# Patient Record
Sex: Male | Born: 1945 | Race: White | Hispanic: No | Marital: Married | Smoking: Current every day smoker
Health system: Southern US, Community
[De-identification: ages and names within clinical notes are randomized; demographics above are authoritative.]

## PROBLEM LIST (undated history)

## (undated) DIAGNOSIS — Z79891 Long term (current) use of opiate analgesic: Secondary | ICD-10-CM

## (undated) DIAGNOSIS — I209 Angina pectoris, unspecified: Secondary | ICD-10-CM

## (undated) DIAGNOSIS — I509 Heart failure, unspecified: Secondary | ICD-10-CM

## (undated) DIAGNOSIS — R0600 Dyspnea, unspecified: Secondary | ICD-10-CM

## (undated) DIAGNOSIS — D126 Benign neoplasm of colon, unspecified: Secondary | ICD-10-CM

## (undated) DIAGNOSIS — K648 Other hemorrhoids: Secondary | ICD-10-CM

## (undated) DIAGNOSIS — I700 Atherosclerosis of aorta: Secondary | ICD-10-CM

## (undated) DIAGNOSIS — J986 Disorders of diaphragm: Secondary | ICD-10-CM

## (undated) DIAGNOSIS — E039 Hypothyroidism, unspecified: Secondary | ICD-10-CM

## (undated) DIAGNOSIS — F329 Major depressive disorder, single episode, unspecified: Secondary | ICD-10-CM

## (undated) DIAGNOSIS — J45909 Unspecified asthma, uncomplicated: Secondary | ICD-10-CM

## (undated) DIAGNOSIS — K219 Gastro-esophageal reflux disease without esophagitis: Secondary | ICD-10-CM

## (undated) DIAGNOSIS — M064 Inflammatory polyarthropathy: Secondary | ICD-10-CM

## (undated) DIAGNOSIS — T402X5A Adverse effect of other opioids, initial encounter: Secondary | ICD-10-CM

## (undated) DIAGNOSIS — E785 Hyperlipidemia, unspecified: Secondary | ICD-10-CM

## (undated) DIAGNOSIS — G4730 Sleep apnea, unspecified: Secondary | ICD-10-CM

## (undated) DIAGNOSIS — E119 Type 2 diabetes mellitus without complications: Secondary | ICD-10-CM

## (undated) DIAGNOSIS — K5903 Drug induced constipation: Secondary | ICD-10-CM

## (undated) DIAGNOSIS — F419 Anxiety disorder, unspecified: Secondary | ICD-10-CM

## (undated) DIAGNOSIS — R52 Pain, unspecified: Secondary | ICD-10-CM

## (undated) DIAGNOSIS — N210 Calculus in bladder: Secondary | ICD-10-CM

## (undated) DIAGNOSIS — M810 Age-related osteoporosis without current pathological fracture: Secondary | ICD-10-CM

## (undated) DIAGNOSIS — M1990 Unspecified osteoarthritis, unspecified site: Secondary | ICD-10-CM

## (undated) DIAGNOSIS — J449 Chronic obstructive pulmonary disease, unspecified: Secondary | ICD-10-CM

## (undated) DIAGNOSIS — G479 Sleep disorder, unspecified: Secondary | ICD-10-CM

## (undated) DIAGNOSIS — I2510 Atherosclerotic heart disease of native coronary artery without angina pectoris: Secondary | ICD-10-CM

## (undated) DIAGNOSIS — I480 Paroxysmal atrial fibrillation: Secondary | ICD-10-CM

## (undated) DIAGNOSIS — I82403 Acute embolism and thrombosis of unspecified deep veins of lower extremity, bilateral: Secondary | ICD-10-CM

## (undated) DIAGNOSIS — C801 Malignant (primary) neoplasm, unspecified: Secondary | ICD-10-CM

## (undated) DIAGNOSIS — G629 Polyneuropathy, unspecified: Secondary | ICD-10-CM

## (undated) DIAGNOSIS — Z95828 Presence of other vascular implants and grafts: Secondary | ICD-10-CM

## (undated) DIAGNOSIS — M545 Low back pain, unspecified: Secondary | ICD-10-CM

## (undated) DIAGNOSIS — I499 Cardiac arrhythmia, unspecified: Secondary | ICD-10-CM

## (undated) DIAGNOSIS — Z8719 Personal history of other diseases of the digestive system: Secondary | ICD-10-CM

## (undated) DIAGNOSIS — G894 Chronic pain syndrome: Secondary | ICD-10-CM

## (undated) DIAGNOSIS — I739 Peripheral vascular disease, unspecified: Secondary | ICD-10-CM

## (undated) DIAGNOSIS — F32A Depression, unspecified: Secondary | ICD-10-CM

## (undated) DIAGNOSIS — G8929 Other chronic pain: Secondary | ICD-10-CM

## (undated) DIAGNOSIS — D692 Other nonthrombocytopenic purpura: Secondary | ICD-10-CM

## (undated) DIAGNOSIS — G4733 Obstructive sleep apnea (adult) (pediatric): Secondary | ICD-10-CM

## (undated) DIAGNOSIS — R001 Bradycardia, unspecified: Secondary | ICD-10-CM

## (undated) DIAGNOSIS — I10 Essential (primary) hypertension: Secondary | ICD-10-CM

## (undated) DIAGNOSIS — Z77098 Contact with and (suspected) exposure to other hazardous, chiefly nonmedicinal, chemicals: Secondary | ICD-10-CM

## (undated) DIAGNOSIS — I872 Venous insufficiency (chronic) (peripheral): Secondary | ICD-10-CM

## (undated) HISTORY — PX: INFUSION PUMP REPLACEMENT: SHX1825

## (undated) HISTORY — PX: CORONARY ANGIOPLASTY: SHX604

## (undated) HISTORY — PX: BLADDER TUMOR EXCISION: SHX238

## (undated) HISTORY — PX: COLON SURGERY: SHX602

## (undated) HISTORY — PX: BACK SURGERY: SHX140

## (undated) HISTORY — PX: MASTOIDECTOMY: SUR855

## (undated) HISTORY — PX: EXTERNAL EAR SURGERY: SHX627

## (undated) HISTORY — PX: PARTIAL COLECTOMY: SHX5273

## (undated) HISTORY — PX: INTRATHECAL PUMP IMPLANTATION: SHX1844

## (undated) HISTORY — PX: SKIN CANCER EXCISION: SHX779

## (undated) HISTORY — PX: CERVICAL FUSION: SHX112

## (undated) HISTORY — PX: NISSEN FUNDOPLICATION: SHX2091

## (undated) SURGERY — Surgical Case
Anesthesia: *Unknown

---
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
## 1982-07-22 HISTORY — PX: POSTERIOR LAMINECTOMY / DECOMPRESSION CERVICAL SPINE: SUR739

## 1983-07-23 HISTORY — PX: POSTERIOR LAMINECTOMY / DECOMPRESSION CERVICAL SPINE: SUR739

## 1987-04-18 HISTORY — PX: LEFT HEART CATH AND CORONARY ANGIOGRAPHY: CATH118249

## 1994-10-15 HISTORY — PX: COLONOSCOPY: SHX174

## 1997-07-22 HISTORY — PX: POSTERIOR LUMBAR FUSION: SHX6036

## 1998-07-22 DIAGNOSIS — I2109 ST elevation (STEMI) myocardial infarction involving other coronary artery of anterior wall: Secondary | ICD-10-CM

## 1998-07-22 DIAGNOSIS — I219 Acute myocardial infarction, unspecified: Secondary | ICD-10-CM

## 1998-07-22 HISTORY — PX: NISSEN FUNDOPLICATION: SHX2091

## 1998-07-22 HISTORY — DX: ST elevation (STEMI) myocardial infarction involving other coronary artery of anterior wall: I21.09

## 1998-07-22 HISTORY — DX: Acute myocardial infarction, unspecified: I21.9

## 1999-07-23 DIAGNOSIS — E119 Type 2 diabetes mellitus without complications: Secondary | ICD-10-CM | POA: Insufficient documentation

## 1999-07-23 DIAGNOSIS — E039 Hypothyroidism, unspecified: Secondary | ICD-10-CM | POA: Insufficient documentation

## 2002-02-03 HISTORY — PX: LEFT HEART CATH AND CORONARY ANGIOGRAPHY: CATH118249

## 2003-08-12 ENCOUNTER — Other Ambulatory Visit: Payer: Self-pay

## 2003-08-25 HISTORY — PX: COLONOSCOPY: SHX174

## 2004-05-01 ENCOUNTER — Ambulatory Visit: Payer: Self-pay | Admitting: Pain Medicine

## 2004-07-03 ENCOUNTER — Ambulatory Visit: Payer: Self-pay | Admitting: Pain Medicine

## 2004-07-19 ENCOUNTER — Ambulatory Visit: Payer: Self-pay | Admitting: Physician Assistant

## 2004-07-22 DIAGNOSIS — M6282 Rhabdomyolysis: Secondary | ICD-10-CM

## 2004-07-22 HISTORY — DX: Rhabdomyolysis: M62.82

## 2004-08-06 ENCOUNTER — Ambulatory Visit: Payer: Self-pay | Admitting: Physician Assistant

## 2004-08-29 ENCOUNTER — Ambulatory Visit: Payer: Self-pay | Admitting: Physician Assistant

## 2004-09-20 ENCOUNTER — Ambulatory Visit: Payer: Self-pay | Admitting: Pain Medicine

## 2004-10-08 ENCOUNTER — Ambulatory Visit: Payer: Self-pay | Admitting: Physician Assistant

## 2004-11-22 ENCOUNTER — Ambulatory Visit: Payer: Self-pay | Admitting: Pain Medicine

## 2004-12-03 ENCOUNTER — Other Ambulatory Visit: Payer: Self-pay

## 2004-12-03 ENCOUNTER — Inpatient Hospital Stay: Payer: Self-pay | Admitting: Internal Medicine

## 2004-12-03 DIAGNOSIS — I214 Non-ST elevation (NSTEMI) myocardial infarction: Secondary | ICD-10-CM

## 2004-12-03 HISTORY — DX: Non-ST elevation (NSTEMI) myocardial infarction: I21.4

## 2004-12-04 ENCOUNTER — Other Ambulatory Visit: Payer: Self-pay

## 2004-12-04 DIAGNOSIS — I4729 Other ventricular tachycardia: Secondary | ICD-10-CM

## 2004-12-04 HISTORY — PX: CORONARY ANGIOPLASTY WITH STENT PLACEMENT: SHX49

## 2004-12-04 HISTORY — DX: Other ventricular tachycardia: I47.29

## 2005-01-11 ENCOUNTER — Ambulatory Visit: Payer: Self-pay | Admitting: Gastroenterology

## 2005-02-05 ENCOUNTER — Ambulatory Visit: Payer: Self-pay | Admitting: Pain Medicine

## 2005-02-08 ENCOUNTER — Ambulatory Visit: Payer: Self-pay | Admitting: Physician Assistant

## 2005-02-28 ENCOUNTER — Ambulatory Visit: Payer: Self-pay | Admitting: Physician Assistant

## 2005-06-05 ENCOUNTER — Ambulatory Visit: Payer: Self-pay | Admitting: Physician Assistant

## 2005-06-06 ENCOUNTER — Ambulatory Visit: Payer: Self-pay | Admitting: Pain Medicine

## 2005-07-03 ENCOUNTER — Ambulatory Visit: Payer: Self-pay | Admitting: Physician Assistant

## 2005-08-01 ENCOUNTER — Ambulatory Visit: Payer: Self-pay | Admitting: Physician Assistant

## 2005-08-13 ENCOUNTER — Ambulatory Visit: Payer: Self-pay | Admitting: Pain Medicine

## 2005-10-10 ENCOUNTER — Ambulatory Visit: Payer: Self-pay | Admitting: Pain Medicine

## 2005-10-15 ENCOUNTER — Ambulatory Visit: Payer: Self-pay | Admitting: Physician Assistant

## 2005-10-21 ENCOUNTER — Ambulatory Visit: Payer: Self-pay | Admitting: Physician Assistant

## 2005-10-24 ENCOUNTER — Ambulatory Visit: Payer: Self-pay | Admitting: Physician Assistant

## 2005-10-29 ENCOUNTER — Ambulatory Visit: Payer: Self-pay | Admitting: Pain Medicine

## 2005-11-11 ENCOUNTER — Ambulatory Visit: Payer: Self-pay | Admitting: Pain Medicine

## 2005-11-12 ENCOUNTER — Ambulatory Visit: Payer: Self-pay | Admitting: Pain Medicine

## 2005-11-14 ENCOUNTER — Ambulatory Visit: Payer: Self-pay | Admitting: Pain Medicine

## 2005-11-28 ENCOUNTER — Ambulatory Visit: Payer: Self-pay | Admitting: Pain Medicine

## 2005-12-12 ENCOUNTER — Ambulatory Visit: Payer: Self-pay | Admitting: Pain Medicine

## 2005-12-23 ENCOUNTER — Ambulatory Visit: Payer: Self-pay | Admitting: Pain Medicine

## 2006-01-06 ENCOUNTER — Ambulatory Visit: Payer: Self-pay | Admitting: Pain Medicine

## 2006-01-14 ENCOUNTER — Ambulatory Visit: Payer: Self-pay | Admitting: Pain Medicine

## 2006-01-31 ENCOUNTER — Ambulatory Visit: Payer: Self-pay | Admitting: Pain Medicine

## 2006-03-03 ENCOUNTER — Ambulatory Visit: Payer: Self-pay | Admitting: Pain Medicine

## 2006-03-06 ENCOUNTER — Ambulatory Visit: Payer: Self-pay | Admitting: Pain Medicine

## 2006-04-17 ENCOUNTER — Other Ambulatory Visit: Payer: Self-pay

## 2006-04-17 ENCOUNTER — Inpatient Hospital Stay: Payer: Self-pay | Admitting: Internal Medicine

## 2006-04-30 ENCOUNTER — Ambulatory Visit: Payer: Self-pay | Admitting: Pain Medicine

## 2006-05-20 ENCOUNTER — Ambulatory Visit: Payer: Self-pay | Admitting: Pain Medicine

## 2006-07-02 ENCOUNTER — Ambulatory Visit: Payer: Self-pay | Admitting: Pain Medicine

## 2006-07-04 ENCOUNTER — Other Ambulatory Visit: Payer: Self-pay

## 2006-07-04 ENCOUNTER — Inpatient Hospital Stay: Payer: Self-pay | Admitting: Internal Medicine

## 2006-07-31 ENCOUNTER — Ambulatory Visit: Payer: Self-pay | Admitting: Pain Medicine

## 2006-09-15 ENCOUNTER — Ambulatory Visit: Payer: Self-pay | Admitting: Pain Medicine

## 2006-09-16 ENCOUNTER — Ambulatory Visit: Payer: Self-pay | Admitting: Pain Medicine

## 2006-09-18 ENCOUNTER — Ambulatory Visit: Payer: Self-pay | Admitting: Pain Medicine

## 2006-10-06 ENCOUNTER — Ambulatory Visit: Payer: Self-pay | Admitting: Pain Medicine

## 2006-10-20 ENCOUNTER — Emergency Department: Payer: Self-pay | Admitting: Emergency Medicine

## 2006-10-20 ENCOUNTER — Other Ambulatory Visit: Payer: Self-pay

## 2006-11-11 ENCOUNTER — Ambulatory Visit: Payer: Self-pay | Admitting: Pain Medicine

## 2006-12-08 ENCOUNTER — Ambulatory Visit: Payer: Self-pay | Admitting: Pain Medicine

## 2007-01-10 ENCOUNTER — Inpatient Hospital Stay: Payer: Self-pay | Admitting: Internal Medicine

## 2007-01-10 ENCOUNTER — Other Ambulatory Visit: Payer: Self-pay

## 2007-01-15 ENCOUNTER — Ambulatory Visit: Payer: Self-pay | Admitting: Pain Medicine

## 2007-02-09 ENCOUNTER — Ambulatory Visit: Payer: Self-pay | Admitting: Pain Medicine

## 2007-04-07 ENCOUNTER — Ambulatory Visit: Payer: Self-pay | Admitting: Pain Medicine

## 2007-06-08 ENCOUNTER — Inpatient Hospital Stay: Payer: Self-pay | Admitting: Pain Medicine

## 2007-07-07 ENCOUNTER — Ambulatory Visit: Payer: Self-pay | Admitting: Pain Medicine

## 2007-08-13 ENCOUNTER — Emergency Department: Payer: Self-pay | Admitting: Emergency Medicine

## 2007-08-19 ENCOUNTER — Ambulatory Visit: Payer: Self-pay | Admitting: Pain Medicine

## 2007-09-01 ENCOUNTER — Ambulatory Visit: Payer: Self-pay | Admitting: Pain Medicine

## 2007-09-14 ENCOUNTER — Ambulatory Visit: Payer: Self-pay | Admitting: Pain Medicine

## 2007-09-14 ENCOUNTER — Inpatient Hospital Stay: Payer: Self-pay | Admitting: Pain Medicine

## 2007-09-20 HISTORY — PX: IVC FILTER PLACEMENT (ARMC HX): HXRAD1551

## 2007-09-24 ENCOUNTER — Ambulatory Visit: Payer: Self-pay | Admitting: Pain Medicine

## 2007-09-30 ENCOUNTER — Ambulatory Visit: Payer: Self-pay | Admitting: Pain Medicine

## 2007-10-02 ENCOUNTER — Inpatient Hospital Stay: Payer: Self-pay | Admitting: Internal Medicine

## 2007-10-14 ENCOUNTER — Ambulatory Visit: Payer: Self-pay | Admitting: Pain Medicine

## 2007-11-04 ENCOUNTER — Ambulatory Visit: Payer: Self-pay | Admitting: Internal Medicine

## 2007-11-23 ENCOUNTER — Ambulatory Visit: Payer: Self-pay | Admitting: Pain Medicine

## 2007-11-23 ENCOUNTER — Emergency Department: Payer: Self-pay | Admitting: Emergency Medicine

## 2007-11-23 ENCOUNTER — Other Ambulatory Visit: Payer: Self-pay

## 2007-12-21 ENCOUNTER — Ambulatory Visit: Payer: Self-pay | Admitting: Pain Medicine

## 2008-01-13 ENCOUNTER — Ambulatory Visit: Payer: Self-pay | Admitting: Pain Medicine

## 2008-01-14 ENCOUNTER — Ambulatory Visit: Payer: Self-pay | Admitting: Pain Medicine

## 2008-02-17 ENCOUNTER — Ambulatory Visit: Payer: Self-pay | Admitting: Pain Medicine

## 2008-02-19 ENCOUNTER — Ambulatory Visit: Payer: Self-pay | Admitting: Internal Medicine

## 2008-02-19 HISTORY — PX: LEFT HEART CATH AND CORONARY ANGIOGRAPHY: CATH118249

## 2008-03-14 ENCOUNTER — Ambulatory Visit: Payer: Self-pay | Admitting: Pain Medicine

## 2008-03-22 HISTORY — PX: INTRATHECAL PUMP IMPLANTATION: SHX1844

## 2008-03-25 ENCOUNTER — Ambulatory Visit: Payer: Self-pay | Admitting: Pain Medicine

## 2008-03-29 ENCOUNTER — Ambulatory Visit: Payer: Self-pay | Admitting: Pain Medicine

## 2008-04-07 ENCOUNTER — Ambulatory Visit: Payer: Self-pay | Admitting: Pain Medicine

## 2008-04-13 ENCOUNTER — Ambulatory Visit: Payer: Self-pay | Admitting: Physician Assistant

## 2008-04-13 ENCOUNTER — Ambulatory Visit: Payer: Self-pay | Admitting: Pain Medicine

## 2008-04-18 ENCOUNTER — Ambulatory Visit: Payer: Self-pay | Admitting: Pain Medicine

## 2008-04-19 ENCOUNTER — Ambulatory Visit: Payer: Self-pay | Admitting: Physician Assistant

## 2008-04-21 ENCOUNTER — Ambulatory Visit: Payer: Self-pay | Admitting: Pain Medicine

## 2008-04-21 ENCOUNTER — Other Ambulatory Visit: Payer: Self-pay

## 2008-04-22 ENCOUNTER — Other Ambulatory Visit: Payer: Self-pay

## 2008-04-22 ENCOUNTER — Inpatient Hospital Stay: Payer: Self-pay | Admitting: Internal Medicine

## 2008-04-25 ENCOUNTER — Ambulatory Visit: Payer: Self-pay | Admitting: Pain Medicine

## 2008-04-26 ENCOUNTER — Ambulatory Visit: Payer: Self-pay | Admitting: Pain Medicine

## 2008-04-29 ENCOUNTER — Emergency Department: Payer: Self-pay | Admitting: Emergency Medicine

## 2008-04-29 ENCOUNTER — Other Ambulatory Visit: Payer: Self-pay

## 2008-05-02 ENCOUNTER — Ambulatory Visit: Payer: Self-pay | Admitting: Pain Medicine

## 2008-05-05 ENCOUNTER — Ambulatory Visit: Payer: Self-pay | Admitting: Pain Medicine

## 2008-05-12 ENCOUNTER — Ambulatory Visit: Payer: Self-pay | Admitting: Pain Medicine

## 2008-05-18 ENCOUNTER — Ambulatory Visit: Payer: Self-pay | Admitting: Pain Medicine

## 2008-05-19 ENCOUNTER — Ambulatory Visit: Payer: Self-pay | Admitting: Pain Medicine

## 2008-05-23 ENCOUNTER — Ambulatory Visit: Payer: Self-pay | Admitting: Pain Medicine

## 2008-05-25 ENCOUNTER — Ambulatory Visit: Payer: Self-pay | Admitting: Pain Medicine

## 2008-05-30 ENCOUNTER — Ambulatory Visit: Payer: Self-pay | Admitting: Pain Medicine

## 2008-06-01 ENCOUNTER — Ambulatory Visit: Payer: Self-pay | Admitting: Pain Medicine

## 2008-06-02 ENCOUNTER — Emergency Department: Payer: Self-pay

## 2008-06-06 ENCOUNTER — Ambulatory Visit: Payer: Self-pay | Admitting: Pain Medicine

## 2008-06-08 ENCOUNTER — Ambulatory Visit: Payer: Self-pay | Admitting: Pain Medicine

## 2008-06-20 ENCOUNTER — Ambulatory Visit: Payer: Self-pay | Admitting: Pain Medicine

## 2008-06-23 ENCOUNTER — Ambulatory Visit: Payer: Self-pay | Admitting: Pain Medicine

## 2008-06-29 ENCOUNTER — Ambulatory Visit: Payer: Self-pay | Admitting: Pain Medicine

## 2008-07-27 ENCOUNTER — Ambulatory Visit: Payer: Self-pay | Admitting: Pain Medicine

## 2008-08-03 ENCOUNTER — Ambulatory Visit: Payer: Self-pay | Admitting: Pain Medicine

## 2008-08-06 ENCOUNTER — Emergency Department: Payer: Self-pay | Admitting: Emergency Medicine

## 2008-09-06 ENCOUNTER — Ambulatory Visit: Payer: Self-pay | Admitting: Unknown Physician Specialty

## 2008-09-07 ENCOUNTER — Ambulatory Visit: Payer: Self-pay | Admitting: Pain Medicine

## 2008-09-19 ENCOUNTER — Ambulatory Visit: Payer: Self-pay | Admitting: Unknown Physician Specialty

## 2008-09-19 HISTORY — PX: COLONOSCOPY: SHX174

## 2008-11-28 ENCOUNTER — Ambulatory Visit: Payer: Self-pay | Admitting: Pain Medicine

## 2009-01-09 ENCOUNTER — Ambulatory Visit: Payer: Self-pay | Admitting: Pain Medicine

## 2009-01-13 ENCOUNTER — Ambulatory Visit: Payer: Self-pay | Admitting: Pain Medicine

## 2009-01-13 ENCOUNTER — Other Ambulatory Visit: Payer: Self-pay | Admitting: Pain Medicine

## 2009-01-30 ENCOUNTER — Ambulatory Visit: Payer: Self-pay | Admitting: Physician Assistant

## 2009-02-01 ENCOUNTER — Inpatient Hospital Stay: Payer: Self-pay | Admitting: Internal Medicine

## 2009-03-06 ENCOUNTER — Ambulatory Visit: Payer: Self-pay | Admitting: Pain Medicine

## 2009-04-13 ENCOUNTER — Ambulatory Visit: Payer: Self-pay | Admitting: Physician Assistant

## 2009-04-18 ENCOUNTER — Ambulatory Visit: Payer: Self-pay | Admitting: Pain Medicine

## 2009-04-19 ENCOUNTER — Ambulatory Visit: Payer: Self-pay | Admitting: Pain Medicine

## 2009-05-02 ENCOUNTER — Ambulatory Visit: Payer: Self-pay | Admitting: Pain Medicine

## 2009-05-24 ENCOUNTER — Ambulatory Visit: Payer: Self-pay | Admitting: Pain Medicine

## 2009-05-31 ENCOUNTER — Ambulatory Visit: Payer: Self-pay | Admitting: Internal Medicine

## 2009-06-26 ENCOUNTER — Ambulatory Visit: Payer: Self-pay | Admitting: Physician Assistant

## 2009-07-04 ENCOUNTER — Ambulatory Visit: Payer: Self-pay | Admitting: Gastroenterology

## 2009-07-22 HISTORY — PX: IVC FILTER REMOVAL: CATH118246

## 2009-08-02 ENCOUNTER — Ambulatory Visit: Payer: Self-pay | Admitting: Gastroenterology

## 2009-08-15 ENCOUNTER — Ambulatory Visit: Payer: Self-pay | Admitting: Pain Medicine

## 2009-08-28 ENCOUNTER — Ambulatory Visit: Payer: Self-pay | Admitting: Pain Medicine

## 2009-10-18 ENCOUNTER — Ambulatory Visit: Payer: Self-pay | Admitting: Pain Medicine

## 2009-11-07 ENCOUNTER — Ambulatory Visit: Payer: Self-pay | Admitting: Internal Medicine

## 2009-12-14 ENCOUNTER — Ambulatory Visit: Payer: Self-pay | Admitting: Pain Medicine

## 2009-12-19 ENCOUNTER — Ambulatory Visit: Payer: Self-pay | Admitting: Pain Medicine

## 2009-12-26 ENCOUNTER — Ambulatory Visit: Payer: Self-pay | Admitting: Pain Medicine

## 2010-01-15 ENCOUNTER — Ambulatory Visit: Payer: Self-pay | Admitting: Vascular Surgery

## 2010-02-08 ENCOUNTER — Ambulatory Visit: Payer: Self-pay | Admitting: Pain Medicine

## 2010-03-29 ENCOUNTER — Emergency Department: Payer: Self-pay | Admitting: Emergency Medicine

## 2011-03-30 ENCOUNTER — Emergency Department: Payer: Self-pay | Admitting: Emergency Medicine

## 2011-10-30 ENCOUNTER — Ambulatory Visit: Payer: Self-pay | Admitting: Pain Medicine

## 2011-11-07 ENCOUNTER — Ambulatory Visit: Payer: Self-pay | Admitting: Pain Medicine

## 2012-01-30 ENCOUNTER — Ambulatory Visit: Payer: Self-pay | Admitting: Pain Medicine

## 2012-02-18 DIAGNOSIS — F419 Anxiety disorder, unspecified: Secondary | ICD-10-CM | POA: Insufficient documentation

## 2012-02-18 DIAGNOSIS — G4750 Parasomnia, unspecified: Secondary | ICD-10-CM | POA: Insufficient documentation

## 2012-02-18 DIAGNOSIS — G894 Chronic pain syndrome: Secondary | ICD-10-CM | POA: Insufficient documentation

## 2012-02-18 DIAGNOSIS — M961 Postlaminectomy syndrome, not elsewhere classified: Secondary | ICD-10-CM | POA: Insufficient documentation

## 2012-03-30 DIAGNOSIS — F17200 Nicotine dependence, unspecified, uncomplicated: Secondary | ICD-10-CM | POA: Insufficient documentation

## 2012-04-21 DIAGNOSIS — F1190 Opioid use, unspecified, uncomplicated: Secondary | ICD-10-CM | POA: Insufficient documentation

## 2012-08-03 DIAGNOSIS — I10 Essential (primary) hypertension: Secondary | ICD-10-CM | POA: Insufficient documentation

## 2012-08-10 ENCOUNTER — Emergency Department: Payer: Self-pay | Admitting: Unknown Physician Specialty

## 2012-08-10 LAB — PROTIME-INR: INR: 2.5

## 2013-11-18 ENCOUNTER — Ambulatory Visit: Payer: Self-pay | Admitting: Podiatry

## 2013-11-22 ENCOUNTER — Ambulatory Visit: Payer: Self-pay | Admitting: Unknown Physician Specialty

## 2013-11-22 HISTORY — PX: COLONOSCOPY: SHX174

## 2013-11-24 LAB — PATHOLOGY REPORT

## 2013-12-10 ENCOUNTER — Ambulatory Visit: Payer: Self-pay | Admitting: Podiatry

## 2014-04-15 DIAGNOSIS — Z978 Presence of other specified devices: Secondary | ICD-10-CM | POA: Insufficient documentation

## 2014-09-28 HISTORY — PX: INTRATHECAL PUMP REVISION: SHX6810

## 2014-11-25 ENCOUNTER — Other Ambulatory Visit: Payer: Self-pay | Admitting: Internal Medicine

## 2014-11-25 DIAGNOSIS — R140 Abdominal distension (gaseous): Secondary | ICD-10-CM

## 2014-11-25 DIAGNOSIS — R1013 Epigastric pain: Secondary | ICD-10-CM

## 2015-01-04 ENCOUNTER — Other Ambulatory Visit: Payer: Self-pay | Admitting: Urology

## 2015-01-04 DIAGNOSIS — R310 Gross hematuria: Secondary | ICD-10-CM

## 2015-01-10 ENCOUNTER — Ambulatory Visit
Admission: RE | Admit: 2015-01-10 | Discharge: 2015-01-10 | Disposition: A | Discharge status: Home / Self Care | Payer: Medicare Other | Source: Ambulatory Visit | Attending: Urology | Admitting: Urology

## 2015-01-10 DIAGNOSIS — R310 Gross hematuria: Secondary | ICD-10-CM | POA: Insufficient documentation

## 2015-01-10 DIAGNOSIS — R300 Dysuria: Secondary | ICD-10-CM | POA: Diagnosis not present

## 2015-01-10 HISTORY — DX: Essential (primary) hypertension: I10

## 2015-01-10 HISTORY — DX: Type 2 diabetes mellitus without complications: E11.9

## 2015-01-10 HISTORY — DX: Unspecified asthma, uncomplicated: J45.909

## 2015-01-10 MED ORDER — IOHEXOL 350 MG/ML SOLN
125.0000 mL | Freq: Once | INTRAVENOUS | Status: AC | PRN
  Administered 2015-01-10: 125 mL via INTRAVENOUS

## 2015-03-01 ENCOUNTER — Inpatient Hospital Stay: Admission: RE | Admit: 2015-03-01 | Payer: Medicare Other | Source: Ambulatory Visit

## 2015-03-02 ENCOUNTER — Encounter
Admission: RE | Admit: 2015-03-02 | Discharge: 2015-03-02 | Disposition: A | Discharge status: Home / Self Care | Payer: Medicare Other | Source: Ambulatory Visit | Attending: Urology | Admitting: Urology

## 2015-03-02 DIAGNOSIS — Z01812 Encounter for preprocedural laboratory examination: Secondary | ICD-10-CM | POA: Insufficient documentation

## 2015-03-02 DIAGNOSIS — Z01810 Encounter for preprocedural cardiovascular examination: Secondary | ICD-10-CM | POA: Insufficient documentation

## 2015-03-02 HISTORY — DX: Major depressive disorder, single episode, unspecified: F32.9

## 2015-03-02 HISTORY — DX: Heart failure, unspecified: I50.9

## 2015-03-02 HISTORY — DX: Depression, unspecified: F32.A

## 2015-03-02 HISTORY — DX: Presence of other vascular implants and grafts: Z95.828

## 2015-03-02 HISTORY — DX: Anxiety disorder, unspecified: F41.9

## 2015-03-02 HISTORY — DX: Acute embolism and thrombosis of unspecified deep veins of lower extremity, bilateral: I82.403

## 2015-03-02 HISTORY — DX: Peripheral vascular disease, unspecified: I73.9

## 2015-03-02 HISTORY — DX: Venous insufficiency (chronic) (peripheral): I87.2

## 2015-03-02 HISTORY — DX: Atherosclerotic heart disease of native coronary artery without angina pectoris: I25.10

## 2015-03-02 HISTORY — DX: Sleep disorder, unspecified: G47.9

## 2015-03-02 HISTORY — DX: Sleep apnea, unspecified: G47.30

## 2015-03-02 HISTORY — DX: Pain, unspecified: R52

## 2015-03-02 HISTORY — DX: Unspecified osteoarthritis, unspecified site: M19.90

## 2015-03-02 HISTORY — DX: Chronic obstructive pulmonary disease, unspecified: J44.9

## 2015-03-02 HISTORY — DX: Cardiac arrhythmia, unspecified: I49.9

## 2015-03-02 HISTORY — DX: Polyneuropathy, unspecified: G62.9

## 2015-03-02 LAB — BASIC METABOLIC PANEL
Anion gap: 9 (ref 5–15)
BUN: 15 mg/dL (ref 6–20)
CHLORIDE: 103 mmol/L (ref 101–111)
CO2: 28 mmol/L (ref 22–32)
Calcium: 9 mg/dL (ref 8.9–10.3)
Creatinine, Ser: 0.85 mg/dL (ref 0.61–1.24)
GFR calc Af Amer: >60 mL/min (ref >60)
GFR calc non Af Amer: >60 mL/min (ref >60)
GLUCOSE: 100 mg/dL — AB (ref 65–99)
POTASSIUM: 4.3 mmol/L (ref 3.5–5.1)
Sodium: 140 mmol/L (ref 135–145)

## 2015-03-02 LAB — CBC
HCT: 43.8 % (ref 40.0–52.0)
HEMOGLOBIN: 14.2 g/dL (ref 13.0–18.0)
MCH: 28.6 pg (ref 26.0–34.0)
MCHC: 32.5 g/dL (ref 32.0–36.0)
MCV: 88 fL (ref 80.0–100.0)
Platelets: 186 10*3/uL (ref 150–440)
RBC: 4.97 MIL/uL (ref 4.40–5.90)
RDW: 14.9 % — AB (ref 11.5–14.5)
WBC: 11.4 10*3/uL — ABNORMAL HIGH (ref 3.8–10.6)

## 2015-03-02 NOTE — OR Nursing (Signed)
CLEARED BY DR Clayborn Bigness

## 2015-03-02 NOTE — Patient Instructions (Addendum)
  Your procedure is scheduled on: 03/07/15 Report to Day Surgery. [facility] SECOND FLOOR To find out your arrival time please call [phone] between 1PM - 3PM on 03/06/15.  Remember: Instructions that are not followed completely may result in serious medical risk, up to and including death, or upon the discretion of your surgeon and anesthesiologist your surgery may need to be rescheduled.    _X___ 1. Do not eat food or drink liquids after midnight. No gum chewing or hard candies.     __X__ 2. No Alcohol for 24 hours before or after surgery.   ____ 3. Bring all medications with you on the day of surgery if instructed.    __X__ 4. Notify your doctor if there is any change in your medical condition     (cold, fever, infections).     Do not wear jewelry, make-up, hairpins, clips or nail polish.  Do not wear lotions, powders, or perfumes. You may wear deodorant.  Do not shave 48 hours prior to surgery. Men may shave face and neck.  Do not bring valuables to the hospital.    [facility] is not responsible for any belongings or valuables.               Contacts, dentures or bridgework may not be worn into surgery.  Leave your suitcase in the car. After surgery it may be brought to your room.  For patients admitted to the hospital, discharge time is determined by your                treatment team.   Patients discharged the day of surgery will not be allowed to drive home.   Please read over the following fact sheets that you were given:   Surgical Site Infection Prevention   ____ Take these medicines the morning of surgery with A SIP OF WATER:    1. SIMVASTATIN  2. ATENOLOL  3. GABAPENTIN  4.LEVOTHYROXINE  5.VALIUM              6. MAGNESIUM   6.  ____ Fleet Enema (as directed)   ____ Use CHG Soap as directed  ____ Use inhalers on the day of surgery  ____ Stop metformin 2 days prior to surgery    ____ Take 1/2 of usual insulin dose the night before surgery and none on  the morning of surgery.   _X___ Stop Coumadin/Plavix/aspirin on   COUMADIN ALREADY STOPPED  ____ Stop Anti-inflammatories on    _X___ Stop supplements until after surgery.  STOP FISH OIL UNTIL AFTER SURGERY   ____ Bring C-Pap to the hospital.

## 2015-03-03 NOTE — H&P (Deleted)
NAMEDAIVEON, MARKMAN                 ACCOUNT NO.:  [id number]  MEDICAL RECORD NO.:  [id number]  LOCATION:  PAT                          FACILITY:  [facility]  PHYSICIAN:  Maryan Puls          DATE OF BIRTH:  01/18/1946  DATE OF ADMISSION:  03/02/2015 DATE OF DISCHARGE:  03/02/2015                            HISTORY AND PHYSICAL   Same Day Surgery, March 07, 2015.  CHIEF COMPLAINT:  Bladder cancer.  HISTORY OF PRESENT ILLNESS:  Mr. Skelton is a 69 year old Caucasian male, who presented with gross hematuria and was evaluated with CT scan revealing a 5-mm left renal calculus.  He also had cystoscopy on January 16, 2015, which revealed a 10 x 10-mm papillary bladder tumor just lateral to the right ureteral orifice.  The patient comes in now for transurethral resection of bladder tumor.  The patient was referred to Dr. Lujean Amel for cardiac clearance and Dr. Clayborn Bigness has cleared him for this procedure.  PAST MEDICAL HISTORY:  The patient was allergic to penicillin, codeine and opiates.  CURRENT MEDICATIONS:  Included Cymbalta, Coumadin, gabapentin, atenolol, simvastatin and baclofen.  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  SOCIAL HISTORY:  The patient smokes a pack a day and has a 50-pack-year history.  He denied alcohol use.  FAMILY HISTORY:  Father died at age 87 of heart disease and emphysema. Mother died at age 1 of stomach cancer.  Brother died at age 65 of heart disease.  He has another brother at age 33, who has heart disease, but is still alive.  He has a sister, who is alive at age 15 with heart disease.  PAST AND CURRENT MEDICAL CONDITIONS: 1. Coronary artery disease, status post cardiac stent placement in     1990s and ejection fraction of 40%. 2. Hypertension. 3. Depression. 4. Chronic pain syndrome with  chronic back pain and neck pain. 5. Anxiety. 6. Hyperlipidemia. 7. Atrial fibrillation. 8. Hypothyroidism. 9. COPD. 10.Sleep apnea. 11.Osteoarthritis. 12.History of DVT, status post IVC filter placement in 2009 and     removal of IVC filter in 2011. 13.GERD.  REVIEW OF SYSTEMS:  The patient has decreased visual and auditory acuity.  He has history of allergic rhinitis.  He has chronic constipation and nausea.  He has heat and cold intolerance.  He has numbness and tingling of his lower extremities.  He has history of headaches.  PHYSICAL EXAMINATION:  GENERAL:  Chronically ill-appearing white male, in no acute distress. HEENT:  Sclerae were clear. NECK:  Supple.  No palpable cervical adenopathy. LUNGS:  Clear to auscultation. CARDIOVASCULAR:  Regular rhythm and rate without audible murmurs. ABDOMEN:  Soft and nontender abdomen. GU:  The patient was uncircumcised.  Testes, smooth and nontender, 18 mL in size each. RECTAL:  A 25 g prostate with TURP defect. NEUROMUSCULAR:  Alert and oriented x3.  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  PLAN:  Transurethral resection of bladder tumor.          ______________________________  Maryan Puls     MW/MEDQ  D:  03/02/2015  T:  03/03/2015  Job:  [id number]

## 2015-03-03 NOTE — H&P (Signed)
[name], [name]                 ACCOUNT NO.:  [id number]  MEDICAL RECORD NO.:  [id number]  LOCATION:  [facility]                         FACILITY:  [facility]  PHYSICIAN:  Maryan Puls          DATE OF BIRTH:  [birth year]  DATE OF ADMISSION:  03/07/2015 DATE OF DISCHARGE:  03/07/2015                            HISTORY AND PHYSICAL   Same Day Surgery, March 07, 2015.  CHIEF COMPLAINT:  Bladder cancer.  HISTORY OF PRESENT ILLNESS:  [name] is a 69 year old Caucasian male, who presented with gross hematuria and was evaluated with CT scan revealing a 5-mm left renal calculus.  He also had cystoscopy on January 16, 2015, which revealed a 10 x 10-mm papillary bladder tumor just lateral to the right ureteral orifice.  The patient comes in now for transurethral resection of bladder tumor.  The patient was referred to Dr. Lujean Amel for cardiac clearance and Dr. Clayborn Bigness has cleared him for this procedure.  PAST MEDICAL HISTORY:  The patient was allergic to penicillin, codeine and opiates.  CURRENT MEDICATIONS:  Included Cymbalta, Coumadin, gabapentin, atenolol, simvastatin and baclofen.  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  SOCIAL HISTORY:  The patient smokes a pack a day and has a 50-pack-year history.  He denied alcohol use.  FAMILY HISTORY:  Father died at age 68 of heart disease and emphysema. Mother died at age 55 of stomach cancer.  Brother died at age 19 of heart disease.  He has another brother at age 16, who has heart disease, but is still alive.  He has a sister, who is alive at age 49 with heart disease.  PAST AND CURRENT MEDICAL CONDITIONS: 1. Coronary artery disease, status post cardiac stent placement in     1990s and ejection fraction of 40%. 2. Hypertension. 3. Depression. 4. Chronic pain syndrome with  chronic back pain and neck pain. 5. Anxiety. 6. Hyperlipidemia. 7. Atrial fibrillation. 8. Hypothyroidism. 9. COPD. 10.Sleep apnea. 11.Osteoarthritis. 12.History of DVT, status post IVC filter placement in 2009 and     removal of IVC filter in 2011. 13.GERD.  REVIEW OF SYSTEMS:  The patient has decreased visual and auditory acuity.  He has history of allergic rhinitis.  He has chronic constipation and nausea.  He has heat and cold intolerance.  He has numbness and tingling of his lower extremities.  He has history of headaches.  PHYSICAL EXAMINATION:  GENERAL:  Chronically ill-appearing white male, in no acute distress. HEENT:  Sclerae were clear. NECK:  Supple.  No palpable cervical adenopathy. LUNGS:  Clear to auscultation. CARDIOVASCULAR:  Regular rhythm and rate without audible murmurs. ABDOMEN:  Soft and nontender abdomen. GU:  The patient was uncircumcised.  Testes, smooth and nontender, 18 mL in size each. RECTAL:  A 25 g prostate with TURP defect. NEUROMUSCULAR:  Alert and oriented x3.  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  PLAN:  Transurethral resection of bladder tumor.          ______________________________ Legrand Como  Yves Dill     MW/MEDQ  D:  03/02/2015  T:  03/03/2015  Job:  [id number]

## 2015-03-07 ENCOUNTER — Ambulatory Visit
Admission: RE | Admit: 2015-03-07 | Discharge: 2015-03-07 | Disposition: A | Discharge status: Home / Self Care | Payer: Medicare Other | Source: Ambulatory Visit | Attending: Urology | Admitting: Urology

## 2015-03-07 ENCOUNTER — Encounter
Admission: RE | Disposition: A | Discharge status: Home / Self Care | Payer: Self-pay | Source: Ambulatory Visit | Attending: Urology

## 2015-03-07 ENCOUNTER — Ambulatory Visit: Payer: Medicare Other | Admitting: Anesthesiology

## 2015-03-07 ENCOUNTER — Encounter: Payer: Self-pay | Admitting: *Deleted

## 2015-03-07 DIAGNOSIS — M1990 Unspecified osteoarthritis, unspecified site: Secondary | ICD-10-CM | POA: Diagnosis not present

## 2015-03-07 DIAGNOSIS — N289 Disorder of kidney and ureter, unspecified: Secondary | ICD-10-CM | POA: Diagnosis not present

## 2015-03-07 DIAGNOSIS — Z79899 Other long term (current) drug therapy: Secondary | ICD-10-CM | POA: Diagnosis not present

## 2015-03-07 DIAGNOSIS — E119 Type 2 diabetes mellitus without complications: Secondary | ICD-10-CM | POA: Insufficient documentation

## 2015-03-07 DIAGNOSIS — I10 Essential (primary) hypertension: Secondary | ICD-10-CM | POA: Diagnosis not present

## 2015-03-07 DIAGNOSIS — F418 Other specified anxiety disorders: Secondary | ICD-10-CM | POA: Insufficient documentation

## 2015-03-07 DIAGNOSIS — Z9981 Dependence on supplemental oxygen: Secondary | ICD-10-CM | POA: Diagnosis not present

## 2015-03-07 DIAGNOSIS — Z951 Presence of aortocoronary bypass graft: Secondary | ICD-10-CM | POA: Insufficient documentation

## 2015-03-07 DIAGNOSIS — I252 Old myocardial infarction: Secondary | ICD-10-CM | POA: Insufficient documentation

## 2015-03-07 DIAGNOSIS — I2510 Atherosclerotic heart disease of native coronary artery without angina pectoris: Secondary | ICD-10-CM | POA: Insufficient documentation

## 2015-03-07 DIAGNOSIS — G4730 Sleep apnea, unspecified: Secondary | ICD-10-CM | POA: Diagnosis not present

## 2015-03-07 DIAGNOSIS — J449 Chronic obstructive pulmonary disease, unspecified: Secondary | ICD-10-CM | POA: Insufficient documentation

## 2015-03-07 DIAGNOSIS — C674 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: Secondary | ICD-10-CM

## 2015-03-07 DIAGNOSIS — C679 Malignant neoplasm of bladder, unspecified: Secondary | ICD-10-CM | POA: Diagnosis not present

## 2015-03-07 HISTORY — PX: TRANSURETHRAL RESECTION OF BLADDER TUMOR: SHX2575

## 2015-03-07 LAB — GLUCOSE, CAPILLARY
GLUCOSE-CAPILLARY: 110 mg/dL — AB (ref 65–99)
Glucose-Capillary: 96 mg/dL (ref 65–99)

## 2015-03-07 LAB — PROTIME-INR
INR: 0.96
PROTHROMBIN TIME: 13 s (ref 11.4–15.0)

## 2015-03-07 SURGERY — TURBT (TRANSURETHRAL RESECTION OF BLADDER TUMOR)
Anesthesia: General | Wound class: Clean Contaminated

## 2015-03-07 MED ORDER — FAMOTIDINE 20 MG PO TABS
ORAL_TABLET | ORAL | Status: AC
  Filled 2015-03-07: qty 1

## 2015-03-07 MED ORDER — FENTANYL CITRATE (PF) 100 MCG/2ML IJ SOLN: 25.0000 ug | INTRAMUSCULAR | Status: DC | PRN

## 2015-03-07 MED ORDER — MIDAZOLAM HCL 5 MG/5ML IJ SOLN
INTRAMUSCULAR | Status: DC | PRN
  Administered 2015-03-07: 1 mg via INTRAVENOUS

## 2015-03-07 MED ORDER — DIPHENHYDRAMINE HCL 50 MG/ML IJ SOLN
INTRAMUSCULAR | Status: DC | PRN
  Administered 2015-03-07: 25 mg via INTRAVENOUS

## 2015-03-07 MED ORDER — ONDANSETRON HCL 4 MG/2ML IJ SOLN
INTRAMUSCULAR | Status: DC | PRN
  Administered 2015-03-07: 4 mg via INTRAVENOUS

## 2015-03-07 MED ORDER — SUCCINYLCHOLINE CHLORIDE 20 MG/ML IJ SOLN
INTRAMUSCULAR | Status: DC | PRN
  Administered 2015-03-07: 120 mg via INTRAVENOUS

## 2015-03-07 MED ORDER — PROPOFOL 10 MG/ML IV BOLUS
INTRAVENOUS | Status: DC | PRN
  Administered 2015-03-07: 170 mg via INTRAVENOUS

## 2015-03-07 MED ORDER — LIDOCAINE HCL 2 % EX GEL
CUTANEOUS | Status: AC
  Filled 2015-03-07: qty 10

## 2015-03-07 MED ORDER — GENTAMICIN SULFATE 40 MG/ML IJ SOLN
INTRAMUSCULAR | Status: AC
  Filled 2015-03-07: qty 2

## 2015-03-07 MED ORDER — EPHEDRINE SULFATE 50 MG/ML IJ SOLN
INTRAMUSCULAR | Status: DC | PRN
  Administered 2015-03-07: 5 mg via INTRAVENOUS
  Administered 2015-03-07: 10 mg via INTRAVENOUS

## 2015-03-07 MED ORDER — LEVOFLOXACIN IN D5W 500 MG/100ML IV SOLN
INTRAVENOUS | Status: AC
  Administered 2015-03-07: 500 mg via INTRAVENOUS
  Filled 2015-03-07: qty 100

## 2015-03-07 MED ORDER — ROCURONIUM BROMIDE 100 MG/10ML IV SOLN
INTRAVENOUS | Status: DC | PRN
  Administered 2015-03-07: 5 mg via INTRAVENOUS

## 2015-03-07 MED ORDER — FENTANYL CITRATE (PF) 100 MCG/2ML IJ SOLN
INTRAMUSCULAR | Status: DC | PRN
  Administered 2015-03-07: 100 ug via INTRAVENOUS

## 2015-03-07 MED ORDER — FAMOTIDINE 20 MG PO TABS
20.0000 mg | ORAL_TABLET | Freq: Once | ORAL | Status: AC
  Administered 2015-03-07: 20 mg via ORAL

## 2015-03-07 MED ORDER — MITOMYCIN CHEMO FOR BLADDER INSTILLATION 40 MG
INTRAVENOUS | Status: DC | PRN
  Administered 2015-03-07: 40 mg via INTRAVESICAL

## 2015-03-07 MED ORDER — BELLADONNA ALKALOIDS-OPIUM 16.2-60 MG RE SUPP
RECTAL | Status: DC | PRN
  Administered 2015-03-07: 1 via RECTAL

## 2015-03-07 MED ORDER — LEVOFLOXACIN IN D5W 500 MG/100ML IV SOLN
500.0000 mg | INTRAVENOUS | Status: AC
  Administered 2015-03-07: 500 mg via INTRAVENOUS

## 2015-03-07 MED ORDER — LIDOCAINE HCL 2 % EX GEL
CUTANEOUS | Status: DC | PRN
  Administered 2015-03-07: 1

## 2015-03-07 MED ORDER — ONDANSETRON HCL 4 MG/2ML IJ SOLN
4.0000 mg | Freq: Once | INTRAMUSCULAR | Status: DC | PRN
Start: 2015-03-07 — End: 2015-03-07

## 2015-03-07 MED ORDER — DOCUSATE SODIUM 100 MG PO CAPS: 200.0000 mg | ORAL_CAPSULE | Freq: Two times a day (BID) | ORAL | Status: DC

## 2015-03-07 MED ORDER — GENTAMICIN SULFATE 40 MG/ML IJ SOLN
162.0000 mg | INTRAVENOUS | Status: DC | PRN
  Administered 2015-03-07: 80 mg via INTRAVENOUS

## 2015-03-07 MED ORDER — LIDOCAINE HCL (CARDIAC) 20 MG/ML IV SOLN
INTRAVENOUS | Status: DC | PRN
  Administered 2015-03-07: 60 mg via INTRAVENOUS

## 2015-03-07 MED ORDER — BELLADONNA ALKALOIDS-OPIUM 16.2-60 MG RE SUPP
RECTAL | Status: AC
  Filled 2015-03-07: qty 1

## 2015-03-07 MED ORDER — UROGESIC-BLUE 81.6 MG PO TABS: 1.0000 | ORAL_TABLET | Freq: Four times a day (QID) | ORAL | Status: DC | PRN

## 2015-03-07 MED ORDER — SODIUM CHLORIDE 0.9 % IV SOLN
INTRAVENOUS | Status: DC
  Administered 2015-03-07: 12:00:00 via INTRAVENOUS

## 2015-03-07 SURGICAL SUPPLY — 20 items
BAG DRAIN CYSTO-URO LG1000N (MISCELLANEOUS) ×3 IMPLANT
BAG URO DRAIN 2000ML W/SPOUT (MISCELLANEOUS) ×3 IMPLANT
CATH FOLEY 2WAY  5CC 20FR SIL (CATHETERS) ×2
CATH FOLEY 2WAY 5CC 20FR SIL (CATHETERS) ×1 IMPLANT
ELECT RESECT LOOP CUT 26F (MISCELLANEOUS) ×3 IMPLANT
ELECT RESECT POWERBALL 24F (MISCELLANEOUS) ×3 IMPLANT
GLOVE BIO SURGEON STRL SZ7 (GLOVE) ×6 IMPLANT
GLOVE BIO SURGEON STRL SZ7.5 (GLOVE) ×3 IMPLANT
GOWN STRL REUS W/ TWL LRG LVL3 (GOWN DISPOSABLE) ×1 IMPLANT
GOWN STRL REUS W/ TWL XL LVL3 (GOWN DISPOSABLE) ×1 IMPLANT
GOWN STRL REUS W/TWL LRG LVL3 (GOWN DISPOSABLE) ×2
GOWN STRL REUS W/TWL XL LVL3 (GOWN DISPOSABLE) ×2
KIT RM TURNOVER CYSTO AR (KITS) ×3 IMPLANT
PACK CYSTO AR (MISCELLANEOUS) ×3 IMPLANT
PAD GROUND ADULT SPLIT (MISCELLANEOUS) ×3 IMPLANT
PREP PVP WINGED SPONGE (MISCELLANEOUS) ×3 IMPLANT
SET IRRIG Y TYPE TUR BLADDER L (SET/KITS/TRAYS/PACK) ×3 IMPLANT
SYRINGE IRR TOOMEY STRL 70CC (SYRINGE) ×3 IMPLANT
WATER STERILE IRR 1000ML POUR (IV SOLUTION) ×3 IMPLANT
WATER STERILE IRR 3000ML UROMA (IV SOLUTION) ×6 IMPLANT

## 2015-03-07 NOTE — OR Nursing (Signed)
150 cc mitomycin drained from foley at 1440

## 2015-03-07 NOTE — Anesthesia Procedure Notes (Signed)
Procedure Name: Intubation Date/Time: 03/07/2015 1:04 PM Performed by: Dionne Bucy Pre-anesthesia Checklist: Patient identified, Patient being monitored, Timeout performed, Emergency Drugs available and Suction available Patient Re-evaluated:Patient Re-evaluated prior to inductionOxygen Delivery Method: Circle system utilized Preoxygenation: Pre-oxygenation with 100% oxygen Intubation Type: IV induction Ventilation: Two handed mask ventilation required Laryngoscope Size: Mac and 4 Grade View: Grade III Tube type: Oral Tube size: 7.5 mm Number of attempts: 1 Airway Equipment and Method: Stylet Placement Confirmation: ETT inserted through vocal cords under direct vision,  positive ETCO2 and breath sounds checked- equal and bilateral Secured at: 23 cm Tube secured with: Tape Dental Injury: Teeth and Oropharynx as per pre-operative assessment

## 2015-03-07 NOTE — H&P (Signed)
Date of Initial H&P: 03/02/15  History reviewed, patient examined, no change in status, stable for surgery.

## 2015-03-07 NOTE — Op Note (Signed)
Preoperative diagnosis: Bladder cancer Postoperative diagnosis: Same  Procedure: 1. Transurethral resection of bladder tumor                      2. Instillation of mitomycin-C into the bladder                      3. Foley catheter placement   Surgeon: Otelia Limes. Yves Dill MD, FACS Anesthesia: Gen.  Indications:See the history and physical. After informed consent the above procedure(s) were requested     Technique and findings: After adequate general anesthesia had been obtained the patient was placed into dorsal lithotomy position and the perineum was prepped and draped in the usual fashion. The meatus was dilated up to 97 [place] with the male dilators. The 25 French continuous flow resectoscope sheath was advanced into the bladder with the obturator in place. The resectoscope was coupled to the camera and then placed into the sheath. Bladder was thoroughly inspected. Both ureteral orifices were identified and had clear reflux. Bladder was moderately trabeculated. Prostatic fossa was consistent with prior TURP. The 10 x 10 mm papillary tumor was then identified just lateral to the right orifice. Using the loop electrode the tumor was resected and fragments submitted to pathology. The base the tumor was cauterized. The resectoscope was then removed and 10 cc of viscous Xylocaine was instilled within the urethra and the bladder. A 20 French silicone catheter was placed and irrigated until clear. 40 mg of mitomycin-C was instilled within the bladder and the catheter was plugged. A B&O suppository was placed. The procedure was then terminated and the patient was transferred to the recovery room in stable condition.

## 2015-03-07 NOTE — Progress Notes (Signed)
pts O2 SAT TAKEN BY EAR 93 PERCENT ON DISCHARGE

## 2015-03-07 NOTE — Anesthesia Preprocedure Evaluation (Addendum)
Anesthesia Evaluation  Patient identified by MRN, date of birth, ID band Patient awake    Reviewed: Allergy & Precautions, NPO status , Patient's Chart, lab work & pertinent test results  Airway Mallampati: III  TM Distance: >3 FB Neck ROM: Full    Dental  (+) Teeth Intact, Implants   Pulmonary sleep apnea and Oxygen sleep apnea , COPD (on 2.5 L at night) oxygen dependent, Current Smoker (1 ppd),          Cardiovascular hypertension, Pt. on medications and Pt. on home beta blockers + CAD, + Past MI (2000 s/p stent), + Cardiac Stents and + Peripheral Vascular Disease + dysrhythmias (? hx)     Neuro/Psych Anxiety Depression    GI/Hepatic GERD- (non after stomach wrap)  ,  Endo/Other  diabetes (diet controlled), Type 2Hypothyroidism   Renal/GU Renal disease (stones)     Musculoskeletal  (+) Arthritis -,   Abdominal   Peds  Hematology   Anesthesia Other Findings   Reproductive/Obstetrics                            Anesthesia Physical Anesthesia Plan  ASA: III  Anesthesia Plan: General   Post-op Pain Management:    Induction: Intravenous  Airway Management Planned: Oral ETT  Additional Equipment:   Intra-op Plan:   Post-operative Plan:   Informed Consent: I have reviewed the patients History and Physical, chart, labs and discussed the procedure including the risks, benefits and alternatives for the proposed anesthesia with the patient or authorized representative who has indicated his/her understanding and acceptance.     Plan Discussed with:   Anesthesia Plan Comments:         Anesthesia Quick Evaluation

## 2015-03-07 NOTE — Progress Notes (Signed)
CATH EMPTIED OF 50CC CLEAR URINE UPON DISCHARGE

## 2015-03-07 NOTE — Discharge Instructions (Addendum)
Bladder Cancer Bladder cancer is an abnormal growth of tissue in your bladder. Your bladder is the balloon-like sac in your pelvis. It collects and stores urine that comes from the kidneys through the ureters. The bladder wall is made of layers. If cancer spreads into these layers and through the wall of the bladder, it becomes more difficult to treat.  There are four stages of bladder cancer:  Stage I. Cancer at this stage occurs in the bladder's inner lining but has not invaded the muscular bladder wall.  Stage II. At this stage, cancer has invaded the bladder wall but is still confined to the bladder.  Stage III. By this stage, the cancer cells have spread through the bladder wall to surrounding tissue. They may also have spread to the prostate in men or the uterus or vagina in women.  Stage IV. By this stage, cancer cells may have spread to the lymph nodes and other organs, such as your lungs, bones, or liver. RISK FACTORS Although the cause of bladder cancer is not known, the following risk factors can increase your chances of getting bladder cancer:   Smoking.   Occupational exposures, such as rubber, leather, textile, dyes, chemicals, and paint.  Being white.  Age.   Being male.   Having chronic bladder inflammation.   Having a bladder cancer history.   Having a family history of bladder cancer (heredity).   Having had chemotherapy or radiation therapy to the pelvis.   Being exposed to arsenic.  SYMPTOMS   Blood in the urine.   Pain with urination.   Frequent bladder or urine infections.  Increase in urgency and frequency of urination. DIAGNOSIS  Your health care provider may suspect bladder cancer based on your description of urinary symptoms or based on the finding of blood or infection in the urine (especially if this has recurred several times). Other tests or procedures that may be performed include:   A narrow tube being inserted into your bladder  through your urethra (cystoscopy) in order to view the lining of your bladder for tumors.   A biopsy to sample the tumor to see if cancer is present.  If cancer is present, it will then be staged to determine its severity and extent. It is important to know how deeply into the bladder wall the cancer has grown and whether the cancer has spread to any other parts of your body. Staging may require blood tests or special scans such as a CT scan, MRI, bone scan, or chest X-ray.  TREATMENT  Once your cancer has been diagnosed and staged, you should discuss a treatment plan with your health care provider. Based on the stage of the cancer, one treatment or a combination of treatments may be recommended. The most common forms of treatment are:   Surgery. Procedures that may be done include transurethral resection and cystectomy.  Radiation therapy. This is infrequently used to treat bladder cancer.   Chemotherapy. During this treatment, drugs are used to kill cancer cells.  Immunotherapy. This is usually administered directly into the bladder. HOME CARE INSTRUCTIONS  Take medicines only as directed by your health care provider.   Maintain a healthy diet.   Consider joining a support group. This may help you learn to cope with the stress of having bladder cancer.   Seek advice to help you manage treatment side effects.   Keep all follow-up visits as directed by your health care provider.   Inform your cancer specialist if you are  admitted to the hospital.   °SEEK MEDICAL CARE IF: °· There is blood in your urine. °· You have symptoms of a urinary tract infection. These include: °¨ Tiredness. °¨ Shakiness. °¨ Weakness. °¨ Muscle aches. °¨ Abdominal pain. °¨ Frequent and intense urge to urinate (in young women). °¨ Burning feeling in the bladder or urethra during urination (in young women). °SEEK IMMEDIATE MEDICAL CARE IF:  °You are unable to urinate. °Document Released: 07/11/2003 Document  Revised: 11/22/2013 Document Reviewed: 12/29/2012 °[employer]® Patient Information ©2015 [employer]. This information is not intended to replace advice given to you by your health care provider. Make sure you discuss any questions you have with your health care provider. ° °Transurethral Resection, Bladder Tumor °A cancerous growth (tumor) can develop on the inside wall of the bladder. The bladder is the organ that holds urine. One way to remove the tumor is a procedure called a transurethral resection. The tumor is removed (resected) through the tube that carries urine from the bladder out of the body (urethra). No cuts (incisions) are made in the skin. Instead, the procedure is done through a thin telescope, called a resectoscope. Attached to it is a light and usually a tiny camera. The resectoscope is put into the urethra. In men, the urethra opens at the end of the penis. In women, it opens just above the vagina.  °A transurethral resection is usually used to remove tumors that have not gotten too big or too deep. These are called Stage 0, Stage 1 or Stage 2 bladder cancers. °LET YOUR CAREGIVER KNOW ABOUT:  °On the day of the procedure, your caregivers will need to know the last time you had anything to eat or drink. This includes water, gum, and candy. In advance, make sure they know about:  °· Any allergies. °· All medications you are taking, including: °¨ Herbs, eyedrops, over-the-counter medications and creams. °¨ Blood thinners (anticoagulants), aspirin or other drugs that could affect blood clotting. °· Use of steroids (by mouth or as creams). °· Previous problems with anesthetics, including local anesthetics. °· Possibility of pregnancy, if this applies. °· Any history of blood clots. °· Any history of bleeding or other blood problems. °· Previous surgery. °· Smoking history. °· Any recent symptoms of colds or infections. °· Other health problems. °RISKS AND COMPLICATIONS °This is usually a safe  procedure. Every procedure has risks, though. For a transurethral resection, they include: °· Infection. Antibiotic medication would need to be taken. °· Bleeding. °¨ Light bleeding may last for several days after the procedure. °¨ If bleeding continues or is heavy, the bladder may need rinsing. Or, a new catheter might be put in for awhile. °¨ Sometimes bed rest is needed. °· Urination problems. °¨ Pain and burning can occur when urinating. This usually goes away in a few days. °¨ Scarring from the procedure can block the flow of urine. °· Bladder damage. °¨ It can be punctured or torn during removal of the tumor. If this happens, a catheter might be needed for longer. Antibiotics would be taken while the bladder heals. °¨ Urine can leak through the hole or tear into the abdomen. If this happens, surgery may be needed to repair the bladder. °BEFORE THE PROCEDURE  °· A medical evaluation will be done. This may include: °¨ A physical examination. °¨ Urine test. This is to make sure you do not have a urinary tract infection. °¨ Blood tests. °¨ A test that checks the heart's rhythm (electrocardiogram). °¨ Talking with an   anesthesiologist. This is the person who will be in charge of the medication (anesthesia) to keep you from feeling pain during the transurethral resection. You might be asleep during the procedure (general anesthesia) or numb from the waist down, but awake during the procedure (spinal anesthesia). Ask your surgeon what to expect. °· The person who is having a transurethral resection needs to give what is called informed consent. This requires signing a legal paper that gives permission for the procedure. To give informed consent: °¨ You must understand how the procedure is done and why. °¨ You must be told all the risks and benefits of the procedure. °¨ You must sign the consent. Sometimes a legal guardian can do this. °¨ Signing should be witnessed by a healthcare professional. °· The day before the  surgery, eat only a light dinner. Then, do not eat or drink anything for at least 8 hours before the surgery. Ask your caregiver if it is OK to take any needed medicines with a sip of water. °· Arrive at least an hour before the surgery or whenever your surgeon recommends. This will give you time to check in and fill out any needed paperwork. °PROCEDURE °· The preparation: °¨ You will change into a hospital gown. °¨ A needle will be inserted in your arm. This is an intravenous access tube (IV). Medication will be able to flow directly into your body through this needle. °¨ Small monitors will be put on your body. They are used to check your heart, blood pressure, and oxygen level. °¨ You might be given medication that will help you relax (sedative). °¨ You will be given a general anesthetic or spinal anesthesia. °· The procedure: °¨ Once you are asleep or numb from the waist down, your legs will be placed in stirrups. °¨ The resectoscope will be passed through the urethra into the bladder. °¨ Fluid will be passed through the resectoscope. This will fill the bladder with water. °¨ The surgeon will examine the bladder through the scope. If the scope has a camera, it can take pictures from inside the bladder. They can be projected onto a TV screen. °¨ The surgeon will use various tools to remove the tumor in small pieces. Sometimes a laser (a beam of light energy) is used. Other tools may use electric current. °¨ A tube (catheter) will often be placed so that urine can drain into a bag outside the body. This process helps stop bleeding. This tube keeps blood clots from blocking the urethra. °¨ The procedure usually takes 30 to 45 minutes. °AFTER THE PROCEDURE  °· You will stay in a recovery area until the anesthesia has worn off. Your blood pressure and pulse will be checked every so often. Then you will be taken to a hospital room. °· You may continue to get fluids through the IV for awhile. °· Some pain is normal.  The catheter might be uncomfortable. Pain is usually not severe. If it is, ask for pain medicine. °· Your urine may look bloody after a transurethral resection. This is normal. °¨ If bleeding is heavy, a hospital caregiver may rinse out the bladder (irrigation) through the catheter. °¨ Once the urine is clear, the catheter will be taken out. °¨ You will need to stay in the hospital until you can urinate on your own. °· Most people stay in the hospital for up to 4 days. °PROGNOSIS  °· Transurethral resection is considered the best way to treat bladder tumors that are not   too far along. For most people, the treatment is successful. Sometimes, though, more treatment is needed.  Bladder cancers can come back even after a successful procedure. Because of this, be sure to have a checkup with your caregiver every 3 to 6 months. If everything is OK for 3 years, you can reduce the checkups to once a year. Document Released: 05/04/2009 Document Revised: 09/30/2011 Document Reviewed: 08/17/2013 [facility] Patient Information 2015 [place], [place]. This information is not intended to replace advice given to you by your health care provider. Make sure you discuss any questions you have with your health care provider. AMBULATORY SURGERY  DISCHARGE INSTRUCTIONS   1) The drugs that you were given will stay in your system until tomorrow so for the next 24 hours you should not:  A) Drive an automobile B) Make any legal decisions C) Drink any alcoholic beverage   2) You may resume regular meals tomorrow.  Today it is better to start with liquids and gradually work up to solid foods.  You may eat anything you prefer, but it is better to start with liquids, then soup and crackers, and gradually work up to solid foods.   3) Please notify your doctor immediately if you have any unusual bleeding, trouble breathing, redness and pain at the surgery site, drainage, fever, or pain not relieved by  medication.    4) Additional Instructions:        Please contact your physician with any problems or Same Day Surgery at [phone], Monday through Friday 6 am to 4 pm, or [place] at [facility] number at [phone].AMBULATORY SURGERY  DISCHARGE INSTRUCTIONS   5) The drugs that you were given will stay in your system until tomorrow so for the next 24 hours you should not:  D) Drive an automobile E) Make any legal decisions F) Drink any alcoholic beverage   6) You may resume regular meals tomorrow.  Today it is better to start with liquids and gradually work up to solid foods.  You may eat anything you prefer, but it is better to start with liquids, then soup and crackers, and gradually work up to solid foods.   7) Please notify your doctor immediately if you have any unusual bleeding, trouble breathing, redness and pain at the surgery site, drainage, fever, or pain not relieved by medication.    8) Additional Instructions:        Please contact your physician with any problems or Same Day Surgery at [phone], Monday through Friday 6 am to 4 pm, or [place] at [facility] number at [phone].

## 2015-03-07 NOTE — Transfer of Care (Signed)
Immediate Anesthesia Transfer of Care Note  Patient: [name] Eye  Procedure(s) Performed: Procedure(s): TRANSURETHRAL RESECTION OF BLADDER TUMOR (TURBT) (N/A)  Patient Location: PACU  Anesthesia Type:General  Level of Consciousness: sedated  Airway & Oxygen Therapy: Patient Spontanous Breathing and Patient connected to face mask oxygen  Post-op Assessment: Report given to RN  Post vital signs: Reviewed and stable  Last Vitals:  Filed Vitals:   03/07/15 1400  BP: 128/69  Pulse: 65  Temp: 36.9 C  Resp: 9    Complications: No apparent anesthesia complications

## 2015-03-08 ENCOUNTER — Encounter: Payer: Self-pay | Admitting: Urology

## 2015-03-08 LAB — SURGICAL PATHOLOGY

## 2015-03-08 NOTE — Anesthesia Postprocedure Evaluation (Signed)
  Anesthesia Post-op Note  Patient: [name]  Procedure(s) Performed: Procedure(s): TRANSURETHRAL RESECTION OF BLADDER TUMOR (TURBT) (N/A)  Anesthesia type:General  Patient location: PACU  Post pain: Pain level controlled  Post assessment: Post-op Vital signs reviewed, Patient's Cardiovascular Status Stable, Respiratory Function Stable, Patent Airway and No signs of Nausea or vomiting  Post vital signs: Reviewed and stable  Last Vitals:  Filed Vitals:   03/07/15 1614  BP: 103/52  Pulse: 51  Temp:   Resp:     Level of consciousness: awake, alert  and patient cooperative  Complications: No apparent anesthesia complications

## 2015-04-25 ENCOUNTER — Encounter: Payer: Self-pay | Admitting: *Deleted

## 2015-04-25 ENCOUNTER — Emergency Department
Admission: EM | Admit: 2015-04-25 | Discharge: 2015-04-25 | Disposition: A | Discharge status: Home / Self Care | Payer: Medicare Other | Attending: Student | Admitting: Student

## 2015-04-25 DIAGNOSIS — Y998 Other external cause status: Secondary | ICD-10-CM | POA: Diagnosis not present

## 2015-04-25 DIAGNOSIS — E119 Type 2 diabetes mellitus without complications: Secondary | ICD-10-CM | POA: Diagnosis not present

## 2015-04-25 DIAGNOSIS — Y9389 Activity, other specified: Secondary | ICD-10-CM | POA: Insufficient documentation

## 2015-04-25 DIAGNOSIS — Z79899 Other long term (current) drug therapy: Secondary | ICD-10-CM | POA: Diagnosis not present

## 2015-04-25 DIAGNOSIS — Z720 Tobacco use: Secondary | ICD-10-CM | POA: Diagnosis not present

## 2015-04-25 DIAGNOSIS — Z880 Allergy status to penicillin: Secondary | ICD-10-CM | POA: Insufficient documentation

## 2015-04-25 DIAGNOSIS — Y288XXA Contact with other sharp object, undetermined intent, initial encounter: Secondary | ICD-10-CM | POA: Diagnosis not present

## 2015-04-25 DIAGNOSIS — I10 Essential (primary) hypertension: Secondary | ICD-10-CM | POA: Diagnosis not present

## 2015-04-25 DIAGNOSIS — Y9289 Other specified places as the place of occurrence of the external cause: Secondary | ICD-10-CM | POA: Insufficient documentation

## 2015-04-25 DIAGNOSIS — Z7901 Long term (current) use of anticoagulants: Secondary | ICD-10-CM | POA: Insufficient documentation

## 2015-04-25 DIAGNOSIS — S81812A Laceration without foreign body, left lower leg, initial encounter: Secondary | ICD-10-CM | POA: Insufficient documentation

## 2015-04-25 MED ORDER — LIDOCAINE-EPINEPHRINE (PF) 1 %-1:200000 IJ SOLN
INTRAMUSCULAR | Status: AC
  Filled 2015-04-25: qty 30

## 2015-04-25 NOTE — ED Notes (Signed)
Pt reports laceration to left shin a couple hours ago from a pressure washer. Pt is on coumadin. EMS initially came out, but did not want to come in. Started to bleed again. Bleeding controlled at this time, pressure dressing in place.

## 2015-04-25 NOTE — Discharge Instructions (Signed)

## 2015-04-25 NOTE — ED Provider Notes (Signed)
[facility] Emergency Department Provider Note  ____________________________________________  Time seen: Approximately 9:33 PM  I have reviewed the triage vital signs and the nursing notes.   HISTORY  Chief Complaint Extremity Laceration    HPI [name] is a 69 y.o. male who presents for evaluation of laceration to his left lower leg. Patient states that he was trying to start his pressure walker when he fell over and cut his leg. Past medical history significant for Coumadin use   Past Medical History  Diagnosis Date  . Asthma   . Hypertension   . Diabetes mellitus without complication ([place])   . Dysrhythmia     afib  . Coronary artery disease     2000  . Peripheral vascular disease ([place])   . Sleep apnea     o2  2.5 l hs  . Neuropathy ([place])   . DVT of lower extremity, bilateral ([place])   . S/P IVC filter     removed  . Depression   . Pain     chronic syndrome  . Anxiety   . Sleep disorder   . CHF (congestive heart failure) ([place])   . COPD (chronic obstructive pulmonary disease) ([place])   . Arthritis     INFLAMMATORY POLYARTHROPATHY  . Stasis dermatitis of both legs     There are no active problems to display for this patient.   Past Surgical History  Procedure Laterality Date  . Coronary angioplasty      2000  . Back surgery      lumbar fusion  . Nissen fundoplication    . Bladder tumor excision    . Mastoidectomy      x 3  . Colon surgery      bowel blockage  . Cervical fusion    . Intrathecal pump implantation    . Infusion pump replacement    . Transurethral resection of bladder tumor N/A 03/07/2015    Procedure: TRANSURETHRAL RESECTION OF BLADDER TUMOR (TURBT);  Surgeon: Royston Cowper, MD;  Location: [facility] ORS;  Service: Urology;  Laterality: N/A;    Current Outpatient Rx  Name  Route  Sig  Dispense  Refill  . amitriptyline (ELAVIL) 100 MG tablet   Oral   Take 100 mg by mouth at bedtime.         Marland Kitchen atenolol  (TENORMIN) 25 MG tablet   Oral   Take by mouth daily.         . bisacodyl (DULCOLAX) 5 MG EC tablet   Oral   Take 5 mg by mouth daily as needed for moderate constipation.         . Bupivacaine HCl (BUPIVACAINE ON-Q PAIN PUMP, FOR ORDER SET NO CHG,)   Other   by Other route once. Intrathecal pain pump         . diazepam (VALIUM) 5 MG tablet   Oral   Take 5 mg by mouth every 8 (eight) hours as needed for anxiety.         . docusate sodium (COLACE) 100 MG capsule   Oral   Take 2 capsules (200 mg total) by mouth 2 (two) times daily.   120 capsule   3   . fexofenadine (ALLEGRA) 180 MG tablet   Oral   Take 180 mg by mouth daily.         Marland Kitchen gabapentin (NEURONTIN) 600 MG tablet   Oral   Take 600 mg by mouth 4 (four) times daily.         Marland Kitchen  hydrOXYzine (ATARAX/VISTARIL) 10 MG tablet   Oral   Take 10 mg by mouth 3 (three) times daily as needed.         Marland Kitchen levothyroxine (SYNTHROID, LEVOTHROID) 75 MCG tablet   Oral   Take 75 mcg by mouth daily before breakfast.         . magnesium oxide (MAG-OX) 400 MG tablet   Oral   Take 400 mg by mouth daily.         . Methen-Hyosc-Meth Blue-Na Phos (UROGESIC-BLUE) 81.6 MG TABS   Oral   Take 1 tablet (81.6 mg total) by mouth every 6 (six) hours as needed (dysuria).   40 tablet   3     Dispense as written.   . Multiple Vitamin (MULTIVITAMIN) capsule   Oral   Take 1 capsule by mouth daily.         . Omega-3 Fatty Acids (FISH OIL) 1200 MG CAPS   Oral   Take 1 capsule by mouth daily.         . promethazine (PHENERGAN) 12.5 MG tablet   Oral   Take 12.5 mg by mouth every 6 (six) hours as needed for nausea or vomiting.         . simvastatin (ZOCOR) 40 MG tablet   Oral   Take 40 mg by mouth daily.         Marland Kitchen tiZANidine (ZANAFLEX) 4 MG capsule   Oral   Take 4 mg by mouth at bedtime as needed for muscle spasms. 1 to 2 tab         . warfarin (COUMADIN) 6 MG tablet   Oral   Take 6.5 mg by mouth daily.            Allergies Codeine; Morphine and related; Oxycontin; and Penicillins  No family history on file.  Social History Social History  Substance Use Topics  . Smoking status: Current Every Day Smoker  . Smokeless tobacco: Never Used  . Alcohol Use: No    Review of Systems Constitutional: No fever/chills Eyes: No visual changes. ENT: No sore throat. Cardiovascular: Denies chest pain. Respiratory: Denies shortness of breath. Gastrointestinal: No abdominal pain.  No nausea, no vomiting.  No diarrhea.  No constipation. Genitourinary: Negative for dysuria. Musculoskeletal: Positive for left leg laceration Skin: 3.5 cm laceration to the left leg. Neurological: Negative for headaches, focal weakness or numbness.  10-point ROS otherwise negative.  ____________________________________________   PHYSICAL EXAM:  VITAL SIGNS: ED Triage Vitals  Enc Vitals Group     BP 04/25/15 1931 118/73 mmHg     Pulse Rate 04/25/15 1931 76     Resp 04/25/15 1931 16     Temp 04/25/15 1931 98.1 F (36.7 C)     Temp Source 04/25/15 1931 Oral     SpO2 04/25/15 1931 93 %     Weight 04/25/15 1931 230 lb (104.327 kg)     Height 04/25/15 1931 6\' 2"  (1.88 m)     Head Cir --      Peak Flow --      Pain Score 04/25/15 1929 6     Pain Loc --      Pain Edu? --      Excl. in [place]? --     Constitutional: Alert and oriented. Well appearing and in no acute distress. Cardiovascular: Normal rate, regular rhythm. Grossly normal heart sounds.  Good peripheral circulation. Respiratory: Normal respiratory effort.  No retractions. Lungs CTAB. Gastrointestinal: Soft and nontender. No distention.  No abdominal bruits. No CVA tenderness. Musculoskeletal: No lower extremity tenderness nor edema.  No joint effusions. Neurologic:  Normal speech and language. No gross focal neurologic deficits are appreciated. No gait instability. Skin:  3.5 cm laceration to the left lower anterior leg. No evidence of tendon or muscle  involvement. Distally neurovascularly intact. Psychiatric: Mood and affect are normal. Speech and behavior are normal.  ____________________________________________   LABS (all labs ordered are listed, but only abnormal results are displayed)  Labs Reviewed - No data to display ____________________________________________  PROCEDURES  Procedure(s) performed: YES LACERATION REPAIR Performed by: Arlyss Repress Authorized by: Arlyss Repress Consent: Verbal consent obtained. Risks and benefits: risks, benefits and alternatives were discussed Consent given by: patient Patient identity confirmed: provided demographic data Prepped and Draped in normal sterile fashion Wound explored  Laceration Location: Left anterior leg  Laceration Length: 3.5 cm  No Foreign Bodies seen or palpated  Anesthesia: local infiltration  Local anesthetic: lidocaine 1 % with epinephrine  Anesthetic total: 3 ml  Irrigation method: syringe Amount of cleaning: standard  Skin closure: 4-0 Vicryl   Number of sutures: 6   Technique: Simple interrupted   Patient tolerance: Patient tolerated the procedure well with no immediate complications.  Critical Care performed: No  ____________________________________________   INITIAL IMPRESSION / ASSESSMENT AND PLAN / ED COURSE  Pertinent labs & imaging results that were available during my care of the patient were reviewed by me and considered in my medical decision making (see chart for details).  Laceration left anterior leg. Wound closed as noted above. Patient return to clinic in one week for suture removal. Patient voices understanding instructed to keep the pressure rose on his leg into the bleeding is completely controlled. At the time of discharge there was no active bleeding noted from the wound. ____________________________________________   FINAL CLINICAL IMPRESSION(S) / ED DIAGNOSES  Final diagnoses:  Leg laceration, left, initial  encounter      Arlyss Repress, PA-C 04/25/15 2153  Joanne Gavel, MD 04/25/15 [phone]

## 2015-05-10 ENCOUNTER — Other Ambulatory Visit: Payer: Self-pay | Admitting: Family Medicine

## 2015-05-10 ENCOUNTER — Ambulatory Visit
Admission: RE | Admit: 2015-05-10 | Discharge: 2015-05-10 | Disposition: A | Discharge status: Home / Self Care | Payer: Medicare Other | Source: Ambulatory Visit | Attending: Family Medicine | Admitting: Family Medicine

## 2015-05-10 DIAGNOSIS — M79605 Pain in left leg: Secondary | ICD-10-CM | POA: Insufficient documentation

## 2015-05-10 DIAGNOSIS — M7989 Other specified soft tissue disorders: Secondary | ICD-10-CM | POA: Diagnosis present

## 2015-05-10 DIAGNOSIS — M79662 Pain in left lower leg: Secondary | ICD-10-CM

## 2015-05-18 ENCOUNTER — Ambulatory Visit: Payer: Medicare Other | Admitting: Surgery

## 2015-05-22 ENCOUNTER — Encounter: Payer: Medicare Other | Attending: Surgery | Admitting: Surgery

## 2015-05-22 DIAGNOSIS — G629 Polyneuropathy, unspecified: Secondary | ICD-10-CM | POA: Insufficient documentation

## 2015-05-22 DIAGNOSIS — I252 Old myocardial infarction: Secondary | ICD-10-CM | POA: Diagnosis not present

## 2015-05-22 DIAGNOSIS — F17218 Nicotine dependence, cigarettes, with other nicotine-induced disorders: Secondary | ICD-10-CM | POA: Insufficient documentation

## 2015-05-22 DIAGNOSIS — J45909 Unspecified asthma, uncomplicated: Secondary | ICD-10-CM | POA: Diagnosis not present

## 2015-05-22 DIAGNOSIS — I2510 Atherosclerotic heart disease of native coronary artery without angina pectoris: Secondary | ICD-10-CM | POA: Diagnosis not present

## 2015-05-22 DIAGNOSIS — J449 Chronic obstructive pulmonary disease, unspecified: Secondary | ICD-10-CM | POA: Diagnosis not present

## 2015-05-22 DIAGNOSIS — M1990 Unspecified osteoarthritis, unspecified site: Secondary | ICD-10-CM | POA: Diagnosis not present

## 2015-05-22 DIAGNOSIS — I82592 Chronic embolism and thrombosis of other specified deep vein of left lower extremity: Secondary | ICD-10-CM | POA: Diagnosis not present

## 2015-05-22 DIAGNOSIS — X58XXXA Exposure to other specified factors, initial encounter: Secondary | ICD-10-CM | POA: Diagnosis not present

## 2015-05-22 DIAGNOSIS — L97222 Non-pressure chronic ulcer of left calf with fat layer exposed: Secondary | ICD-10-CM | POA: Insufficient documentation

## 2015-05-22 DIAGNOSIS — S81812A Laceration without foreign body, left lower leg, initial encounter: Secondary | ICD-10-CM | POA: Insufficient documentation

## 2015-05-22 DIAGNOSIS — I739 Peripheral vascular disease, unspecified: Secondary | ICD-10-CM | POA: Diagnosis not present

## 2015-05-22 DIAGNOSIS — Z86718 Personal history of other venous thrombosis and embolism: Secondary | ICD-10-CM | POA: Insufficient documentation

## 2015-05-22 DIAGNOSIS — I10 Essential (primary) hypertension: Secondary | ICD-10-CM | POA: Insufficient documentation

## 2015-05-23 NOTE — Progress Notes (Signed)
BAIN, WHICHARD ([id number]) Visit Report for 05/22/2015 Abuse/Suicide Risk Screen Details Patient Name: [name], Gabriel A. Date of Service: 05/22/2015 10:15 AM Medical Record Number: [id number] Patient Account Number: [id number] Date of Birth/Sex: May 31, 1946 (69 y.o. Male) Treating RN: Montey Hora Primary Care Physician: Ramonita Lab Other Clinician: Referring Physician: Briscoe Deutscher Treating Physician/Extender: Frann Rider in Treatment: 0 Abuse/Suicide Risk Screen Items Answer ABUSE/SUICIDE RISK SCREEN: Has anyone close to you tried to hurt or harm you recentlyo No Do you feel uncomfortable with anyone in your familyo No Has anyone forced you do things that you didnot want to doo No Do you have any thoughts of harming yourselfo No Patient displays signs or symptoms of abuse and/or neglect. No Electronic Signature(s) Signed: 05/22/2015 5:18:22 PM By: Montey Hora Entered By: Montey Hora on 05/22/2015 10:35:53 [name], [name] Kitchen ([id number]) -------------------------------------------------------------------------------- Activities of Daily Living Details Patient Name: [name], Gabriel A. Date of Service: 05/22/2015 10:15 AM Medical Record Number: [id number] Patient Account Number: [id number] Date of Birth/Sex: [birth year] (69 y.o. Male) Treating RN: Montey Hora Primary Care Physician: Ramonita Lab Other Clinician: Referring Physician: Briscoe Deutscher Treating Physician/Extender: Frann Rider in Treatment: 0 Activities of Daily Living Items Answer Activities of Daily Living (Please select one for each item) Drive Automobile Not Able Take Medications Completely Able Use Telephone Completely Able Care for Appearance Completely Able Use Toilet Completely Able Bath / Shower Need Assistance Dress Self Completely Able Feed Self Completely Able Walk Need Assistance Get In / Out Bed Completely Able Housework Need Assistance Prepare Meals Completely [place] for Self Need Assistance Electronic Signature(s) Signed: 05/22/2015 5:18:22 PM By: Montey Hora Entered By: Montey Hora on 05/22/2015 10:36:25 [name], [name] ([id number]) -------------------------------------------------------------------------------- Education Assessment Details Patient Name: [name], Gabriel A. Date of Service: 05/22/2015 10:15 AM Medical Record Number: [id number] Patient Account Number: [id number] Date of Birth/Sex: July 06, 1946 (69 y.o. Male) Treating RN: Montey Hora Primary Care Physician: Ramonita Lab Other Clinician: Referring Physician: Briscoe Deutscher Treating Physician/Extender: Frann Rider in Treatment: 0 Primary Learner Assessed: Patient Learning Preferences/Education Level/Primary Language Learning Preference: Explanation, Demonstration Highest Education Level: College or Above Preferred Language: English Cognitive Barrier Assessment/Beliefs Language Barrier: No Translator Needed: No Memory Deficit: No Emotional Barrier: No Cultural/Religious Beliefs Affecting Medical No Care: Physical Barrier Assessment Impaired Vision: No Impaired Hearing: No Decreased Hand dexterity: No Knowledge/Comprehension Assessment Knowledge Level: Medium Comprehension Level: Medium Ability to understand written Medium instructions: Ability to understand verbal Medium instructions: Motivation Assessment Anxiety Level: Calm Cooperation: Cooperative Education Importance: Acknowledges Need Interest in Health Problems: Asks Questions Perception: Coherent Willingness to Engage in Self- Medium Management Activities: Readiness to Engage in Self- Medium Management Activities: Electronic Signature(s) [name], [name] ([id number]) Signed: 05/22/2015 5:18:22 PM By: Montey Hora Entered By: Montey Hora on 05/22/2015 10:36:48 [name], [name]  ([id number]) -------------------------------------------------------------------------------- Fall Risk Assessment Details Patient Name: [name], Gabriel A. Date of Service: 05/22/2015 10:15 AM Medical Record Number: [id number] Patient Account Number: [id number] Date of Birth/Sex: 1945-08-07 (69 y.o. Male) Treating RN: Montey Hora Primary Care Physician: Ramonita Lab Other Clinician: Referring Physician: Briscoe Deutscher Treating Physician/Extender: Frann Rider in Treatment: 0 Fall Risk Assessment Items FALL RISK ASSESSMENT: History of falling - immediate or within 3 months 25 Yes Secondary diagnosis 0 No Ambulatory aid None/bed rest/wheelchair/nurse 0 No Crutches/cane/walker 15 Yes Furniture 0 No IV Access/Saline Lock 0 No Gait/Training Normal/bed rest/immobile 0 No Weak 10 Yes Impaired 0 No Mental Status Oriented to own ability 0 Yes Electronic Signature(s) Signed: 05/22/2015  5:18:22 PM By: Montey Hora Entered By: Montey Hora on 05/22/2015 10:37:00 [name], [name] Kitchen ([id number]) -------------------------------------------------------------------------------- Foot Assessment Details Patient Name: [name], Gabriel A. Date of Service: 05/22/2015 10:15 AM Medical Record Number: [id number] Patient Account Number: [id number] Date of Birth/Sex: 09/09/1945 (69 y.o. Male) Treating RN: Montey Hora Primary Care Physician: Ramonita Lab Other Clinician: Referring Physician: Briscoe Deutscher Treating Physician/Extender: Frann Rider in Treatment: 0 Foot Assessment Items Site Locations + = Sensation present, - = Sensation absent, C = Callus, U = Ulcer R = Redness, W = Warmth, M = Maceration, PU = Pre-ulcerative lesion F = Fissure, S = Swelling, D = Dryness Assessment Right: Left: Other Deformity: No No Prior Foot Ulcer: No No Prior Amputation: No No Charcot Joint: No No Ambulatory Status: Ambulatory With Help Assistance Device: Walker Gait: Steady Electronic  Signature(s) Signed: 05/22/2015 5:18:22 PM By: Montey Hora Entered By: Montey Hora on 05/22/2015 10:43:37 [name], Justine [name] Kitchen ([id number]) -------------------------------------------------------------------------------- Nutrition Risk Assessment Details Patient Name: [name], Gabriel A. Date of Service: 05/22/2015 10:15 AM Medical Record Number: [id number] Patient Account Number: [id number] Date of Birth/Sex: 12-21-1945 (69 y.o. Male) Treating RN: Montey Hora Primary Care Physician: Ramonita Lab Other Clinician: Referring Physician: Briscoe Deutscher Treating Physician/Extender: Frann Rider in Treatment: 0 Height (in): 74 Weight (lbs): 230 Body Mass Index (BMI): 29.5 Nutrition Risk Assessment Items NUTRITION RISK SCREEN: I have an illness or condition that made me change the kind and/or 0 No amount of food I eat I eat fewer than two meals per day 0 No I eat few fruits and vegetables, or milk products 0 No I have three or more drinks of beer, liquor or wine almost every day 0 No I have tooth or mouth problems that make it hard for me to eat 0 No I don't always have enough money to buy the food I need 0 No I eat alone most of the time 0 No I take three or more different prescribed or over-the-counter drugs a 1 Yes day Without wanting to, I have lost or gained 10 pounds in the last six 0 No months I am not always physically able to shop, cook and/or feed myself 0 No Nutrition Protocols Good Risk Protocol 0 No interventions needed Moderate Risk Protocol Electronic Signature(s) Signed: 05/22/2015 5:18:22 PM By: Montey Hora Entered By: Montey Hora on 05/22/2015 10:37:07

## 2015-05-23 NOTE — Progress Notes (Signed)
BRAE, GARTMAN ([id number]) Visit Report for 05/22/2015 Chief Complaint Document Details Patient Name: [name], Gabriel A. Date of Service: 05/22/2015 10:15 AM Medical Record Number: [id number] Patient Account Number: [id number] Date of Birth/Sex: 31-May-1946 (69 y.o. Male) Treating RN: Montey Hora Primary Care Physician: Ramonita Lab Other Clinician: Referring Physician: Briscoe Deutscher Treating Physician/Extender: Frann Rider in Treatment: 0 Information Obtained from: Patient Chief Complaint Patient presents to the wound care center for a consult due non healing wound. He had a lacerated wound to the left lower extremity on 04/25/2015 and this was sutured in the ER. He was placed on doxycycline orally and then when his sutures were removed the wound opened out for the last 10 days or so. Electronic Signature(s) Signed: 05/22/2015 11:28:08 AM By: Christin Fudge MD, FACS Entered By: Christin Fudge on 05/22/2015 11:28:08 Majchrzak, [name] ([id number]) -------------------------------------------------------------------------------- Debridement Details Patient Name: [name], Gabriel A. Date of Service: 05/22/2015 10:15 AM Medical Record Number: [id number] Patient Account Number: [id number] Date of Birth/Sex: 09-29-1945 (69 y.o. Male) Treating RN: Montey Hora Primary Care Physician: Ramonita Lab Other Clinician: Referring Physician: Briscoe Deutscher Treating Physician/Extender: Frann Rider in Treatment: 0 Debridement Performed for Wound #1 Left,Anterior Lower Leg Assessment: Performed By: Physician Christin Fudge, MD Debridement: Debridement Pre-procedure Yes Verification/Time Out Taken: Start Time: 11:05 Pain Control: Lidocaine 4% Topical Solution Level: Skin/Subcutaneous Tissue Total Area Debrided (L x 2.7 (cm) x 1.3 (cm) = 3.51 (cm) W): Tissue and other Viable, Non-Viable, Fibrin/Slough, Subcutaneous material debrided: Instrument: Curette Bleeding: Minimum Hemostasis  Achieved: Pressure End Time: 11:07 Procedural Pain: 0 Post Procedural Pain: 0 Response to Treatment: Procedure was tolerated well Post Debridement Measurements of Total Wound Length: (cm) 2.7 Width: (cm) 1.3 Depth: (cm) 0.2 Volume: (cm) 0.551 Post Procedure Diagnosis Same as Pre-procedure Electronic Signature(s) Signed: 05/22/2015 11:27:18 AM By: Christin Fudge MD, FACS Signed: 05/22/2015 5:18:22 PM By: Montey Hora Entered By: Christin Fudge on 05/22/2015 11:27:18 Orgeron, [name] ([id number]) -------------------------------------------------------------------------------- HPI Details Patient Name: [name], Gabriel A. Date of Service: 05/22/2015 10:15 AM Medical Record Number: [id number] Patient Account Number: [id number] Date of Birth/Sex: Dec 01, 1945 (69 y.o. Male) Treating RN: Montey Hora Primary Care Physician: Ramonita Lab Other Clinician: Referring Physician: Briscoe Deutscher Treating Physician/Extender: Frann Rider in Treatment: 0 History of Present Illness Location: injury to the left lower extremity with wound open Quality: Patient reports experiencing a dull pain to affected area(s). Severity: Patient states wound are getting worse. Duration: Patient has had the wound for < 2 weeks prior to presenting for treatment Timing: Pain in wound is Intermittent (comes and goes Context: The wound occurred when the patient injured himself while working in the yard and then the sutured wound reopened once the sutures were removed. Modifying Factors: Consults to this date include:was on doxycycline which is now over. Associated Signs and Symptoms: Patient reports having difficulty standing for long periods. HPI Description: He had a lacerated wound to the left lower extremity on 04/25/2015 and this was sutured in the ER. He was placed on doxycycline orally and then when his sutures were removed the wound opened out for the last 10 days or so. 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 recently he has had his anticoagulation stopped because of bleeding from the bladder tumor and has been  put on thigh high compression stockings of the 20-30 mm mercury variety. He smokes over a pack of cigarettes a day. Electronic Signature(s) Signed: 05/22/2015 11:31:03 AM By: Christin Fudge MD, FACS Entered By: Christin Fudge on 05/22/2015 11:31:03 [name], [name] ([id number]) -------------------------------------------------------------------------------- Physical Exam Details Patient Name: [name], Gabriel A. Date of Service: 05/22/2015 10:15 AM Medical Record Number: [id number] Patient Account Number: [id number] Date of Birth/Sex: 12/30/1945 (69 y.o. Male) Treating RN: Montey Hora Primary Care Physician: Ramonita Lab Other Clinician: Referring Physician: Briscoe Deutscher Treating Physician/Extender: Frann Rider in Treatment: 0 Constitutional . Pulse regular. Respirations normal and unlabored. Afebrile. . Eyes Nonicteric. Reactive to light. Ears, Nose, Mouth, and Throat Lips, teeth, and gums WNL.Marland [name] Moist mucosa without lesions . Neck supple and nontender. No palpable supraclavicular or cervical adenopathy. Normal sized without goiter. Respiratory WNL. No retractions.. Cardiovascular Pedal Pulses WNL. ABI on the left was 1.34 in the right was 1.26. has bilateral edema of both lower extremities +1 pitting. Gastrointestinal (GI) Abdomen without masses or tenderness.. No liver or spleen enlargement or tenderness.. Lymphatic No adneopathy. No adenopathy. No adenopathy. Musculoskeletal Adexa without tenderness or  enlargement.. Digits and nails w/o clubbing, cyanosis, infection, petechiae, ischemia, or inflammatory conditions.. Integumentary (Hair, Skin) No suspicious lesions. No crepitus or fluctuance. No peri-wound warmth or erythema. No masses.Marland [name] Psychiatric Judgement and insight Intact.. No evidence of depression, anxiety, or agitation.. Notes he has a wound covered with slough on the shin of the left mid anterior calf region and this need sharp debridement with a curette. Electronic Signature(s) Signed: 05/22/2015 11:32:06 AM By: Christin Fudge MD, FACS Entered By: Christin Fudge on 05/22/2015 11:32:05 [name], [name] ([id number]) -------------------------------------------------------------------------------- Physician Orders Details Patient Name: [name], Gabriel A. Date of Service: 05/22/2015 10:15 AM Medical Record Number: [id number] Patient Account Number: [id number] Date of Birth/Sex: 1945-08-07 (69 y.o. Male) Treating RN: Montey Hora Primary Care Physician: Ramonita Lab Other Clinician: Referring Physician: Briscoe Deutscher Treating Physician/Extender: Frann Rider in Treatment: 0 Verbal / Phone Orders: Yes Clinician: Montey Hora Read Back and Verified: Yes Diagnosis Coding Wound Cleansing Wound #1 Left,Anterior Lower Leg o Clean wound with Normal Saline. o May Shower, gently pat wound dry prior to applying new dressing. Anesthetic Wound #1 Left,Anterior Lower Leg o Topical Lidocaine 4% cream applied to wound bed prior to debridement Skin Barriers/Peri-Wound Care Wound #1 Left,Anterior Lower Leg o Skin Prep Primary Wound Dressing Wound #1 Left,Anterior Lower Leg o Other: - siltec sorbact Dressing Change Frequency Wound #1 Left,Anterior Lower Leg o Other: - change when drainage reaches 1cm from edges of dressing Edema Control Wound #1 Left,Anterior Lower Leg o Patient to wear own compression stockings o Elevate legs to the level of the heart and pump  ankles as often as possible Additional Orders / Instructions Wound #1 Left,Anterior Lower Leg o Stop Smoking Electronic Signature(s) Signed: 05/22/2015 4:25:19 PM By: Christin Fudge MD, FACS Signed: 05/22/2015 5:18:22 PM By: Jackquline Bosch, [name] ([id number]) Entered By: Montey Hora on 05/22/2015 11:10:36 Vitanza, Travonta A. ([id number]) -------------------------------------------------------------------------------- Problem List Details Patient Name: [name], Gabriel A. Date of Service: 05/22/2015 10:15 AM Medical Record Number: [id number] Patient Account Number: [id number] Date of Birth/Sex: [birth year] (69 y.o. 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 Date of Service: 05/22/2015 10:15 AM Medical Record Number: [id number] Patient Account Number: [id number] Date of Birth/Sex: 1946-03-30 (69 y.o. Male) Treating RN: Montey Hora Primary Care Physician: Ramonita Lab Other Clinician: Referring Physician: Briscoe Deutscher Treating Physician/Extender: Frann Rider in Treatment: 0 Subjective Chief Complaint Information obtained from Patient Patient presents to the wound care center for a consult due non healing wound. He had a lacerated wound to the left lower extremity on 04/25/2015 and this was sutured in the ER. He was placed on doxycycline orally and then when his sutures were removed the wound opened out for the last 10 days or so. History of Present Illness (HPI) The following HPI elements were documented for the patient's wound: Location: injury to the left lower extremity with wound open Quality: Patient reports experiencing a dull pain to affected area(s). Severity: Patient states wound are getting worse. Duration: Patient has had the wound for < 2 weeks prior to presenting for treatment Timing: Pain in wound is Intermittent (comes and goes Context: The wound occurred when the patient injured himself while working in the yard and then the sutured wound reopened once the sutures were removed. Modifying Factors: Consults to this date include:was on doxycycline which is now over. Associated Signs and Symptoms: Patient reports having difficulty standing for long periods. He had a lacerated wound to the left lower extremity on 04/25/2015 and this was sutured in the ER. He was placed on doxycycline orally and then when his sutures were removed the wound opened out for the last 10 days or so. 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 recently he has had his  anticoagulation stopped because of bleeding from the bladder tumor and has been put on thigh high compression stockings of the 20-30 mm mercury variety. He smokes over a pack of cigarettes a day. Wound History Patient presents with 1 open wound that has been present for approximately 1 month. Patient has been treating wound in the following manner: bandaid, stitches. Laboratory tests have been performed in the last month. Patient reportedly has not tested positive for an antibiotic resistant organism. Patient reportedly has not tested positive for osteomyelitis. Patient reportedly has had testing performed to evaluate circulation in the legs. Patient experiences the following problems associated with their wounds: swelling. Patient History Milos, Quashaun A. ([id number]) Information obtained from Patient. Allergies Levaquin, morphine, OxyContin, penicillin Social History Current every day smoker, Marital Status - Married, Alcohol Use - Never, Drug Use - No History, Caffeine Use - Never. Medical History Respiratory Patient has history of Asthma, Chronic Obstructive Pulmonary Disease (COPD) Cardiovascular Patient has history of Coronary Artery Disease, Deep Vein Thrombosis, Hypertension, Myocardial Infarction - 2000 Musculoskeletal Patient has history of Osteoarthritis Neurologic Patient has history of Neuropathy Oncologic Patient has history of Received Chemotherapy -  bladder Medical And Surgical History Notes Respiratory spot on my lung Endocrine pre diabetic - diet controlled - does not check CBGs Genitourinary kidney stone left kidney Musculoskeletal back injury with nerve damage Review of Systems (ROS) Constitutional Symptoms (General Health) The patient has no complaints or symptoms. Eyes The patient has no complaints or symptoms. Ear/Nose/Mouth/Throat The patient has no complaints or symptoms. Hematologic/Lymphatic The patient has no complaints or  symptoms. Cardiovascular Complains or has symptoms of LE edema. Gastrointestinal The patient has no complaints or symptoms. Genitourinary The patient has no complaints or symptoms. Immunological The patient has no complaints or symptoms. Integumentary (Skin) [name], Gabriel A. ([id number]) The patient has no complaints or symptoms. Musculoskeletal The patient has no complaints or symptoms. Neurologic The patient has no complaints or symptoms. Oncologic The patient has no complaints or symptoms. Psychiatric The patient has no complaints or symptoms. Medications bupivacaine injectine solution injectine diazepam 5 mg tablet oral 1 1 tablet oral warfarin 1 mg tablet oral tablet oral gabapentin 600 mg tablet oral 1 1 tablet oral hydroxyzine HCl 10 mg tablet oral 1 1 tablet oral promethazine 12.5 mg tablet oral 1 1 tablet oral fexofenadine 180 mg tablet oral 1 1 tablet oral simvastatin 40 mg tablet oral 1 1 tablet oral atenolol 25 mg tablet oral 1 1 tablet oral bisacodyl 5 mg tablet oral 1 1 tablet oral docusate sodium 100 mg capsule oral 1 1 capsule oral omega3-dha-epa-other omega3s-fish oil 360 mg-1,200 mg capsule oral 1 1 capsule oral magnesium oxide 400 mg tablet oral 1 1 tablet oral multivitamin capsule oral 1 1 capsule oral tizanidine 4 mg tablet oral tablet oral Vibramycin 100 mg capsule oral 1 1 capsule oral levothyroxine 75 mcg tablet oral 1 1 tablet oral amitriptyline 100 mg tablet oral 1 1 tablet oral Objective Constitutional Pulse regular. Respirations normal and unlabored. Afebrile. Vitals Time Taken: 10:27 AM, Height: 74 in, Source: Stated, Weight: 230 lbs, Source: Stated, BMI: 29.5, Temperature: 98.0 F, Pulse: 81 bpm, Respiratory Rate: 20 breaths/min, Blood Pressure: 120/79 mmHg. Eyes Nonicteric. Reactive to light. [name], Gabriel A. ([id number]) Ears, Nose, Mouth, and Throat Lips, teeth, and gums WNL.Marland [name] Moist mucosa without lesions . Neck supple and nontender. No  palpable supraclavicular or cervical adenopathy. Normal sized without goiter. Respiratory WNL. No retractions.. Cardiovascular Pedal Pulses WNL. ABI on the left was 1.34 in the right was 1.26. has bilateral edema of both lower extremities +1 pitting. Gastrointestinal (GI) Abdomen without masses or tenderness.. No liver or spleen enlargement or tenderness.. Lymphatic No adneopathy. No adenopathy. No adenopathy. Musculoskeletal Adexa without tenderness or enlargement.. Digits and nails w/o clubbing, cyanosis, infection, petechiae, ischemia, or inflammatory conditions.Marland [name] Psychiatric Judgement and insight Intact.. No evidence of depression, anxiety, or agitation.. General Notes: he has a wound covered with slough on the shin of the left mid anterior calf region and this need sharp debridement with a curette. Integumentary (Hair, Skin) No suspicious lesions. No crepitus or fluctuance. No peri-wound warmth or erythema. No masses.. Wound #1 status is Open. Original cause of wound was Trauma. The wound is located on the Left,Anterior Lower Leg. The wound measures 2.7cm length x 1.3cm width x 0.2cm depth; 2.757cm^2 area and 0.551cm^3 volume. The wound is limited to skin breakdown. There is no tunneling or undermining noted. There is a [name] amount of serous drainage noted. The wound margin is flat and intact. There is no granulation within the wound bed. There is a [name] (67-100%) amount of necrotic tissue within the wound bed including Eschar and  Adherent Slough. The periwound skin appearance exhibited: Localized Edema, Moist. The periwound skin appearance did not exhibit: Callus, Crepitus, Excoriation, Fluctuance, Friable, Induration, Rash, Scarring, Dry/Scaly, Maceration, Atrophie Blanche, Cyanosis, Ecchymosis, Hemosiderin Staining, Mottled, Pallor, Rubor, Erythema. Periwound temperature was noted as No Abnormality. Assessment Active Problems Baucum, Geronimo A. 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 He is already on 20-30 mm compression stockings of the thigh-high variety and we will encourage him to wear this. His wound will be addressed with Siltec Sorbact to be changed twice a week. I will also recommended elevation of the limb as often as possible and to use a couple of pillows at night to help with elevation. They've spent some time discussing cessation of smoking and I discussed the various methods and the patient is agreeable to work on this as he already has all the supplies he needs. All questions answered and he will come back and see as an regular basis. Procedures Wound #1 Wound #1 is a Trauma, Other located on the Left,Anterior Lower Leg . There was a Skin/Subcutaneous Tissue Debridement ([id number]) debridement with total area of 3.51 sq cm performed by Christin Fudge, MD. with the following instrument(s): Curette to remove Viable and Non-Viable tissue/material including Fibrin/Slough and Subcutaneous after achieving pain control using Lidocaine 4% Topical Solution. A time out was conducted prior to the start of the procedure. A Minimum amount of bleeding was controlled with Pressure. The procedure was tolerated well with a pain level of 0 throughout and a pain level of 0 following the procedure. Post Debridement Measurements: 2.7cm length x 1.3cm width x 0.2cm depth; 0.551cm^3 volume. Post procedure Diagnosis Wound #1: Same as Pre-Procedure Plan Wound Cleansing: Wound #1 Left,Anterior Lower  Leg: Clean wound with Normal Saline. ELRIDGE, STEMM ([id number]) May Shower, gently pat wound dry prior to applying new dressing. Anesthetic: Wound #1 Left,Anterior Lower Leg: Topical Lidocaine 4% cream applied to wound bed prior to debridement Skin Barriers/Peri-Wound Care: Wound #1 Left,Anterior Lower Leg: Skin Prep Primary Wound Dressing: Wound #1 Left,Anterior Lower Leg: Other: - siltec sorbact Dressing Change Frequency: Wound #1 Left,Anterior Lower Leg: Other: - change when drainage reaches 1cm from edges of dressing Edema Control: Wound #1 Left,Anterior Lower Leg: Patient to wear own compression stockings Elevate legs to the level of the heart and pump ankles as often as possible Additional Orders / Instructions: Wound #1 Left,Anterior Lower Leg: Stop Smoking This gentleman has seen the vascular surgeon in the past and we will try and obtain his vascular workup reports. He is already on 20-30 mm compression stockings of the thigh-high variety and we will encourage him to wear this. His wound will be addressed with Siltec Sorbact to be changed twice a week. I will also recommended elevation of the limb as often as possible and to use a couple of pillows at night to help with elevation. They've spent some time discussing cessation of smoking and I discussed the various methods and the patient is agreeable to work on this as he already has all the supplies he needs. All questions answered and he will come back and  see as an regular basis. Electronic Signature(s) Signed: 05/22/2015 11:36:04 AM By: Christin Fudge MD, FACS Previous Signature: 05/22/2015 11:34:06 AM Version By: Christin Fudge MD, FACS Entered By: Christin Fudge on 05/22/2015 11:36:03 [name], [name] ([id number]) -------------------------------------------------------------------------------- ROS/PFSH Details Patient Name: Dreisbach, Terrion A. Date of Service: 05/22/2015 10:15 AM Medical Record Number:  [id number] Patient Account Number: [id number] Date of Birth/Sex: 05-11-1946 (69 y.o. Male) Treating RN: Montey Hora Primary Care Physician: Ramonita Lab Other Clinician: Referring Physician: Briscoe Deutscher Treating Physician/Extender: Frann Rider in Treatment: 0 Information Obtained From Patient Wound History Do you currently have one or more open woundso Yes How many open wounds do you currently haveo 1 Approximately how long have you had your woundso 1 month How have you been treating your wound(s) until nowo bandaid, stitches Has your wound(s) ever healed and then re-openedo No Have you had any lab work done in the past montho Yes Who ordered the lab work [facility] Have you tested positive for an antibiotic resistant organism (MRSA, VRE)o No Have you tested positive for osteomyelitis (bone infection)o No Have you had any tests for circulation on your legso Yes Who ordered the testo PCP Where was the test doneo [facility] Have you had other problems associated with your woundso Swelling Cardiovascular Complaints and Symptoms: Positive for: LE edema Medical History: Positive for: Coronary Artery Disease; Deep Vein Thrombosis; Hypertension; Myocardial Infarction - 2000 Constitutional Symptoms (General Health) Complaints and Symptoms: No Complaints or Symptoms Eyes Complaints and Symptoms: No Complaints or Symptoms Ear/Nose/Mouth/Throat Complaints and Symptoms: No Complaints or Symptoms Hematologic/Lymphatic Cumba, Detrich A. ([id number]) Complaints and Symptoms: No Complaints or Symptoms Respiratory Medical History: Positive for: Asthma; Chronic Obstructive Pulmonary Disease (COPD) Past Medical History Notes: spot on my lung Gastrointestinal Complaints and Symptoms: No Complaints or Symptoms Endocrine Medical History: Past Medical History Notes: pre diabetic - diet controlled - does not check CBGs Genitourinary Complaints and Symptoms: No Complaints or  Symptoms Medical History: Past Medical History Notes: kidney stone left kidney Immunological Complaints and Symptoms: No Complaints or Symptoms Integumentary (Skin) Complaints and Symptoms: No Complaints or Symptoms Musculoskeletal Complaints and Symptoms: No Complaints or Symptoms Medical History: Positive for: Osteoarthritis Past Medical History Notes: back injury with nerve damage Neurologic Friday, Rino A. ([id number]) Complaints and Symptoms: No Complaints or Symptoms Medical History: Positive for: Neuropathy Oncologic Complaints and Symptoms: No Complaints or Symptoms Medical History: Positive for: Received Chemotherapy - bladder Psychiatric Complaints and Symptoms: No Complaints or Symptoms Family and Social History Current every day smoker; Marital Status - Married; Alcohol Use: Never; Drug Use: No History; Caffeine Use: Never; Financial Concerns: No; Food, Clothing or Shelter Needs: No; Support System Lacking: No; Transportation Concerns: No; Advanced Directives: No; Patient does not want information on Advanced Directives Physician Affirmation I have reviewed and agree with the above information. Electronic Signature(s) Signed: 05/22/2015 10:37:02 AM By: Christin Fudge MD, FACS Signed: 05/22/2015 5:18:22 PM By: Montey Hora Entered By: Christin Fudge on 05/22/2015 10:37:02 Langdon, Terik [name] [name] ([id number]) -------------------------------------------------------------------------------- SuperBill Details Patient Name: Deshotels, Broadus A. Date of Service: 05/22/2015 Medical Record Number: [id number] Patient Account Number: [id number] Date of Birth/Sex: Dec 14, 1945 (69 y.o. 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 I73.9 Peripheral vascular disease, unspecified Facility Procedures CPT4: Description Modifier Quantity Code [id number] 99213 - WOUND CARE VISIT-LEV 3 EST PT 1 CPT4: 19509326 11042 - DEB SUBQ TISSUE 20 SQ CM/< 1 ICD-10 Description Diagnosis S81.812A Laceration without foreign body, left lower leg, initial encounter F17.218 Nicotine dependence, cigarettes, with other nicotine-induced disorders 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.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 ([id number]) Electronic Signature(s) Signed: 05/22/2015 11:35:06 AM By: Christin Fudge MD, FACS Entered By: Christin Fudge on 05/22/2015 11:35:06

## 2015-05-23 NOTE — Progress Notes (Signed)
[name] ([id number]) Visit Report for 05/22/2015 Allergy List Details Patient Name: Gabriel, Gabriel A. Date of Service: 05/22/2015 10:15 AM Medical Record Number: [id number] Patient Account Number: [id number] Date of Birth/Sex: 03/20/1946 (69 y.o. Male) Treating RN: Montey Hora Primary Care Physician: Ramonita Lab Other Clinician: Referring Physician: Briscoe Deutscher Treating Physician/Extender: Frann Rider in Treatment: 0 Allergies Active Allergies Levaquin morphine OxyContin penicillin Allergy Notes Electronic Signature(s) Signed: 05/22/2015 5:18:22 PM By: Montey Hora Entered By: Montey Hora on 05/22/2015 10:30:07 [name], [name] ([id number]) -------------------------------------------------------------------------------- Arrival Information Details Patient Name: [name], [name] Date of Service: 05/22/2015 10:15 AM Medical Record Number: [id number] Patient Account Number: [id number] Date of Birth/Sex: 1946-05-30 (69 y.o. Male) Treating RN: Montey Hora Primary Care Physician: Ramonita Lab Other Clinician: Referring Physician: Briscoe Deutscher Treating Physician/Extender: Frann Rider in Treatment: 0 Visit Information Patient Arrived: Charlyn Minerva Time: 10:25 Accompanied By: wife Transfer Assistance: None Patient Identification Verified: Yes Secondary Verification Process Completed: Yes Electronic Signature(s) Signed: 05/22/2015 5:18:22 PM By: Montey Hora Entered By: Montey Hora on 05/22/2015 10:27:13 [name], [name] ([id number]) -------------------------------------------------------------------------------- Clinic Level of Care Assessment Details Patient Name: [name], [name] Date of Service: 05/22/2015 10:15 AM Medical Record Number: [id number] Patient Account Number: [id number] Date of Birth/Sex: 12-18-1945 (69 y.o. Male) Treating RN: Montey Hora Primary Care Physician: Ramonita Lab Other Clinician: Referring Physician: Briscoe Deutscher Treating Physician/Extender: Frann Rider in Treatment: 0 Clinic Level of Care Assessment Items TOOL 1 Quantity Score []  - Use when EandM and Procedure is performed on INITIAL visit 0 ASSESSMENTS - Nursing Assessment / Reassessment X - General Physical Exam (combine w/ comprehensive assessment (listed just 1 20 below) when performed on new pt. evals) X - Comprehensive Assessment (HX, ROS, Risk Assessments, Wounds Hx, etc.) 1 25 ASSESSMENTS - Wound and Skin Assessment / Reassessment []  - Dermatologic / Skin Assessment (not related to wound area) 0 ASSESSMENTS - Ostomy and/or Continence Assessment and Care []  - Incontinence Assessment and Management 0 []  - Ostomy Care Assessment and Management (repouching, etc.) 0 PROCESS - Coordination of Care X - Simple Patient / Family Education for ongoing care 1 15 []  - Complex (extensive) Patient / Family Education for ongoing care 0 X - Staff obtains Consents, Records, Test Results / Process Orders 1 10 []  - Staff telephones HHA, Nursing Homes / Clarify orders / etc 0 []  - Routine Transfer to another Facility (non-emergent condition) 0 []  - Routine Hospital Admission (non-emergent condition) 0 X - New Admissions / Biomedical engineer / Ordering NPWT, Apligraf, etc. 1 15 []  - Emergency Hospital Admission (emergent condition) 0 PROCESS - Special Needs []  - Pediatric / Minor Patient Management 0 []  - Isolation Patient Management 0 [name], [name] ([id number]) []  - Hearing / Language / Visual special needs 0 []  - Assessment of Community assistance (transportation, D/C planning, etc.) 0 []  - Additional assistance / Altered mentation 0 []  - Support Surface(s) Assessment (bed, cushion, seat, etc.) 0 INTERVENTIONS - Miscellaneous []  - External ear exam 0 []  - Patient Transfer (multiple staff / Civil Service fast streamer / Similar devices) 0 []  - Simple Staple / Suture removal (25 or less) 0 []  - Complex Staple / Suture removal (26 or more) 0 []  -  Hypo/Hyperglycemic Management (do not check if billed separately) 0 X - Ankle / Brachial Index (ABI) - do not check if billed separately 1 15 Has the patient been seen at the hospital within the last three years: Yes Total Score: 100 Level Of Care: New/Established - Level  3 Electronic Signature(s) Signed: 05/22/2015 5:18:22 PM By: Montey Hora Entered By: Montey Hora on 05/22/2015 11:06:17 [name], [name] ([id number]) -------------------------------------------------------------------------------- Encounter Discharge Information Details Patient Name: [name], [name] Date of Service: 05/22/2015 10:15 AM Medical Record Number: [id number] Patient Account Number: [id number] Date of Birth/Sex: October 16, 1945 (69 y.o. Male) Treating RN: Montey Hora Primary Care Physician: Ramonita Lab Other Clinician: Referring Physician: Briscoe Deutscher Treating Physician/Extender: Frann Rider in Treatment: 0 Encounter Discharge Information Items Discharge Pain Level: 0 Discharge Condition: Stable Ambulatory Status: Walker Discharge Destination: Home Transportation: Private Auto Accompanied By: spouse Schedule Follow-up Appointment: Yes Medication Reconciliation completed and provided to Patient/Care No [name]: Provided on Clinical Summary of Care: 05/22/2015 Form Type Recipient Paper Patient DM Electronic Signature(s) Signed: 05/22/2015 11:25:42 AM By: Ruthine Dose Entered By: Ruthine Dose on 05/22/2015 11:25:42 [name], [name] ([id number]) -------------------------------------------------------------------------------- Lower Extremity Assessment Details Patient Name: [name], [name] Date of Service: 05/22/2015 10:15 AM Medical Record Number: [id number] Patient Account Number: [id number] Date of Birth/Sex: 16-Jun-1946 (69 y.o. Male) Treating RN: Montey Hora Primary Care Physician: Ramonita Lab Other Clinician: Referring Physician: Briscoe Deutscher Treating Physician/Extender:  Frann Rider in Treatment: 0 Edema Assessment Assessed: [Left: No] [Right: No] Edema: [Left: Yes] [Right: Yes] Calf Left: Right: Point of Measurement: 37 cm From Medial Instep 39.6 cm 38 cm Ankle Left: Right: Point of Measurement: 12 cm From Medial Instep 24.8 cm 25 cm Vascular Assessment Claudication: Claudication Assessment [Left:Intermittent] [Right:Intermittent] Pulses: Posterior Tibial Palpable: [Left:No] [Right:No] Dorsalis Pedis Palpable: [Left:No] [Right:No] Extremity colors, hair growth, and conditions: Extremity Color: [Left:Hyperpigmented] [Right:Hyperpigmented] Hair Growth on Extremity: [Left:No] [Right:No] Temperature of Extremity: [Left:Warm] [Right:Warm] Capillary Refill: [Left:< 3 seconds] [Right:< 3 seconds] Blood Pressure: Brachial: [Left:114] [Right:116] Dorsalis Pedis: 156 [Left:Dorsalis Pedis: 122] Ankle: Posterior Tibial: 150 [Left:Posterior Tibial: 158 1.34] [Right:1.36] Toe Nail Assessment Left: Right: Thick: Yes Yes Discolored: No No Deformed: No No Improper Length and Hygiene: No No Kelner, Jefferson A. ([id number]) Electronic Signature(s) Signed: 05/22/2015 5:18:22 PM By: Montey Hora Entered By: Montey Hora on 05/22/2015 10:55:16 [name], [name] ([id number]) -------------------------------------------------------------------------------- Multi Wound Chart Details Patient Name: Kwong, Mitch A. Date of Service: 05/22/2015 10:15 AM Medical Record Number: [id number] Patient Account Number: [id number] Date of Birth/Sex: Dec 24, 1945 (69 y.o. Male) Treating RN: Montey Hora Primary Care Physician: Ramonita Lab Other Clinician: Referring Physician: Briscoe Deutscher Treating Physician/Extender: Frann Rider in Treatment: 0 Vital Signs Height(in): 74 Pulse(bpm): 81 Weight(lbs): 230 Blood Pressure 120/79 (mmHg): Body Mass Index(BMI): 30 Temperature(F): 98.0 Respiratory Rate 20 (breaths/min): Photos: [1:No Photos]  [N/A:N/A] Wound Location: [1:Left Lower Leg - Anterior] [N/A:N/A] Wounding Event: [1:Trauma] [N/A:N/A] Primary Etiology: [1:Trauma, Other] [N/A:N/A] Comorbid History: [1:Asthma, Chronic Obstructive Pulmonary Disease (COPD), Coronary Artery Disease, Deep Vein Thrombosis, Hypertension, Myocardial Infarction, Osteoarthritis, Neuropathy, Received Chemotherapy] [N/A:N/A] Date Acquired: [1:04/21/2015] [N/A:N/A] Weeks of Treatment: [1:0] [N/A:N/A] Wound Status: [1:Open] [N/A:N/A] Measurements L x W x D 2.7x1.3x0.2 [N/A:N/A] (cm) Area (cm) : [1:2.757] [N/A:N/A] Volume (cm) : [1:0.551] [N/A:N/A] Classification: [1:Full Thickness Without Exposed Support Structures] [N/A:N/A] Exudate Amount: [1:Large] [N/A:N/A] Exudate Type: [1:Serous] [N/A:N/A] Exudate Color: [1:amber] [N/A:N/A] Wound Margin: [1:Flat and Intact] [N/A:N/A] Granulation Amount: [1:None Present (0%)] [N/A:N/A] Necrotic Amount: [1:Large (67-100%)] [N/A:N/A] Necrotic Tissue: [1:Eschar, Adherent Slough] [N/A:N/A] Exposed Structures: Fascia: No N/A N/A Fat: No Tendon: No Muscle: No Joint: No Bone: No Limited to Skin Breakdown Epithelialization: None N/A N/A Periwound Skin Texture: Edema: Yes N/A N/A Excoriation: No Induration: No Callus: No Crepitus: No Fluctuance: No Friable: No Rash: No Scarring: No Periwound Skin Moist: Yes N/A N/A  Moisture: Maceration: No Dry/Scaly: No Periwound Skin Color: Atrophie Blanche: No N/A N/A Cyanosis: No Ecchymosis: No Erythema: No Hemosiderin Staining: No Mottled: No Pallor: No Rubor: No Temperature: No Abnormality N/A N/A Tenderness on No N/A N/A Palpation: Wound Preparation: Ulcer Cleansing: N/A N/A Rinsed/Irrigated with Saline Topical Anesthetic Applied: Other: lidocaine 4% Treatment Notes Electronic Signature(s) Signed: 05/22/2015 5:18:22 PM By: Montey Hora Entered By: Montey Hora on 05/22/2015 11:03:49 [name], [name]  ([id number]) -------------------------------------------------------------------------------- [place] Details Patient Name: [name], Gabriel A. Date of Service: 05/22/2015 10:15 AM Medical Record Number: [id number] Patient Account Number: [id number] Date of Birth/Sex: December 29, 1945 (69 y.o. Male) Treating RN: Montey Hora Primary Care Physician: Ramonita Lab Other Clinician: Referring Physician: Briscoe Deutscher Treating Physician/Extender: Frann Rider in Treatment: 0 Active Inactive Abuse / Safety / Falls / Self Care Management Nursing Diagnoses: Impaired physical mobility Potential for falls Goals: Patient will remain injury free Date Initiated: 05/22/2015 Goal Status: Active Interventions: Assess fall risk on admission and as needed Notes: Orientation to the Wound Care Program Nursing Diagnoses: Knowledge deficit related to the wound healing center program Goals: Patient/caregiver will verbalize understanding of the [facility] Date Initiated: 05/22/2015 Goal Status: Active Interventions: Provide education on orientation to the wound center Notes: Venous Leg Ulcer Nursing Diagnoses: Potential for venous Insuffiency (use before diagnosis confirmed) Goals: Patient will maintain optimal edema control EMMAUS, BRANDI A. ([id number]) Date Initiated: 05/22/2015 Goal Status: Active Interventions: Assess peripheral edema status every visit. Notes: Wound/Skin Impairment Nursing Diagnoses: Impaired tissue integrity Goals: Ulcer/skin breakdown will have a volume reduction of 30% by week 4 Date Initiated: 05/22/2015 Goal Status: Active Ulcer/skin breakdown will have a volume reduction of 50% by week 8 Date Initiated: 05/22/2015 Goal Status: Active Ulcer/skin breakdown will have a volume reduction of 80% by week 12 Date Initiated: 05/22/2015 Goal Status: Active Ulcer/skin breakdown will heal within 14 weeks Date Initiated:  05/22/2015 Goal Status: Active Interventions: Assess ulceration(s) every visit Notes: Electronic Signature(s) Signed: 05/22/2015 5:18:22 PM By: Montey Hora Entered By: Montey Hora on 05/22/2015 11:03:13 Bieda, Ardian Loni [name] ([id number]) -------------------------------------------------------------------------------- Patient/Caregiver Education Details Patient Name: Gabriel Axe A. Date of Service: 05/22/2015 10:15 AM Medical Record Number: [id number] Patient Account Number: [id number] Date of Birth/Gender: May 06, 1946 (69 y.o. Male) Treating RN: Montey Hora Primary Care Physician: Ramonita Lab Other Clinician: Referring Physician: Briscoe Deutscher Treating Physician/Extender: Frann Rider in Treatment: 0 Education Assessment Education Provided To: Patient and Caregiver Education Topics Provided Smoking and Wound Healing: Handouts: Smoking and Wound Healing Methods: Explain/Verbal Responses: State content correctly Venous: Handouts: Controlling Swelling with Compression Stockings Methods: Explain/Verbal Responses: State content correctly Wound/Skin Impairment: Handouts: Other: wound care as ordered Methods: Demonstration, Explain/Verbal Responses: State content correctly Electronic Signature(s) Signed: 05/22/2015 5:18:22 PM By: Montey Hora Entered By: Montey Hora on 05/22/2015 11:11:43 [name], Gabriel A. ([id number]) -------------------------------------------------------------------------------- Wound Assessment Details Patient Name: [name], Gabriel A. Date of Service: 05/22/2015 10:15 AM Medical Record Number: [id number] Patient Account Number: [id number] Date of Birth/Sex: May 15, 1946 (69 y.o. Male) Treating RN: Montey Hora Primary Care Physician: Ramonita Lab Other Clinician: Referring Physician: Briscoe Deutscher Treating Physician/Extender: Frann Rider in Treatment: 0 Wound Status Wound Number: 1 Primary Trauma, Other Etiology: Wound Location: Left  Lower Leg - Anterior Wound Open Wounding Event: Trauma Status: Date Acquired: 04/21/2015 Comorbid Asthma, Chronic Obstructive Pulmonary Weeks Of Treatment: 0 History: Disease (COPD), Coronary Artery Clustered Wound: No Disease, Deep Vein Thrombosis, Hypertension, Myocardial Infarction, Osteoarthritis, Neuropathy, Received Chemotherapy Photos Photo Uploaded By: Montey Hora on 05/22/2015 11:58:24 Wound  Measurements Length: (cm) 2.7 Width: (cm) 1.3 Depth: (cm) 0.2 Area: (cm) 2.757 Volume: (cm) 0.551 % Reduction in Area: % Reduction in Volume: Epithelialization: None Tunneling: No Undermining: No Wound Description Full Thickness Without Exposed Classification: Support Structures Wound Margin: Flat and Intact Exudate Large Amount: Exudate Type: Serous Exudate Color: amber [name], Gabriel A. ([id number]) Foul Odor After Cleansing: No Wound Bed Granulation Amount: None Present (0%) Exposed Structure Necrotic Amount: Large (67-100%) Fascia Exposed: No Necrotic Quality: Eschar, Adherent Slough Fat Layer Exposed: No Tendon Exposed: No Muscle Exposed: No Joint Exposed: No Bone Exposed: No Limited to Skin Breakdown Periwound Skin Texture Texture Color No Abnormalities Noted: No No Abnormalities Noted: No Callus: No Atrophie Blanche: No Crepitus: No Cyanosis: No Excoriation: No Ecchymosis: No Fluctuance: No Erythema: No Friable: No Hemosiderin Staining: No Induration: No Mottled: No Localized Edema: Yes Pallor: No Rash: No Rubor: No Scarring: No Temperature / Pain Moisture Temperature: No Abnormality No Abnormalities Noted: No Dry / Scaly: No Maceration: No Moist: Yes Wound Preparation Ulcer Cleansing: Rinsed/Irrigated with Saline Topical Anesthetic Applied: Other: lidocaine 4%, Treatment Notes Wound #1 (Left, Anterior Lower Leg) 1. Cleansed with: Clean wound with Normal Saline 2. Anesthetic Topical Lidocaine 4% cream to wound bed prior to  debridement 3. Peri-wound Care: Skin Prep 4. Dressing Applied: Other dressing (specify in notes) Notes siltec sorbact Electronic Signature(s) JUANJOSE, MOJICA ([id number]) Signed: 05/22/2015 5:18:22 PM By: Montey Hora Entered By: Montey Hora on 05/22/2015 10:44:53 Pelphrey, Dmani [name] [name] ([id number]) -------------------------------------------------------------------------------- Vitals Details Patient Name: Nicosia, Alice A. Date of Service: 05/22/2015 10:15 AM Medical Record Number: [id number] Patient Account Number: [id number] Date of Birth/Sex: July 05, 1946 (69 y.o. Male) Treating RN: Montey Hora Primary Care Physician: Ramonita Lab Other Clinician: Referring Physician: Briscoe Deutscher Treating Physician/Extender: Frann Rider in Treatment: 0 Vital Signs Time Taken: 10:27 Temperature (F): 98.0 Height (in): 74 Pulse (bpm): 81 Source: Stated Respiratory Rate (breaths/min): 20 Weight (lbs): 230 Blood Pressure (mmHg): 120/79 Source: Stated Reference Range: 80 - 120 mg / dl Body Mass Index (BMI): 29.5 Electronic Signature(s) Signed: 05/22/2015 5:18:22 PM By: Montey Hora Entered By: Montey Hora on 05/22/2015 10:29:16

## 2015-05-29 ENCOUNTER — Encounter: Payer: Medicare Other | Attending: Surgery | Admitting: Surgery

## 2015-05-29 DIAGNOSIS — J449 Chronic obstructive pulmonary disease, unspecified: Secondary | ICD-10-CM | POA: Diagnosis not present

## 2015-05-29 DIAGNOSIS — X58XXXA Exposure to other specified factors, initial encounter: Secondary | ICD-10-CM | POA: Insufficient documentation

## 2015-05-29 DIAGNOSIS — F17218 Nicotine dependence, cigarettes, with other nicotine-induced disorders: Secondary | ICD-10-CM | POA: Diagnosis not present

## 2015-05-29 DIAGNOSIS — L97222 Non-pressure chronic ulcer of left calf with fat layer exposed: Secondary | ICD-10-CM | POA: Diagnosis not present

## 2015-05-29 DIAGNOSIS — I82592 Chronic embolism and thrombosis of other specified deep vein of left lower extremity: Secondary | ICD-10-CM | POA: Diagnosis not present

## 2015-05-29 DIAGNOSIS — G629 Polyneuropathy, unspecified: Secondary | ICD-10-CM | POA: Diagnosis not present

## 2015-05-29 DIAGNOSIS — I509 Heart failure, unspecified: Secondary | ICD-10-CM | POA: Insufficient documentation

## 2015-05-29 DIAGNOSIS — Z7901 Long term (current) use of anticoagulants: Secondary | ICD-10-CM | POA: Diagnosis not present

## 2015-05-29 DIAGNOSIS — I10 Essential (primary) hypertension: Secondary | ICD-10-CM | POA: Insufficient documentation

## 2015-05-29 DIAGNOSIS — I739 Peripheral vascular disease, unspecified: Secondary | ICD-10-CM | POA: Diagnosis not present

## 2015-05-29 DIAGNOSIS — S81812A Laceration without foreign body, left lower leg, initial encounter: Secondary | ICD-10-CM | POA: Insufficient documentation

## 2015-05-29 DIAGNOSIS — J45909 Unspecified asthma, uncomplicated: Secondary | ICD-10-CM | POA: Diagnosis not present

## 2015-05-29 NOTE — Progress Notes (Addendum)
[name] ([id number]) Visit Report for 05/29/2015 Chief Complaint Document Details Patient Name: [name], Gabriel A. Date of Service: 05/29/2015 2:00 PM Medical Record Number: [id number] Patient Account Number: [id number] Date of Birth/Sex: 06-22-1946 (69 y.o. Male) Treating RN: [name] Primary Care Physician: [name] Other Clinician: Referring Physician: Ramonita [name] Treating Physician/Extender: [name] in Treatment: 1 Information Obtained from: Patient Chief Complaint Patient presents to the wound care center for a consult due non healing wound. He had a lacerated wound to the left lower extremity on 04/25/2015 and this was sutured in the ER. He was placed on doxycycline orally and then when his sutures were removed the wound opened out for the last 10 days or so. Electronic Signature(s) Signed: 05/29/2015 2:11:07 PM By: Gabriel Fudge MD, FACS Entered By: [name] on 05/29/2015 14:11:07 [name] [name] Kitchen ([id number]) -------------------------------------------------------------------------------- HPI Details Patient Name: [name], Gabriel A. Date of Service: 05/29/2015 2:00 PM Medical Record Number: [id number] Patient Account Number: [id number] Date of Birth/Sex: [birth year] (69 y.o. Male) Treating RN: [name] Primary Care Physician: [name] Other Clinician: Referring Physician: Ramonita [name] Treating Physician/Extender: [name] in Treatment: 1 History of Present Illness Location: injury to the left lower extremity with wound open Quality: Patient reports experiencing a dull pain to affected area(s). Severity: Patient states wound are getting worse. Duration: Patient has had the wound for < 2 weeks prior to presenting for treatment Timing: Pain in wound is Intermittent (comes and goes Context: The wound occurred when the patient injured himself while working in the yard and then the sutured wound reopened once the sutures were removed. Modifying  Factors: Consults to this date include:was on doxycycline which is now over. Associated Signs and Symptoms: Patient reports having difficulty standing for long periods. HPI Description: He had a lacerated wound to the left lower extremity on 04/25/2015 and this was sutured in the ER. He was placed on doxycycline orally and then when his sutures were removed the wound opened out for the last 10 days or so. 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 recently he has had his anticoagulation stopped because of bleeding from the bladder tumor and has been put on thigh high compression stockings of the 20-30 mm mercury variety. He smokes over a pack of cigarettes a day. 05/29/2015 -- He has not yet given up smoking and continues to try to work with this. He had a question whether he can use his lymphedema pumps. Electronic Signature(s) Signed: 05/29/2015 2:24:57 PM By: Gabriel Fudge MD, FACS Previous Signature: 05/29/2015 2:11:25 PM Version By: Gabriel Fudge MD, FACS Entered By: [name] on 05/29/2015 14:24:56 [name], [name] ([id number]) -------------------------------------------------------------------------------- Physical Exam Details Patient Name: [name], Gabriel A. Date of Service: 05/29/2015 2:00 PM Medical Record Number: [id number] Patient Account Number: [id number] Date of Birth/Sex: 1946-04-19 (69 y.o. Male) Treating RN: [name] Primary Care Physician: [name] Other Clinician: Referring Physician: Ramonita [name] Treating Physician/Extender: [name] in Treatment: 1 Constitutional . Pulse regular. Respirations normal and  unlabored. Afebrile. . Eyes Nonicteric. Reactive to light. Ears, Nose, Mouth, and Throat Lips, teeth, and gums WNL.Marland Kitchen Moist mucosa without lesions . Neck supple and nontender. No palpable supraclavicular or cervical adenopathy. Normal sized without goiter. Respiratory WNL. No retractions.. Cardiovascular Pedal Pulses  WNL. No clubbing, cyanosis or edema. Chest Breasts symmetical and no nipple discharge.. Breast tissue WNL, no masses, lumps, or tenderness.. Lymphatic No adneopathy. No adenopathy. No adenopathy. Musculoskeletal Adexa without tenderness or enlargement.. Digits and nails w/o clubbing, cyanosis, infection, petechiae, ischemia, or inflammatory conditions.. Integumentary (Hair, Skin) No suspicious lesions. No crepitus or fluctuance. No peri-wound warmth or erythema. No masses.Marland Kitchen Psychiatric Judgement and insight Intact.. No evidence of depression, anxiety, or agitation.. Notes wound looks very clean and there is minimal slough and the surrounding skin is looking good. No curettage was required today. Electronic Signature(s) Signed: 05/29/2015 2:25:51 PM By: Gabriel Fudge MD, FACS Entered By: [name] on 05/29/2015 14:25:51 [name], [name] ([id number]) -------------------------------------------------------------------------------- Physician Orders Details Patient Name: Gabriel Axe A. Date of Service: 05/29/2015 2:00 PM Medical Record Number: [id number] Patient Account Number: [id number] Date of Birth/Sex: 1945-11-16 (69 y.o. 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 o May Shower, gently pat wound dry prior to applying new dressing. Anesthetic Wound #1 Left,Anterior Lower Leg o Topical Lidocaine 4% cream applied to wound bed prior to debridement Skin Barriers/Peri-Wound Care Wound #1 Left,Anterior Lower Leg o Skin Prep Primary Wound Dressing Wound #1 Left,Anterior Lower Leg o Other: - siltec sorbact Dressing Change Frequency Wound #1 Left,Anterior Lower Leg o Other: - change when drainage reaches 1cm from edges of dressing Edema Control Wound #1 Left,Anterior Lower Leg o Patient to wear own compression stockings o Elevate legs to the level of the heart and pump ankles as often as possible Vu, Ashtyn A. ([id number]) Additional Orders / Instructions Wound #1 Left,Anterior Lower Leg o Stop Smoking Electronic Signature(s) Signed: 05/29/2015 4:02:36 PM By: Gabriel Fudge MD, FACS Signed: 05/29/2015 4:42:36 PM By: Gretta Cool RN, BSN, Kim RN, BSN Entered By: Gretta Cool, RN, BSN, Kim on 05/29/2015 14:22:56 [place], [name] ([id number]) -------------------------------------------------------------------------------- Problem List Details Patient Name: [name], Gabriel A. Date of Service: 05/29/2015 2:00 PM Medical Record Number: [id number] Patient Account Number: [id number] Date of Birth/Sex: 1946-01-21 (69 y.o. Male) Treating RN: [name] Primary Care Physician: [name] Other Clinician: Referring Physician: Ramonita [name] Treating Physician/Extender: [name] in Treatment: 1 Active Problems ICD-10 Encounter Code Description Active Date Diagnosis S81.812A Laceration without foreign body, left lower leg, initial 05/22/2015  Yes encounter L97.222 Non-pressure chronic ulcer of left calf with fat layer 05/22/2015 Yes exposed F17.218 Nicotine dependence, cigarettes, with other nicotine- 05/22/2015 Yes induced disorders I82.592 Chronic embolism and thrombosis of other specified deep 05/22/2015 Yes vein of left lower extremity I73.9 Peripheral vascular disease, unspecified 05/22/2015 Yes Inactive Problems Resolved Problems Electronic Signature(s) Signed: 05/29/2015 2:10:54 PM By: Gabriel Fudge MD, FACS Entered By: [name] on 05/29/2015 14:10:53 Link, [place] [name] Kitchen ([id number]) -------------------------------------------------------------------------------- Progress Note Details Patient Name: [name], Gabriel A. Date of Service: 05/29/2015 2:00 PM Medical Record Number: [id number] Patient Account Number: [id number] Date of Birth/Sex: October 01, 1945 (69 y.o. Male) Treating RN: [name] Primary Care Physician: [name] Other Clinician: Referring Physician: Caryl Comes  BERT Treating Physician/Extender: [name] in Treatment: 1 Subjective Chief Complaint Information obtained from Patient Patient presents to the wound care center for a consult due non healing wound. He had a lacerated wound to the left lower extremity on 04/25/2015 and this was sutured in the ER. He was placed on doxycycline orally and then when his sutures were removed the wound opened out for the last 10 days or so. History of Present Illness (HPI) The following HPI elements were documented for the patient's wound: Location: injury to the left lower extremity with wound open Quality: Patient reports experiencing a dull pain to affected area(s). Severity: Patient states wound are getting worse. Duration: Patient has had the wound for < 2 weeks prior to presenting for treatment Timing: Pain in wound is Intermittent (comes and goes Context: The wound occurred when the patient injured himself while working in the yard and then the sutured  wound reopened once the sutures were removed. Modifying Factors: Consults to this date include:was on doxycycline which is now over. Associated Signs and Symptoms: Patient reports having difficulty standing for long periods. He had a lacerated wound to the left lower extremity on 04/25/2015 and this was sutured in the ER. He was placed on doxycycline orally and then when his sutures were removed the wound opened out for the last 10 days or so. 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 recently he has had his anticoagulation stopped because of bleeding from the bladder tumor and has been put on thigh high compression stockings of the 20-30 mm mercury variety. He smokes over a pack of cigarettes a day. 05/29/2015 -- He has not yet given up smoking and continues to try to work with this. He had a question whether he can use his lymphedema pumps. Objective [name], Gabriel A. ([id number]) Constitutional Pulse regular. Respirations normal and unlabored. Afebrile. Vitals Time Taken: 2:02 PM, Height: 74 in, Weight: 230 lbs, BMI: 29.5, Temperature: 98.0 F, Pulse: 64 bpm, Respiratory Rate: 18 breaths/min, Blood Pressure: 121/62 mmHg. Eyes Nonicteric. Reactive to light. Ears, Nose, Mouth, and Throat Lips, teeth, and gums WNL.Marland Kitchen Moist mucosa without lesions . Neck supple and nontender. No palpable supraclavicular or cervical adenopathy. Normal sized without goiter. Respiratory WNL. No retractions.. Cardiovascular Pedal Pulses WNL. No clubbing, cyanosis or edema. Chest Breasts symmetical and no nipple discharge.. Breast tissue WNL, no masses, lumps, or  tenderness.. Lymphatic No adneopathy. No adenopathy. No adenopathy. Musculoskeletal Adexa without tenderness or enlargement.. Digits and nails w/o clubbing, cyanosis, infection, petechiae, ischemia, or inflammatory conditions.Marland Kitchen Psychiatric Judgement and insight Intact.. No evidence of depression, anxiety, or agitation.. General Notes: wound looks very clean and there is minimal slough and the surrounding skin is looking good. No curettage was required today. Integumentary (Hair, Skin) No suspicious lesions. No crepitus or fluctuance. No peri-wound warmth or erythema. No masses.. Wound #1 status is Open. Original cause of wound was Trauma. The wound is located on the Left,Anterior Lower Leg. The wound measures 2cm length x 0.5cm width x 0.1cm depth; 0.785cm^2 area and 0.079cm^3 volume. The wound is limited to skin breakdown. There is a large amount of serous drainage noted. The wound margin is flat and intact. There is no granulation within the wound bed. There is a large (67-100%) amount of  necrotic tissue within the wound bed including Adherent Slough. The periwound skin appearance exhibited: Localized Edema, Moist, Hemosiderin Staining. The periwound skin appearance did not exhibit: Callus, Crepitus, Excoriation, Fluctuance, Friable, Induration, Rash, Scarring, Dry/Scaly, Maceration, Atrophie Blanche, Cyanosis, Ecchymosis, Mottled, Pallor, Rubor, Erythema. Periwound [name], Gabriel A. ([id number]) temperature was noted as No Abnormality. 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 He is already on 20-30 mm compression stockings of the thigh-high variety and we will encourage him to wear this. His wound will be addressed with Siltec Sorbact to be changed twice a week. I will also recommended elevation of the limb as often as possible and to use a couple of pillows at night to help with elevation. They've spent some time discussing cessation of smoking and I discussed the various methods and the patient is agreeable to work on this as he already has all the supplies he needs. All questions answered and he will come back and see as an regular basis. Plan Wound Cleansing: Wound #1 Left,Anterior Lower Leg: Clean wound with Normal Saline. May Shower, gently pat wound dry prior to applying new dressing. Anesthetic: Wound #1 Left,Anterior Lower Leg: Topical Lidocaine 4% cream applied to wound bed prior to debridement Skin Barriers/Peri-Wound Care: Wound #1 Left,Anterior Lower Leg: Skin Prep Primary Wound Dressing: Wound #1 Left,Anterior Lower Leg: [name], Gabriel A. ([id number]) Other: - siltec sorbact Dressing Change Frequency: Wound #1 Left,Anterior Lower Leg: Other: - change when drainage reaches 1cm from edges of dressing Edema Control: Wound #1 Left,Anterior Lower Leg: Patient to wear own compression stockings Elevate legs to the level of the heart and pump ankles as often as possible Additional Orders / Instructions: Wound #1 Left,Anterior Lower Leg: Stop Smoking This gentleman has seen the vascular surgeon in the past and we will try and obtain his vascular workup reports.we have not yet received his vascular reports. He can continue to wear his lymphedema pumps and use them twice a day as prescribed by his vascular surgeon. He is already on 20-30 mm compression stockings of the thigh-high variety and we will encourage him to wear this. His wound  will be addressed with Siltec Sorbact to be changed twice a week. I will also recommended elevation of the limb as often as possible and to use a couple of pillows at night to help with elevation. They've spent some time discussing cessation of smoking and I discussed the various methods and the patient is agreeable to work on this as he already has all the supplies he needs. All questions answered and he will come back and see as an regular basis. Electronic Signature(s) Signed: 05/29/2015 2:27:49 PM By: Gabriel Fudge MD, FACS Previous Signature: 05/29/2015 2:26:58 PM Version By: Gabriel Fudge MD, FACS Entered By: [name] on 05/29/2015 14:27:48 [name], [name] ([id number]) -------------------------------------------------------------------------------- SuperBill Details Patient Name: [name], Gabriel A. Date of Service: 05/29/2015 Medical Record Number: [id number] Patient Account Number: [id number] Date of Birth/Sex: [birth year] (69 y.o. 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 I73.9 Peripheral vascular disease, unspecified Facility Procedures CPT4 Code: [id number] Description: [phone] - WOUND CARE VISIT-LEV 2 EST PT Modifier: Quantity: 1 Physician Procedures CPT4: Description Modifier Quantity Code [id number] - WC PHYS LEVEL 3 - EST PT 1 ICD-10 Description Diagnosis S81.812A Laceration without foreign body, left lower leg, initial encounter L97.222 Non-pressure chronic ulcer of left calf  with fat layer  exposed F17.218 Nicotine dependence, cigarettes, with other nicotine-induced disorders 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

## 2015-05-30 NOTE — Progress Notes (Signed)
Gabriel, [name] ([id number]) Visit Report for 05/29/2015 Arrival Information Details Patient Name: [name], Gabriel A. Date of Service: 05/29/2015 2:00 PM Medical Record Number: [id number] Patient Account Number: [id number] Date of Birth/Sex: 1946-07-17 (69 y.o. Male) Treating RN: Cornell Barman Primary Care Physician: Ramonita Lab Other Clinician: Referring Physician: Ramonita Lab Treating Physician/Extender: Frann Rider in Treatment: 1 Visit Information History Since Last Visit Added or deleted any medications: No Patient Arrived: [name] Any new allergies or adverse reactions: No Arrival Time: 13:58 Had a fall or experienced change in No Accompanied By: daughter, activities of daily living that may affect [place] risk of falls: Transfer Assistance: None Signs or symptoms of abuse/neglect since last No Patient Identification Verified: Yes visito Secondary Verification Process Yes Hospitalized since last visit: No Completed: Has Dressing in Place as Prescribed: Yes Has Compression in Place as Prescribed: Yes Pain Present Now: No Electronic Signature(s) Signed: 05/29/2015 4:42:36 PM By: Gretta Cool, RN, BSN, Kim RN, BSN Entered By: Gretta Cool, RN, BSN, Kim on 05/29/2015 14:04:36 [place], Rodena Piety ([id number]) -------------------------------------------------------------------------------- Clinic Level of Care Assessment Details Patient Name: [name], [name] Date of Service: 05/29/2015 2:00 PM Medical Record Number: [id number] Patient Account Number: [id number] Date of Birth/Sex: 1946/07/12 (69 y.o. Male) Treating RN: Cornell Barman Primary Care Physician: Ramonita Lab Other Clinician: Referring Physician: Ramonita Lab Treating Physician/Extender: Frann Rider in Treatment: 1 Clinic Level of Care Assessment Items TOOL 4 Quantity Score []  - Use when only an EandM is performed on FOLLOW-UP visit 0 ASSESSMENTS - Nursing Assessment / Reassessment []  - Reassessment of Co-morbidities (includes  updates in patient status) 0 X - Reassessment of Adherence to Treatment Plan 1 5 ASSESSMENTS - Wound and Skin Assessment / Reassessment X - Simple Wound Assessment / Reassessment - one wound 1 5 []  - Complex Wound Assessment / Reassessment - multiple wounds 0 []  - Dermatologic / Skin Assessment (not related to wound area) 0 ASSESSMENTS - Focused Assessment []  - Circumferential Edema Measurements - multi extremities 0 []  - Nutritional Assessment / Counseling / Intervention 0 []  - Lower Extremity Assessment (monofilament, tuning fork, pulses) 0 []  - Peripheral Arterial Disease Assessment (using hand held doppler) 0 ASSESSMENTS - Ostomy and/or Continence Assessment and Care []  - Incontinence Assessment and Management 0 []  - Ostomy Care Assessment and Management (repouching, etc.) 0 PROCESS - Coordination of Care X - Simple Patient / Family Education for ongoing care 1 15 []  - Complex (extensive) Patient / Family Education for ongoing care 0 []  - Staff obtains Programmer, systems, Records, Test Results / Process Orders 0 []  - Staff telephones HHA, Nursing Homes / Clarify orders / etc 0 []  - Routine Transfer to another Facility (non-emergent condition) 0 Poteat, Krishang A. ([id number]) []  - Routine Hospital Admission (non-emergent condition) 0 []  - New Admissions / Biomedical engineer / Ordering NPWT, Apligraf, etc. 0 []  - Emergency Hospital Admission (emergent condition) 0 X - Simple Discharge Coordination 1 10 []  - Complex (extensive) Discharge Coordination 0 PROCESS - Special Needs []  - Pediatric / Minor Patient Management 0 []  - Isolation Patient Management 0 []  - Hearing / Language / Visual special needs 0 []  - Assessment of Community assistance (transportation, D/C planning, etc.) 0 []  - Additional assistance / Altered mentation 0 []  - Support Surface(s) Assessment (bed, cushion, seat, etc.) 0 INTERVENTIONS - Wound Cleansing / Measurement X - Simple Wound Cleansing - one wound 1 5 []  -  Complex Wound Cleansing - multiple wounds 0 X - Wound Imaging (photographs - any number of wounds) 1  5 []  - Wound Tracing (instead of photographs) 0 X - Simple Wound Measurement - one wound 1 5 []  - Complex Wound Measurement - multiple wounds 0 INTERVENTIONS - Wound Dressings X - Small Wound Dressing one or multiple wounds 1 10 []  - Medium Wound Dressing one or multiple wounds 0 []  - Large Wound Dressing one or multiple wounds 0 []  - Application of Medications - topical 0 []  - Application of Medications - injection 0 INTERVENTIONS - Miscellaneous []  - External ear exam 0 Keplinger, Salah A. ([id number]) []  - Specimen Collection (cultures, biopsies, blood, body fluids, etc.) 0 []  - Specimen(s) / Culture(s) sent or taken to Lab for analysis 0 []  - Patient Transfer (multiple staff / Harrel Lemon Lift / Similar devices) 0 []  - Simple Staple / Suture removal (25 or less) 0 []  - Complex Staple / Suture removal (26 or more) 0 []  - Hypo / Hyperglycemic Management (close monitor of Blood Glucose) 0 []  - Ankle / Brachial Index (ABI) - do not check if billed separately 0 X - Vital Signs 1 5 Has the patient been seen at the hospital within the last three years: Yes Total Score: 65 Level Of Care: New/Established - Level 2 Electronic Signature(s) Signed: 05/29/2015 4:42:36 PM By: Gretta Cool, RN, BSN, Kim RN, BSN Entered By: Gretta Cool, RN, BSN, Kim on 05/29/2015 14:23:20 [place], Rodena Piety ([id number]) -------------------------------------------------------------------------------- Encounter Discharge Information Details Patient Name: [name], [name] Date of Service: 05/29/2015 2:00 PM Medical Record Number: [id number] Patient Account Number: [id number] Date of Birth/Sex: 08/18/1945 (69 y.o. Male) Treating RN: Cornell Barman Primary Care Physician: Ramonita Lab Other Clinician: Referring Physician: Ramonita Lab Treating Physician/Extender: Frann Rider in Treatment: 1 Encounter Discharge Information Items Discharge  Pain Level: 0 Discharge Condition: Stable Ambulatory Status: Ambulatory Discharge Destination: Home Transportation: Private Auto Accompanied By: daughter Schedule Follow-up Appointment: Yes Medication Reconciliation completed and provided to Patient/Care Yes Aramis Zobel: Provided on Clinical Summary of Care: 05/29/2015 Form Type Recipient Paper Patient DM Electronic Signature(s) Signed: 05/29/2015 4:42:36 PM By: Gretta Cool RN, BSN, Kim RN, BSN Previous Signature: 05/29/2015 2:22:57 PM Version By: Ruthine Dose Entered By: Gretta Cool RN, BSN, Kim on 05/29/2015 14:24:02 [place], Rodena Piety ([id number]) -------------------------------------------------------------------------------- Lower Extremity Assessment Details Patient Name: Upperman, Ramiro A. Date of Service: 05/29/2015 2:00 PM Medical Record Number: [id number] Patient Account Number: [id number] Date of Birth/Sex: 02-29-1946 (69 y.o. Male) Treating RN: Cornell Barman Primary Care Physician: Ramonita Lab Other Clinician: Referring Physician: Ramonita Lab Treating Physician/Extender: Frann Rider in Treatment: 1 Edema Assessment Assessed: [Left: No] [Right: No] E[Left: dema] [Right: :] Calf Left: Right: Point of Measurement: 37 cm From Medial Instep 39.4 cm cm Ankle Left: Right: Point of Measurement: 12 cm From Medial Instep 24.9 cm cm Vascular Assessment Pulses: Posterior Tibial Palpable: [Left:Yes] Dorsalis Pedis Palpable: [Left:Yes] Extremity colors, hair growth, and conditions: Extremity Color: [Left:Hyperpigmented] Hair Growth on Extremity: [Left:No] Temperature of Extremity: [Left:Warm] Capillary Refill: [Left:< 3 seconds] Toe Nail Assessment Left: Right: Thick: No Discolored: No Deformed: No Improper Length and Hygiene: No Electronic Signature(s) Signed: 05/29/2015 4:42:36 PM By: Gretta Cool, RN, BSN, Kim RN, BSN Entered By: Gretta Cool, RN, BSN, Kim on 05/29/2015 14:09:09 [place], Rodena Piety ([id number]) [place], Paoli AMarland Kitchen  ([id number]) -------------------------------------------------------------------------------- Multi Wound Chart Details Patient Name: Lokey, Wilford A. Date of Service: 05/29/2015 2:00 PM Medical Record Number: [id number] Patient Account Number: [id number] Date of Birth/Sex: 1946/07/13 (69 y.o. Male) Treating RN: Cornell Barman Primary Care Physician: Ramonita Lab Other Clinician: Referring Physician: Ramonita Lab Treating Physician/Extender: Frann Rider in Treatment:  1 Vital Signs Height(in): 74 Pulse(bpm): 64 Weight(lbs): 230 Blood Pressure 121/62 (mmHg): Body Mass Index(BMI): 30 Temperature(F): 98.0 Respiratory Rate 18 (breaths/min): Photos: [1:No Photos] [N/A:N/A] Wound Location: [1:Left Lower Leg - Anterior] [N/A:N/A] Wounding Event: [1:Trauma] [N/A:N/A] Primary Etiology: [1:Trauma, Other] [N/A:N/A] Comorbid History: [1:Asthma, Chronic Obstructive Pulmonary Disease (COPD), Coronary Artery Disease, Deep Vein Thrombosis, Hypertension, Myocardial Infarction, Osteoarthritis, Neuropathy, Received Chemotherapy] [N/A:N/A] Date Acquired: [1:04/21/2015] [N/A:N/A] Weeks of Treatment: [1:1] [N/A:N/A] Wound Status: [1:Open] [N/A:N/A] Measurements L x W x D 2x0.5x0.1 [N/A:N/A] (cm) Area (cm) : [1:0.785] [N/A:N/A] Volume (cm) : [1:0.079] [N/A:N/A] % Reduction in Area: [1:71.50%] [N/A:N/A] % Reduction in Volume: 85.70% [N/A:N/A] Classification: [1:Full Thickness Without Exposed Support Structures] [N/A:N/A] Exudate Amount: [1:Large] [N/A:N/A] Exudate Type: [1:Serous] [N/A:N/A] Exudate Color: [1:amber] [N/A:N/A] Wound Margin: [1:Flat and Intact] [N/A:N/A] Granulation Amount: [1:None Present (0%)] [N/A:N/A] Necrotic Amount: Large (67-100%) N/A N/A Exposed Structures: Fascia: No N/A N/A Fat: No Tendon: No Muscle: No Joint: No Bone: No Limited to Skin Breakdown Epithelialization: None N/A N/A Periwound Skin Texture: Edema: Yes N/A N/A Excoriation: No Induration:  No Callus: No Crepitus: No Fluctuance: No Friable: No Rash: No Scarring: No Periwound Skin Moist: Yes N/A N/A Moisture: Maceration: No Dry/Scaly: No Periwound Skin Color: Hemosiderin Staining: Yes N/A N/A Atrophie Blanche: No Cyanosis: No Ecchymosis: No Erythema: No Mottled: No Pallor: No Rubor: No Temperature: No Abnormality N/A N/A Tenderness on No N/A N/A Palpation: Wound Preparation: Ulcer Cleansing: N/A N/A Rinsed/Irrigated with Saline Topical Anesthetic Applied: Other: lidocaine 4% Treatment Notes Electronic Signature(s) Signed: 05/29/2015 4:42:36 PM By: Gretta Cool, RN, BSN, Kim RN, BSN Entered By: Gretta Cool, RN, BSN, Kim on 05/29/2015 14:13:24 [place], Rodena Piety ([id number]) -------------------------------------------------------------------------------- Multi-Disciplinary Care Plan Details Patient Name: [facility], Field A. Date of Service: 05/29/2015 2:00 PM Medical Record Number: [id number] Patient Account Number: [id number] Date of Birth/Sex: August 26, 1945 (69 y.o. Male) Treating RN: Cornell Barman Primary Care Physician: Ramonita Lab Other Clinician: Referring Physician: Ramonita Lab Treating Physician/Extender: Frann Rider in Treatment: 1 Active Inactive Abuse / Safety / Falls / Self Care Management Nursing Diagnoses: Impaired physical mobility Potential for falls Goals: Patient will remain injury free Date Initiated: 05/22/2015 Goal Status: Active Interventions: Assess fall risk on admission and as needed Notes: Orientation to the Wound Care Program Nursing Diagnoses: Knowledge deficit related to the wound healing center program Goals: Patient/caregiver will verbalize understanding of the [facility] Date Initiated: 05/22/2015 Goal Status: Active Interventions: Provide education on orientation to the wound center Notes: Venous Leg Ulcer Nursing Diagnoses: Potential for venous Insuffiency (use before diagnosis confirmed) Goals: Patient  will maintain optimal edema control DENNEY, SHEIN A. ([id number]) Date Initiated: 05/22/2015 Goal Status: Active Interventions: Assess peripheral edema status every visit. Notes: Wound/Skin Impairment Nursing Diagnoses: Impaired tissue integrity Goals: Ulcer/skin breakdown will have a volume reduction of 30% by week 4 Date Initiated: 05/22/2015 Goal Status: Active Ulcer/skin breakdown will have a volume reduction of 50% by week 8 Date Initiated: 05/22/2015 Goal Status: Active Ulcer/skin breakdown will have a volume reduction of 80% by week 12 Date Initiated: 05/22/2015 Goal Status: Active Ulcer/skin breakdown will heal within 14 weeks Date Initiated: 05/22/2015 Goal Status: Active Interventions: Assess ulceration(s) every visit Notes: Electronic Signature(s) Signed: 05/29/2015 4:42:36 PM By: Gretta Cool, RN, BSN, Kim RN, BSN Entered By: Gretta Cool, RN, BSN, Kim on 05/29/2015 14:13:17 [place], Rodena Piety ([id number]) -------------------------------------------------------------------------------- Pain Assessment Details Patient Name: Destin, Ramondo A. Date of Service: 05/29/2015 2:00 PM Medical Record Number: [id number] Patient Account Number: [id number] Date of Birth/Sex: [birth year] (68 y.o. Male) Treating  RN: Cornell Barman Primary Care Physician: Ramonita Lab Other Clinician: Referring Physician: Ramonita Lab Treating Physician/Extender: Frann Rider in Treatment: 1 Active Problems Location of Pain Severity and Description of Pain Patient Has Paino No Site Locations Pain Management and Medication Current Pain Management: Electronic Signature(s) Signed: 05/29/2015 4:42:36 PM By: Gretta Cool, RN, BSN, Kim RN, BSN Entered By: Gretta Cool, RN, BSN, Kim on 05/29/2015 14:04:43 [place], Rodena Piety ([id number]) -------------------------------------------------------------------------------- Patient/Caregiver Education Details Patient Name: Glendon Axe A. Date of Service: 05/29/2015 2:00 PM Medical Record  Number: [id number] Patient Account Number: [id number] Date of Birth/Gender: August 02, 1945 (69 y.o. Male) Treating RN: Cornell Barman Primary Care Physician: Ramonita Lab Other Clinician: Referring Physician: Ramonita Lab Treating Physician/Extender: Frann Rider in Treatment: 1 Education Assessment Education Provided To: Patient Education Topics Provided Wound/Skin Impairment: Handouts: Caring for Your Ulcer, Other: wound care as presceribed Methods: Demonstration, Explain/Verbal Responses: State content correctly Electronic Signature(s) Signed: 05/29/2015 4:42:36 PM By: Gretta Cool, RN, BSN, Kim RN, BSN Entered By: Gretta Cool, RN, BSN, Kim on 05/29/2015 14:24:18 [place], Rodena Piety ([id number]) -------------------------------------------------------------------------------- Wound Assessment Details Patient Name: Alfonzo, Shakir A. Date of Service: 05/29/2015 2:00 PM Medical Record Number: [id number] Patient Account Number: [id number] Date of Birth/Sex: [birth year] (69 y.o. Male) Treating RN: Cornell Barman Primary Care Physician: Ramonita Lab Other Clinician: Referring Physician: Ramonita Lab Treating Physician/Extender: Frann Rider in Treatment: 1 Wound Status Wound Number: 1 Primary Trauma, Other Etiology: Wound Location: Left Lower Leg - Anterior Wound Open Wounding Event: Trauma Status: Date Acquired: 04/21/2015 Comorbid Asthma, Chronic Obstructive Pulmonary Weeks Of Treatment: 1 History: Disease (COPD), Coronary Artery Clustered Wound: No Disease, Deep Vein Thrombosis, Hypertension, Myocardial Infarction, Osteoarthritis, Neuropathy, Received Chemotherapy Photos Photo Uploaded By: Gretta Cool, RN, BSN, Kim on 05/29/2015 14:37:14 Wound Measurements Length: (cm) 2 Width: (cm) 0.5 Depth: (cm) 0.1 Area: (cm) 0.785 Volume: (cm) 0.079 % Reduction in Area: 71.5% % Reduction in Volume: 85.7% Epithelialization: None Wound Description Full Thickness Without  Exposed Classification: Support Structures Wound Margin: Flat and Intact Exudate Large Amount: Exudate Type: Serous Exudate Color: amber [place], Ronelle A. ([id number]) Foul Odor After Cleansing: No Wound Bed Granulation Amount: None Present (0%) Exposed Structure Necrotic Amount: Large (67-100%) Fascia Exposed: No Necrotic Quality: Adherent Slough Fat Layer Exposed: No Tendon Exposed: No Muscle Exposed: No Joint Exposed: No Bone Exposed: No Limited to Skin Breakdown Periwound Skin Texture Texture Color No Abnormalities Noted: No No Abnormalities Noted: No Callus: No Atrophie Blanche: No Crepitus: No Cyanosis: No Excoriation: No Ecchymosis: No Fluctuance: No Erythema: No Friable: No Hemosiderin Staining: Yes Induration: No Mottled: No Localized Edema: Yes Pallor: No Rash: No Rubor: No Scarring: No Temperature / Pain Moisture Temperature: No Abnormality No Abnormalities Noted: No Dry / Scaly: No Maceration: No Moist: Yes Wound Preparation Ulcer Cleansing: Rinsed/Irrigated with Saline Topical Anesthetic Applied: Other: lidocaine 4%, Treatment Notes Wound #1 (Left, Anterior Lower Leg) 1. Cleansed with: Clean wound with Normal Saline 2. Anesthetic Topical Lidocaine 4% cream to wound bed prior to debridement 4. Dressing Applied: Other dressing (specify in notes) Notes siltec sorbact Electronic Signature(s) Signed: 05/29/2015 4:42:36 PM By: Gretta Cool, RN, BSN, Kim RN, BSN Entered By: Gretta Cool, RN, BSN, Kim on 05/29/2015 14:11:03 [place], Rodena Piety ([id number]) [place], MendesMarland Kitchen ([id number]) -------------------------------------------------------------------------------- Vitals Details Patient Name: Vereen, Nicodemus A. Date of Service: 05/29/2015 2:00 PM Medical Record Number: [id number] Patient Account Number: [id number] Date of Birth/Sex: 12-08-1945 (69 y.o. Male) Treating RN: Cornell Barman Primary Care Physician: Ramonita Lab Other Clinician: Referring Physician: Ramonita Lab Treating Physician/Extender: Frann Rider in  Treatment: 1 Vital Signs Time Taken: 14:02 Temperature (F): 98.0 Height (in): 74 Pulse (bpm): 64 Weight (lbs): 230 Respiratory Rate (breaths/min): 18 Body Mass Index (BMI): 29.5 Blood Pressure (mmHg): 121/62 Reference Range: 80 - 120 mg / dl Electronic Signature(s) Signed: 05/29/2015 4:42:36 PM By: Gretta Cool, RN, BSN, Kim RN, BSN Entered By: Gretta Cool, RN, BSN, Kim on 05/29/2015 14:07:10

## 2015-06-05 ENCOUNTER — Encounter: Payer: Medicare Other | Admitting: Surgery

## 2015-06-05 DIAGNOSIS — S81812A Laceration without foreign body, left lower leg, initial encounter: Secondary | ICD-10-CM | POA: Diagnosis not present

## 2015-06-06 NOTE — Progress Notes (Signed)
JJESUS, HOGGLE ([id number]) Visit Report for 06/05/2015 Chief Complaint Document Details Patient Name: [name], Gabriel A. Date of Service: 06/05/2015 2:45 PM Medical Record Number: [id number] Patient Account Number: [id number] Date of Birth/Sex: 1945/10/14 (69 y.o. Male) Treating RN: Cornell Barman Primary Care Physician: Ramonita Lab Other Clinician: Referring Physician: Ramonita Lab Treating Physician/Extender: Frann Rider in Treatment: 2 Information Obtained from: Patient Chief Complaint Patient presents to the wound care center for a consult due non healing wound. He had a lacerated wound to the left lower extremity on 04/25/2015 and this was sutured in the ER. He was placed on doxycycline orally and then when his sutures were removed the wound opened out for the last 10 days or so. Electronic Signature(s) Signed: 06/05/2015 3:28:04 PM By: Christin Fudge MD, FACS Entered By: Christin Fudge on 06/05/2015 15:28:04 Wilsey, [name] ([id number]) -------------------------------------------------------------------------------- HPI Details Patient Name: [name], Gabriel A. Date of Service: 06/05/2015 2:45 PM Medical Record Number: [id number] Patient Account Number: [id number] Date of Birth/Sex: [birth year] (69 y.o. Male) Treating RN: Cornell Barman Primary Care Physician: Ramonita Lab Other Clinician: Referring Physician: Ramonita Lab Treating Physician/Extender: Frann Rider in Treatment: 2 History of Present Illness Location: injury to the left lower extremity with wound open Quality: Patient reports experiencing a dull pain to affected area(s). Severity: Patient states wound are getting worse. Duration: Patient has had the wound for < 2 weeks prior to presenting for treatment Timing: Pain in wound is Intermittent (comes and goes Context: The wound occurred when the patient injured himself while working in the yard and then the sutured wound reopened once the sutures were  removed. Modifying Factors: Consults to this date include:was on doxycycline which is now over. Associated Signs and Symptoms: Patient reports having difficulty standing for long periods. HPI Description: He had a lacerated wound to the left lower extremity on 04/25/2015 and this was sutured in the ER. He was placed on doxycycline orally and then when his sutures were removed the wound opened out for the last 10 days or so. 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 recently he has had his anticoagulation stopped because of bleeding from the bladder tumor and has been put on thigh high compression stockings of the 20-30 mm mercury variety. He smokes over a pack of cigarettes a day. 05/29/2015 -- He has not yet given up smoking and continues to try to work with this. He had a question whether he can use his lymphedema pumps. 06/05/2015 -- we have not yet received his vascular reports but we will again try and get these from Dr. Bunnie Domino office. Addendum -- the patient was last seen by Dr. dew a year ago on 06/14/2014. His noninvasive studies had showed scattered atherosclerotic plaque without any hemodynamic significant stenosis in either lower extremity. His ABIs were noncompressible but his digital pressures and waveforms were good. 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 In view of the fact that he did not have significant arterial insufficiency he was told to wear compression stockings and elevate his leg as much as possible. Electronic  Signature(s) Signed: 06/05/2015 4:26:46 PM By: Christin Fudge MD, FACS Previous Signature: 06/05/2015 3:28:45 PM Version By: Christin Fudge MD, FACS Entered By: Con Memos  Torris House on 06/05/2015 16:26:46 [place], Jiovanny AMarland Kitchen ([id number]) -------------------------------------------------------------------------------- Physical Exam Details Patient Name: [name], Gabriel A. Date of Service: 06/05/2015 2:45 PM Medical Record Number: [id number] Patient Account Number: [id number] Date of Birth/Sex: 07/22/1946 (69 y.o. Male) Treating RN: Cornell Barman Primary Care Physician: Ramonita Lab Other Clinician: Referring Physician: Ramonita Lab Treating Physician/Extender: Frann Rider in Treatment: 2 Constitutional . Pulse regular. Respirations normal and unlabored. Afebrile. . Eyes Nonicteric. Reactive to light. Ears, Nose, Mouth, and Throat Lips, teeth, and gums WNL.Marland Kitchen Moist mucosa without lesions . Neck supple and nontender. No palpable supraclavicular or cervical adenopathy. Normal sized without goiter. Respiratory WNL. No retractions.. Cardiovascular Pedal Pulses WNL. No clubbing, cyanosis or edema. Chest Breasts symmetical and no nipple discharge.. Breast tissue WNL, no masses, lumps, or tenderness.. Lymphatic No adneopathy. No adenopathy. No adenopathy. Musculoskeletal Adexa without tenderness or enlargement.. Digits and nails w/o clubbing, cyanosis, infection, petechiae, ischemia, or inflammatory conditions.. Integumentary (Hair, Skin) No suspicious lesions. No crepitus or fluctuance. No peri-wound warmth or erythema. No masses.Marland Kitchen Psychiatric Judgement and insight Intact.. No evidence of depression, anxiety, or agitation.. Notes the wound continues to improve and there is mild amount of slough and exudate which is scant out with moist saline gauze. Electronic Signature(s) Signed: 06/05/2015 3:29:20 PM By: Christin Fudge MD, FACS Entered By: Christin Fudge on 06/05/2015 15:29:19 [place], [name]  ([id number]) -------------------------------------------------------------------------------- Physician Orders Details Patient Name: [name], Gabriel A. Date of Service: 06/05/2015 2:45 PM Medical Record Number: [id number] Patient Account Number: [id number] Date of Birth/Sex: 07-11-1946 (69 y.o. Male) Treating RN: Cornell Barman Primary Care Physician: Ramonita Lab Other Clinician: Referring Physician: Ramonita Lab Treating Physician/Extender: Frann Rider in Treatment: 2 Verbal / Phone Orders: Yes Clinician: Cornell Barman Read Back and Verified: Yes Diagnosis Coding Wound Cleansing Wound #1 Left,Anterior Lower Leg o Clean wound with Normal Saline. o May Shower, gently pat wound dry prior to applying new dressing. Anesthetic Wound #1 Left,Anterior Lower Leg o Topical Lidocaine 4% cream applied to wound bed prior to debridement Skin Barriers/Peri-Wound Care Wound #1 Left,Anterior Lower Leg o Skin Prep Primary Wound Dressing Wound #1 Left,Anterior Lower Leg o Other: - siltec sorbact Dressing Change Frequency Wound #1 Left,Anterior Lower Leg o Other: - change when drainage reaches 1cm from edges of dressing Edema Control Wound #1 Left,Anterior Lower Leg o Patient to wear own compression stockings o Elevate legs to the level of the heart and pump ankles as often as possible Additional Orders / Instructions Wound #1 Left,Anterior Lower Leg o Stop Smoking Electronic Signature(s) Signed: 06/05/2015 4:17:28 PM By: Christin Fudge MD, FACS Signed: 06/05/2015 4:50:02 PM By: Gretta Cool RN, BSN, Kim RN, BSN [place], [place] AMarland Kitchen ([id number]) Entered By: Gretta Cool, RN, BSN, Kim on 06/05/2015 15:21:41 Kulm, [name] ([id number]) -------------------------------------------------------------------------------- Problem List Details Patient Name: [name], Gabriel A. Date of Service: 06/05/2015 2:45 PM Medical Record Number: [id number] Patient Account Number: [id number] Date of Birth/Sex:  Apr 05, 1946 (69 y.o. 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 Date of Service: 06/05/2015 2:45 PM Medical Record Number: [id number] Patient Account Number: [id number] Date of Birth/Sex: August 19, 1945 (69 y.o. Male) Treating RN: Cornell Barman Primary Care Physician: Ramonita Lab Other Clinician: Referring Physician: Ramonita Lab Treating Physician/Extender: Frann Rider in Treatment: 2 Subjective Chief Complaint Information obtained from Patient Patient presents to the wound care center for a consult due non healing wound. He had a lacerated wound to the left lower extremity on 04/25/2015 and this was sutured in the ER. He was placed on doxycycline orally and then when his sutures were removed the wound opened out for the last 10 days or so. History of Present Illness (HPI) The following HPI elements were documented for the patient's wound: Location: injury to the left lower extremity with wound open Quality:  Patient reports experiencing a dull pain to affected area(s). Severity: Patient states wound are getting worse. Duration: Patient has had the wound for < 2 weeks prior to presenting for treatment Timing: Pain in wound is Intermittent (comes and goes Context: The wound occurred when the patient injured himself while working in the yard and then the sutured wound reopened once the sutures were removed. Modifying Factors: Consults to this date include:was on doxycycline which is now over. Associated Signs and Symptoms: Patient reports having difficulty standing for long periods. He had a lacerated wound to the left lower extremity on 04/25/2015 and this was sutured in the ER. He was placed on doxycycline orally and then when his sutures were removed the wound opened out for the last 10 days or so. 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 recently he has had his anticoagulation stopped because of bleeding from the bladder tumor and has been put on thigh high compression stockings of the 20-30 mm mercury variety. He smokes over a pack of cigarettes a day. 05/29/2015 -- He has not yet given up smoking and continues to try to work with this. He had a question whether he can use his lymphedema pumps. 06/05/2015 -- we have not yet received his vascular reports but we will again try and get these from Dr. Bunnie Domino office. Addendum -- the patient was last seen by Dr. dew a year ago on 06/14/2014. His noninvasive studies had showed scattered atherosclerotic plaque without any hemodynamic significant stenosis in either lower extremity. His ABIs were  noncompressible but his digital pressures and waveforms were good. 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 In view of the fact that he did not have significant arterial insufficiency he was [name], Gabriel A. ([id number]) told to wear compression stockings and elevate his leg as much as possible. Objective Constitutional Pulse regular. Respirations normal and unlabored. Afebrile. Vitals Time Taken: 2:55 PM, Height: 74 in, Weight: 230 lbs, BMI: 29.5, Temperature: 97.6 F, Pulse: 51 bpm, Respiratory Rate: 18 breaths/min, Blood Pressure: 107/61 mmHg. Eyes Nonicteric. Reactive to light. Ears, Nose, Mouth, and Throat Lips, teeth, and gums WNL.Marland Kitchen Moist mucosa without lesions . Neck supple and nontender. No palpable supraclavicular or cervical adenopathy. Normal sized without goiter. Respiratory WNL. No retractions.. Cardiovascular Pedal  Pulses WNL. No clubbing, cyanosis or edema. Chest Breasts symmetical and no nipple discharge.. Breast tissue WNL, no masses, lumps, or tenderness.. Lymphatic No adneopathy. No adenopathy. No adenopathy. Musculoskeletal Adexa without tenderness or enlargement.. Digits and nails w/o clubbing, cyanosis, infection, petechiae, ischemia, or inflammatory conditions.Marland Kitchen Psychiatric Judgement and insight Intact.. No evidence of depression, anxiety, or agitation.. General Notes: the wound continues to improve and there is mild amount of slough and exudate which is scant out with moist saline gauze. Integumentary (Hair, Skin) No suspicious lesions. No crepitus or fluctuance. No peri-wound warmth or erythema. No masses.Marland Kitchen [name], Gabriel A. ([id number]) Wound #1 status is Open. Original cause of wound was Trauma. The wound is located on the Left,Anterior Lower Leg. The wound measures 1.5cm length x 0.4cm width x 0.1cm depth; 0.471cm^2 area and 0.047cm^3 volume. The wound is  limited to skin breakdown. There is a large amount of serous drainage noted. The wound margin is flat and intact. There is large (67-100%) granulation within the wound bed. There is no necrotic tissue within the wound bed. The periwound skin appearance exhibited: Localized Edema, Hemosiderin Staining. The periwound skin appearance did not exhibit: Callus, Crepitus, Excoriation, Fluctuance, Friable, Induration, Rash, Scarring, Dry/Scaly, Maceration, Moist, Atrophie Blanche, Cyanosis, Ecchymosis, Mottled, Pallor, Rubor, Erythema. Periwound temperature was noted as No Abnormality. 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 May Shower, gently pat wound dry prior to applying new dressing. Anesthetic: Wound #1 Left,Anterior Lower Leg: Topical Lidocaine 4% cream applied to wound bed prior to debridement Skin Barriers/Peri-Wound Care: Wound #1 Left,Anterior Lower Leg: Skin Prep Primary Wound Dressing: Wound #1 Left,Anterior Lower Leg: Other: - siltec sorbact Dressing Change Frequency: Wound #1 Left,Anterior Lower Leg: Other: - change when drainage reaches 1cm from edges of dressing Edema Control: Wound #1 Left,Anterior Lower Leg: [name], Gabriel A. ([id number]) Patient to wear own compression stockings Elevate legs to the level of the heart and pump ankles as often as possible Additional Orders / Instructions: Wound #1 Left,Anterior Lower Leg: Stop Smoking We have not yet received his vascular reports. He is already on 20-30 mm compression  stockings of the thigh-high variety and we will encourage him to wear this. His wound will be addressed with Siltec Sorbact to be changed twice a week. Elevation and smoking cessation has already been discussed and we have reiterated this today. He will come back and see as next week. Electronic Signature(s) Signed: 06/05/2015 4:27:04 PM By: Christin Fudge MD, FACS Previous Signature: 06/05/2015 3:30:32 PM Version By: Christin Fudge MD, FACS Entered By: Christin Fudge on 06/05/2015 16:27:03 [name], [name] ([id number]) -------------------------------------------------------------------------------- SuperBill Details Patient Name: [name], Gabriel A. Date of Service: 06/05/2015 Medical Record Number: [id number] Patient Account Number: [id number] Date of Birth/Sex: 12/06/1945 (69 y.o. 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  of left lower extremity I73.9 Peripheral vascular disease, unspecified Facility Procedures CPT4 Code: [id number] Description: [id number] - WOUND CARE VISIT-LEV 2 EST PT Modifier: Quantity: 1 Physician Procedures CPT4: Description Modifier Quantity Code [id number] - WC PHYS LEVEL 3 - EST PT 1 ICD-10 Description Diagnosis S81.812A Laceration without foreign body, left lower leg, initial encounter L97.222 Non-pressure chronic ulcer of left calf with fat layer  exposed F17.218 Nicotine dependence, cigarettes, with other nicotine-induced disorders I82.592 Chronic embolism and thrombosis of other specified  deep vein of left lower extremity Electronic Signature(s) Signed: 06/05/2015 3:30:48 PM By: Christin Fudge MD, FACS Entered By: Christin Fudge on 06/05/2015 15:30:48

## 2015-06-06 NOTE — Progress Notes (Addendum)
BRIANE, ROBBERSON ([id number]) Visit Report for 06/05/2015 Arrival Information Details Patient Name: [name], Gabriel A. Date of Service: 06/05/2015 2:45 PM Medical Record Number: [id number] Patient Account Number: [id number] Date of Birth/Sex: [birth year] (69 y.o. Male) Treating RN: Cornell Barman Primary Care Physician: Ramonita Lab Other Clinician: Referring Physician: Ramonita Lab Treating Physician/Extender: Frann Rider in Treatment: 2 Visit Information History Since Last Visit Added or deleted any medications: No Patient Arrived: [name] Any new allergies or adverse reactions: No Arrival Time: 14:54 Had a fall or experienced change in No Accompanied By: wife activities of daily living that may affect Transfer Assistance: None risk of falls: Patient Identification Verified: Yes Hospitalized since last visit: No Secondary Verification Process Completed: Yes Has Dressing in Place as Prescribed: Yes Has Compression in Place as Prescribed: Yes Pain Present Now: No Electronic Signature(s) Signed: 06/05/2015 4:50:02 PM By: Gretta Cool, RN, BSN, Kim RN, BSN Entered By: Gretta Cool, RN, BSN, Kim on 06/05/2015 14:54:50 [name], [name] ([id number]) -------------------------------------------------------------------------------- Clinic Level of Care Assessment Details Patient Name: [name], Gabriel A. Date of Service: 06/05/2015 2:45 PM Medical Record Number: [id number] Patient Account Number: [id number] Date of Birth/Sex: [birth year] (69 y.o. Male) Treating RN: Cornell Barman Primary Care Physician: Ramonita Lab Other Clinician: Referring Physician: Ramonita Lab Treating Physician/Extender: Frann Rider in Treatment: 2 Clinic Level of Care Assessment Items TOOL 4 Quantity Score []  - Use when only an EandM is performed on FOLLOW-UP visit 0 ASSESSMENTS - Nursing Assessment / Reassessment []  - Reassessment of Co-morbidities (includes updates in patient status) 0 X - Reassessment of Adherence to  Treatment Plan 1 5 ASSESSMENTS - Wound and Skin Assessment / Reassessment X - Simple Wound Assessment / Reassessment - one wound 1 5 []  - Complex Wound Assessment / Reassessment - multiple wounds 0 []  - Dermatologic / Skin Assessment (not related to wound area) 0 ASSESSMENTS - Focused Assessment []  - Circumferential Edema Measurements - multi extremities 0 []  - Nutritional Assessment / Counseling / Intervention 0 []  - Lower Extremity Assessment (monofilament, tuning fork, pulses) 0 []  - Peripheral Arterial Disease Assessment (using hand held doppler) 0 ASSESSMENTS - Ostomy and/or Continence Assessment and Care []  - Incontinence Assessment and Management 0 []  - Ostomy Care Assessment and Management (repouching, etc.) 0 PROCESS - Coordination of Care X - Simple Patient / Family Education for ongoing care 1 15 []  - Complex (extensive) Patient / Family Education for ongoing care 0 X - Staff obtains Programmer, systems, Records, Test Results / Process Orders 1 10 []  - Staff telephones HHA, Nursing Homes / Clarify orders / etc 0 []  - Routine Transfer to another Facility (non-emergent condition) 0 [name], [name] ([id number]) []  - Routine Hospital Admission (non-emergent condition) 0 []  - New Admissions / Biomedical engineer / Ordering NPWT, Apligraf, etc. 0 []  - Emergency Hospital Admission (emergent condition) 0 X - Simple Discharge Coordination 1 10 []  - Complex (extensive) Discharge Coordination 0 PROCESS - Special Needs []  - Pediatric / Minor Patient Management 0 []  - Isolation Patient Management 0 []  - Hearing / Language / Visual special needs 0 []  - Assessment of Community assistance (transportation, D/C planning, etc.) 0 []  - Additional assistance / Altered mentation 0 []  - Support Surface(s) Assessment (bed, cushion, seat, etc.) 0 INTERVENTIONS - Wound Cleansing / Measurement X - Simple Wound Cleansing - one wound 1 5 []  - Complex Wound Cleansing - multiple wounds 0 X - Wound Imaging  (photographs - any number of wounds) 1 5 []  - Wound Tracing (instead of photographs) 0  X - Simple Wound Measurement - one wound 1 5 []  - Complex Wound Measurement - multiple wounds 0 INTERVENTIONS - Wound Dressings X - Small Wound Dressing one or multiple wounds 1 10 []  - Medium Wound Dressing one or multiple wounds 0 []  - Large Wound Dressing one or multiple wounds 0 []  - Application of Medications - topical 0 []  - Application of Medications - injection 0 INTERVENTIONS - Miscellaneous []  - External ear exam 0 [name], Gabriel A. ([id number]) []  - Specimen Collection (cultures, biopsies, blood, body fluids, etc.) 0 []  - Specimen(s) / Culture(s) sent or taken to Lab for analysis 0 []  - Patient Transfer (multiple staff / Civil Service fast streamer / Similar devices) 0 []  - Simple Staple / Suture removal (25 or less) 0 []  - Complex Staple / Suture removal (26 or more) 0 []  - Hypo / Hyperglycemic Management (close monitor of Blood Glucose) 0 []  - Ankle / Brachial Index (ABI) - do not check if billed separately 0 X - Vital Signs 1 5 Has the patient been seen at the hospital within the last three years: Yes Total Score: 75 Level Of Care: New/Established - Level 2 Electronic Signature(s) Signed: 06/05/2015 4:50:02 PM By: Gretta Cool, RN, BSN, Kim RN, BSN Entered By: Gretta Cool, RN, BSN, Kim on 06/05/2015 15:22:06 [name], [name] ([id number]) -------------------------------------------------------------------------------- Encounter Discharge Information Details Patient Name: [name], Gabriel A. Date of Service: 06/05/2015 2:45 PM Medical Record Number: [id number] Patient Account Number: [id number] Date of Birth/Sex: 12/10/1945 (69 y.o. Male) Treating RN: Cornell Barman Primary Care Physician: Ramonita Lab Other Clinician: Referring Physician: Ramonita Lab Treating Physician/Extender: Frann Rider in Treatment: 2 Encounter Discharge Information Items Discharge Pain Level: 0 Discharge Condition: Stable Ambulatory Status:  Walker Discharge Destination: Home Transportation: Private Auto Accompanied By: wife Schedule Follow-up Appointment: Yes Medication Reconciliation completed and provided to Patient/Care Yes Andrina Locken: Provided on Clinical Summary of Care: 06/05/2015 Form Type Recipient Paper Patient DM Electronic Signature(s) Signed: 06/05/2015 3:29:48 PM By: Ruthine Dose Entered By: Ruthine Dose on 06/05/2015 15:29:48 [name], [name] Kitchen ([id number]) -------------------------------------------------------------------------------- Lower Extremity Assessment Details Patient Name: [name], Gabriel A. Date of Service: 06/05/2015 2:45 PM Medical Record Number: [id number] Patient Account Number: [id number] Date of Birth/Sex: 08/23/1945 (69 y.o. Male) Treating RN: Cornell Barman Primary Care Physician: Ramonita Lab Other Clinician: Referring Physician: Ramonita Lab Treating Physician/Extender: Frann Rider in Treatment: 2 Edema Assessment Assessed: [Left: No] [Right: No] E[Left: dema] [Right: :] Calf Left: Right: Point of Measurement: 37 cm From Medial Instep 39 cm cm Ankle Left: Right: Point of Measurement: 12 cm From Medial Instep 25.4 cm cm Vascular Assessment Pulses: Posterior Tibial Dorsalis Pedis Palpable: [Left:Yes] Extremity colors, hair growth, and conditions: Extremity Color: [Left:Hyperpigmented] Hair Growth on Extremity: [Left:No] Temperature of Extremity: [Left:Warm] Capillary Refill: [Left:< 3 seconds] Toe Nail Assessment Left: Right: Thick: No Discolored: No Deformed: No Improper Length and Hygiene: No Electronic Signature(s) Signed: 06/05/2015 4:50:02 PM By: Gretta Cool, RN, BSN, Kim RN, BSN Entered By: Gretta Cool, RN, BSN, Kim on 06/05/2015 15:00:15 [place], [name] ([id number]) -------------------------------------------------------------------------------- Multi Wound Chart Details Patient Name: [name], Gabriel A. Date of Service: 06/05/2015 2:45 PM Medical Record Number:  [id number] Patient Account Number: [id number] Date of Birth/Sex: [birth year] (69 y.o. Male) Treating RN: Cornell Barman Primary Care Physician: Ramonita Lab Other Clinician: Referring Physician: Ramonita Lab Treating Physician/Extender: Frann Rider in Treatment: 2 Vital Signs Height(in): 74 Pulse(bpm): 51 Weight(lbs): 230 Blood Pressure 107/61 (mmHg): Body Mass Index(BMI): 30 Temperature(F): 97.6 Respiratory Rate 18 (breaths/min): Photos: [1:No Photos] [N/A:N/A] Wound Location: [1:Left  Lower Leg - Anterior] [N/A:N/A] Wounding Event: [1:Trauma] [N/A:N/A] Primary Etiology: [1:Trauma, Other] [N/A:N/A] Comorbid History: [1:Asthma, Chronic Obstructive Pulmonary Disease (COPD), Coronary Artery Disease, Deep Vein Thrombosis, Hypertension, Myocardial Infarction, Osteoarthritis, Neuropathy, Received Chemotherapy] [N/A:N/A] Date Acquired: [1:04/21/2015] [N/A:N/A] Weeks of Treatment: [1:2] [N/A:N/A] Wound Status: [1:Open] [N/A:N/A] Measurements L x W x D 1.5x0.4x0.1 [N/A:N/A] (cm) Area (cm) : [1:0.471] [N/A:N/A] Volume (cm) : [1:0.047] [N/A:N/A] % Reduction in Area: [1:82.90%] [N/A:N/A] % Reduction in Volume: 91.50% [N/A:N/A] Classification: [1:Full Thickness Without Exposed Support Structures] [N/A:N/A] Exudate Amount: [1:Large] [N/A:N/A] Exudate Type: [1:Serous] [N/A:N/A] Exudate Color: [1:amber] [N/A:N/A] Wound Margin: [1:Flat and Intact] [N/A:N/A] Granulation Amount: [1:Large (67-100%)] [N/A:N/A] Necrotic Amount: None Present (0%) N/A N/A Exposed Structures: Fascia: No N/A N/A Fat: No Tendon: No Muscle: No Joint: No Bone: No Limited to Skin Breakdown Epithelialization: None N/A N/A Periwound Skin Texture: Edema: Yes N/A N/A Excoriation: No Induration: No Callus: No Crepitus: No Fluctuance: No Friable: No Rash: No Scarring: No Periwound Skin Maceration: No N/A N/A Moisture: Moist: No Dry/Scaly: No Periwound Skin Color: Hemosiderin Staining: Yes N/A  N/A Atrophie Blanche: No Cyanosis: No Ecchymosis: No Erythema: No Mottled: No Pallor: No Rubor: No Temperature: No Abnormality N/A N/A Tenderness on No N/A N/A Palpation: Wound Preparation: Ulcer Cleansing: N/A N/A Rinsed/Irrigated with Saline Topical Anesthetic Applied: None, Other: lidocaine 4% Treatment Notes Electronic Signature(s) Signed: 06/05/2015 4:50:02 PM By: Gretta Cool, RN, BSN, Kim RN, BSN Entered By: Gretta Cool, RN, BSN, Kim on 06/05/2015 15:19:41 [place], [name] ([id number]) -------------------------------------------------------------------------------- Multi-Disciplinary Care Plan Details Patient Name: Gabriel Axe A. Date of Service: 06/05/2015 2:45 PM Medical Record Number: [id number] Patient Account Number: [id number] Date of Birth/Sex: [birth year] (69 y.o. Male) Treating RN: Cornell Barman Primary Care Physician: Ramonita Lab Other Clinician: Referring Physician: Ramonita Lab Treating Physician/Extender: Frann Rider in Treatment: 2 Active Inactive Electronic Signature(s) Signed: 06/30/2015 5:05:36 PM By: Gretta Cool, RN, BSN, Kim RN, BSN Previous Signature: 06/05/2015 4:50:02 PM Version By: Gretta Cool RN, BSN, Kim RN, BSN Entered By: Gretta Cool, RN, BSN, Kim on 06/29/2015 08:57:46 [name], [name] ([id number]) -------------------------------------------------------------------------------- Pain Assessment Details Patient Name: [name], Gabriel A. Date of Service: 06/05/2015 2:45 PM Medical Record Number: [id number] Patient Account Number: [id number] Date of Birth/Sex: 1946-06-08 (69 y.o. Male) Treating RN: Cornell Barman Primary Care Physician: Ramonita Lab Other Clinician: Referring Physician: Ramonita Lab Treating Physician/Extender: Frann Rider in Treatment: 2 Active Problems Location of Pain Severity and Description of Pain Patient Has Paino No Site Locations Pain Management and Medication Current Pain Management: Electronic Signature(s) Signed: 06/05/2015 4:50:02 PM  By: Gretta Cool, RN, BSN, Kim RN, BSN Entered By: Gretta Cool, RN, BSN, Kim on 06/05/2015 14:54:55 [name], [name] ([id number]) -------------------------------------------------------------------------------- Patient/Caregiver Education Details Patient Name: Gabriel Axe A. Date of Service: 06/05/2015 2:45 PM Medical Record Number: [id number] Patient Account Number: [id number] Date of Birth/Gender: 14-Feb-1946 (69 y.o. Male) Treating RN: Cornell Barman Primary Care Physician: Ramonita Lab Other Clinician: Referring Physician: Ramonita Lab Treating Physician/Extender: Frann Rider in Treatment: 2 Education Assessment Education Provided To: Patient Education Topics Provided Wound/Skin Impairment: Handouts: Other: continue wound care as presc ruibedMethods: Demonstration, Explain/Verbal Responses: State content correctly Electronic Signature(s) Signed: 06/05/2015 4:50:02 PM By: Gretta Cool, RN, BSN, Kim RN, BSN Entered By: Gretta Cool, RN, BSN, Kim on 06/05/2015 15:23:38 [name], [name] ([id number]) -------------------------------------------------------------------------------- Wound Assessment Details Patient Name: Rauber, Mavis A. Date of Service: 06/05/2015 2:45 PM Medical Record Number: [id number] Patient Account Number: [id number] Date of Birth/Sex: 1945-12-31 (69 y.o. Male) Treating RN: Cornell Barman Primary Care Physician: Ramonita Lab Other Clinician: Referring Physician:  Ramonita Lab Treating Physician/Extender: Frann Rider in Treatment: 2 Wound Status Wound Number: 1 Primary Trauma, Other Etiology: Wound Location: Left, Anterior Lower Leg Wound Healed - Epithelialized Wounding Event: Trauma Status: Date Acquired: 04/21/2015 Comorbid Asthma, Chronic Obstructive Pulmonary Weeks Of Treatment: 2 History: Disease (COPD), Coronary Artery Clustered Wound: No Disease, Deep Vein Thrombosis, Hypertension, Myocardial Infarction, Osteoarthritis, Neuropathy, Received Chemotherapy Photos Photo  Uploaded By: Gretta Cool, RN, BSN, Kim on 06/05/2015 16:33:03 Wound Measurements Length: (cm) 0 Width: (cm) 0 Depth: (cm) 0 Area: (cm) 0.471 Volume: (cm) 0.047 % Reduction in Area: 82.9% % Reduction in Volume: 91.5% Epithelialization: None Wound Description Full Thickness Without Exposed Classification: Support Structures Wound Margin: Flat and Intact Exudate Large Amount: Exudate Type: Serous Exudate Color: [place], Codi A. ([id number]) Foul Odor After Cleansing: No Wound Bed Granulation Amount: Large (67-100%) Exposed Structure Necrotic Amount: None Present (0%) Fascia Exposed: No Fat Layer Exposed: No Tendon Exposed: No Muscle Exposed: No Joint Exposed: No Bone Exposed: No Limited to Skin Breakdown Periwound Skin Texture Texture Color No Abnormalities Noted: No No Abnormalities Noted: No Callus: No Atrophie Blanche: No Crepitus: No Cyanosis: No Excoriation: No Ecchymosis: No Fluctuance: No Erythema: No Friable: No Hemosiderin Staining: Yes Induration: No Mottled: No Localized Edema: Yes Pallor: No Rash: No Rubor: No Scarring: No Temperature / Pain Moisture Temperature: No Abnormality No Abnormalities Noted: No Dry / Scaly: No Maceration: No Moist: No Wound Preparation Ulcer Cleansing: Rinsed/Irrigated with Saline Topical Anesthetic Applied: None, Other: lidocaine 4%, Treatment Notes Wound #1 (Left, Anterior Lower Leg) 1. Cleansed with: Clean wound with Normal Saline 2. Anesthetic Topical Lidocaine 4% cream to wound bed prior to debridement 4. Dressing Applied: Other dressing (specify in notes) 7. Secured with Patient to wear own compression stockings Notes siltec sorbact Electronic Signature(s) ZIYA, VEREB ([id number]) Signed: 06/30/2015 5:05:36 PM By: Gretta Cool RN, BSN, Kim RN, BSN Previous Signature: 06/05/2015 4:50:02 PM Version By: Gretta Cool RN, BSN, Kim RN, BSN Entered By: Gretta Cool, RN, BSN, Kim on 06/29/2015 08:57:25 [place], [name]  ([id number]) -------------------------------------------------------------------------------- Vitals Details Patient Name: Novakovich, Kingstyn A. Date of Service: 06/05/2015 2:45 PM Medical Record Number: [id number] Patient Account Number: [id number] Date of Birth/Sex: [birth year] (69 y.o. Male) Treating RN: Cornell Barman Primary Care Physician: Ramonita Lab Other Clinician: Referring Physician: Ramonita Lab Treating Physician/Extender: Frann Rider in Treatment: 2 Vital Signs Time Taken: 14:55 Temperature (F): 97.6 Height (in): 74 Pulse (bpm): 51 Weight (lbs): 230 Respiratory Rate (breaths/min): 18 Body Mass Index (BMI): 29.5 Blood Pressure (mmHg): 107/61 Reference Range: 80 - 120 mg / dl Electronic Signature(s) Signed: 06/05/2015 4:50:02 PM By: Gretta Cool, RN, BSN, Kim RN, BSN Entered By: Gretta Cool, RN, BSN, Kim on 06/05/2015 14:58:12

## 2015-06-13 ENCOUNTER — Ambulatory Visit: Payer: Medicare Other | Admitting: Surgery

## 2015-06-29 DIAGNOSIS — Z79891 Long term (current) use of opiate analgesic: Secondary | ICD-10-CM | POA: Insufficient documentation

## 2015-06-29 DIAGNOSIS — Z5181 Encounter for therapeutic drug level monitoring: Secondary | ICD-10-CM | POA: Insufficient documentation

## 2015-07-05 DIAGNOSIS — E118 Type 2 diabetes mellitus with unspecified complications: Secondary | ICD-10-CM | POA: Insufficient documentation

## 2015-08-14 ENCOUNTER — Other Ambulatory Visit: Payer: Self-pay | Admitting: Nurse Practitioner

## 2015-08-14 DIAGNOSIS — K219 Gastro-esophageal reflux disease without esophagitis: Secondary | ICD-10-CM

## 2015-08-14 DIAGNOSIS — R110 Nausea: Secondary | ICD-10-CM

## 2015-08-18 ENCOUNTER — Ambulatory Visit: Payer: Medicare Other

## 2015-08-18 ENCOUNTER — Other Ambulatory Visit: Payer: Medicare Other

## 2015-08-25 ENCOUNTER — Other Ambulatory Visit: Payer: Medicare Other

## 2015-08-25 ENCOUNTER — Ambulatory Visit: Payer: Medicare Other

## 2015-09-13 ENCOUNTER — Ambulatory Visit
Admission: RE | Admit: 2015-09-13 | Discharge: 2015-09-13 | Disposition: A | Discharge status: Home / Self Care | Payer: Medicare Other | Source: Ambulatory Visit | Attending: Nurse Practitioner | Admitting: Nurse Practitioner

## 2015-09-13 DIAGNOSIS — K449 Diaphragmatic hernia without obstruction or gangrene: Secondary | ICD-10-CM | POA: Diagnosis not present

## 2015-09-13 DIAGNOSIS — R110 Nausea: Secondary | ICD-10-CM

## 2015-09-13 DIAGNOSIS — K219 Gastro-esophageal reflux disease without esophagitis: Secondary | ICD-10-CM | POA: Diagnosis present

## 2015-10-03 DIAGNOSIS — C674 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: Secondary | ICD-10-CM | POA: Insufficient documentation

## 2015-10-03 HISTORY — DX: 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: C67.4

## 2015-11-22 DIAGNOSIS — Z86718 Personal history of other venous thrombosis and embolism: Secondary | ICD-10-CM | POA: Insufficient documentation

## 2015-11-22 DIAGNOSIS — I480 Paroxysmal atrial fibrillation: Secondary | ICD-10-CM | POA: Insufficient documentation

## 2017-01-06 ENCOUNTER — Other Ambulatory Visit: Payer: Self-pay | Admitting: Neurology

## 2017-01-06 DIAGNOSIS — R1310 Dysphagia, unspecified: Secondary | ICD-10-CM

## 2017-01-24 ENCOUNTER — Ambulatory Visit (INDEPENDENT_AMBULATORY_CARE_PROVIDER_SITE_OTHER): Payer: Medicare Other | Admitting: Vascular Surgery

## 2017-01-24 ENCOUNTER — Encounter (INDEPENDENT_AMBULATORY_CARE_PROVIDER_SITE_OTHER): Payer: Self-pay | Admitting: Vascular Surgery

## 2017-01-24 ENCOUNTER — Ambulatory Visit: Payer: Medicare Other

## 2017-01-24 VITALS — BP 140/81 | HR 80 | Resp 16 | Wt 196.0 lb

## 2017-01-24 DIAGNOSIS — M79604 Pain in right leg: Secondary | ICD-10-CM

## 2017-01-24 DIAGNOSIS — I872 Venous insufficiency (chronic) (peripheral): Secondary | ICD-10-CM

## 2017-01-24 DIAGNOSIS — M7989 Other specified soft tissue disorders: Secondary | ICD-10-CM | POA: Diagnosis not present

## 2017-01-24 DIAGNOSIS — G8929 Other chronic pain: Secondary | ICD-10-CM | POA: Insufficient documentation

## 2017-01-24 DIAGNOSIS — M79609 Pain in unspecified limb: Secondary | ICD-10-CM | POA: Insufficient documentation

## 2017-01-24 DIAGNOSIS — I10 Essential (primary) hypertension: Secondary | ICD-10-CM | POA: Diagnosis not present

## 2017-01-24 DIAGNOSIS — M79605 Pain in left leg: Secondary | ICD-10-CM | POA: Diagnosis not present

## 2017-01-24 DIAGNOSIS — E118 Type 2 diabetes mellitus with unspecified complications: Secondary | ICD-10-CM

## 2017-01-24 DIAGNOSIS — E119 Type 2 diabetes mellitus without complications: Secondary | ICD-10-CM | POA: Insufficient documentation

## 2017-01-24 NOTE — Assessment & Plan Note (Signed)
The patient has lower extremity pain which is debilitating. This is likely multifactorial and I suspect there is a significant neuropathic component given his previous issues including back pain. He has multiple atherosclerotic risk factors and his arterial perfusion has not been checked in several years. I think this is reasonable to recheck. Obviously, given his history of venous disease this is a likely contributor in factors well. We'll check noninvasive studies and see the patient back at his convenience.

## 2017-01-24 NOTE — Assessment & Plan Note (Signed)
blood glucose control important in reducing the progression of atherosclerotic disease. Also, involved in wound healing. On appropriate medications.  

## 2017-01-24 NOTE — Assessment & Plan Note (Signed)
Prominent. 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 Check venous duplex.

## 2017-01-24 NOTE — Assessment & Plan Note (Signed)
blood pressure control important in reducing the progression of atherosclerotic disease. On appropriate oral medications.  

## 2017-01-24 NOTE — Progress Notes (Signed)
Patient ID: [name], male   DOB: 03-29-1946, 71 y.o.   MRN: [id number]  Chief Complaint  Patient presents with  . Leg Pain    HPI [name] is a 71 y.o. male.   The patient reports worsening pain and swelling in his legs associated with progressive discoloration.  He has not been seen in about 3 years. His legs have now gotten to the point where they wake him up and keep him from sleeping at night. They're associated with numbness and tingling in both legs. His left leg is more severely affected of the 2 legs. He has had previous DVT and IVC filter placement. He has marked stasis dermatitis worse in the left leg than the right. He is now having difficulties with even short distances of walking and says that his legs give out on him and go numb. He reports no fever or chills. No ulceration or infection.   Past Medical History:  Diagnosis Date  . Anxiety   . Arthritis    INFLAMMATORY POLYARTHROPATHY  . Asthma   . CHF (congestive heart failure) ([place])   . COPD (chronic obstructive pulmonary disease) ([place])   . Coronary artery disease    2000  . Depression   . Diabetes mellitus without complication ([place])   . DVT of lower extremity, bilateral ([place])   . Dysrhythmia    afib  . Hypertension   . Neuropathy   . Pain    chronic syndrome  . Peripheral vascular disease ([place])   . S/P IVC filter    removed  . Sleep apnea    o2  2.5 l hs  . Sleep disorder   . Stasis dermatitis of both legs     Past Surgical History:  Procedure Laterality Date  . BACK SURGERY     lumbar fusion  . BLADDER TUMOR EXCISION    . CERVICAL FUSION    . COLON SURGERY     bowel blockage  . CORONARY ANGIOPLASTY     2000  . INFUSION PUMP REPLACEMENT    . INTRATHECAL PUMP IMPLANTATION    . MASTOIDECTOMY     x 3  . NISSEN FUNDOPLICATION    . 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   Procedure: TRANSURETHRAL RESECTION OF BLADDER TUMOR (TURBT);  Surgeon: Royston Cowper, MD;   Location: [facility] ORS;  Service: Urology;  Laterality: N/A;    Family History  Problem Relation Age of Onset  . Heart disease Mother   . Emphysema Father   . Heart attack Father   . Emphysema Sister   . Heart disease Brother     Social History Social History  Substance Use Topics  . Smoking status: Current Every Day Smoker  . Smokeless tobacco: Never Used  . Alcohol use No  Married  Allergies  Allergen Reactions  . Codeine     unknown  . Levofloxacin Hives  . Morphine And Related Nausea Only  . Oxycontin [Oxycodone Hcl] Nausea Only  . Penicillins Other (See Comments)    unknown    Current Outpatient Prescriptions  Medication Sig Dispense Refill  . amitriptyline (ELAVIL) 100 MG tablet Take 100 mg by mouth at bedtime.    Marland Kitchen atenolol (TENORMIN) 25 MG tablet Take by mouth daily.    . Bupivacaine HCl (BUPIVACAINE ON-Q PAIN PUMP, FOR ORDER SET NO CHG,) by Other route once. Intrathecal pain pump    . diazepam (VALIUM) 5 MG tablet Take 5 mg by mouth every  8 (eight) hours as needed for anxiety.    . fexofenadine (ALLEGRA) 180 MG tablet Take 180 mg by mouth daily.    Marland Kitchen gabapentin (NEURONTIN) 600 MG tablet Take 600 mg by mouth 4 (four) times daily.    . hydrOXYzine (ATARAX/VISTARIL) 10 MG tablet Take 10 mg by mouth 3 (three) times daily as needed.    Marland Kitchen levothyroxine (SYNTHROID, LEVOTHROID) 75 MCG tablet Take 75 mcg by mouth daily before breakfast.    . magnesium oxide (MAG-OX) 400 MG tablet Take 400 mg by mouth daily.    . Methen-Hyosc-Meth Blue-Na Phos (UROGESIC-BLUE) 81.6 MG TABS Take 1 tablet (81.6 mg total) by mouth every 6 (six) hours as needed (dysuria). 40 tablet 3  . Multiple Vitamin (MULTIVITAMIN) capsule Take 1 capsule by mouth daily.    . Omega-3 Fatty Acids (FISH OIL) 1200 MG CAPS Take 1 capsule by mouth daily.    Marland Kitchen oxymorphone (OPANA) 10 MG tablet Take by mouth.    . promethazine (PHENERGAN) 12.5 MG tablet Take 12.5 mg by mouth every 6 (six) hours as needed for nausea or  vomiting.    . simvastatin (ZOCOR) 40 MG tablet Take 40 mg by mouth daily.    Marland Kitchen tiZANidine (ZANAFLEX) 4 MG capsule Take 4 mg by mouth at bedtime as needed for muscle spasms. 1 to 2 tab    . warfarin (COUMADIN) 6 MG tablet Take 6.5 mg by mouth daily.    . bisacodyl (DULCOLAX) 5 MG EC tablet Take 5 mg by mouth daily as needed for moderate constipation.    . docusate sodium (COLACE) 100 MG capsule Take 2 capsules (200 mg total) by mouth 2 (two) times daily. (Patient not taking: Reported on 01/24/2017) 120 capsule 3   No current facility-administered medications for this visit.       REVIEW OF SYSTEMS (Negative unless checked)  Constitutional: [] Weight loss  [] Fever  [] Chills Cardiac: [] Chest pain   [] Chest pressure   [] Palpitations   [] Shortness of breath when laying flat   [] Shortness of breath at rest   [] Shortness of breath with exertion. Vascular:  [x] Pain in legs with walking   [x] Pain in legs at rest   [] Pain in legs when laying flat   [] Claudication   [] Pain in feet when walking  [] Pain in feet at rest  [] Pain in feet when laying flat   [] History of DVT   [] Phlebitis   [x] Swelling in legs   [x] Varicose veins   [] Non-healing ulcers Pulmonary:   [] Uses home oxygen   [] Productive cough   [] Hemoptysis   [] Wheeze  [x] COPD   [] Asthma Neurologic:  [] Dizziness  [] Blackouts   [] Seizures   [] History of stroke   [] History of TIA  [] Aphasia   [] Temporary blindness   [] Dysphagia   [] Weakness or numbness in arms   [] Weakness or numbness in legs Musculoskeletal:  [x] Arthritis   [] Joint swelling   [] Joint pain   [x] Low back pain Hematologic:  [] Easy bruising  [] Easy bleeding   [] Hypercoagulable state   [] Anemic  [] Hepatitis Gastrointestinal:  [] Blood in stool   [] Vomiting blood  [] Gastroesophageal reflux/heartburn   [] Abdominal pain Genitourinary:  [] Chronic kidney disease   [] Difficult urination  [] Frequent urination  [] Burning with urination   [] Hematuria Skin:  [x] Rashes   [] Ulcers    [] Wounds Psychological:  [] History of anxiety   []  History of major depression.    Physical Exam BP 140/81   Pulse 80   Resp 16   Wt 196 lb (88.9 kg)   BMI  25.16 kg/m  Gen:  WD/WN, NAD. Appears older than stated age Head: [place]/AT, No temporalis wasting.  Ear/Nose/Throat: Hearing grossly intact, nares w/o erythema or drainage, oropharynx w/o Erythema/Exudate Eyes: Conjunctiva clear, sclera non-icteric  Neck: trachea midline.  No JVD.  Pulmonary:  Good air movement, respirations not labored, no use of accessory muscles.  Cardiac: Irregular Vascular:  Vessel Right Left  Radial Palpable Palpable                      Popliteal Not Palpable Not Palpable  PT Not Palpable Not Palpable  DP 1+ Palpable 1+ Palpable   Gastrointestinal: soft, non-tender/non-distended.  Musculoskeletal: M/S 5/5 throughout.  No deformity or atrophy. 1+ right lower extremity edema, 2+ left lower extremity edema. Marked stasis dermatitis changes are present bilaterally worse on the left and the right. Neurologic: Sensation grossly intact in extremities.  Symmetrical.  Speech is difficult to discern. Motor exam as listed above. Psychiatric: Judgment intact, Mood & affect appropriate for pt's clinical situation. Dermatologic: No rashes or ulcers noted.  No cellulitis or open wounds.    Radiology No results found.  Labs No results found for this or any previous visit (from the past 2160 hour(s)).  Assessment/Plan:  Essential hypertension, benign blood pressure control important in reducing the progression of atherosclerotic disease. On appropriate oral medications.   Diabetes ([place]) blood glucose control important in reducing the progression of atherosclerotic disease. Also, involved in wound healing. On appropriate medications.   Stasis dermatitis of both legs Prominent. 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 Check venous duplex.  Swelling of  limb Prominent. 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 Check venous duplex.  Pain in limb The patient has lower extremity pain which is debilitating. This is likely multifactorial and I suspect there is a significant neuropathic component given his previous issues including back pain. He has multiple atherosclerotic risk factors and his arterial perfusion has not been checked in several years. I think this is reasonable to recheck. Obviously, given his history of venous disease this is a likely contributor in factors well. We'll check noninvasive studies and see the patient back at his convenience.      Leotis Pain 01/24/2017, 4:35 PM   This note was created with Dragon medical transcription system.  Any errors from dictation are unintentional.

## 2017-04-01 ENCOUNTER — Ambulatory Visit (INDEPENDENT_AMBULATORY_CARE_PROVIDER_SITE_OTHER): Payer: Medicare Other | Admitting: Vascular Surgery

## 2017-04-01 ENCOUNTER — Encounter (INDEPENDENT_AMBULATORY_CARE_PROVIDER_SITE_OTHER): Payer: Medicare Other

## 2017-05-13 ENCOUNTER — Ambulatory Visit (INDEPENDENT_AMBULATORY_CARE_PROVIDER_SITE_OTHER): Payer: Medicare Other | Admitting: Vascular Surgery

## 2017-05-13 ENCOUNTER — Ambulatory Visit (INDEPENDENT_AMBULATORY_CARE_PROVIDER_SITE_OTHER): Payer: Medicare Other

## 2017-05-13 ENCOUNTER — Encounter (INDEPENDENT_AMBULATORY_CARE_PROVIDER_SITE_OTHER): Payer: Self-pay

## 2017-05-13 ENCOUNTER — Encounter (INDEPENDENT_AMBULATORY_CARE_PROVIDER_SITE_OTHER): Payer: Self-pay | Admitting: Vascular Surgery

## 2017-05-13 VITALS — BP 140/80 | HR 69 | Resp 16 | Ht 74.0 in | Wt 213.0 lb

## 2017-05-13 DIAGNOSIS — I872 Venous insufficiency (chronic) (peripheral): Secondary | ICD-10-CM

## 2017-05-13 DIAGNOSIS — I10 Essential (primary) hypertension: Secondary | ICD-10-CM | POA: Diagnosis not present

## 2017-05-13 DIAGNOSIS — M7989 Other specified soft tissue disorders: Secondary | ICD-10-CM | POA: Diagnosis not present

## 2017-05-13 DIAGNOSIS — E118 Type 2 diabetes mellitus with unspecified complications: Secondary | ICD-10-CM

## 2017-05-13 DIAGNOSIS — M79605 Pain in left leg: Secondary | ICD-10-CM

## 2017-05-13 DIAGNOSIS — M79604 Pain in right leg: Secondary | ICD-10-CM | POA: Diagnosis not present

## 2017-05-13 NOTE — Progress Notes (Signed)
MRN : [id number]  [name] is a 71 y.o. (1946/02/09) male who presents with chief complaint of  Chief Complaint  Patient presents with  . Follow-up    pt. conv. ABI. BLE. Vein reflux  .  History of Present Illness: Patient returns today in follow up of leg pain.  His legs remain very bothersome to him.  He still has a lot of swelling and discoloration he has had no significant improvement since his last visit.  He wears compression stockings on a daily basis. His noninvasive studies today demonstrate no evidence of deep venous thrombosis or superficial thrombophlebitis in either lower triphasic arterial waveforms are seen in the tibial vessels and a formal arterial study is not done given this finding he has had maintained arterial perfusion on all of his previous studies as well.  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  He has reflux in the common femoral vein and the great saphenous vein in the right lower extremity.  His left lower extremity is the more severely affected of the 2 legs.       Past Medical History:  Diagnosis Date  . Anxiety   . Arthritis    INFLAMMATORY POLYARTHROPATHY  . Asthma   . CHF (congestive heart failure) ([place])   . COPD (chronic obstructive pulmonary disease) ([place])   . Coronary artery disease    2000  . Depression   . Diabetes mellitus without complication ([place])   . DVT of lower extremity, bilateral ([place])   . Dysrhythmia    afib  . Hypertension   . Neuropathy   . Pain    chronic syndrome  . Peripheral vascular disease ([place])   . S/P IVC filter    removed  . Sleep apnea    o2  2.5 l hs  . Sleep disorder   . Stasis dermatitis of both legs          Past Surgical History:  Procedure Laterality Date  . BACK SURGERY     lumbar fusion  . BLADDER TUMOR EXCISION    . CERVICAL FUSION    . COLON SURGERY     bowel blockage  . CORONARY  ANGIOPLASTY     2000  . INFUSION PUMP REPLACEMENT    . INTRATHECAL PUMP IMPLANTATION    . MASTOIDECTOMY     x 3  . NISSEN FUNDOPLICATION    . 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   Procedure: TRANSURETHRAL RESECTION OF BLADDER TUMOR (TURBT);  Surgeon: Royston Cowper, MD;  Location: [facility] ORS;  Service: Urology;  Laterality: N/A;         Family History  Problem Relation Age of Onset  . Heart disease Mother   . Emphysema Father   . Heart attack Father   . Emphysema Sister   . Heart disease Brother     Social History     Social History  Substance Use Topics  . Smoking status: Current Every Day Smoker  . Smokeless tobacco: Never Used  . Alcohol use No  Married       Allergies  Allergen Reactions  . Codeine     unknown  . Levofloxacin Hives  . Morphine And Related Nausea Only  . Oxycontin [Oxycodone Hcl] Nausea Only  . Penicillins Other (See Comments)    unknown          Current Outpatient Prescriptions  Medication Sig Dispense Refill  .  amitriptyline (ELAVIL) 100 MG tablet Take 100 mg by mouth at bedtime.    Marland Kitchen atenolol (TENORMIN) 25 MG tablet Take by mouth daily.    . Bupivacaine HCl (BUPIVACAINE ON-Q PAIN PUMP, FOR ORDER SET NO CHG,) by Other route once. Intrathecal pain pump    . diazepam (VALIUM) 5 MG tablet Take 5 mg by mouth every 8 (eight) hours as needed for anxiety.    . fexofenadine (ALLEGRA) 180 MG tablet Take 180 mg by mouth daily.    Marland Kitchen gabapentin (NEURONTIN) 600 MG tablet Take 600 mg by mouth 4 (four) times daily.    . hydrOXYzine (ATARAX/VISTARIL) 10 MG tablet Take 10 mg by mouth 3 (three) times daily as needed.    Marland Kitchen levothyroxine (SYNTHROID, LEVOTHROID) 75 MCG tablet Take 75 mcg by mouth daily before breakfast.    . magnesium oxide (MAG-OX) 400 MG tablet Take 400 mg by mouth daily.    . Methen-Hyosc-Meth Blue-Na Phos (UROGESIC-BLUE) 81.6 MG TABS Take 1 tablet (81.6 mg total) by mouth  every 6 (six) hours as needed (dysuria). 40 tablet 3  . Multiple Vitamin (MULTIVITAMIN) capsule Take 1 capsule by mouth daily.    . Omega-3 Fatty Acids (FISH OIL) 1200 MG CAPS Take 1 capsule by mouth daily.    Marland Kitchen oxymorphone (OPANA) 10 MG tablet Take by mouth.    . promethazine (PHENERGAN) 12.5 MG tablet Take 12.5 mg by mouth every 6 (six) hours as needed for nausea or vomiting.    . simvastatin (ZOCOR) 40 MG tablet Take 40 mg by mouth daily.    Marland Kitchen tiZANidine (ZANAFLEX) 4 MG capsule Take 4 mg by mouth at bedtime as needed for muscle spasms. 1 to 2 tab    . warfarin (COUMADIN) 6 MG tablet Take 6.5 mg by mouth daily.    . bisacodyl (DULCOLAX) 5 MG EC tablet Take 5 mg by mouth daily as needed for moderate constipation.    . docusate sodium (COLACE) 100 MG capsule Take 2 capsules (200 mg total) by mouth 2 (two) times daily. (Patient not taking: Reported on 01/24/2017) 120 capsule 3     Physical Examination  BP 140/80 (BP Location: Right Arm)   Pulse 69   Resp 16   Ht 6\' 2"  (1.88 m)   Wt 96.6 kg (213 lb)   BMI 27.35 kg/m  Gen:  WD/WN, NAD Head: [place]/AT, No temporalis wasting. Ear/Nose/Throat: Hearing grossly intact, nares w/o erythema or drainage, trachea midline Eyes: Conjunctiva clear. Sclera non-icteric Neck: Supple.  No JVD.  Pulmonary:  Good air movement, no use of accessory muscles.  Cardiac: irregular Vascular:  Vessel Right Left  Radial Palpable Palpable                      Popliteal Not Palpable Not Palpable  PT Not Palpable Not Palpable  DP 1+ Palpable 1+ Palpable    Musculoskeletal: M/S 5/5 throughout.  No deformity or atrophy. 1-2+ RLE edema, 2+ LLE edema.  Marked stasis changes are present throughout both lower extremities worse on the left than the right Neurologic: Sensation grossly intact in extremities.  Symmetrical.  Speech is fluent.  Psychiatric: Judgment intact, Mood & affect appropriate for pt's clinical situation. Dermatologic: No rashes or  ulcers noted.  No cellulitis or open wounds.  Stasis changes as described above       Labs No results found for this or any previous visit (from the past 2160 hour(s)).  Radiology No results found.    Assessment/Plan Essential  hypertension, benign blood pressure control important in reducing the progression of atherosclerotic disease. On appropriate oral medications.   Diabetes ([place]) blood glucose control important in reducing the progression of atherosclerotic disease. Also, involved in wound healing. On appropriate medications.   Swelling of limb Prominent. Likely related to postphlebitic issues although reflux can certainly be contributing at least a little bit on the right.  Pain in limb His noninvasive studies today demonstrate no evidence of deep venous thrombosis or superficial thrombophlebitis in either lower triphasic arterial waveforms are seen in the tibial vessels and a formal arterial study is not done given this finding he has had maintained arterial perfusion on all of his previous studies as well.  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  He has reflux in the common femoral vein and the great saphenous vein in the right lower extremity.  His left lower extremity is the more severely affected of the 2 legs. We discussed options today.  He has continue to wear compression stockings and elevate his legs.  We discussed that venous reflux in the right great saphenous vein could be contributing somewhat, but I think his benefit would be fairly limited given his deep venous reflux and the fact that his left leg is the more severely affected of the 2 legs.  I believe the postphlebitic symptoms are the primary issue.  He voices his understanding and is not interested in having any procedures unless these are clearly going to be of benefit which this is not.  He will return on as-needed basis.    Leotis Pain, MD  05/13/2017 4:37 PM    This note was created with Dragon medical transcription system.  Any errors from dictation are purely unintentional

## 2017-05-13 NOTE — Assessment & Plan Note (Signed)
His noninvasive studies today demonstrate no evidence of deep venous thrombosis or superficial thrombophlebitis in either lower triphasic arterial waveforms are seen in the tibial vessels and a formal arterial study is not done given this finding he has had maintained arterial perfusion on all of his previous studies as well.  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  He has reflux in the common femoral vein and the great saphenous vein in the right lower extremity.  His left lower extremity is the more severely affected of the 2 legs. We discussed options today.  He has continue to wear compression stockings and elevate his legs.  We discussed that venous reflux in the right great saphenous vein could be contributing somewhat, but I think his benefit would be fairly limited given his deep venous reflux and the fact that his left leg is the more severely affected of the 2 legs.  I believe the postphlebitic symptoms are the primary issue.  He voices his understanding and is not interested in having any procedures unless these are clearly going to be of benefit which this is not.  He will return on as-needed basis.

## 2017-06-24 DIAGNOSIS — D692 Other nonthrombocytopenic purpura: Secondary | ICD-10-CM | POA: Insufficient documentation

## 2017-07-30 ENCOUNTER — Other Ambulatory Visit: Payer: Self-pay | Admitting: Otolaryngology

## 2017-07-30 DIAGNOSIS — H90A22 Sensorineural hearing loss, unilateral, left ear, with restricted hearing on the contralateral side: Secondary | ICD-10-CM

## 2017-08-05 ENCOUNTER — Ambulatory Visit: Payer: Medicare Other

## 2017-08-09 ENCOUNTER — Ambulatory Visit
Admission: RE | Admit: 2017-08-09 | Discharge: 2017-08-09 | Disposition: A | Discharge status: Home / Self Care | Payer: Medicare Other | Source: Ambulatory Visit | Attending: Otolaryngology | Admitting: Otolaryngology

## 2017-08-09 DIAGNOSIS — H90A22 Sensorineural hearing loss, unilateral, left ear, with restricted hearing on the contralateral side: Secondary | ICD-10-CM | POA: Diagnosis present

## 2017-08-09 LAB — POCT I-STAT CREATININE: CREATININE: 0.9 mg/dL (ref 0.61–1.24)

## 2017-08-09 MED ORDER — GADOBENATE DIMEGLUMINE 529 MG/ML IV SOLN
20.0000 mL | Freq: Once | INTRAVENOUS | Status: AC | PRN
  Administered 2017-08-09: 20 mL via INTRAVENOUS

## 2017-11-10 NOTE — Progress Notes (Deleted)
The patient is a 72 year old male with an extensive medical history, which includes atrial fibrillation, diabetes, prior DVT, bladder cancer, chronic pain and a history of COPD.  Patient had previously seen Dr. Raul Del, review of previous lung function testing in June 2018 showed an FEV1 of 79% predicted.   12/26/16 SPIROMETRY: FVC was 3.91 liters, 90% of predicted FEV1 was 2.72, 79% of predicted FEV1 ratio was 70 FEF 25-75% liters per second was 49% of predicted  LUNG VOLUMES: TLC was 77% of predicted RV was 50% of predicted  DIFFUSION CAPACITY: DLCO was 64% of predicted DLCO/VA was 84% of predicted  FLOW VOLUME LOOP: Expiratory flow volume loop is somewhat delayed  Impression Mild obstruction on spirometry TLC is slightly decreased DLCO is moderately decreased

## 2017-11-11 ENCOUNTER — Institutional Professional Consult (permissible substitution): Payer: Medicare Other | Admitting: Internal Medicine

## 2017-11-17 NOTE — Progress Notes (Signed)
[facility] Consultation      Assessment and Plan:  Dyspnea on exertion. -This is multifactorial, from back pain with deconditioning. 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  No need for treatment at this time.  History of obstructive sleep apnea with alveolar hypoventilation, elevated CO2 seen on chemistry panel. -Review of most recent chemistry panel shows elevated CO2 levels, which may be suggestive of hypercapnia.  Suspect this is secondary to chronic narcotic use for chronic back pain. - Will check sleep study to look for evidence of sleep apnea and started on CPAP as indicated.  Orders Placed This Encounter  Procedures  . DG Sniff Test  . Split night study   Return in about 3 months (around 02/17/2018).    Date: 11/18/2017  MRN# [id number] [name] 1945-08-01    [name] is a 72 y.o. old male seen in consultation for chief complaint of:    Chief Complaint  Patient presents with  . Consult    Referred by Dr. 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  . Shortness of Breath    with and without exertion. pt on 2L 02 qhs    HPI:   The patient is a 71 year old male with an extensive medical history, which includes atrial fibrillation, diabetes, prior DVT, bladder cancer, chronic pain and a history of COPD.  Patient had previously seen Dr. Raul Del, review of previous lung function testing in June 2018 showed an FEV1 of 79% predicted.  He has trouble breathing with minimal activity. He lives at home with wife and daughters he does not drive a car. He has chronic back issues and walks with a walker. He has chronic leg swelling and wears bilat stockings. He has dyspnea with bending over, bathing.  He is limited more by his back issues than by breathing issues. He has a crushed disk s/p fusion and is on a pain pump. He also takes zanaflex, oxymorphone, diazepam.  He has poor sleep habits, he goes to bed at  10 pm, wakes at 12:30 mn, then will be up and do things, goes back to bed around 2am. Might do this a few more times over night.  He takes no inhalers currently. He is on oxygen at night at 2L. He has been tested for OSA, he was on CPAP but was stopped stopped about 5 years ago. He does not think that he has trouble tolerating it.   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  Chem panel 06/18/17; CO2 elevated at 32.  CBC with diff 06/18/17; [id number].   Pulmonary function testing tracings personally reviewed, consistent with mild COPD. 12/26/16 SPIROMETRY: FVC was 3.91 liters, 90% of predicted FEV1 was 2.72, 79% of predicted FEV1 ratio was 70 FEF 25-75% liters per second was 49% of predicted  LUNG VOLUMES: TLC was 77% of predicted RV was 50% of predicted  DIFFUSION CAPACITY: DLCO was 64% of predicted DLCO/VA was 84% of predicted  FLOW VOLUME LOOP: Expiratory flow volume loop is somewhat delayed  Impression Mild obstruction on spirometry TLC is slightly decreased DLCO is moderately decreased   PMHX:   Past Medical History:  Diagnosis Date  . Anxiety   . Arthritis    INFLAMMATORY POLYARTHROPATHY  . Asthma   . CHF (congestive heart failure) ([place])   . COPD (chronic obstructive pulmonary disease) ([place])   . Coronary artery disease    2000  .  Depression   . Diabetes mellitus without complication ([place])   . DVT of lower extremity, bilateral ([place])   . Dysrhythmia    afib  . Hypertension   . Neuropathy   . Pain    chronic syndrome  . Peripheral vascular disease ([place])   . S/P IVC filter    removed  . Sleep apnea    o2  2.5 l hs  . Sleep disorder   . Stasis dermatitis of both legs    Surgical Hx:  Past Surgical History:  Procedure Laterality Date  . BACK SURGERY     lumbar fusion  . BLADDER TUMOR EXCISION    . CERVICAL FUSION    . COLON SURGERY     bowel blockage  . CORONARY ANGIOPLASTY     2000  . INFUSION PUMP REPLACEMENT    .  INTRATHECAL PUMP IMPLANTATION    . MASTOIDECTOMY     x 3  . NISSEN FUNDOPLICATION    . 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   Procedure: TRANSURETHRAL RESECTION OF BLADDER TUMOR (TURBT);  Surgeon: Royston Cowper, MD;  Location: [facility] ORS;  Service: Urology;  Laterality: N/A;   Family Hx:  Family History  Problem Relation Age of Onset  . Heart disease Mother   . Emphysema Father   . Heart attack Father   . Emphysema Sister   . Heart disease Brother    Social Hx:   Social History   Tobacco Use  . Smoking status: Current Every Day Smoker  . Smokeless tobacco: Never Used  Substance Use Topics  . Alcohol use: No  . Drug use: No   Medication:    Current Outpatient Medications:  .  amitriptyline (ELAVIL) 100 MG tablet, Take 100 mg by mouth at bedtime., Disp: , Rfl:  .  atenolol (TENORMIN) 25 MG tablet, Take by mouth daily., Disp: , Rfl:  .  bisacodyl (DULCOLAX) 5 MG EC tablet, Take 5 mg by mouth daily as needed for moderate constipation., Disp: , Rfl:  .  Bupivacaine HCl (BUPIVACAINE ON-Q PAIN PUMP, FOR ORDER SET NO CHG,), by Other route once. Intrathecal pain pump, Disp: , Rfl:  .  diazepam (VALIUM) 5 MG tablet, Take 5 mg by mouth every 8 (eight) hours as needed for anxiety., Disp: , Rfl:  .  fexofenadine (ALLEGRA) 180 MG tablet, Take 180 mg by mouth daily., Disp: , Rfl:  .  gabapentin (NEURONTIN) 600 MG tablet, Take 600 mg by mouth 4 (four) times daily., Disp: , Rfl:  .  hydrOXYzine (ATARAX/VISTARIL) 10 MG tablet, Take 10 mg by mouth 3 (three) times daily as needed., Disp: , Rfl:  .  levothyroxine (SYNTHROID, LEVOTHROID) 75 MCG tablet, Take 75 mcg by mouth daily before breakfast., Disp: , Rfl:  .  magnesium oxide (MAG-OX) 400 MG tablet, Take 400 mg by mouth daily., Disp: , Rfl:  .  Methen-Hyosc-Meth Blue-Na Phos (UROGESIC-BLUE) 81.6 MG TABS, Take 1 tablet (81.6 mg total) by mouth every 6 (six) hours as needed (dysuria)., Disp: 40 tablet, Rfl: 3 .  Multiple Vitamin  (MULTIVITAMIN) capsule, Take 1 capsule by mouth daily., Disp: , Rfl:  .  Omega-3 Fatty Acids (FISH OIL) 1200 MG CAPS, Take 1 capsule by mouth daily., Disp: , Rfl:  .  oxymorphone (OPANA) 10 MG tablet, Take by mouth., Disp: , Rfl:  .  promethazine (PHENERGAN) 12.5 MG tablet, Take 12.5 mg by mouth every 6 (six) hours as needed for nausea or vomiting., Disp: , Rfl:  .  simvastatin (ZOCOR) 40 MG tablet, Take 40 mg by mouth daily., Disp: , Rfl:  .  tiZANidine (ZANAFLEX) 4 MG capsule, Take 4 mg by mouth at bedtime as needed for muscle spasms. 1 to 2 tab, Disp: , Rfl:  .  warfarin (COUMADIN) 6 MG tablet, Take 6 mg by mouth daily. , Disp: , Rfl:    Allergies:  Codeine; Levofloxacin; Morphine and related; Oxycontin [oxycodone hcl]; and Penicillins  Review of Systems: Gen:  Denies  fever, sweats, chills HEENT: Denies blurred vision, double vision. bleeds, sore throat Cvc:  No dizziness, chest pain. Resp:   Denies cough or sputum production, shortness of breath Gi: Denies swallowing difficulty, stomach pain. Gu:  Denies bladder incontinence, burning urine Ext:   No Joint pain, stiffness. Skin: No skin rash,  hives  Endoc:  No polyuria, polydipsia. Psych: No depression, insomnia. Other:  All other systems were reviewed with the patient and were negative other that what is mentioned in the HPI.   Physical Examination:   VS: BP 110/60 (BP Location: Left Arm, Cuff Size: Large)   Pulse 100   Resp 16   Ht 6\' 2"  (1.88 m)   SpO2 100%   BMI 27.35 kg/m   General Appearance: No distress  Neuro:without focal findings,  speech normal,  HEENT: PERRLA, EOM intact.   Pulmonary: normal breath sounds, No wheezing.  CardiovascularNormal S1,S2.  No m/r/g.   Abdomen: Benign, Soft, non-tender. Renal:  No costovertebral tenderness  GU:  No performed at this time. Endoc: No evident thyromegaly, no signs of acromegaly. Skin:   warm, no rashes, no ecchymosis  Extremities: Has changes of chronic venous stasis.  no cyanosis, clubbing.  Other findings:    LABORATORY PANEL:   CBC No results for input(s): WBC, HGB, HCT, PLT in the last 168 hours. ------------------------------------------------------------------------------------------------------------------  Chemistries  No results for input(s): NA, K, CL, CO2, GLUCOSE, BUN, CREATININE, CALCIUM, MG, AST, ALT, ALKPHOS, BILITOT in the last 168 hours.  Invalid input(s): GFRCGP ------------------------------------------------------------------------------------------------------------------  Cardiac Enzymes No results for input(s): TROPONINI in the last 168 hours. ------------------------------------------------------------  RADIOLOGY:  No results found.     Thank  you for the consultation and for allowing [facility] to assist in the care of your patient. Our recommendations are noted above.  Please contact us if we can be of further service.   Marda Stalker, MD.  Board Certified in Internal Medicine, Pulmonary Medicine, [place], and Sleep Medicine.  [facility] Office Number: [phone]  Patricia Pesa, M.D.  Merton Border, M.D  11/18/2017

## 2017-11-18 ENCOUNTER — Encounter: Payer: Self-pay | Admitting: Internal Medicine

## 2017-11-18 ENCOUNTER — Ambulatory Visit (INDEPENDENT_AMBULATORY_CARE_PROVIDER_SITE_OTHER): Payer: Medicare Other | Admitting: Internal Medicine

## 2017-11-18 VITALS — BP 110/60 | HR 100 | Resp 16 | Ht 74.0 in

## 2017-11-18 DIAGNOSIS — G4733 Obstructive sleep apnea (adult) (pediatric): Secondary | ICD-10-CM | POA: Diagnosis not present

## 2017-11-18 DIAGNOSIS — J449 Chronic obstructive pulmonary disease, unspecified: Secondary | ICD-10-CM

## 2017-11-18 NOTE — Patient Instructions (Addendum)
Will send you for a sniff test to look for paralyzed diaphragm.  Will send for sleep study.

## 2017-11-30 DIAGNOSIS — K219 Gastro-esophageal reflux disease without esophagitis: Secondary | ICD-10-CM | POA: Insufficient documentation

## 2017-12-01 ENCOUNTER — Ambulatory Visit: Admission: RE | Admit: 2017-12-01 | Payer: Medicare Other | Source: Ambulatory Visit

## 2017-12-10 ENCOUNTER — Other Ambulatory Visit: Payer: Self-pay | Admitting: Nurse Practitioner

## 2017-12-10 DIAGNOSIS — K219 Gastro-esophageal reflux disease without esophagitis: Secondary | ICD-10-CM

## 2017-12-10 DIAGNOSIS — R1319 Other dysphagia: Secondary | ICD-10-CM

## 2017-12-23 ENCOUNTER — Ambulatory Visit
Admission: RE | Admit: 2017-12-23 | Discharge: 2017-12-23 | Disposition: A | Discharge status: Home / Self Care | Payer: Medicare Other | Source: Ambulatory Visit | Attending: Nurse Practitioner | Admitting: Nurse Practitioner

## 2017-12-23 ENCOUNTER — Ambulatory Visit: Admission: RE | Admit: 2017-12-23 | Payer: Medicare Other | Source: Ambulatory Visit

## 2017-12-23 ENCOUNTER — Ambulatory Visit
Admission: RE | Admit: 2017-12-23 | Discharge: 2017-12-23 | Disposition: A | Discharge status: Home / Self Care | Payer: Medicare Other | Source: Ambulatory Visit | Attending: Internal Medicine | Admitting: Internal Medicine

## 2017-12-23 DIAGNOSIS — G4733 Obstructive sleep apnea (adult) (pediatric): Secondary | ICD-10-CM | POA: Diagnosis not present

## 2017-12-23 DIAGNOSIS — J986 Disorders of diaphragm: Secondary | ICD-10-CM | POA: Insufficient documentation

## 2017-12-23 DIAGNOSIS — J449 Chronic obstructive pulmonary disease, unspecified: Secondary | ICD-10-CM | POA: Insufficient documentation

## 2017-12-23 DIAGNOSIS — K219 Gastro-esophageal reflux disease without esophagitis: Secondary | ICD-10-CM

## 2017-12-29 ENCOUNTER — Ambulatory Visit: Payer: Medicare Other

## 2017-12-29 ENCOUNTER — Other Ambulatory Visit: Payer: Medicare Other

## 2018-02-11 NOTE — Progress Notes (Signed)
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 -This is multifactorial, from back pain with deconditioning, diaphragmatic paralysis. 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  No need for treatment at this time. - Explained that right diaphragmatic paralysis cannot be treated, he needs to take it slowly when he is active and understand that he will have dyspnea.  History of obstructive sleep apnea with alveolar hypoventilation, elevated CO2 seen on chemistry panel. -Review of most recent chemistry panel shows elevated CO2 levels, which may be suggestive of hypercapnia.  Suspect this is secondary to chronic narcotic use for chronic back pain.   Obstructive sleep apnea.  -History of obstructive sleep apnea, not currently on CPAP. -We discussed potentially restarting CPAP, over he would like to call his current CPAP supplier to see if he can get a new machine, if this is declined, he is asked to call us and we can order a new sleep study in order to requalify him for CPAP.   Return in about 6 months (around 08/15/2018).    Date: 02/11/2018  MRN# [id number] [name] 1946/07/20    [name] is a 72 y.o. old male seen in consultation for chief complaint of:    Chief Complaint  Patient presents with  . Shortness of Breath    pt here for f/u with result of SNIFF test.Pt decided not to go thru with sleep study.  . Shortness of Breath    with exertion/ and cough qhs/wheezing occasional and chest tightness.    HPI:   The patient is a 72 year old male with an extensive medical history, which includes atrial fibrillation, diabetes, prior DVT, bladder cancer, chronic pain and a history of COPD.  Patient had previously seen Dr. Raul Del, review of previous lung function testing in June 2018 showed an FEV1 of 79% predicted. He has been tested for OSA, he was on CPAP but was stopped  stopped about 5 years ago. He does not think that he has trouble tolerating it.  He was sent for a split-night study.  He decided not to do the sleep study. His sniff test showed paralysis of the right diaphragm. Since his last visit he has fallen on his left side and required stitches in his left hand.   Chem panel 06/18/17; CO2 elevated at 32.  CBC with diff 06/18/17; [id number].   **Sniff test 12/23/2017>> paralysis with paradoxical movement of the right diaphragm.  **Pulmonary function testing tracings personally reviewed, consistent with mild COPD. 12/26/16 SPIROMETRY: FVC was 3.91 liters, 90% of predicted FEV1 was 2.72, 79% of predicted FEV1 ratio was 70 FEF 25-75% liters per second was 49% of predicted  LUNG VOLUMES: TLC was 77% of predicted RV was 50% of predicted  DIFFUSION CAPACITY: DLCO was 64% of predicted DLCO/VA was 84% of predicted  FLOW VOLUME LOOP: Expiratory flow volume loop is somewhat delayed  Impression Mild obstruction on spirometry TLC is slightly decreased DLCO is moderately decreased  Medication:    Current Outpatient Medications:  .  amitriptyline (ELAVIL) 100 MG tablet, Take 100 mg by mouth at bedtime., Disp: , Rfl:  .  atenolol (TENORMIN) 25 MG tablet, Take by mouth daily., Disp: , Rfl:  .  bisacodyl (DULCOLAX) 5 MG EC tablet, Take 5 mg by mouth daily as needed for moderate constipation., Disp: , Rfl:  .  Bupivacaine HCl (BUPIVACAINE ON-Q PAIN PUMP, FOR ORDER SET NO CHG,), by Other route  once. Intrathecal pain pump, Disp: , Rfl:  .  diazepam (VALIUM) 5 MG tablet, Take 5 mg by mouth every 8 (eight) hours as needed for anxiety., Disp: , Rfl:  .  fexofenadine (ALLEGRA) 180 MG tablet, Take 180 mg by mouth daily., Disp: , Rfl:  .  gabapentin (NEURONTIN) 600 MG tablet, Take 600 mg by mouth 4 (four) times daily., Disp: , Rfl:  .  hydrOXYzine (ATARAX/VISTARIL) 10 MG tablet, Take 10 mg by mouth 3 (three) times daily as needed., Disp: , Rfl:  .  levothyroxine  (SYNTHROID, LEVOTHROID) 75 MCG tablet, Take 75 mcg by mouth daily before breakfast., Disp: , Rfl:  .  magnesium oxide (MAG-OX) 400 MG tablet, Take 400 mg by mouth daily., Disp: , Rfl:  .  Methen-Hyosc-Meth Blue-Na Phos (UROGESIC-BLUE) 81.6 MG TABS, Take 1 tablet (81.6 mg total) by mouth every 6 (six) hours as needed (dysuria)., Disp: 40 tablet, Rfl: 3 .  Multiple Vitamin (MULTIVITAMIN) capsule, Take 1 capsule by mouth daily., Disp: , Rfl:  .  Omega-3 Fatty Acids (FISH OIL) 1200 MG CAPS, Take 1 capsule by mouth daily., Disp: , Rfl:  .  oxymorphone (OPANA) 10 MG tablet, Take by mouth., Disp: , Rfl:  .  promethazine (PHENERGAN) 12.5 MG tablet, Take 12.5 mg by mouth every 6 (six) hours as needed for nausea or vomiting., Disp: , Rfl:  .  simvastatin (ZOCOR) 40 MG tablet, Take 40 mg by mouth daily., Disp: , Rfl:  .  tiZANidine (ZANAFLEX) 4 MG capsule, Take 4 mg by mouth at bedtime as needed for muscle spasms. 1 to 2 tab, Disp: , Rfl:  .  warfarin (COUMADIN) 6 MG tablet, Take 6 mg by mouth daily. , Disp: , Rfl:    Allergies:  Codeine; Levofloxacin; Morphine and related; Oxycontin [oxycodone hcl]; and Penicillins  Review of Systems:  Constitutional: Feels well. Cardiovascular: No chest pain.  Pulmonary: Denies dyspnea.   The remainder of systems were reviewed and were found to be negative other than what is documented in the HPI.    Physical Examination:   VS: BP 118/64 (BP Location: Left Arm, Cuff Size: Large)   Pulse 72   Resp 16   Ht 6\' 2"  (1.88 m)   Wt 216 lb (98 kg)   SpO2 94%   BMI 27.73 kg/m   General Appearance: No distress  Neuro:without focal findings, mental status, speech normal, alert and oriented HEENT: PERRLA, EOM intact Pulmonary: No wheezing, No rales  CardiovascularNormal S1,S2.  No m/r/g.  Abdomen: Benign, Soft, non-tender, No masses Renal:  No costovertebral tenderness  GU:  No performed at this time. Endoc: No evident thyromegaly, no signs of acromegaly or Cushing  features Skin:   warm, no rashes, no ecchymosis  Extremities: normal, no cyanosis, clubbing.       LABORATORY PANEL:   CBC No results for input(s): WBC, HGB, HCT, PLT in the last 168 hours. ------------------------------------------------------------------------------------------------------------------  Chemistries  No results for input(s): NA, K, CL, CO2, GLUCOSE, BUN, CREATININE, CALCIUM, MG, AST, ALT, ALKPHOS, BILITOT in the last 168 hours.  Invalid input(s): GFRCGP ------------------------------------------------------------------------------------------------------------------  Cardiac Enzymes No results for input(s): TROPONINI in the last 168 hours. ------------------------------------------------------------  RADIOLOGY:  No results found.     Thank  you for the consultation and for allowing [facility] to assist in the care of your patient. Our recommendations are noted above.  Please contact us if we can be of further service.  Marda Stalker, M.D., F.C.C.P.  Board Certified  in Internal Medicine, Pulmonary Medicine, Critical Care Medicine, and Sleep Medicine.  [facility] Office Number: [phone]  02/11/2018

## 2018-02-12 ENCOUNTER — Encounter: Payer: Self-pay | Admitting: Internal Medicine

## 2018-02-12 ENCOUNTER — Ambulatory Visit (INDEPENDENT_AMBULATORY_CARE_PROVIDER_SITE_OTHER): Payer: Medicare Other | Admitting: Internal Medicine

## 2018-02-12 VITALS — BP 118/64 | HR 72 | Resp 16 | Ht 74.0 in | Wt 216.0 lb

## 2018-02-12 DIAGNOSIS — G4733 Obstructive sleep apnea (adult) (pediatric): Secondary | ICD-10-CM | POA: Diagnosis not present

## 2018-02-12 DIAGNOSIS — J449 Chronic obstructive pulmonary disease, unspecified: Secondary | ICD-10-CM | POA: Diagnosis not present

## 2018-02-12 NOTE — Patient Instructions (Addendum)
You have a paralyzed right diaphragm, remember when you get winded you need to slow down and catch your breath.   Call your cpap provider about getting a new machine.

## 2018-02-26 ENCOUNTER — Encounter: Payer: Self-pay | Admitting: *Deleted

## 2018-02-27 ENCOUNTER — Ambulatory Visit: Payer: Medicare Other | Admitting: Anesthesiology

## 2018-02-27 ENCOUNTER — Other Ambulatory Visit: Payer: Self-pay

## 2018-02-27 ENCOUNTER — Encounter: Payer: Self-pay | Admitting: Anesthesiology

## 2018-02-27 ENCOUNTER — Ambulatory Visit
Admission: RE | Admit: 2018-02-27 | Discharge: 2018-02-27 | Disposition: A | Discharge status: Home / Self Care | Payer: Medicare Other | Source: Ambulatory Visit | Attending: Unknown Physician Specialty | Admitting: Unknown Physician Specialty

## 2018-02-27 ENCOUNTER — Encounter
Admission: RE | Disposition: A | Discharge status: Home / Self Care | Payer: Self-pay | Source: Ambulatory Visit | Attending: Unknown Physician Specialty

## 2018-02-27 DIAGNOSIS — D123 Benign neoplasm of transverse colon: Secondary | ICD-10-CM | POA: Diagnosis not present

## 2018-02-27 DIAGNOSIS — E119 Type 2 diabetes mellitus without complications: Secondary | ICD-10-CM | POA: Diagnosis not present

## 2018-02-27 DIAGNOSIS — F17200 Nicotine dependence, unspecified, uncomplicated: Secondary | ICD-10-CM | POA: Insufficient documentation

## 2018-02-27 DIAGNOSIS — I110 Hypertensive heart disease with heart failure: Secondary | ICD-10-CM | POA: Diagnosis not present

## 2018-02-27 DIAGNOSIS — G4730 Sleep apnea, unspecified: Secondary | ICD-10-CM | POA: Insufficient documentation

## 2018-02-27 DIAGNOSIS — J449 Chronic obstructive pulmonary disease, unspecified: Secondary | ICD-10-CM | POA: Insufficient documentation

## 2018-02-27 DIAGNOSIS — Z79899 Other long term (current) drug therapy: Secondary | ICD-10-CM | POA: Insufficient documentation

## 2018-02-27 DIAGNOSIS — F329 Major depressive disorder, single episode, unspecified: Secondary | ICD-10-CM | POA: Insufficient documentation

## 2018-02-27 DIAGNOSIS — D122 Benign neoplasm of ascending colon: Secondary | ICD-10-CM | POA: Insufficient documentation

## 2018-02-27 DIAGNOSIS — D124 Benign neoplasm of descending colon: Secondary | ICD-10-CM | POA: Diagnosis not present

## 2018-02-27 DIAGNOSIS — Z1211 Encounter for screening for malignant neoplasm of colon: Secondary | ICD-10-CM | POA: Insufficient documentation

## 2018-02-27 DIAGNOSIS — Z86718 Personal history of other venous thrombosis and embolism: Secondary | ICD-10-CM | POA: Diagnosis not present

## 2018-02-27 DIAGNOSIS — I4891 Unspecified atrial fibrillation: Secondary | ICD-10-CM | POA: Insufficient documentation

## 2018-02-27 DIAGNOSIS — F419 Anxiety disorder, unspecified: Secondary | ICD-10-CM | POA: Diagnosis not present

## 2018-02-27 DIAGNOSIS — I2510 Atherosclerotic heart disease of native coronary artery without angina pectoris: Secondary | ICD-10-CM | POA: Diagnosis not present

## 2018-02-27 DIAGNOSIS — I509 Heart failure, unspecified: Secondary | ICD-10-CM | POA: Diagnosis not present

## 2018-02-27 DIAGNOSIS — Z7901 Long term (current) use of anticoagulants: Secondary | ICD-10-CM | POA: Diagnosis not present

## 2018-02-27 HISTORY — PX: COLONOSCOPY WITH PROPOFOL: SHX5780

## 2018-02-27 LAB — GLUCOSE, CAPILLARY: Glucose-Capillary: 113 mg/dL — ABNORMAL HIGH (ref 70–99)

## 2018-02-27 SURGERY — COLONOSCOPY WITH PROPOFOL
Anesthesia: General

## 2018-02-27 MED ORDER — PROPOFOL 500 MG/50ML IV EMUL
INTRAVENOUS | Status: DC | PRN
  Administered 2018-02-27: 100 ug/kg/min via INTRAVENOUS

## 2018-02-27 MED ORDER — MIDAZOLAM HCL 2 MG/2ML IJ SOLN
INTRAMUSCULAR | Status: AC
  Filled 2018-02-27: qty 2

## 2018-02-27 MED ORDER — SODIUM CHLORIDE 0.9 % IV SOLN
INTRAVENOUS | Status: DC
  Administered 2018-02-27: 10:00:00 via INTRAVENOUS

## 2018-02-27 MED ORDER — MIDAZOLAM HCL 2 MG/2ML IJ SOLN
INTRAMUSCULAR | Status: DC | PRN
  Administered 2018-02-27: 2 mg via INTRAVENOUS

## 2018-02-27 MED ORDER — EPHEDRINE SULFATE 50 MG/ML IJ SOLN
INTRAMUSCULAR | Status: AC
  Filled 2018-02-27: qty 1

## 2018-02-27 MED ORDER — PHENYLEPHRINE HCL 10 MG/ML IJ SOLN
INTRAMUSCULAR | Status: DC | PRN
  Administered 2018-02-27 (×5): 50 ug via INTRAVENOUS

## 2018-02-27 MED ORDER — SODIUM CHLORIDE 0.9 % IV SOLN: INTRAVENOUS | Status: DC

## 2018-02-27 MED ORDER — PROPOFOL 500 MG/50ML IV EMUL
INTRAVENOUS | Status: AC
  Filled 2018-02-27: qty 50

## 2018-02-27 MED ORDER — FENTANYL CITRATE (PF) 100 MCG/2ML IJ SOLN
INTRAMUSCULAR | Status: AC
  Filled 2018-02-27: qty 2

## 2018-02-27 MED ORDER — PHENYLEPHRINE HCL 10 MG/ML IJ SOLN
INTRAMUSCULAR | Status: AC
  Filled 2018-02-27: qty 1

## 2018-02-27 MED ORDER — EPHEDRINE SULFATE 50 MG/ML IJ SOLN
INTRAMUSCULAR | Status: DC | PRN
  Administered 2018-02-27 (×3): 10 mg via INTRAVENOUS
  Administered 2018-02-27: 5 mg via INTRAVENOUS
  Administered 2018-02-27: 15 mg via INTRAVENOUS
  Administered 2018-02-27: 10 mg via INTRAVENOUS
  Administered 2018-02-27: 5 mg via INTRAVENOUS

## 2018-02-27 MED ORDER — LIDOCAINE HCL (CARDIAC) PF 100 MG/5ML IV SOSY
PREFILLED_SYRINGE | INTRAVENOUS | Status: DC | PRN
  Administered 2018-02-27: 30 mg via INTRAVENOUS

## 2018-02-27 MED ORDER — FENTANYL CITRATE (PF) 100 MCG/2ML IJ SOLN
INTRAMUSCULAR | Status: DC | PRN
  Administered 2018-02-27: 50 ug via INTRAVENOUS

## 2018-02-27 NOTE — H&P (Signed)
Primary Care Physician:  Adin Hector, MD Primary Gastroenterologist:  Dr. Vira Agar  Pre-Procedure History & Physical: HPI:  [name] is a 72 y.o. male is here for an colonoscopy.  For personal history of colon polyps.   Past Medical History:  Diagnosis Date  . Anxiety   . Arthritis    INFLAMMATORY POLYARTHROPATHY  . Asthma   . CHF (congestive heart failure) ([place])   . COPD (chronic obstructive pulmonary disease) ([place])   . Coronary artery disease    2000  . Depression   . Diabetes mellitus without complication ([place])   . DVT of lower extremity, bilateral ([place])   . Dysrhythmia    afib  . Hypertension   . Neuropathy   . Pain    chronic syndrome  . Peripheral vascular disease ([place])   . S/P IVC filter    removed  . Sleep apnea    o2  2.5 l hs  . Sleep disorder   . Stasis dermatitis of both legs     Past Surgical History:  Procedure Laterality Date  . BACK SURGERY     lumbar fusion  . BLADDER TUMOR EXCISION    . CERVICAL FUSION    . COLON SURGERY     bowel blockage  . CORONARY ANGIOPLASTY     2000  . INFUSION PUMP REPLACEMENT    . INTRATHECAL PUMP IMPLANTATION    . MASTOIDECTOMY     x 3  . NISSEN FUNDOPLICATION    . 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   Procedure: TRANSURETHRAL RESECTION OF BLADDER TUMOR (TURBT);  Surgeon: Royston Cowper, MD;  Location: [facility] ORS;  Service: Urology;  Laterality: N/A;    Prior to Admission medications   Medication Sig Start Date End Date Taking? Authorizing Provider  amitriptyline (ELAVIL) 100 MG tablet Take 100 mg by mouth at bedtime.   Yes [provider]  atenolol (TENORMIN) 25 MG tablet Take by mouth daily.   Yes [provider]  bisacodyl (DULCOLAX) 5 MG EC tablet Take 5 mg by mouth daily as needed for moderate constipation.   Yes [provider]  Bupivacaine HCl (BUPIVACAINE ON-Q PAIN PUMP, FOR ORDER SET NO CHG,) by Other route once. Intrathecal pain pump   Yes  [provider]  diazepam (VALIUM) 5 MG tablet Take 5 mg by mouth every 8 (eight) hours as needed for anxiety.   Yes [provider]  fexofenadine (ALLEGRA) 180 MG tablet Take 180 mg by mouth daily.   Yes [provider]  gabapentin (NEURONTIN) 600 MG tablet Take 600 mg by mouth 4 (four) times daily.   Yes [provider]  hydrOXYzine (ATARAX/VISTARIL) 10 MG tablet Take 10 mg by mouth 3 (three) times daily as needed.   Yes [provider]  levothyroxine (SYNTHROID, LEVOTHROID) 75 MCG tablet Take 75 mcg by mouth daily before breakfast.   Yes [provider]  magnesium oxide (MAG-OX) 400 MG tablet Take 400 mg by mouth daily.   Yes [provider]  Multiple Vitamin (MULTIVITAMIN) capsule Take 1 capsule by mouth daily.   Yes [provider]  Omega-3 Fatty Acids (FISH OIL) 1200 MG CAPS Take 1 capsule by mouth daily.   Yes [provider]  oxymorphone (OPANA) 10 MG tablet Take by mouth. 01/21/17  Yes [provider]  promethazine (PHENERGAN) 12.5 MG tablet Take 12.5 mg by mouth every 6 (six) hours as needed for nausea or vomiting.   Yes [provider]  simvastatin (ZOCOR) 40 MG tablet Take 40 mg by mouth daily.   Yes [provider]  tiZANidine (ZANAFLEX) 4 MG capsule Take 4 mg by mouth at bedtime as needed for muscle spasms. 1 to 2 tab   Yes [provider]  warfarin (COUMADIN) 6 MG tablet Take 6 mg by mouth daily.    Yes [provider]  Methen-Hyosc-Meth Blue-Na Phos (UROGESIC-BLUE) 81.6 MG TABS Take 1 tablet (81.6 mg total) by mouth every 6 (six) hours as needed (dysuria). Patient not taking: Reported on 02/27/2018 03/07/15   Royston Cowper, MD    Allergies as of 12/16/2017 - Review Complete 11/18/2017  Allergen Reaction Noted  . Codeine  03/02/2015  . Levofloxacin Hives 03/31/2015  . Morphine and related Nausea Only 03/02/2015  . Oxycontin [oxycodone hcl] Nausea Only  03/02/2015  . Penicillins Other (See Comments) 01/10/2015    Family History  Problem Relation Age of Onset  . Heart disease Mother   . Emphysema Father   . Heart attack Father   . Emphysema Sister   . Heart disease Brother     Social History   Socioeconomic History  . Marital status: Married    Spouse name: Not on file  . Number of children: Not on file  . Years of education: Not on file  . Highest education level: Not on file  Occupational History  . Not on file  Social Needs  . Financial resource strain: Not on file  . Food insecurity:    Worry: Not on file    Inability: Not on file  . Transportation needs:    Medical: Not on file    Non-medical: Not on file  Tobacco Use  . Smoking status: Current Every Day Smoker    Packs/day: 1.00  . Smokeless tobacco: Never Used  Substance and Sexual Activity  . Alcohol use: No  . Drug use: No  . Sexual activity: Not on file  Lifestyle  . Physical activity:    Days per week: Not on file    Minutes per session: Not on file  . Stress: Not on file  Relationships  . Social connections:    Talks on phone: Not on file    Gets together: Not on file    Attends religious service: Not on file    Active member of club or organization: Not on file    Attends meetings of clubs or organizations: Not on file    Relationship status: Not on file  . Intimate partner violence:    Fear of current or ex partner: Not on file    Emotionally abused: Not on file    Physically abused: Not on file    Forced sexual activity: Not on file  Other Topics Concern  . Not on file  Social History Narrative  . Not on file    Review of Systems: See HPI, otherwise negative ROS  Physical Exam: BP (!) 146/65   Pulse 76   Temp (!) 96.5 F (35.8 C) (Tympanic)   Resp 18   Ht 6\' 2"  (1.88 m)   Wt 99.8 kg   SpO2 94%   BMI 28.25 kg/m  General:   Alert,  pleasant and cooperative in NAD Head:  Normocephalic and atraumatic. Neck:  Supple; no masses  or thyromegaly. Lungs:  Clear throughout to auscultation.    Heart:  Regular rate and rhythm. Abdomen:  Soft, nontender and nondistended. Normal bowel sounds, without guarding, and without rebound.  Neurologic:  Alert and  oriented x4;  grossly normal neurologically.  Impression/Plan: [name] is here for an colonoscopy to be performed for [facility] colon polyps.  Risks, benefits, limitations, and alternatives regarding  colonoscopy have been reviewed with the patient.  Questions have been answered.  All parties agreeable.   Gaylyn Cheers, MD  02/27/2018, 10:25 AM

## 2018-02-27 NOTE — Anesthesia Procedure Notes (Signed)
Performed by: Cook-Martin, Lakelyn Straus Pre-anesthesia Checklist: Patient identified, Emergency Drugs available, Suction available, Patient being monitored and Timeout performed Patient Re-evaluated:Patient Re-evaluated prior to induction Oxygen Delivery Method: Simple face mask Preoxygenation: Pre-oxygenation with 100% oxygen Induction Type: IV induction Ventilation: Oral airway inserted - appropriate to patient size Placement Confirmation: positive ETCO2 and CO2 detector       

## 2018-02-27 NOTE — Anesthesia Postprocedure Evaluation (Signed)
Anesthesia Post Note  Patient: [name]  Procedure(s) Performed: COLONOSCOPY WITH PROPOFOL (N/A )  Patient location during evaluation: Endoscopy Anesthesia Type: General Level of consciousness: awake and alert Pain management: pain level controlled Vital Signs Assessment: post-procedure vital signs reviewed and stable Respiratory status: spontaneous breathing, nonlabored ventilation, respiratory function stable and patient connected to nasal cannula oxygen Cardiovascular status: blood pressure returned to baseline and stable Postop Assessment: no apparent nausea or vomiting Anesthetic complications: no     Last Vitals:  Vitals:   02/27/18 1204 02/27/18 1214  BP: 120/70 119/75  Pulse: 64 65  Resp: 20 13  Temp:    SpO2: 93% 90%    Last Pain:  Vitals:   02/27/18 1214  TempSrc:   PainSc: [name]

## 2018-02-27 NOTE — Op Note (Signed)
[facility] Patient Name: [name] Procedure Date: 02/27/2018 9:54 AM MRN: [id number] Account #: [id number] Date of Birth: 08/15/1945 Admit Type: Outpatient Age: 72 Room: [facility] ENDO ROOM 1 Gender: Male Note Status: Finalized Procedure:            Colonoscopy Indications:          Screening for colorectal malignant neoplasm Providers:            Manya Silvas, MD Referring MD:         Ramonita Lab, MD (Referring MD) Medicines:            Propofol per Anesthesia Complications:        No immediate complications. Procedure:            Pre-Anesthesia Assessment:                       - After reviewing the risks and benefits, the patient                        was deemed in satisfactory condition to undergo the                        procedure.                       After obtaining informed consent, the colonoscope was                        passed under direct vision. Throughout the procedure,                        the patient's blood pressure, pulse, and oxygen                        saturations were monitored continuously. The                        Colonoscope was introduced through the anus and                        advanced to the the cecum, identified by appendiceal                        orifice and ileocecal valve. The colonoscopy was                        performed without difficulty. The patient tolerated the                        procedure. Findings:      A small polyp was found in the splenic flexure. The polyp was sessile.       The polyp was removed with a hot snare. Resection and retrieval were       complete.      A small polyp was found in the ascending colon. The polyp was sessile.       The polyp was removed with a hot snare. Resection and retrieval were       complete. To prevent bleeding after the polypectomy, one hemostatic clip       was successfully placed. There was no bleeding at the end of the  procedure.  A diminutive polyp was found in the descending colon. The polyp was       sessile. The polyp was removed with a jumbo cold forceps. Resection and       retrieval were complete.      A small polyp was found in the recto-sigmoid colon. The polyp was       sessile. The polyp was removed with a hot snare. Resection and retrieval       were complete.      The exam was otherwise without abnormality. Impression:           - One small polyp at the splenic flexure, removed with                        a hot snare. Resected and retrieved.                       - One small polyp in the ascending colon, removed with                        a hot snare. Resected and retrieved. Clip was placed.                       - One diminutive polyp in the descending colon, removed                        with a jumbo cold forceps. Resected and retrieved.                       - One small polyp at the recto-sigmoid colon, removed                        with a hot snare. Resected and retrieved.                       - The examination was otherwise normal. Recommendation:       - Await pathology results. Manya Silvas, MD 02/27/2018 10:56:34 AM This report has been signed electronically. Number of Addenda: 0 Note Initiated On: 02/27/2018 9:54 AM Scope Withdrawal Time: 0 hours 11 minutes 21 seconds  Total Procedure Duration: 0 hours 20 minutes 38 seconds       [facility]

## 2018-02-27 NOTE — Anesthesia Preprocedure Evaluation (Addendum)
Anesthesia Evaluation  Patient identified by MRN, date of birth, ID band Patient awake    Reviewed: Allergy & Precautions, NPO status , Patient's Chart, lab work & pertinent test results, reviewed documented beta blocker date and time   Airway Mallampati: III  TM Distance: >3 FB     Dental  (+) Chipped, Partial Upper   Pulmonary asthma , sleep apnea , COPD, Current Smoker,           Cardiovascular hypertension, Pt. on medications and Pt. on home beta blockers + CAD, + Peripheral Vascular Disease and +CHF  + dysrhythmias      Neuro/Psych PSYCHIATRIC DISORDERS Anxiety Depression    GI/Hepatic   Endo/Other  diabetes, Type 2  Renal/GU      Musculoskeletal  (+) Arthritis ,   Abdominal   Peds  Hematology   Anesthesia Other Findings Has intratheal pump. Uses O2 at nite. Has IVC filter. Neck movement ok. Nissen fundoplication doing ok.  Reproductive/Obstetrics                            Anesthesia Physical Anesthesia Plan  ASA: III  Anesthesia Plan: General   Post-op Pain Management:    Induction: Intravenous  PONV Risk Score and Plan:   Airway Management Planned:   Additional Equipment:   Intra-op Plan:   Post-operative Plan:   Informed Consent: I have reviewed the patients History and Physical, chart, labs and discussed the procedure including the risks, benefits and alternatives for the proposed anesthesia with the patient or authorized representative who has indicated his/her understanding and acceptance.     Plan Discussed with: CRNA  Anesthesia Plan Comments:        Anesthesia Quick Evaluation

## 2018-02-27 NOTE — Anesthesia Post-op Follow-up Note (Signed)
Anesthesia QCDR form completed.        

## 2018-02-27 NOTE — Transfer of Care (Signed)
Immediate Anesthesia Transfer of Care Note  Patient: [name]  Procedure(s) Performed: COLONOSCOPY WITH PROPOFOL (N/A )  Patient Location: PACU  Anesthesia Type:General  Level of Consciousness: awake and sedated  Airway & Oxygen Therapy: Patient Spontanous Breathing and Patient connected to face mask oxygen  Post-op Assessment: Report given to RN and Post -op Vital signs reviewed and stable  Post vital signs: Reviewed and stable  Last Vitals:  Vitals Value Taken Time  BP    Temp    Pulse    Resp    SpO2      Last Pain:  Vitals:   02/27/18 1006  TempSrc: Tympanic  PainSc: 6          Complications: No apparent anesthesia complications

## 2018-03-02 LAB — SURGICAL PATHOLOGY

## 2018-03-13 ENCOUNTER — Ambulatory Visit (INDEPENDENT_AMBULATORY_CARE_PROVIDER_SITE_OTHER): Payer: Medicare Other | Admitting: Vascular Surgery

## 2018-03-24 ENCOUNTER — Other Ambulatory Visit: Payer: Self-pay

## 2018-03-24 ENCOUNTER — Encounter (INDEPENDENT_AMBULATORY_CARE_PROVIDER_SITE_OTHER): Payer: Self-pay | Admitting: Vascular Surgery

## 2018-03-24 ENCOUNTER — Ambulatory Visit (INDEPENDENT_AMBULATORY_CARE_PROVIDER_SITE_OTHER): Payer: Medicare Other | Admitting: Vascular Surgery

## 2018-03-24 VITALS — BP 127/77 | HR 77 | Ht 74.0 in | Wt 222.0 lb

## 2018-03-24 DIAGNOSIS — I872 Venous insufficiency (chronic) (peripheral): Secondary | ICD-10-CM | POA: Insufficient documentation

## 2018-03-24 DIAGNOSIS — M7989 Other specified soft tissue disorders: Secondary | ICD-10-CM

## 2018-03-24 DIAGNOSIS — E118 Type 2 diabetes mellitus with unspecified complications: Secondary | ICD-10-CM

## 2018-03-24 DIAGNOSIS — I10 Essential (primary) hypertension: Secondary | ICD-10-CM | POA: Diagnosis not present

## 2018-03-24 NOTE — Progress Notes (Signed)
MRN : [id number]  [name] is a 72 y.o. (1945/12/14) male who presents with chief complaint of  Chief Complaint  Patient presents with  . Follow-up    knot on right and bilateral LE swelling  .  History of Present Illness: Patient returns today in follow up of leg pain and swelling.  He has a known history of deep venous insufficiency bilaterally.  He wears compression stockings throughout the day and tries to elevate his legs, but his leg pain and swelling persists.  He had a minor trauma to the right medial leg about 6 months ago and still has a knot there.  His stasis changes of the lower extremities remain quite prominent.  Current Outpatient Medications  Medication Sig Dispense Refill  . amitriptyline (ELAVIL) 100 MG tablet Take 100 mg by mouth at bedtime.    Marland Kitchen atenolol (TENORMIN) 25 MG tablet Take by mouth daily.    . bisacodyl (DULCOLAX) 5 MG EC tablet Take 5 mg by mouth daily as needed for moderate constipation.    . Bupivacaine HCl (BUPIVACAINE ON-Q PAIN PUMP, FOR ORDER SET NO CHG,) by Other route once. Intrathecal pain pump    . diazepam (VALIUM) 5 MG tablet Take 5 mg by mouth every 8 (eight) hours as needed for anxiety.    . fexofenadine (ALLEGRA) 180 MG tablet Take 180 mg by mouth daily.    Marland Kitchen gabapentin (NEURONTIN) 600 MG tablet Take 600 mg by mouth 4 (four) times daily.    Marland Kitchen levothyroxine (SYNTHROID, LEVOTHROID) 75 MCG tablet Take 75 mcg by mouth daily before breakfast.    . magnesium oxide (MAG-OX) 400 MG tablet Take 400 mg by mouth daily.    . Multiple Vitamin (MULTIVITAMIN) capsule Take 1 capsule by mouth daily.    . naloxone (NARCAN) nasal spray 4 mg/0.1 mL Place into the nose.    . Omega-3 Fatty Acids (FISH OIL) 1200 MG CAPS Take 1 capsule by mouth daily.    Marland Kitchen oxymorphone (OPANA) 10 MG tablet Take by mouth.    . promethazine (PHENERGAN) 12.5 MG tablet Take 12.5 mg by mouth every 6 (six) hours as needed for nausea or vomiting.    . simvastatin (ZOCOR) 40 MG tablet  Take 40 mg by mouth daily.    Marland Kitchen tiZANidine (ZANAFLEX) 4 MG capsule Take 4 mg by mouth at bedtime as needed for muscle spasms. 1 to 2 tab    . warfarin (COUMADIN) 6 MG tablet Take 6 mg by mouth daily.     . DULoxetine (CYMBALTA) 60 MG capsule Take by mouth.    . hydrOXYzine (ATARAX/VISTARIL) 10 MG tablet Take 10 mg by mouth 3 (three) times daily as needed.    . Methen-Hyosc-Meth Blue-Na Phos (UROGESIC-BLUE) 81.6 MG TABS Take 1 tablet (81.6 mg total) by mouth every 6 (six) hours as needed (dysuria). (Patient not taking: Reported on 02/27/2018) 40 tablet 3   No current facility-administered medications for this visit.     Past Medical History:  Diagnosis Date  . Anxiety   . Arthritis    INFLAMMATORY POLYARTHROPATHY  . Asthma   . CHF (congestive heart failure) ([place])   . COPD (chronic obstructive pulmonary disease) ([place])   . Coronary artery disease    2000  . Depression   . Diabetes mellitus without complication ([place])   . DVT of lower extremity, bilateral ([place])   . Dysrhythmia    afib  . Hypertension   . Neuropathy   . Pain  chronic syndrome  . Peripheral vascular disease ([place])   . S/P IVC filter    removed  . Sleep apnea    o2  2.5 l hs  . Sleep disorder   . Stasis dermatitis of both legs     Past Surgical History:  Procedure Laterality Date  . BACK SURGERY     lumbar fusion  . BLADDER TUMOR EXCISION    . CERVICAL FUSION    . COLON SURGERY     bowel blockage  . COLONOSCOPY WITH PROPOFOL N/A 02/27/2018   Procedure: COLONOSCOPY WITH PROPOFOL;  Surgeon: Manya Silvas, MD;  Location[facility] ENDOSCOPY;  Service: Endoscopy;  Laterality: N/A;  . CORONARY ANGIOPLASTY     2000  . INFUSION PUMP REPLACEMENT    . INTRATHECAL PUMP IMPLANTATION    . MASTOIDECTOMY     x 3  . NISSEN FUNDOPLICATION    . 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   Procedure: TRANSURETHRAL RESECTION OF BLADDER TUMOR (TURBT);  Surgeon: Royston Cowper, MD;  Location: [facility] ORS;  Service:  Urology;  Laterality: N/A;    Family History  Problem Relation Age of Onset  . Heart disease Mother   . Emphysema Father   . Heart attack Father   . Emphysema Sister   . Heart disease Brother     Social History     Social History  Substance Use Topics  . Smoking status: Current Every Day Smoker  . Smokeless tobacco: Never Used  . Alcohol use No  Married          Allergies  Allergen Reactions  . Codeine     unknown Other reaction(s): Unknown unknown  . Levofloxacin Hives  . Morphine And Related Nausea Only  . Oxycontin [Oxycodone Hcl] Nausea Only  . Penicillins Other (See Comments)    unknown Other reaction(s): Other (See Comments), Unknown unknown     REVIEW OF SYSTEMS (Negative unless checked)  Constitutional: -Weight loss  -Fever  -Chills Cardiac: -Chest pain   -Chest pressure   -Palpitations   -Shortness of breath when laying flat   -Shortness of breath at rest   +Shortness of breath with exertion. Vascular:  +Pain in legs with walking   +Pain in legs at rest   -Pain in legs when laying flat   -Claudication   -Pain in feet when walking  -Pain in feet at rest  -Pain in feet when laying flat   -History of DVT   -Phlebitis   +Swelling in legs   +Varicose veins   -Non-healing ulcers Pulmonary:   -Uses home oxygen   -Productive cough   -Hemoptysis   -Wheeze  -COPD   -Asthma Neurologic:  -Dizziness  -Blackouts   -Seizures   -History of stroke   -History of TIA  -Aphasia   -Temporary blindness   -Dysphagia   -Weakness or numbness in arms   -Weakness or numbness in legs Musculoskeletal:  -Arthritis   -Joint swelling   -Joint pain   -Low back pain Hematologic:  +Easy bruising  -Easy bleeding   -Hypercoagulable state   -Anemic   Gastrointestinal:  -Blood in stool   -Vomiting blood  -Gastroesophageal reflux/heartburn   -Abdominal pain Genitourinary:  -Chronic kidney disease   -Difficult urination  -Frequent urination  -Burning with urination    -Hematuria Skin:  -Rashes   -Ulcers   -Wounds Psychological:  +History of anxiety   - History of major depression.  Physical Examination  BP 127/77 (BP Location: Right Arm)  Pulse 77   Ht 6' 2"  (1.88 m)   Wt 222 lb (100.7 kg)   BMI 28.50 kg/m  Gen:  WD/WN, NAD Head: [place]/AT, No temporalis wasting. Ear/Nose/Throat: Hearing grossly intact, nares w/o erythema or drainage Eyes: Conjunctiva clear. Sclera non-icteric Neck: Supple.  Trachea midline Pulmonary:  Good air movement, no use of accessory muscles.  Cardiac: Irregular Vascular: Prominent stasis dermatitis changes present to both lower extremities up to the mid upper calf Vessel Right Left  Radial Palpable Palpable                          PT  trace palpable  trace palpable  DP  1+ palpable  1+ palpable   Musculoskeletal: M/S 5/5 throughout.  No deformity or atrophy.  1-2+ bilateral lower extremity edema.  Prominent varicosities more so on the right than the left Neurologic: Sensation grossly intact in extremities.  Symmetrical.  Speech is fluent.  Psychiatric: Judgment intact, Mood & affect appropriate for pt's clinical situation. Dermatologic: No rashes or ulcers noted.  No cellulitis or open wounds.       Labs Recent Results (from the past 2160 hour(s))  Glucose, capillary     Status: Abnormal   Collection Time: 02/27/18 10:22 AM  Result Value Ref Range   Glucose-Capillary 113 (H) 70 - 99 mg/dL  Surgical pathology     Status: None   Collection Time: 02/27/18 10:42 AM  Result Value Ref Range   SURGICAL PATHOLOGY      Surgical Pathology CASE: [id number] PATIENT: [facility] Surgical Pathology Report     SPECIMEN SUBMITTED: A. Colon polyp, splenic flexure;hot snare B. Colon polyp, ascending;hot snare C. Colon polyp, descending; cbx D. Colon polyp, recto-sigmoid;hot snare  CLINICAL HISTORY: None provided  PRE-OPERATIVE DIAGNOSIS: HX colon polyps  POST-OPERATIVE DIAGNOSIS: Colon polyps,  diverticulosis     DIAGNOSIS: A. COLON POLYP, SPLENIC FLEXURE; HOT SNARE: - TUBULAR ADENOMA. - NEGATIVE FOR HIGH-GRADE DYSPLASIA AND MALIGNANCY.  B.  COLON POLYP, ASCENDING; HOT SNARE: - SESSILE SERRATED ADENOMA. - TUBULAR ADENOMA. - NEGATIVE FOR HIGH-GRADE DYSPLASIA AND MALIGNANCY.  C.  COLON POLYP, DESCENDING; COLD BIOPSY: - TUBULAR ADENOMA. - NEGATIVE FOR HIGH-GRADE DYSPLASIA AND MALIGNANCY  D.  COLON POLYP, RECTOSIGMOID; HOT SNARE: - HYPERPLASTIC POLYP. - NEGATIVE FOR DYSPLASIA AND MALIGNANCY.  Comment: Sessile serrated adenomas (SSAs) are often difficult to detect en doscopically as they are typically broad flat lesions found in the proximal colon. By morphologic definition, SSAs demonstrate architectural (low grade) dysplasia, with or without concurrent cytologic dysplasia. SSAs are thought to be precursor lesions to a subset of colonic adenocarcinomas that arise through the serrated neoplastic pathway rather than the classical neoplasia pathway. Lesions of the serrated pathway have a high frequency of BRAF mutation and are more likely to be microsatellite unstable compared to tubular / villous adenomas of the classical pathway.  The recommended surveillance interval for a SSA <10 mm with no cytologic dysplasia is 5 years.  A SSA =10 mm and a sessile serrated polyp with cytological dysplasia should be managed like a high risk adenoma (recommended surveillance interval of 3 years).  Serrated polyposis syndrome, as defined by WHO, should be considered with one of the following criteria: (1) at least 5 serrated polyps proximal to  sigmoid, with 2 or more =10 mm; (2) any serrated polyps proximal to sigmoid with family history of serrated polyposis syndrome; and (3) >20 serrated polyps of any size throughout the colon.  References: Guidelines for  Colonoscopy Surveillance After Screening and Polypectomy: A Consensus Update by the [place] Multi-Society Task Force on  Colorectal Cancer. [place] [employer], 2012.  GROSS DESCRIPTION: A. Labeled: Hot snare splenic flexure polyp Received: In formalin Tissue fragment(s): Multiple Size: Aggregate, 0.9 x 0.4 x 0.1 cm Description: Pink fragment and fecal material Entirely submitted in one cassette.  B. Labeled: Hot snare ascending colon polyp Received: In formalin Tissue fragment(s): Multiple Size: Aggregate, 2.2 x 0.9 x 0.1 cm Description: Pink fragments and yellow fecal material Entirely submitted in one cassette.  C. Labeled: Cbx descending colon polyp Received: In formalin Tissue fragment(s): 1 Size: 0.5 cm Description: Pink fragm ent Entirely submitted in one cassette.  D. Labeled: Hot snare rectosigmoid colon polyp Received: In formalin Tissue fragment(s): 1 Size: 0.4 cm Description: Pink-tan fragment Entirely submitted in one cassette.   Final Diagnosis performed by Quay Burow, MD.   Electronically signed 03/02/2018 4:06:09PM The electronic signature indicates that the named Attending Pathologist has evaluated the specimen  Technical component performed at [facility], [address], [place], [place] [address] Lab: [phone] Dir: Rush Farmer, MD, MMM  Professional component performed at [facility], [facility], [place], [place], [place] [address] Lab: [phone] Dir: Dellia Nims. Reuel Derby, MD     Radiology No results found.  Assessment/Plan Essential hypertension, benign blood pressure control important in reducing the progression of atherosclerotic disease. On appropriate oral medications.   Diabetes ([place]) blood glucose control important in reducing the progression of atherosclerotic disease. Also, involved in wound healing. On appropriate medications.   Swelling of limb Prominent. Likely related to postphlebitic issues although superficial reflux can certainly be contributing at least a little bit on the right.  I have urged him  to start using his lymphedema pump again to see if that would help.  He has not used this in years.  There is not a great fix and compression and elevation are the mainstays of treatment for his deep venous insufficiency  Chronic venous insufficiency A long history of multiple thrombotic issues and now has deep venous reflux bilaterally.  Does have saphenous vein reflux on the right 2, but the role for intervention would be limited.  I think using the lymphedema pump more regularly may be of benefit and compression and elevation will need to continue.  Some of his pain is likely neuropathic in nature and he is already on a reasonably high dose of Neurontin and Cymbalta.  He remains on chronic anticoagulation managed by his primary care physician.  No role for intervention or surgery as it is not likely to be of any benefit.  He will return as needed    Leotis Pain, MD  03/24/2018 2:30 PM    This note was created with Dragon medical transcription system.  Any errors from dictation are purely unintentional

## 2018-03-24 NOTE — Patient Instructions (Signed)

## 2018-03-24 NOTE — Assessment & Plan Note (Signed)
A long history of multiple thrombotic issues and now has deep venous reflux bilaterally.  Does have saphenous vein reflux on the right 2, but the role for intervention would be limited.  I think using the lymphedema pump more regularly may be of benefit and compression and elevation will need to continue.  Some of his pain is likely neuropathic in nature and he is already on a reasonably high dose of Neurontin and Cymbalta.  He remains on chronic anticoagulation managed by his primary care physician.  No role for intervention or surgery as it is not likely to be of any benefit.  He will return as needed

## 2018-04-27 ENCOUNTER — Other Ambulatory Visit: Payer: Self-pay | Admitting: Orthopedic Surgery

## 2018-04-27 DIAGNOSIS — M25522 Pain in left elbow: Secondary | ICD-10-CM

## 2018-04-28 ENCOUNTER — Ambulatory Visit
Admission: RE | Admit: 2018-04-28 | Discharge: 2018-04-28 | Disposition: A | Discharge status: Home / Self Care | Payer: Medicare Other | Source: Ambulatory Visit | Attending: Orthopedic Surgery | Admitting: Orthopedic Surgery

## 2018-04-28 DIAGNOSIS — M25422 Effusion, left elbow: Secondary | ICD-10-CM | POA: Insufficient documentation

## 2018-04-28 DIAGNOSIS — M25522 Pain in left elbow: Secondary | ICD-10-CM | POA: Diagnosis present

## 2018-04-28 DIAGNOSIS — R600 Localized edema: Secondary | ICD-10-CM | POA: Insufficient documentation

## 2018-04-28 DIAGNOSIS — M7022 Olecranon bursitis, left elbow: Secondary | ICD-10-CM | POA: Insufficient documentation

## 2018-08-14 ENCOUNTER — Other Ambulatory Visit: Payer: Self-pay | Admitting: Internal Medicine

## 2018-08-14 DIAGNOSIS — R51 Headache: Principal | ICD-10-CM

## 2018-08-14 DIAGNOSIS — R519 Headache, unspecified: Secondary | ICD-10-CM

## 2018-08-26 DIAGNOSIS — E538 Deficiency of other specified B group vitamins: Secondary | ICD-10-CM | POA: Insufficient documentation

## 2018-08-26 DIAGNOSIS — G63 Polyneuropathy in diseases classified elsewhere: Secondary | ICD-10-CM | POA: Insufficient documentation

## 2018-08-26 DIAGNOSIS — J449 Chronic obstructive pulmonary disease, unspecified: Secondary | ICD-10-CM | POA: Insufficient documentation

## 2018-08-28 ENCOUNTER — Ambulatory Visit
Admission: RE | Admit: 2018-08-28 | Discharge: 2018-08-28 | Disposition: A | Discharge status: Home / Self Care | Payer: Medicare Other | Source: Ambulatory Visit | Attending: Internal Medicine | Admitting: Internal Medicine

## 2018-08-28 DIAGNOSIS — R51 Headache: Secondary | ICD-10-CM | POA: Insufficient documentation

## 2018-08-28 DIAGNOSIS — R519 Headache, unspecified: Secondary | ICD-10-CM

## 2018-09-03 ENCOUNTER — Ambulatory Visit: Payer: Medicare Other

## 2018-10-04 DIAGNOSIS — E039 Hypothyroidism, unspecified: Secondary | ICD-10-CM | POA: Insufficient documentation

## 2019-03-25 ENCOUNTER — Other Ambulatory Visit: Payer: Self-pay | Admitting: Otolaryngology

## 2019-03-25 DIAGNOSIS — R221 Localized swelling, mass and lump, neck: Secondary | ICD-10-CM

## 2019-04-01 ENCOUNTER — Ambulatory Visit: Payer: Medicare Other

## 2019-04-26 ENCOUNTER — Other Ambulatory Visit: Payer: Self-pay

## 2019-04-26 ENCOUNTER — Ambulatory Visit
Admission: RE | Admit: 2019-04-26 | Discharge: 2019-04-26 | Disposition: A | Discharge status: Home / Self Care | Payer: Medicare Other | Source: Ambulatory Visit | Attending: Otolaryngology | Admitting: Otolaryngology

## 2019-04-26 DIAGNOSIS — R221 Localized swelling, mass and lump, neck: Secondary | ICD-10-CM | POA: Insufficient documentation

## 2019-04-26 HISTORY — DX: Malignant (primary) neoplasm, unspecified: C80.1

## 2019-04-26 LAB — POCT I-STAT CREATININE: Creatinine, Ser: 0.9 mg/dL (ref 0.61–1.24)

## 2019-04-26 MED ORDER — IOHEXOL 300 MG/ML  SOLN
75.0000 mL | Freq: Once | INTRAMUSCULAR | Status: AC | PRN
  Administered 2019-04-26: 13:00:00 75 mL via INTRAVENOUS

## 2019-07-07 ENCOUNTER — Telehealth (INDEPENDENT_AMBULATORY_CARE_PROVIDER_SITE_OTHER): Payer: Self-pay | Admitting: Vascular Surgery

## 2019-07-07 NOTE — Telephone Encounter (Signed)
Patient will need to come in and be seen by provider or app please. Patient stated he is have u/s on his legs with Dr. Caryl Comes so he will need an appt after that date.

## 2019-08-05 DIAGNOSIS — I5022 Chronic systolic (congestive) heart failure: Secondary | ICD-10-CM | POA: Insufficient documentation

## 2019-08-26 ENCOUNTER — Other Ambulatory Visit: Payer: Self-pay | Admitting: Internal Medicine

## 2019-08-26 ENCOUNTER — Other Ambulatory Visit: Payer: Self-pay

## 2019-08-26 ENCOUNTER — Ambulatory Visit
Admission: RE | Admit: 2019-08-26 | Discharge: 2019-08-26 | Disposition: A | Discharge status: Home / Self Care | Payer: Medicare Other | Source: Ambulatory Visit | Attending: Internal Medicine | Admitting: Internal Medicine

## 2019-08-26 DIAGNOSIS — S0990XA Unspecified injury of head, initial encounter: Secondary | ICD-10-CM | POA: Insufficient documentation

## 2019-08-26 DIAGNOSIS — R519 Headache, unspecified: Secondary | ICD-10-CM | POA: Diagnosis not present

## 2019-08-26 DIAGNOSIS — M542 Cervicalgia: Secondary | ICD-10-CM | POA: Insufficient documentation

## 2019-08-26 DIAGNOSIS — W19XXXA Unspecified fall, initial encounter: Secondary | ICD-10-CM | POA: Diagnosis not present

## 2019-08-26 DIAGNOSIS — R234 Changes in skin texture: Secondary | ICD-10-CM | POA: Insufficient documentation

## 2019-08-26 DIAGNOSIS — Y92009 Unspecified place in unspecified non-institutional (private) residence as the place of occurrence of the external cause: Secondary | ICD-10-CM | POA: Insufficient documentation

## 2019-09-19 ENCOUNTER — Ambulatory Visit: Payer: Medicare Other | Attending: Internal Medicine

## 2019-09-19 DIAGNOSIS — Z23 Encounter for immunization: Secondary | ICD-10-CM | POA: Insufficient documentation

## 2019-09-19 NOTE — Progress Notes (Signed)
   Covid-19 Vaccination Clinic  Name:  [name]    MRN: [id number] DOB: [birth year]  09/19/2019  [name] was observed post Covid-19 immunization for 15 minutes without incidence. He was provided with Vaccine Information Sheet and instruction to access the V-Safe system.   [name] was instructed to call 911 with any severe reactions post vaccine: Marland Kitchen Difficulty breathing  . Swelling of your face and throat  . A fast heartbeat  . A bad rash all over your body  . Dizziness and weakness    Immunizations Administered    Name Date Dose VIS Date Route   Pfizer COVID-19 Vaccine 09/19/2019  1:15 PM 0.3 mL 07/02/2019 Intramuscular   Manufacturer: [place]   Lot: [id number]   [place]: [id number]

## 2019-10-12 ENCOUNTER — Ambulatory Visit: Payer: Medicare Other | Attending: Internal Medicine

## 2019-10-12 DIAGNOSIS — Z23 Encounter for immunization: Secondary | ICD-10-CM

## 2019-10-12 NOTE — Progress Notes (Signed)
   Covid-19 Vaccination Clinic  Name:  LEMICHAEL KONIG    MRN: [id number] DOB: 12-24-1945  10/12/2019  Mr. Rinne was observed post Covid-19 immunization for 15 minutes without incident. He was provided with Vaccine Information Sheet and instruction to access the V-Safe system.   Mr. Jemison was instructed to call 911 with any severe reactions post vaccine: Marland Kitchen Difficulty breathing  . Swelling of face and throat  . A fast heartbeat  . A bad rash all over body  . Dizziness and weakness   Immunizations Administered    Name Date Dose VIS Date Route   Pfizer COVID-19 Vaccine 10/12/2019  2:08 PM 0.3 mL 07/02/2019 Intramuscular   Manufacturer: [place]   Lot: [id number]   [place]: [id number]

## 2020-04-04 ENCOUNTER — Telehealth (INDEPENDENT_AMBULATORY_CARE_PROVIDER_SITE_OTHER): Payer: Self-pay | Admitting: Vascular Surgery

## 2020-04-04 NOTE — Telephone Encounter (Signed)
I called the pt back and left a VM on his cell phone asking for more information on why he is needing to be seen.

## 2020-04-04 NOTE — Telephone Encounter (Signed)
Patient called stating that he went to visit Dr. Caryl Comes and he advised him to come in to be seen by JD. Patient was last seen 03-24-2018 with deep venous reflux (bilaterally). Patient would like to come in to be seen. Please advise.

## 2020-04-05 NOTE — Telephone Encounter (Signed)
I called and left a VM asking for more information as to why the pt needs to come in and be seen.

## 2020-04-10 ENCOUNTER — Other Ambulatory Visit (INDEPENDENT_AMBULATORY_CARE_PROVIDER_SITE_OTHER): Payer: Self-pay | Admitting: Nurse Practitioner

## 2020-04-10 DIAGNOSIS — I872 Venous insufficiency (chronic) (peripheral): Secondary | ICD-10-CM

## 2020-04-10 DIAGNOSIS — M79604 Pain in right leg: Secondary | ICD-10-CM

## 2020-04-10 NOTE — Telephone Encounter (Signed)
Please schedule per the NP.

## 2020-04-10 NOTE — Telephone Encounter (Signed)
I return the pts call form leaving a message on the nurses line. The  Pt says that he [place] having pain pain in the veins in his right leg that started months ago. The pt does have a Hx of blood clots the right leg is not hot to the touch and he has no wounds on the right leg. The pt is having swelling in the right leg and a reddish purple change in color in the area . The Pt is also having numbness and difficulty moving the right leg. The pt last saw Dr. Lucky Cowboy on 03/2018 for venous insufficiency he was told to use his lymph pump that he had not used in a year to begin using it a again and compression and elevation and return as needed. The pt prefers to see Dr. Lucky Cowboy. Please advise.

## 2020-04-10 NOTE — Telephone Encounter (Signed)
Please schedule him for a RLE venous reflux study with Dr. Lucky Cowboy per the patient's preference.

## 2020-04-11 ENCOUNTER — Encounter (INDEPENDENT_AMBULATORY_CARE_PROVIDER_SITE_OTHER): Payer: Medicare Other

## 2020-04-11 ENCOUNTER — Ambulatory Visit (INDEPENDENT_AMBULATORY_CARE_PROVIDER_SITE_OTHER): Payer: Medicare Other | Admitting: Vascular Surgery

## 2020-06-13 ENCOUNTER — Ambulatory Visit (INDEPENDENT_AMBULATORY_CARE_PROVIDER_SITE_OTHER): Payer: Medicare Other

## 2020-06-13 ENCOUNTER — Ambulatory Visit (INDEPENDENT_AMBULATORY_CARE_PROVIDER_SITE_OTHER): Payer: Medicare Other | Admitting: Vascular Surgery

## 2020-06-13 ENCOUNTER — Encounter (INDEPENDENT_AMBULATORY_CARE_PROVIDER_SITE_OTHER): Payer: Self-pay

## 2020-06-13 ENCOUNTER — Other Ambulatory Visit: Payer: Self-pay

## 2020-07-25 ENCOUNTER — Encounter (INDEPENDENT_AMBULATORY_CARE_PROVIDER_SITE_OTHER): Payer: Medicare Other

## 2020-07-25 ENCOUNTER — Ambulatory Visit (INDEPENDENT_AMBULATORY_CARE_PROVIDER_SITE_OTHER): Payer: Medicare Other | Admitting: Vascular Surgery

## 2020-09-08 ENCOUNTER — Encounter: Payer: Self-pay | Admitting: Emergency Medicine

## 2020-09-08 ENCOUNTER — Other Ambulatory Visit: Payer: Self-pay

## 2020-09-08 ENCOUNTER — Emergency Department
Admission: EM | Admit: 2020-09-08 | Discharge: 2020-09-09 | Disposition: A | Discharge status: Home / Self Care | Payer: Medicare Other | Attending: Student in an Organized Health Care Education/Training Program | Admitting: Student in an Organized Health Care Education/Training Program

## 2020-09-08 DIAGNOSIS — G8929 Other chronic pain: Secondary | ICD-10-CM | POA: Insufficient documentation

## 2020-09-08 DIAGNOSIS — Z7901 Long term (current) use of anticoagulants: Secondary | ICD-10-CM | POA: Diagnosis not present

## 2020-09-08 DIAGNOSIS — Z859 Personal history of malignant neoplasm, unspecified: Secondary | ICD-10-CM | POA: Diagnosis not present

## 2020-09-08 DIAGNOSIS — J449 Chronic obstructive pulmonary disease, unspecified: Secondary | ICD-10-CM | POA: Insufficient documentation

## 2020-09-08 DIAGNOSIS — M549 Dorsalgia, unspecified: Secondary | ICD-10-CM | POA: Diagnosis present

## 2020-09-08 DIAGNOSIS — I2510 Atherosclerotic heart disease of native coronary artery without angina pectoris: Secondary | ICD-10-CM | POA: Diagnosis not present

## 2020-09-08 DIAGNOSIS — I509 Heart failure, unspecified: Secondary | ICD-10-CM | POA: Diagnosis not present

## 2020-09-08 DIAGNOSIS — J45909 Unspecified asthma, uncomplicated: Secondary | ICD-10-CM | POA: Insufficient documentation

## 2020-09-08 DIAGNOSIS — E1140 Type 2 diabetes mellitus with diabetic neuropathy, unspecified: Secondary | ICD-10-CM | POA: Diagnosis not present

## 2020-09-08 DIAGNOSIS — I110 Hypertensive heart disease with heart failure: Secondary | ICD-10-CM | POA: Insufficient documentation

## 2020-09-08 DIAGNOSIS — F17200 Nicotine dependence, unspecified, uncomplicated: Secondary | ICD-10-CM | POA: Insufficient documentation

## 2020-09-08 DIAGNOSIS — R112 Nausea with vomiting, unspecified: Secondary | ICD-10-CM | POA: Insufficient documentation

## 2020-09-08 DIAGNOSIS — Z79899 Other long term (current) drug therapy: Secondary | ICD-10-CM | POA: Insufficient documentation

## 2020-09-08 DIAGNOSIS — Z9861 Coronary angioplasty status: Secondary | ICD-10-CM | POA: Insufficient documentation

## 2020-09-08 DIAGNOSIS — M545 Low back pain, unspecified: Secondary | ICD-10-CM | POA: Insufficient documentation

## 2020-09-08 LAB — CBC WITH DIFFERENTIAL/PLATELET
Abs Immature Granulocytes: 0.04 10*3/uL (ref 0.00–0.07)
Basophils Absolute: 0.1 10*3/uL (ref 0.0–0.1)
Basophils Relative: 1 %
Eosinophils Absolute: 0.1 10*3/uL (ref 0.0–0.5)
Eosinophils Relative: 1 %
HCT: 46.3 % (ref 39.0–52.0)
Hemoglobin: 15.2 g/dL (ref 13.0–17.0)
Immature Granulocytes: 0 %
Lymphocytes Relative: 27 %
Lymphs Abs: 3.1 10*3/uL (ref 0.7–4.0)
MCH: 28.8 pg (ref 26.0–34.0)
MCHC: 32.8 g/dL (ref 30.0–36.0)
MCV: 87.7 fL (ref 80.0–100.0)
Monocytes Absolute: 0.6 10*3/uL (ref 0.1–1.0)
Monocytes Relative: 5 %
Neutro Abs: 7.6 10*3/uL (ref 1.7–7.7)
Neutrophils Relative %: 66 %
Platelets: 234 10*3/uL (ref 150–400)
RBC: 5.28 MIL/uL (ref 4.22–5.81)
RDW: 13.8 % (ref 11.5–15.5)
WBC: 11.4 10*3/uL — ABNORMAL HIGH (ref 4.0–10.5)
nRBC: 0 % (ref 0.0–0.2)

## 2020-09-08 LAB — BASIC METABOLIC PANEL
Anion gap: 11 (ref 5–15)
BUN: 11 mg/dL (ref 8–23)
CO2: 25 mmol/L (ref 22–32)
Calcium: 9.2 mg/dL (ref 8.9–10.3)
Chloride: 101 mmol/L (ref 98–111)
Creatinine, Ser: 0.82 mg/dL (ref 0.61–1.24)
GFR, Estimated: >60 mL/min (ref >60)
Glucose, Bld: 105 mg/dL — ABNORMAL HIGH (ref 70–99)
Potassium: 4.3 mmol/L (ref 3.5–5.1)
Sodium: 137 mmol/L (ref 135–145)

## 2020-09-08 MED ORDER — HYDROMORPHONE HCL 1 MG/ML IJ SOLN
1.0000 mg | INTRAMUSCULAR | Status: DC | PRN
  Administered 2020-09-08: 1 mg via INTRAVENOUS
  Filled 2020-09-08: qty 1

## 2020-09-08 MED ORDER — ONDANSETRON HCL 4 MG/2ML IJ SOLN
4.0000 mg | Freq: Once | INTRAMUSCULAR | Status: AC
  Administered 2020-09-08: 4 mg via INTRAVENOUS
  Filled 2020-09-08: qty 2

## 2020-09-08 MED ORDER — HYDROMORPHONE HCL 1 MG/ML IJ SOLN
1.0000 mg | INTRAMUSCULAR | Status: AC | PRN
  Administered 2020-09-08 – 2020-09-09 (×2): 1 mg via INTRAVENOUS
  Filled 2020-09-08 (×2): qty 1

## 2020-09-08 MED ORDER — CLONIDINE HCL 0.1 MG PO TABS
0.2000 mg | ORAL_TABLET | Freq: Once | ORAL | Status: AC
  Administered 2020-09-08: 0.2 mg via ORAL
  Filled 2020-09-08: qty 2

## 2020-09-08 MED ORDER — MORPHINE SULFATE ER 30 MG PO TBCR
60.0000 mg | EXTENDED_RELEASE_TABLET | Freq: Two times a day (BID) | ORAL | Status: DC
  Administered 2020-09-08: 60 mg via ORAL
  Filled 2020-09-08: qty 2

## 2020-09-08 NOTE — ED Notes (Signed)
This nurse pulled from another patient room by staff member to check on this patient - per staff pt observed "stumbling" in room requesting something to "fan with" -- this nurse entered room to find pt sitting at edge of bed; attempted to encourage pt to lie down for safety however pt refusing and stating "it hurts too bad" -- pt went on to explain that intrathecal pump completely shut off this week and oral meds have not been controlling his pain -- pt reports chronic back and leg pain x24years.  Pt also reports insomnia due to withdrawal and reports feeling nauseated in room -- pt provided emesis bag and began dry heaving.  This nurse has turned thermostat down and provided paper for pt to fan with.  Pt now awaits provider eval.

## 2020-09-08 NOTE — ED Triage Notes (Signed)
Pt states here for chronic back pain, pt states has intrathecal pump, yesterday had saline placed in pump due to being weaned off of pain medication in pump. Pt states has been gradually being weaned off of pain medication via intrathecal catheter. Pt states today took 0.1mg  Clonidine at 0930, and 10mg  PO oxymorphone at 10am. Pt states contacted pain management and pain management wanted patient to increase Clonidine 0.2mg  and oxymorphone Q6 hrs, pt states last dose 2 hrs ago.

## 2020-09-08 NOTE — ED Provider Notes (Signed)
[facility] Emergency Department Provider Note    Event Date/Time   First MD Initiated Contact with Patient 09/08/20 2049     (approximate)  I have reviewed the triage vital signs and the nursing notes.   HISTORY  Chief Complaint Back Pain and Withdrawal    HPI [name] is a 75 y.o. male extensive past medical history as listed below chronic pain syndrome with intrathecal pump is currently being weaned off his medications by his pain clinic presents to the ER for severe back pain.  Consistent with his chronic back pain.  He thinks that he is going into withdrawal as this intrathecal catheter was switched to saline yesterday.  I called his pain clinic and they increased his oxymorphone and clonidine but he still having intractable pain.  No fevers.  Is having some nausea and vomiting.    Past Medical History:  Diagnosis Date  . Anxiety   . Arthritis    INFLAMMATORY POLYARTHROPATHY  . Asthma   . Cancer ([place])   . CHF (congestive heart failure) ([place])   . COPD (chronic obstructive pulmonary disease) ([place])   . Coronary artery disease    2000  . Depression   . Diabetes mellitus without complication ([place])   . DVT of lower extremity, bilateral ([place])   . Dysrhythmia    afib  . Hypertension   . Neuropathy   . Pain    chronic syndrome  . Peripheral vascular disease ([place])   . S/P IVC filter    removed  . Sleep apnea    o2  2.5 l hs  . Sleep disorder   . Stasis dermatitis of both legs    Family History  Problem Relation Age of Onset  . Heart disease Mother   . Emphysema Father   . Heart attack Father   . Emphysema Sister   . Heart disease Brother    Past Surgical History:  Procedure Laterality Date  . BACK SURGERY     lumbar fusion  . BLADDER TUMOR EXCISION    . CERVICAL FUSION    . COLON SURGERY     bowel blockage  . COLONOSCOPY WITH PROPOFOL N/A 02/27/2018   Procedure: COLONOSCOPY WITH PROPOFOL;  Surgeon: Manya Silvas, MD;   Location[facility] ENDOSCOPY;  Service: Endoscopy;  Laterality: N/A;  . CORONARY ANGIOPLASTY     2000  . INFUSION PUMP REPLACEMENT    . INTRATHECAL PUMP IMPLANTATION    . MASTOIDECTOMY     x 3  . NISSEN FUNDOPLICATION    . 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   Procedure: TRANSURETHRAL RESECTION OF BLADDER TUMOR (TURBT);  Surgeon: Royston Cowper, MD;  Location: [facility] ORS;  Service: Urology;  Laterality: N/A;   Patient Active Problem List   Diagnosis Date Noted  . Chronic venous insufficiency 03/24/2018  . Essential hypertension, benign 01/24/2017  . Diabetes ([place]) 01/24/2017  . Stasis dermatitis of both legs 01/24/2017  . Swelling of limb 01/24/2017  . Pain in limb 01/24/2017      Prior to Admission medications   Medication Sig Start Date End Date Taking? Authorizing Provider  amitriptyline (ELAVIL) 100 MG tablet Take 100 mg by mouth at bedtime.    [provider]  atenolol (TENORMIN) 25 MG tablet Take by mouth daily.    [provider]  bisacodyl (DULCOLAX) 5 MG EC tablet Take 5 mg by mouth daily as needed for moderate constipation.    [provider]  Bupivacaine HCl (BUPIVACAINE ON-Q PAIN PUMP, FOR ORDER SET NO CHG,) by Other route once. Intrathecal pain pump    [provider]  diazepam (VALIUM) 5 MG tablet Take 5 mg by mouth every 8 (eight) hours as needed for anxiety.    [provider]  DULoxetine (CYMBALTA) 60 MG capsule Take by mouth.    [provider]  fentaNYL (DURAGESIC) 25 MCG/HR Place 1 patch onto the skin every 3 (three) days. 08/04/20   [provider]  fexofenadine (ALLEGRA) 180 MG tablet Take 180 mg by mouth daily.    [provider]  gabapentin (NEURONTIN) 600 MG tablet Take 600 mg by mouth 4 (four) times daily.    [provider]  hydrOXYzine (ATARAX/VISTARIL) 10 MG tablet Take 10 mg by mouth 3 (three) times daily as needed.    [provider]  levothyroxine  (SYNTHROID, LEVOTHROID) 75 MCG tablet Take 75 mcg by mouth daily before breakfast.    [provider]  magnesium oxide (MAG-OX) 400 MG tablet Take 400 mg by mouth daily.    [provider]  Methen-Hyosc-Meth Blue-Na Phos (UROGESIC-BLUE) 81.6 MG TABS Take 1 tablet (81.6 mg total) by mouth every 6 (six) hours as needed (dysuria). Patient not taking: Reported on 02/27/2018 03/07/15   Royston Cowper, MD  Multiple Vitamin (MULTIVITAMIN) capsule Take 1 capsule by mouth daily.    [provider]  naloxone [facility]) nasal spray 4 mg/0.1 mL Place into the nose. 02/05/18   [provider]  Omega-3 Fatty Acids (FISH OIL) 1200 MG CAPS Take 1 capsule by mouth daily.    [provider]  oxymorphone (OPANA) 10 MG tablet Take by mouth. 01/21/17   [provider]  promethazine (PHENERGAN) 12.5 MG tablet Take 12.5 mg by mouth every 6 (six) hours as needed for nausea or vomiting.    [provider]  simvastatin (ZOCOR) 40 MG tablet Take 40 mg by mouth daily.    [provider]  tiZANidine (ZANAFLEX) 4 MG capsule Take 4 mg by mouth at bedtime as needed for muscle spasms. 1 to 2 tab    [provider]  warfarin (COUMADIN) 6 MG tablet Take 6 mg by mouth daily.     [provider]    Allergies Codeine, Levofloxacin, Morphine and related, Oxycontin [oxycodone hcl], and Penicillins    Social History Social History   Tobacco Use  . Smoking status: Current Every Day Smoker    Packs/day: 1.00  . Smokeless tobacco: Never Used  Vaping Use  . Vaping Use: Never used  Substance Use Topics  . Alcohol use: No  . Drug use: No    Review of Systems Patient denies headaches, rhinorrhea, blurry vision, numbness, shortness of breath, chest pain, edema, cough, abdominal pain, nausea, vomiting, diarrhea, dysuria, fevers, rashes or hallucinations unless otherwise stated above in HPI. ____________________________________________   PHYSICAL  EXAM:  VITAL SIGNS: Vitals:   09/08/20 2109 09/08/20 2215  BP: (!) 142/85 (!) 119/55  Pulse: 64 60  Resp: 16 14  Temp:    SpO2: 95% 95%    Constitutional: Alert and oriented, uncomfortable appearing Eyes: Conjunctivae are normal.  Head: Atraumatic. Nose: No congestion/rhinnorhea. Mouth/Throat: Mucous membranes are moist.   Neck: No stridor. Painless ROM.  Cardiovascular: Normal rate, regular rhythm. Grossly normal heart sounds.  Good peripheral circulation. Respiratory: Normal respiratory effort.  No retractions. Lungs CTAB. Gastrointestinal: Soft and nontender. No distention. No abdominal bruits. No CVA tenderness. Genitourinary:  Musculoskeletal: No lower extremity  tenderness nor edema.  No joint effusions. Neurologic:  Normal speech and language. No gross focal neurologic deficits are appreciated. No facial droop Skin:  Skin is warm, dry and intact. No rash noted. Psychiatric: Mood and affect are normal. Speech and behavior are normal.  ____________________________________________   LABS (all labs ordered are listed, but only abnormal results are displayed)  Results for orders placed or performed during the hospital encounter of 09/08/20 (from the past 24 hour(s))  CBC with Differential     Status: Abnormal   Collection Time: 09/08/20  9:43 PM  Result Value Ref Range   WBC 11.4 (H) 4.0 - 10.5 K/uL   RBC 5.28 4.22 - 5.81 MIL/uL   Hemoglobin 15.2 13.0 - 17.0 g/dL   HCT 46.3 39.0 - 52.0 %   MCV 87.7 80.0 - 100.0 fL   MCH 28.8 26.0 - 34.0 pg   MCHC 32.8 30.0 - 36.0 g/dL   RDW 13.8 11.5 - 15.5 %   Platelets 234 150 - 400 K/uL   nRBC 0.0 0.0 - 0.2 %   Neutrophils Relative % 66 %   Neutro Abs 7.6 1.7 - 7.7 K/uL   Lymphocytes Relative 27 %   Lymphs Abs 3.1 0.7 - 4.0 K/uL   Monocytes Relative 5 %   Monocytes Absolute 0.6 0.1 - 1.0 K/uL   Eosinophils Relative 1 %   Eosinophils Absolute 0.1 0.0 - 0.5 K/uL   Basophils Relative 1 %   Basophils Absolute 0.1 0.0 - 0.1 K/uL    Immature Granulocytes 0 %   Abs Immature Granulocytes 0.04 0.00 - 0.07 K/uL  Basic metabolic panel     Status: Abnormal   Collection Time: 09/08/20  9:43 PM  Result Value Ref Range   Sodium 137 135 - 145 mmol/L   Potassium 4.3 3.5 - 5.1 mmol/L   Chloride 101 98 - 111 mmol/L   CO2 25 22 - 32 mmol/L   Glucose, Bld 105 (H) 70 - 99 mg/dL   BUN 11 8 - 23 mg/dL   Creatinine, Ser 0.82 0.61 - 1.24 mg/dL   Calcium 9.2 8.9 - 10.3 mg/dL   GFR, Estimated >60 >60 mL/min   Anion gap 11 5 - 15   ____________________________________________ ____________________________________________  RADIOLOGY   ____________________________________________   PROCEDURES  Procedure(s) performed:  Procedures    Critical Care performed: no ____________________________________________   INITIAL IMPRESSION / ASSESSMENT AND PLAN / ED COURSE  Pertinent labs & imaging results that were available during my care of the patient were reviewed by me and considered in my medical decision making (see chart for details).   DDX: Withdrawal, chronic pain syndrome, medication effect, electrolyte abnormality  TARYLL REICHENBERGER is a 75 y.o. who presents to the ED with presentation as described above.  Patient having severe pain after being weaned off his intrathecal pump.  Suspect withdrawal symptoms and acute pain exacerbation.  Nontoxic.  Mildly hypertensive not febrile.  Abdominal exam benign.  Not consistent with infectious process.  Do not feel CT imaging clinically indicated.  Clinical Course as of 09/08/20 2318  Fri Sep 08, 2020  2204 Patient reassessed.  Feels improvement but still having severe pain.  Will order additional IV Dilaudid.  Informed patient if he is requiring multiple doses of IV Dilaudid may need admission to hospital for pain control. [PR]  2316 Patient feeling improved stating that he is down from a 10 out of 10 now to a 7-1/2 out of 10 in pain.  Would like  to be observed for repeat dose and then  decide whether he feels comfortable and his pain is controlled enough to go home or whether he will need to be admitted to the hospital.  Patient will be signed out to oncoming physician pending reassessment. [PR]    Clinical Course User Index [PR] Merlyn Lot, MD    The patient was evaluated in Emergency Department today for the symptoms described in the history of present illness. He/she was evaluated in the context of the global COVID-19 pandemic, which necessitated consideration that the patient might be at risk for infection with the SARS-CoV-2 virus that causes COVID-19. Institutional protocols and algorithms that pertain to the evaluation of patients at risk for COVID-19 are in a state of rapid change based on information released by regulatory bodies including the CDC and federal and state organizations. These policies and algorithms were followed during the patient's care in the ED.  As part of my medical decision making, I reviewed the following data within the [facility] notes reviewed and incorporated, Labs reviewed, notes from prior ED visits and [place] Controlled Substance Database   ____________________________________________   FINAL CLINICAL IMPRESSION(S) / ED DIAGNOSES  Final diagnoses:  Chronic midline low back pain, unspecified whether sciatica present      NEW MEDICATIONS STARTED DURING THIS VISIT:  New Prescriptions   No medications on file     Note:  This document was prepared using Dragon voice recognition software and may include unintentional dictation errors.    Merlyn Lot, MD 09/08/20 [phone]

## 2020-09-08 NOTE — ED Notes (Signed)
Late entry -- Pt placed on continuous pulse ox and cardiac monitoring with frequent vital sign checks.  Pt is now lying in bed in semi-fowlers position.  More relaxed at this time.  Denies any additional needs, questions, concerns.

## 2020-09-08 NOTE — ED Notes (Signed)
This RN spoke with Dr. Cheri Fowler regarding care, per Dr. Cheri Fowler, no blood work needed at this time.

## 2020-09-09 NOTE — ED Provider Notes (Signed)
Symptoms well controlled patient requesting to be discharged.  Will discharge to the care of his wife per plan left by Dr. Kelby Fam, [place], MD 09/09/20 [phone]

## 2020-09-09 NOTE — ED Notes (Signed)
Pt reports pain is tolerable and states he is now sleepy and would like to be discharged; Dr Alfred Levins made aware; plan to discharge at least 1 hr from last dose of Dilaudid (last administered 0001hrs)

## 2020-09-09 NOTE — ED Notes (Signed)
Pt agreeable with d/c plan as discussed by provider- this nurse has verbally reinforced d/c instructions and provided pt with written copy - pt acknowledges verbal understanding and denies any additional questions, concerns, needs -- escorted to vehicle (wife driving) via w/c by this nurse

## 2020-09-12 ENCOUNTER — Encounter (INDEPENDENT_AMBULATORY_CARE_PROVIDER_SITE_OTHER): Payer: Medicare Other

## 2020-09-12 ENCOUNTER — Ambulatory Visit (INDEPENDENT_AMBULATORY_CARE_PROVIDER_SITE_OTHER): Payer: Medicare Other | Admitting: Vascular Surgery

## 2020-10-04 ENCOUNTER — Emergency Department
Admission: EM | Admit: 2020-10-04 | Discharge: 2020-10-04 | Disposition: A | Discharge status: Home / Self Care | Payer: Medicare Other | Attending: Emergency Medicine | Admitting: Emergency Medicine

## 2020-10-04 ENCOUNTER — Other Ambulatory Visit: Payer: Self-pay

## 2020-10-04 ENCOUNTER — Emergency Department: Payer: Medicare Other

## 2020-10-04 DIAGNOSIS — R55 Syncope and collapse: Secondary | ICD-10-CM | POA: Diagnosis not present

## 2020-10-04 DIAGNOSIS — Z859 Personal history of malignant neoplasm, unspecified: Secondary | ICD-10-CM | POA: Insufficient documentation

## 2020-10-04 DIAGNOSIS — I110 Hypertensive heart disease with heart failure: Secondary | ICD-10-CM | POA: Diagnosis not present

## 2020-10-04 DIAGNOSIS — J449 Chronic obstructive pulmonary disease, unspecified: Secondary | ICD-10-CM | POA: Insufficient documentation

## 2020-10-04 DIAGNOSIS — Z7901 Long term (current) use of anticoagulants: Secondary | ICD-10-CM | POA: Insufficient documentation

## 2020-10-04 DIAGNOSIS — E039 Hypothyroidism, unspecified: Secondary | ICD-10-CM | POA: Insufficient documentation

## 2020-10-04 DIAGNOSIS — S0181XA Laceration without foreign body of other part of head, initial encounter: Secondary | ICD-10-CM | POA: Diagnosis not present

## 2020-10-04 DIAGNOSIS — J45909 Unspecified asthma, uncomplicated: Secondary | ICD-10-CM | POA: Insufficient documentation

## 2020-10-04 DIAGNOSIS — Z79899 Other long term (current) drug therapy: Secondary | ICD-10-CM | POA: Diagnosis not present

## 2020-10-04 DIAGNOSIS — I2510 Atherosclerotic heart disease of native coronary artery without angina pectoris: Secondary | ICD-10-CM | POA: Diagnosis not present

## 2020-10-04 DIAGNOSIS — I509 Heart failure, unspecified: Secondary | ICD-10-CM | POA: Diagnosis not present

## 2020-10-04 DIAGNOSIS — W01198A Fall on same level from slipping, tripping and stumbling with subsequent striking against other object, initial encounter: Secondary | ICD-10-CM | POA: Insufficient documentation

## 2020-10-04 DIAGNOSIS — E119 Type 2 diabetes mellitus without complications: Secondary | ICD-10-CM | POA: Diagnosis not present

## 2020-10-04 DIAGNOSIS — S0990XA Unspecified injury of head, initial encounter: Secondary | ICD-10-CM | POA: Diagnosis present

## 2020-10-04 DIAGNOSIS — F17200 Nicotine dependence, unspecified, uncomplicated: Secondary | ICD-10-CM | POA: Insufficient documentation

## 2020-10-04 LAB — CBC
HCT: 45.7 % (ref 39.0–52.0)
Hemoglobin: 14.4 g/dL (ref 13.0–17.0)
MCH: 28.3 pg (ref 26.0–34.0)
MCHC: 31.5 g/dL (ref 30.0–36.0)
MCV: 90 fL (ref 80.0–100.0)
Platelets: 229 10*3/uL (ref 150–400)
RBC: 5.08 MIL/uL (ref 4.22–5.81)
RDW: 14.6 % (ref 11.5–15.5)
WBC: 11.9 10*3/uL — ABNORMAL HIGH (ref 4.0–10.5)
nRBC: 0 % (ref 0.0–0.2)

## 2020-10-04 LAB — URINALYSIS, COMPLETE (UACMP) WITH MICROSCOPIC
Bacteria, UA: NONE SEEN
Bilirubin Urine: NEGATIVE
Glucose, UA: NEGATIVE mg/dL
Hgb urine dipstick: NEGATIVE
Ketones, ur: 5 mg/dL — AB
Leukocytes,Ua: NEGATIVE
Nitrite: NEGATIVE
Protein, ur: NEGATIVE mg/dL
Specific Gravity, Urine: 1.025 (ref 1.005–1.030)
pH: 5 (ref 5.0–8.0)

## 2020-10-04 LAB — BASIC METABOLIC PANEL
Anion gap: 6 (ref 5–15)
BUN: 14 mg/dL (ref 8–23)
CO2: 29 mmol/L (ref 22–32)
Calcium: 8.4 mg/dL — ABNORMAL LOW (ref 8.9–10.3)
Chloride: 106 mmol/L (ref 98–111)
Creatinine, Ser: 1.01 mg/dL (ref 0.61–1.24)
GFR, Estimated: >60 mL/min (ref >60)
Glucose, Bld: 116 mg/dL — ABNORMAL HIGH (ref 70–99)
Potassium: 3.8 mmol/L (ref 3.5–5.1)
Sodium: 141 mmol/L (ref 135–145)

## 2020-10-04 LAB — TROPONIN I (HIGH SENSITIVITY)
Troponin I (High Sensitivity): 10 ng/L (ref <18)
Troponin I (High Sensitivity): 11 ng/L (ref <18)

## 2020-10-04 MED ORDER — PREGABALIN 50 MG PO CAPS
50.0000 mg | ORAL_CAPSULE | Freq: Once | ORAL | Status: AC
  Administered 2020-10-04: 50 mg via ORAL
  Filled 2020-10-04: qty 1

## 2020-10-04 MED ORDER — BACITRACIN ZINC 500 UNIT/GM EX OINT
TOPICAL_OINTMENT | Freq: Once | CUTANEOUS | Status: AC
  Filled 2020-10-04: qty 0.9

## 2020-10-04 MED ORDER — DULOXETINE HCL 60 MG PO CPEP
60.0000 mg | ORAL_CAPSULE | Freq: Once | ORAL | Status: AC
  Administered 2020-10-04: 60 mg via ORAL
  Filled 2020-10-04: qty 1

## 2020-10-04 MED ORDER — ACETAMINOPHEN 500 MG PO TABS
1000.0000 mg | ORAL_TABLET | Freq: Once | ORAL | Status: AC
  Administered 2020-10-04: 1000 mg via ORAL
  Filled 2020-10-04: qty 2

## 2020-10-04 MED ORDER — LIDOCAINE 4 % EX CREA
TOPICAL_CREAM | Freq: Once | CUTANEOUS | Status: AC
  Administered 2020-10-04: 1 via TOPICAL
  Filled 2020-10-04: qty 5

## 2020-10-04 MED ORDER — HYDROMORPHONE HCL 2 MG PO TABS
4.0000 mg | ORAL_TABLET | Freq: Once | ORAL | Status: AC
  Administered 2020-10-04: 4 mg via ORAL
  Filled 2020-10-04: qty 2

## 2020-10-04 NOTE — ED Notes (Signed)
Patient transported to CT with technician on stretcher.

## 2020-10-04 NOTE — ED Notes (Signed)
Dr Tamala Julian at bedside with re-eval and results

## 2020-10-04 NOTE — ED Notes (Signed)
Pt's wife on the phone for an update, states that she is on her way

## 2020-10-04 NOTE — ED Notes (Signed)
Pt back from ct, hands cleaned bilat of blood, pt reports some headache, has a lac above the bridge of his nose on the left forehead at brow, bleeding controlled. Urinal given to pt and asked to provide a urine sample, call bell within reach of pt to call if needed, no distress noted, resp even and unlabored, states that he was started on lyrica last week for pain and to help with withdrawal symptoms from stopping the pain medication in his pain pump that is located in his abd

## 2020-10-04 NOTE — Discharge Instructions (Signed)
In general, keep the area clean and dry. Then apply a Bacitracin/Neosporin type of antibiotic ointment.  Gently wash with warm soap and water once daily, and again if it gets dirty. Don't vigorously scrub at the wound.  Gently pat dry. Once dry, apply Neosporin / bacitracin type antibiotic ointment. It is important to keep the wound moist with an antibiotic ointment, or a small amount of Vaseline, for the first 5 days. Keeping it moist/clean will help the area heal faster and prevent scar tissue formation.  If you are doing anything active, keep it covered.  If you are sitting around at home, you may leave it uncovered.  Use Tylenol for pain and fevers.  Up to 1000 mg per dose, up to 4 times per day.  Do not take more than 4000 mg of Tylenol/acetaminophen within 24 hours..  Follow up with your regular doctor, and other specialists, as scheduled in the next couple weeks.   The stitch in your forehead will need to be removed in about 7-10 days.   If you develop any further episodes of passing out, falls, or seizure-like activity, please return to the ED. If there's puss coming from the wound, spreading red rash, or signs of infection, please return to the ED.

## 2020-10-04 NOTE — ED Provider Notes (Signed)
[facility] Emergency Department Provider Note ____________________________________________   Event Date/Time   First MD Initiated Contact with Patient 10/04/20 [phone]     (approximate)  I have reviewed the triage vital signs and the nursing notes.  HISTORY  Chief Complaint Loss of Consciousness   HPI ABASS MISENER is a 75 y.o. malewho presents to the ED for evaluation of syncope.   Chart review indicates history of chronic pain syndrome taking daily Dilaudid orally, and previously using an intrathecal pump.  HTN, HLD, hypothyroidism.  CAD.  History of DVT and A. fib, but not on anticoagulation due to risk of falls.   Patient lives at home with his wife, ambulatory with a cane or a walker at baseline.  Denies recent illnesses.  Reports the addition of Lyrica to his regimen recently.  Patient presents to the ED for evaluation of syncope and fall with a forehead laceration.  Patient reports that this morning, and yesterday morning, while getting out of bed he had a syncopal episode, falling forward and striking his head.  He reports yesterday morning he caught himself before passing out all the way and struck his bilateral elbows on the hardwood floor, but was able to get back up.  He reports a similar episode occurring this morning while getting out of bed, but syncopized and fully, falling forward and striking his forehead on the hardwood floors causing laceration.  He reports difficulty getting up, and so having his wife and eventually paramedics assist him get up and transfer to the ED.  He reports blowing his nose at home before the medics transferred him, and noting some blood, but has had no continuous epistaxis.  He denies trauma to his back directly, and reports chronic lumbar pain at 6/10 aching intensity, and typical for him.  He has not taken his morning chronic pain medications.  Patient currently reports that he feels "fine" and denies complaints beyond  his chronic lumbar pain.  Past Medical History:  Diagnosis Date  . Anxiety   . Arthritis    INFLAMMATORY POLYARTHROPATHY  . Asthma   . Cancer ([place])   . CHF (congestive heart failure) ([place])   . COPD (chronic obstructive pulmonary disease) ([place])   . Coronary artery disease    2000  . Depression   . Diabetes mellitus without complication ([place])   . DVT of lower extremity, bilateral ([place])   . Dysrhythmia    afib  . Hypertension   . Neuropathy   . Pain    chronic syndrome  . Peripheral vascular disease ([place])   . S/P IVC filter    removed  . Sleep apnea    o2  2.5 l hs  . Sleep disorder   . Stasis dermatitis of both legs     Patient Active Problem List   Diagnosis Date Noted  . Chronic venous insufficiency 03/24/2018  . Essential hypertension, benign 01/24/2017  . Diabetes ([place]) 01/24/2017  . Stasis dermatitis of both legs 01/24/2017  . Swelling of limb 01/24/2017  . Pain in limb 01/24/2017    Past Surgical History:  Procedure Laterality Date  . BACK SURGERY     lumbar fusion  . BLADDER TUMOR EXCISION    . CERVICAL FUSION    . COLON SURGERY     bowel blockage  . COLONOSCOPY WITH PROPOFOL N/A 02/27/2018   Procedure: COLONOSCOPY WITH PROPOFOL;  Surgeon: Manya Silvas, MD;  Location: [facility] ENDOSCOPY;  Service: Endoscopy;  Laterality: N/A;  . CORONARY  ANGIOPLASTY     2000  . INFUSION PUMP REPLACEMENT    . INTRATHECAL PUMP IMPLANTATION    . MASTOIDECTOMY     x 3  . NISSEN FUNDOPLICATION    . 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   Procedure: TRANSURETHRAL RESECTION OF BLADDER TUMOR (TURBT);  Surgeon: Royston Cowper, MD;  Location: [facility] ORS;  Service: Urology;  Laterality: N/A;    Prior to Admission medications   Medication Sig Start Date End Date Taking? Authorizing Provider  amitriptyline (ELAVIL) 100 MG tablet Take 100 mg by mouth at bedtime.    [provider]  atenolol (TENORMIN) 25 MG tablet Take by mouth daily.    [provider]  bisacodyl (DULCOLAX) 5 MG EC tablet Take 5 mg by mouth daily as needed for moderate constipation.    [provider]  Bupivacaine HCl (BUPIVACAINE ON-Q PAIN PUMP, FOR ORDER SET NO CHG,) by Other route once. Intrathecal pain pump    [provider]  diazepam (VALIUM) 5 MG tablet Take 5 mg by mouth every 8 (eight) hours as needed for anxiety.    [provider]  DULoxetine (CYMBALTA) 60 MG capsule Take by mouth.    [provider]  fentaNYL (DURAGESIC) 25 MCG/HR Place 1 patch onto the skin every 3 (three) days. 08/04/20   [provider]  fexofenadine (ALLEGRA) 180 MG tablet Take 180 mg by mouth daily.    [provider]  gabapentin (NEURONTIN) 600 MG tablet Take 600 mg by mouth 4 (four) times daily.    [provider]  hydrOXYzine (ATARAX/VISTARIL) 10 MG tablet Take 10 mg by mouth 3 (three) times daily as needed.    [provider]  levothyroxine (SYNTHROID, LEVOTHROID) 75 MCG tablet Take 75 mcg by mouth daily before breakfast.    [provider]  magnesium oxide (MAG-OX) 400 MG tablet Take 400 mg by mouth daily.    [provider]  Methen-Hyosc-Meth Blue-Na Phos (UROGESIC-BLUE) 81.6 MG TABS Take 1 tablet (81.6 mg total) by mouth every 6 (six) hours as needed (dysuria). Patient not taking: Reported on 02/27/2018 03/07/15   Royston Cowper, MD  Multiple Vitamin (MULTIVITAMIN) capsule Take 1 capsule by mouth daily.    [provider]  naloxone [facility]) nasal spray 4 mg/0.1 mL Place into the nose. 02/05/18   [provider]  Omega-3 Fatty Acids (FISH OIL) 1200 MG CAPS Take 1 capsule by mouth daily.    [provider]  oxymorphone (OPANA) 10 MG tablet Take by mouth. 01/21/17   [provider]  promethazine (PHENERGAN) 12.5 MG tablet Take 12.5 mg by mouth every 6 (six) hours as needed for nausea or vomiting.    [provider]  simvastatin (ZOCOR) 40 MG tablet  Take 40 mg by mouth daily.    [provider]  tiZANidine (ZANAFLEX) 4 MG capsule Take 4 mg by mouth at bedtime as needed for muscle spasms. 1 to 2 tab    [provider]  warfarin (COUMADIN) 6 MG tablet Take 6 mg by mouth daily.     [provider]    Allergies Codeine, Levofloxacin, Morphine and related, Oxycontin [oxycodone hcl], and Penicillins  Family History  Problem Relation Age of Onset  . Heart disease Mother   . Emphysema Father   . Heart attack Father   . Emphysema Sister   . Heart disease Brother     Social History Social History   Tobacco Use  . Smoking  status: Current Every Day Smoker    Packs/day: 1.00  . Smokeless tobacco: Never Used  Vaping Use  . Vaping Use: Never used  Substance Use Topics  . Alcohol use: No  . Drug use: No    Review of Systems  Constitutional: No fever/chills Eyes: No visual changes. ENT: No sore throat. Cardiovascular: Denies chest pain. Respiratory: Denies shortness of breath. Gastrointestinal: No abdominal pain.  No nausea, no vomiting.  No diarrhea.  No constipation. Genitourinary: Negative for dysuria. Musculoskeletal: Positive for chronic back pain. Skin: Negative for rash. Neurological: Negative for focal weakness or numbness.  Positive for syncope, fall and headache.  ____________________________________________   PHYSICAL EXAM:  VITAL SIGNS: Vitals:   10/04/20 0930 10/04/20 1000  BP: (!) 152/60 (!) 144/64  Pulse:    Resp: 14 17  Temp:    SpO2:       Constitutional: Alert and oriented. Well appearing and in no acute distress. Eyes: Conjunctivae are normal. PERRL. EOMI. Head: Slightly bloody to gauze taped to his forehead, no continued bleeding.  I remove this and he has a 1 cm horizontally-oriented hemostatic laceration into the subcutaneous tissue of his forehead just superior to his left eyebrow.  No associated bony step-offs. No signs of EOM entrapment. Abrasion superficially to  the bridge of the nose without nasal septal hematoma. Nose: No congestion/rhinnorhea. Mouth/Throat: Mucous membranes are moist.  Oropharynx non-erythematous. Neck: No stridor. No cervical spine tenderness to palpation. Cardiovascular: Normal rate, regular rhythm. Grossly normal heart sounds.  Good peripheral circulation. Respiratory: Normal respiratory effort.  No retractions. Lungs CTAB. Gastrointestinal: Soft , nondistended, nontender to palpation. No CVA tenderness. Musculoskeletal: No lower extremity tenderness nor edema.  No joint effusions.  Band-Aids to bilateral elbows, with superficial abrasion beneath them.  Full active and passive ROM of bilateral elbows. No deformity to extremities Neurologic:  Normal speech and language. No gross focal neurologic deficits are appreciated.  Cranial nerves II through XII intact 5/5 strength and sensation in all 4 extremities Skin:  Skin is warm, dry and intact. No rash noted. Psychiatric: Mood and affect are normal. Speech and behavior are normal.  ____________________________________________   LABS (all labs ordered are listed, but only abnormal results are displayed)  Labs Reviewed  BASIC METABOLIC PANEL - Abnormal; Notable for the following components:      Result Value   Glucose, Bld 116 (*)    Calcium 8.4 (*)    All other components within normal limits  CBC - Abnormal; Notable for the following components:   WBC 11.9 (*)    All other components within normal limits  URINALYSIS, COMPLETE (UACMP) WITH MICROSCOPIC - Abnormal; Notable for the following components:   Color, Urine YELLOW (*)    APPearance HAZY (*)    Ketones, ur 5 (*)    All other components within normal limits  TROPONIN I (HIGH SENSITIVITY)  TROPONIN I (HIGH SENSITIVITY)   ____________________________________________  12 Lead EKG  Sinus rhythm, rate of 81 bpm.  Normal axis.  Incomplete right bundle.  Couple PVCs.  No evidence of acute  ischemia. ____________________________________________  RADIOLOGY  ED MD interpretation: CT head reviewed by me without evidence of acute ICH  Official radiology report(s): CT HEAD WO CONTRAST  Result Date: 10/04/2020 CLINICAL DATA:  Recurrent syncope. EXAM: CT HEAD WITHOUT CONTRAST TECHNIQUE: Contiguous axial images were obtained from the base of the skull through the vertex without intravenous contrast. COMPARISON:  08/26/2019 FINDINGS: Brain: No evidence of acute infarction, hemorrhage, hydrocephalus, extra-axial collection or  mass lesion/mass effect. Vascular: No hyperdense vessel or unexpected calcification. Skull: Normal. Negative for fracture or focal lesion. Sinuses/Orbits: Right mastoidectomy with clear bulb. Tiny high-density at the left orbit appears to be in the lid on sagittal reformats, unchanged. IMPRESSION: Stable head CT.  Normal appearance of the brain. Electronically Signed   By: Monte Fantasia M.D.   On: 10/04/2020 07:34    ____________________________________________   PROCEDURES and INTERVENTIONS  Procedure(s) performed (including Critical Care):  .1-3 Lead EKG Interpretation Performed by: Vladimir Crofts, MD Authorized by: Vladimir Crofts, MD     Interpretation: normal     ECG rate:  80   ECG rate assessment: normal     Rhythm: sinus rhythm     Ectopy: PVCs     Conduction: normal   Comments:     Frequent PVCs, occasionally in a bigeminy pattern.  Marland Kitchen.Laceration Repair  Date/Time: 10/04/2020 9:04 AM Performed by: Vladimir Crofts, MD Authorized by: Vladimir Crofts, MD   Consent:    Consent obtained:  Verbal   Consent given by:  Patient   Risks, benefits, and alternatives were discussed: yes     Risks discussed:  Infection, pain and poor cosmetic result Anesthesia:    Anesthesia method:  Topical application   Topical anesthetic:  Lidocaine gel Laceration details:    Location:  Scalp   Scalp location:  Frontal   Length (cm):  1 Exploration:    Hemostasis  achieved with:  Direct pressure   Imaging obtained comment:  Ct   Imaging outcome: foreign body not noted     Contaminated: no   Treatment:    Area cleansed with:  Povidone-iodine   Amount of cleaning:  Standard   Irrigation solution:  Sterile saline   Visualized foreign bodies/material removed: no     Debridement:  None   Undermining:  None   Scar revision: no   Skin repair:    Repair method:  Sutures   Suture size:  5-0   Suture material:  Nylon   Suture technique:  Simple interrupted   Number of sutures:  1 Approximation:    Approximation:  Close Repair type:    Repair type:  Simple Post-procedure details:    Dressing:  Antibiotic ointment   Procedure completion:  Tolerated well, no immediate complications    Medications  HYDROmorphone (DILAUDID) tablet 4 mg (4 mg Oral Given 10/04/20 0755)  acetaminophen (TYLENOL) tablet 1,000 mg (1,000 mg Oral Given 10/04/20 0754)  pregabalin (LYRICA) capsule 50 mg (50 mg Oral Given 10/04/20 0755)  DULoxetine (CYMBALTA) DR capsule 60 mg (60 mg Oral Given 10/04/20 0754)  lidocaine (LMX) 4 % cream (1 application Topical Given 10/04/20 0759)  bacitracin ointment ( Topical Given 10/04/20 0948)    ____________________________________________   MDM / ED COURSE   75 year old male presents to the ED after syncope and fall with small forehead laceration, amenable to outpatient management. Mild hypertension, but he has not taken his a.m. meds, and otherwise normal vitals on room air.  Exam with hemostatic laceration to his forehead and a superficial abrasion to the bridge of his nose, otherwise without evidence of significant trauma.  He has no bony step-offs or signs of EOM entrapment of his left eye, he has no neurovascular deficits and no signs of extremity trauma to necessitate imaging.  Blood work is unremarkable.  Urine without infectious features.  CT head demonstrates no evidence of ICH or CVA.  EKG is nonischemic.  Laceration repair, as above,  is uncomplicated with a  single nonabsorbable suture.  I discussed the patient the possibility of medical observation admission, but he declines this and requests discharge home with his wife.  I think this is reasonable at this time, and we discussed following up with his PCP, as well as close return precautions for the ED.  Patient stable for outpatient management.  Clinical Course as of 10/04/20 1544  Wed Oct 04, 2020  0900 Forehead cleaned and suture applied by me, as above.  Wife arrives as I am performing this procedure.  We discussed his reassuring work-up and we discussed observation admission versus discharge with close return precautions.  Patient lists multiple upcoming doctors appointments, including a video visit with his PCP this afternoon.  He requests discharge.  We thoroughly discussed return precautions for the ED. [DS]    Clinical Course User Index [DS] Vladimir Crofts, MD    ____________________________________________   FINAL CLINICAL IMPRESSION(S) / ED DIAGNOSES  Final diagnoses:  Syncope and collapse  Forehead laceration, initial encounter     ED Discharge Orders    None       Lamondre Wesche Tamala Julian   Note:  This document was prepared using Dragon voice recognition software and may include unintentional dictation errors.   Vladimir Crofts, MD 10/04/20 [phone]

## 2020-10-04 NOTE — ED Triage Notes (Addendum)
Syncopal event upon getting out of bed this AM and reports same thing happened yesterday. Denies hx of same and states he doesn't remember anything other than waking up on the floor. Denies chest pain or SOB. Denies daily thinners other than ASA.  Pt alert and oriented on arrival and is in NAD. Cardiac monitor placed.   Pt with abrasions to L forehead above eyebrow as well as bridge of nose. Reports blood in nares when blowing nose yesterday.

## 2020-10-10 ENCOUNTER — Other Ambulatory Visit: Payer: Self-pay | Admitting: Urology

## 2020-10-10 DIAGNOSIS — R310 Gross hematuria: Secondary | ICD-10-CM

## 2020-10-24 ENCOUNTER — Ambulatory Visit: Payer: Medicare Other

## 2020-10-27 ENCOUNTER — Ambulatory Visit: Payer: Medicare Other

## 2020-11-13 ENCOUNTER — Ambulatory Visit: Admission: RE | Admit: 2020-11-13 | Payer: Medicare Other | Source: Ambulatory Visit

## 2020-12-11 ENCOUNTER — Other Ambulatory Visit: Payer: Self-pay

## 2020-12-11 ENCOUNTER — Ambulatory Visit
Admission: RE | Admit: 2020-12-11 | Discharge: 2020-12-11 | Disposition: A | Discharge status: Home / Self Care | Payer: Medicare Other | Source: Ambulatory Visit | Attending: Urology | Admitting: Urology

## 2020-12-11 DIAGNOSIS — R310 Gross hematuria: Secondary | ICD-10-CM | POA: Insufficient documentation

## 2021-01-17 ENCOUNTER — Telehealth: Payer: Self-pay

## 2021-01-17 NOTE — Telephone Encounter (Signed)
Call received to the nurses station, no message sent back. Patient states he has a new patient appointment on July 20th and was out of his medications. He stated that his current pain clinic told him to call us for a refill. I told patient that we could not do this until he is seen here and established as a patient. Sent phone call to front desk to see if they could get him an earlier appointment.

## 2021-02-07 ENCOUNTER — Encounter: Payer: Self-pay | Admitting: Pain Medicine

## 2021-02-07 ENCOUNTER — Ambulatory Visit: Payer: Medicare Other | Attending: Pain Medicine | Admitting: Pain Medicine

## 2021-02-07 ENCOUNTER — Other Ambulatory Visit: Payer: Self-pay

## 2021-02-07 VITALS — BP 139/66 | HR 78 | Temp 97.0°F | Ht 75.0 in | Wt 210.0 lb

## 2021-02-07 DIAGNOSIS — Z122 Encounter for screening for malignant neoplasm of respiratory organs: Secondary | ICD-10-CM | POA: Insufficient documentation

## 2021-02-07 DIAGNOSIS — G894 Chronic pain syndrome: Secondary | ICD-10-CM

## 2021-02-07 DIAGNOSIS — T85610S Breakdown (mechanical) of epidural and subdural infusion catheter, sequela: Secondary | ICD-10-CM | POA: Diagnosis not present

## 2021-02-07 DIAGNOSIS — S93409A Sprain of unspecified ligament of unspecified ankle, initial encounter: Secondary | ICD-10-CM | POA: Insufficient documentation

## 2021-02-07 DIAGNOSIS — M79671 Pain in right foot: Secondary | ICD-10-CM | POA: Diagnosis not present

## 2021-02-07 DIAGNOSIS — M792 Neuralgia and neuritis, unspecified: Secondary | ICD-10-CM | POA: Insufficient documentation

## 2021-02-07 DIAGNOSIS — Z885 Allergy status to narcotic agent status: Secondary | ICD-10-CM | POA: Insufficient documentation

## 2021-02-07 DIAGNOSIS — H01009 Unspecified blepharitis unspecified eye, unspecified eyelid: Secondary | ICD-10-CM | POA: Insufficient documentation

## 2021-02-07 DIAGNOSIS — F17200 Nicotine dependence, unspecified, uncomplicated: Secondary | ICD-10-CM | POA: Insufficient documentation

## 2021-02-07 DIAGNOSIS — R143 Flatulence: Secondary | ICD-10-CM | POA: Insufficient documentation

## 2021-02-07 DIAGNOSIS — F17210 Nicotine dependence, cigarettes, uncomplicated: Secondary | ICD-10-CM | POA: Insufficient documentation

## 2021-02-07 DIAGNOSIS — K589 Irritable bowel syndrome without diarrhea: Secondary | ICD-10-CM | POA: Insufficient documentation

## 2021-02-07 DIAGNOSIS — Z7901 Long term (current) use of anticoagulants: Secondary | ICD-10-CM | POA: Insufficient documentation

## 2021-02-07 DIAGNOSIS — M5441 Lumbago with sciatica, right side: Secondary | ICD-10-CM | POA: Diagnosis not present

## 2021-02-07 DIAGNOSIS — E118 Type 2 diabetes mellitus with unspecified complications: Secondary | ICD-10-CM | POA: Diagnosis not present

## 2021-02-07 DIAGNOSIS — Z462 Encounter for fitting and adjustment of other devices related to nervous system and special senses: Secondary | ICD-10-CM | POA: Diagnosis not present

## 2021-02-07 DIAGNOSIS — T85615A Breakdown (mechanical) of other nervous system device, implant or graft, initial encounter: Secondary | ICD-10-CM | POA: Insufficient documentation

## 2021-02-07 DIAGNOSIS — M25562 Pain in left knee: Secondary | ICD-10-CM | POA: Diagnosis not present

## 2021-02-07 DIAGNOSIS — L723 Sebaceous cyst: Secondary | ICD-10-CM | POA: Insufficient documentation

## 2021-02-07 DIAGNOSIS — M961 Postlaminectomy syndrome, not elsewhere classified: Secondary | ICD-10-CM | POA: Diagnosis not present

## 2021-02-07 DIAGNOSIS — R911 Solitary pulmonary nodule: Secondary | ICD-10-CM | POA: Insufficient documentation

## 2021-02-07 DIAGNOSIS — M79672 Pain in left foot: Secondary | ICD-10-CM | POA: Diagnosis not present

## 2021-02-07 DIAGNOSIS — I2510 Atherosclerotic heart disease of native coronary artery without angina pectoris: Secondary | ICD-10-CM | POA: Diagnosis not present

## 2021-02-07 DIAGNOSIS — Z789 Other specified health status: Secondary | ICD-10-CM

## 2021-02-07 DIAGNOSIS — Z978 Presence of other specified devices: Secondary | ICD-10-CM | POA: Insufficient documentation

## 2021-02-07 DIAGNOSIS — N200 Calculus of kidney: Secondary | ICD-10-CM | POA: Insufficient documentation

## 2021-02-07 DIAGNOSIS — G8929 Other chronic pain: Secondary | ICD-10-CM | POA: Insufficient documentation

## 2021-02-07 DIAGNOSIS — R29898 Other symptoms and signs involving the musculoskeletal system: Secondary | ICD-10-CM | POA: Diagnosis not present

## 2021-02-07 DIAGNOSIS — F419 Anxiety disorder, unspecified: Secondary | ICD-10-CM | POA: Diagnosis not present

## 2021-02-07 DIAGNOSIS — G4762 Sleep related leg cramps: Secondary | ICD-10-CM

## 2021-02-07 DIAGNOSIS — N411 Chronic prostatitis: Secondary | ICD-10-CM | POA: Insufficient documentation

## 2021-02-07 DIAGNOSIS — I5022 Chronic systolic (congestive) heart failure: Secondary | ICD-10-CM | POA: Insufficient documentation

## 2021-02-07 DIAGNOSIS — E039 Hypothyroidism, unspecified: Secondary | ICD-10-CM | POA: Insufficient documentation

## 2021-02-07 DIAGNOSIS — M25561 Pain in right knee: Secondary | ICD-10-CM | POA: Insufficient documentation

## 2021-02-07 DIAGNOSIS — M5442 Lumbago with sciatica, left side: Secondary | ICD-10-CM | POA: Diagnosis not present

## 2021-02-07 DIAGNOSIS — D1800 Hemangioma unspecified site: Secondary | ICD-10-CM | POA: Insufficient documentation

## 2021-02-07 DIAGNOSIS — M79605 Pain in left leg: Secondary | ICD-10-CM

## 2021-02-07 DIAGNOSIS — R319 Hematuria, unspecified: Secondary | ICD-10-CM | POA: Insufficient documentation

## 2021-02-07 DIAGNOSIS — S43429A Sprain of unspecified rotator cuff capsule, initial encounter: Secondary | ICD-10-CM | POA: Insufficient documentation

## 2021-02-07 DIAGNOSIS — M25579 Pain in unspecified ankle and joints of unspecified foot: Secondary | ICD-10-CM | POA: Insufficient documentation

## 2021-02-07 DIAGNOSIS — I110 Hypertensive heart disease with heart failure: Secondary | ICD-10-CM | POA: Diagnosis not present

## 2021-02-07 DIAGNOSIS — J449 Chronic obstructive pulmonary disease, unspecified: Secondary | ICD-10-CM | POA: Diagnosis not present

## 2021-02-07 DIAGNOSIS — R531 Weakness: Secondary | ICD-10-CM | POA: Diagnosis not present

## 2021-02-07 DIAGNOSIS — I480 Paroxysmal atrial fibrillation: Secondary | ICD-10-CM | POA: Diagnosis not present

## 2021-02-07 DIAGNOSIS — F329 Major depressive disorder, single episode, unspecified: Secondary | ICD-10-CM | POA: Insufficient documentation

## 2021-02-07 DIAGNOSIS — M79604 Pain in right leg: Secondary | ICD-10-CM

## 2021-02-07 DIAGNOSIS — G2581 Restless legs syndrome: Secondary | ICD-10-CM | POA: Insufficient documentation

## 2021-02-07 DIAGNOSIS — R141 Gas pain: Secondary | ICD-10-CM | POA: Insufficient documentation

## 2021-02-07 DIAGNOSIS — M5417 Radiculopathy, lumbosacral region: Secondary | ICD-10-CM | POA: Insufficient documentation

## 2021-02-07 DIAGNOSIS — S8000XA Contusion of unspecified knee, initial encounter: Secondary | ICD-10-CM | POA: Insufficient documentation

## 2021-02-07 DIAGNOSIS — T85610A Breakdown (mechanical) of epidural and subdural infusion catheter, initial encounter: Secondary | ICD-10-CM | POA: Insufficient documentation

## 2021-02-07 DIAGNOSIS — E538 Deficiency of other specified B group vitamins: Secondary | ICD-10-CM | POA: Insufficient documentation

## 2021-02-07 DIAGNOSIS — M899 Disorder of bone, unspecified: Secondary | ICD-10-CM

## 2021-02-07 DIAGNOSIS — E785 Hyperlipidemia, unspecified: Secondary | ICD-10-CM | POA: Insufficient documentation

## 2021-02-07 DIAGNOSIS — Z86718 Personal history of other venous thrombosis and embolism: Secondary | ICD-10-CM | POA: Insufficient documentation

## 2021-02-07 DIAGNOSIS — F32A Depression, unspecified: Secondary | ICD-10-CM | POA: Insufficient documentation

## 2021-02-07 DIAGNOSIS — D126 Benign neoplasm of colon, unspecified: Secondary | ICD-10-CM | POA: Insufficient documentation

## 2021-02-07 DIAGNOSIS — Z79899 Other long term (current) drug therapy: Secondary | ICD-10-CM

## 2021-02-07 NOTE — Progress Notes (Addendum)
Patient: [name]  Service Category: E/M  Provider: Gaspar [name], [name]  DOB: [birth year]  DOS: 02/07/2021  Referring Provider: Gaspar [name], [name]  MRN: [id number]  Setting: Ambulatory outpatient  PCP: [name], [name]  Type: New Patient  Specialty: Interventional Pain Management    Location: Office  Delivery: Face-to-face     Primary Reason(s) for Visit: Encounter for initial evaluation of one or more chronic problems (new to examiner) potentially causing chronic pain, and posing a threat to normal musculoskeletal function. (Level of risk: High) CC: Back Pain  HPI  [name] is a 75 y.o. year old, male patient, who comes for the first time to our practice referred by [name], [name] for our initial evaluation of his chronic pain. He has Hypertension; Type 2 diabetes mellitus ([name]); Stasis dermatitis of both legs; Chronic lower extremity pain (2ry area of Pain) (Bilateral) (R>L); Chronic venous insufficiency; Chronic coronary artery disease; Depressive disorder; Solitary pulmonary nodule; Acquired hypothyroidism; Adenomatous colon polyp; Anxiety; B12 deficiency; Blepharitis; Blood in urine; Calculus of kidney; Chronic obstructive pulmonary disease ([place]); Chronic pain syndrome; Chronic prostatitis; Chronic systolic CHF (congestive heart failure) ([place]); Chronic, continuous use of opioids; Encounter for screening for malignant neoplasm of respiratory organs; Encounter for monitoring opioid maintenance therapy; Flatulence, eructation and gas pain; GERD (gastroesophageal reflux disease); Hemangioma; Hx of deep venous thrombosis; Hyperlipidemia; Irritable bowel syndrome; Chronic low back pain (1ry area of Pain) (Bilateral) (R>L) w/ sciatica (Bilateral); Lumbar post-laminectomy syndrome; 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[place]); Chronic ankle pain (Bilateral); Pain in joint, ankle and foot; Paroxysmal atrial fibrillation ([place]); Polyneuropathy associated with underlying disease  ([place]); Presence of intrathecal pump; Sebaceous cyst; Senile purpura ([place]); 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, parasomnia type; Smoking; Tobacco dependence syndrome; Type 2 diabetes mellitus with complication, without long-term current use of insulin ([place]); Pharmacologic therapy; Disorder of skeletal system; Problems influencing health status; Lumbosacral radiculopathy at S1 (Bilateral) (R>L); Chronic feet pain (3ry area of Pain) (Bilateral) (R>L); Chronic knee pain (4th area of Pain) (Bilateral) (R>L); Failed back surgical syndrome; Lower extremity weakness (Bilateral); Chronic neuropathic pain; Peripheral neurogenic pain; Nonfamilial nocturnal leg cramps (Bilateral); Restless leg syndrome; Malfunction of intrathecal infusion pump; End of battery life of intrathecal infusion pump; Retinal tear, right; and Vitreous hemorrhage of right eye (HCC) on their problem list. Today he comes in for evaluation of his Back Pain  Pain Assessment: Location:   Back Radiating: Pain radiaties down both leg to his feet Onset: More than a month ago Duration: Chronic pain Quality: Sharp, Aching, Constant Severity: 8 /10 (subjective, self-reported pain score)  Effect on ADL: limits my daily activities Timing: Constant Modifying factors: nothing BP: 139/66  HR: 78  Onset and Duration: Present longer than 3 months1998 Cause of pain: Work related accident or event Severity: Getting worse, NAS-11 at its worse: 10/10, NAS-11 at its best: 8/10, NAS-11 now: 9/10, and NAS-11 on the average: 8/10 Timing: Not influenced by the time of the day, During activity or exercise, After activity or exercise, and After a period of immobility Aggravating Factors: Bending, Kneeling, Lifiting, Motion, Prolonged sitting, Prolonged standing, Squatting, Stooping , Surgery made it worse, Twisting, Walking, Walking uphill, Walking downhill, and Working Alleviating Factors: Lying down, Medications, Nerve blocks, Resting, Sitting,  Sleeping, Standing, and Warm showers or baths Associated Problems: Constipation, Day-time cramps, Night-time cramps, Depression, Erectile dysfunction, Fatigue, Impotence, Inability to concentrate, Nausea, Numbness, Spasms, Temperature changes, Tingling, Pain that wakes patient up, and Pain  that does not allow patient to sleep Quality of Pain: Aching, Agonizing, Burning, Constant, Cramping, Cruel, Deep, Disabling, Dreadful, Dull, Feeling of constriction, Heavy, Horrible, Nagging, Pressure-like, Pulsating, Punishing, Sharp, Shooting, Stabbing, Tender, Throbbing, Tingling, Tiring, Uncomfortable, and Work related Previous Examinations or Tests: CT scan, Nerve block, and X-rays Previous Treatments: Epidural steroid injections, Morphine pump, Narcotic medications, and Pool exercises  [name] used to be a patient of mine, a long time ago.  His primary area of pain is that of the lower back (Bilateral) (R>L).  He has a history positive for 3 back surgeries with the last 1 having been done around 1999.  The injury to his back came about as a consequence of a work-related accident.  He has had multiple nerve blocks over his lifetime, he has also had physical therapy, and it has been more than 2 years since his last x-rays.  The patient was being managed at the [facility] pain clinic where he had his initial intrathecal pump (which I implanted) replaced by [name], Gabriel Mech, [name]  .  Unfortunately, the catheter placement on the pump was not in optimal location and he never attained the same type of relief that he did well we were taking care of his intrathecal pump.  Because it was not working for him, they decided to start tapering down the medication and the last time that the intrathecal pump was accessed was around January 2022.  He indicates that at that time he asked him to switch the medication to sterile normal saline solution, which they did.  Soon after that, around the time of his birthday, 09/11/2018 he was  admitted to the hospital with severe withdrawals.  His pain physician retired from Network engineer at [employer] and he was taken off of his Valium as well as most of his medications.  He is currently not taking any.  The patient's secondary area of pain is that of the lower extremities (Bilateral) (R> L).  In the case of his right lower extremity the pain runs down the back of the leg all the way down into the bottom of his foot and what seems to be an S1 dermatomal distribution.  In the case of the left lower extremity the pattern is exactly the same, but just not as bad.  He is experiencing significant weakness of both lower extremities and he refers that he has not had a nerve conduction test to evaluate this.  In addition, the patient has being having significant lower extremity spasms and night.  He indicates that these keep him from being able to sleep.  The patient's third area pain is that of his feet (Bilateral) (R>L).  Denies any surgeries and he does have a history of non-insulin-dependent diabetes mellitus.  He denies any nerve conduction test.  The fourth area pain is that of the knees (Bilateral) (R>L).  The patient denies any surgeries, joint injections, physical therapy, or recent x-rays.  Pharmacotherapy: He is currently on Lyrica 50 mg p.o. 3 times daily and he was given clonidine 0.1 mg p.o. twice daily when he had his Valium stopped.  They told him that the clonidine was for his panic attacks, which I recently learned that he does have some degree of effectiveness in bringing down anxiety.  The patient previously was on hydromorphone, but his wife indicates that while he was on the Lyrica and the hydromorphone he would have a lot more falls since he seems to be stumbling.  Today I took the time to  provide the patient with information regarding my pain practice. The patient was informed that my practice is divided into two sections: an interventional pain management section, as well as a completely  separate and distinct medication management section. I explained that I have procedure days for my interventional therapies, and evaluation days for follow-ups and medication management. Because of the amount of documentation required during both, they are kept separated. This means that there is the possibility that he may be scheduled for a procedure on one day, and medication management the next. I have also informed him that because of staffing and facility limitations, I no longer take patients for medication management only. To illustrate the reasons for this, I gave the patient the example of surgeons, and how inappropriate it would be to refer a patient to his/her care, just to write for the post-surgical antibiotics on a surgery done by a different surgeon.   Because interventional pain management is my board-certified specialty, the patient was informed that joining my practice means that they are open to any and all interventional therapies. I made it clear that this does not mean that they will be forced to have any procedures done. What this means is that I believe interventional therapies to be essential part of the diagnosis and proper management of chronic pain conditions. Therefore, patients not interested in these interventional alternatives will be better served under the care of a different practitioner.  The patient was also made aware of my Comprehensive Pain Management Safety Guidelines where by joining my practice, they limit all of their nerve blocks and joint injections to those done by our practice, for as long as we are retained to manage their care.   Historic Controlled Substance Pharmacotherapy Review  PMP and historical list of controlled substances: Pregabalin 50 mg, 1 cap p.o. 3 times daily (150 mg/day of pregabalin); hydromorphone 4 mg tablet, 1 tab p.o. 4 times daily (16 mg/day of hydromorphone) (12/14/2020) (64 MME); hydromorphone 2 mg tab, 1 tab p.o. every 4 hours;  fentanyl 25 mcg/h patch every 72 hours; morphine sulfate (for intrathecal pump) (07/04/2020) (prior prescriber: Hal Morales; [name]; [facility] Current opioid analgesics: None MME/day: 0 mg/day  Historical Monitoring: The patient  reports no history of drug use. List of all UDS Test(s): No results found for: MDMA, COCAINSCRNUR, [place], [place], CANNABQUANT, THCU, [place] List of other Serum/Urine Drug Screening Test(s):  No results found for: AMPHSCRSER, BARBSCRSER, BENZOSCRSER, COCAINSCRSER, COCAINSCRNUR, PCPSCRSER, PCPQUANT, THCSCRSER, THCU, CANNABQUANT, OPIATESCRSER, OXYSCRSER, PROPOXSCRSER, ETH Historical Background Evaluation: [place] PMP: PDMP reviewed during this encounter. Online review of the past 70-monthperiod conducted.             PMP NARX Score Report:  Narcotic: 480 Sedative: 381 Stimulant: 000 [place] Department of public safety, offender search: (Editor, commissioningInformation) Non-contributory Risk Assessment Profile: Aberrant behavior: None observed or detected today Risk factors for fatal opioid overdose: None identified today PMP NARX Overdose Risk Score: 380 Fatal overdose hazard ratio (HR): Calculation deferred Non-fatal overdose hazard ratio (HR): Calculation deferred Risk of opioid abuse or dependence: 0.7-3.0% with doses ? 36 MME/day and 6.1-26% with doses ? 120 MME/day. Substance use disorder (SUD) risk level: See below Personal History of Substance Abuse (SUD-Substance use disorder):  Alcohol: Negative  Illegal Drugs: Negative  Rx Drugs: Negative  ORT Risk Level calculation: Low Risk  Opioid Risk Tool - 02/07/21 1135       Family History of Substance Abuse   Alcohol Negative  Illegal Drugs Negative    Rx Drugs Negative      Personal History of Substance Abuse   Alcohol Negative    Illegal Drugs Negative    Rx Drugs Negative      Age   Age between 27-45 years  No      History of Preadolescent Sexual Abuse   History of  Preadolescent Sexual Abuse Negative or Male      Psychological Disease   Psychological Disease Negative    Depression Negative      Total Score   Opioid Risk Tool Scoring 0    Opioid Risk Interpretation Low Risk            ORT Scoring interpretation table:  Score <3 = Low Risk for SUD  Score between 4-7 = Moderate Risk for SUD  Score >8 = High Risk for Opioid Abuse   PHQ-2 Depression Scale:  Total score:    PHQ-2 Scoring interpretation table: (Score and probability of major depressive disorder)  Score 0 = No depression  Score 1 = 15.4% Probability  Score 2 = 21.1% Probability  Score 3 = 38.4% Probability  Score 4 = 45.5% Probability  Score 5 = 56.4% Probability  Score 6 = 78.6% Probability   PHQ-9 Depression Scale:  Total score:    PHQ-9 Scoring interpretation table:  Score 0-4 = No depression  Score 5-9 = Mild depression  Score 10-14 = Moderate depression  Score 15-19 = Moderately severe depression  Score 20-27 = Severe depression (2.4 times higher risk of SUD and 2.89 times higher risk of overuse)   Pharmacologic Plan: As per protocol, I have not taken over any controlled substance management, pending the results of ordered tests and/or consults.            Initial impression: Pending review of available data and ordered tests.  Meds   Current Outpatient Medications:    albuterol (PROAIR HFA) 108 (90 Base) MCG/ACT inhaler, Inhale into the lungs every 6 (six) hours as needed for wheezing or shortness of breath., Disp: , Rfl:    amitriptyline (ELAVIL) 100 MG tablet, Take 100 mg by mouth at bedtime., Disp: , Rfl:    bisacodyl (DULCOLAX) 5 MG EC tablet, Take 5 mg by mouth daily as needed for moderate constipation., Disp: , Rfl:    Bupivacaine HCl (BUPIVACAINE ON-Q PAIN PUMP, FOR ORDER SET NO CHG,), by Other route once. Intrathecal pain pump, Disp: , Rfl:    cloNIDine (CATAPRES) 0.1 MG tablet, Take 0.1 mg by mouth 2 (two) times daily., Disp: , Rfl:    DULoxetine  (CYMBALTA) 60 MG capsule, Take by mouth., Disp: , Rfl:    levothyroxine (SYNTHROID, LEVOTHROID) 75 MCG tablet, Take 0.125 mcg by mouth daily before breakfast., Disp: , Rfl:    losartan (COZAAR) 25 MG tablet, Take 25 mg by mouth daily., Disp: , Rfl:    Multiple Vitamin (MULTIVITAMIN) capsule, Take 1 capsule by mouth daily., Disp: , Rfl:    naloxone (NARCAN) nasal spray 4 mg/0.1 mL, Place into the nose., Disp: , Rfl:    pregabalin (LYRICA) 50 MG capsule, Take 50 mg by mouth 3 (three) times daily., Disp: , Rfl:    promethazine (PHENERGAN) 12.5 MG tablet, Take 12.5 mg by mouth every 6 (six) hours as needed for nausea or vomiting., Disp: , Rfl:    simvastatin (ZOCOR) 40 MG tablet, Take 40 mg by mouth daily., Disp: , Rfl:    Tiotropium Bromide-Olodaterol (STIOLTO RESPIMAT IN), Inhale into the lungs.,  Disp: , Rfl:   Imaging Review  Cervical Imaging: Cervical CT wo contrast: Results for orders placed during the hospital encounter of 08/26/19 CT CERVICAL SPINE WO CONTRAST  Narrative CLINICAL DATA:  75 year old male with fall and headache.  EXAM: CT HEAD WITHOUT CONTRAST  CT CERVICAL SPINE WITHOUT CONTRAST  TECHNIQUE: Multidetector CT imaging of the head and cervical spine was performed following the standard protocol without intravenous contrast. Multiplanar CT image reconstructions of the cervical spine were also generated.  COMPARISON:  Head CT dated 11/07/2009 and MRI dated 08/09/2017.  FINDINGS: CT HEAD FINDINGS  Brain: The ventricles and sulci appropriate size for patient's age. The gray-white matter discrimination is preserved. There is no acute intracranial hemorrhage. No mass effect or midline shift. No extra-axial fluid collection.  Vascular: No hyperdense vessel or unexpected calcification.  Skull: Normal. Negative for fracture or focal lesion.  Sinuses/Orbits: Mild diffuse mucoperiosteal thickening of paranasal sinuses. No air-fluid level. Prior right  mastectomy.  Other: There is a focal area of skin thickening over the right parietal calvarium (series 2 image 12) measuring approximately 4 mm in thickness and 17 mm in length. This may represent an area of scarring. Correlation with clinical exam is recommended.  CT CERVICAL SPINE FINDINGS  Alignment: No acute subluxation. There is grade 1 C3-C4 and C4-C5 anterolisthesis.  Skull base and vertebrae: No acute fracture. No primary bone lesion or focal pathologic process.  Soft tissues and spinal canal: No prevertebral fluid or swelling. No visible canal hematoma.  Disc levels: Degenerative changes primarily at C5-C6 and C6-C7 with endplate irregularity and disc space narrowing and osteophyte. There is associated mild neural foramina at these levels secondary to disc osteophyte complex. There is facet arthropathy at C3-C4 on the right and C4-C5 left greater right.  Upper chest: Emphysema.  Other: Bilateral carotid bulb calcified plaques.  IMPRESSION: 1. Unremarkable noncontrast CT of the brain for age. 2. No acute/traumatic cervical spine pathology. 3. Focal area of skin thickening over the right parietal scalp.   Electronically Signed By: Anner Crete M.D. On: 08/26/2019 16:38  Complexity Note: Imaging results reviewed. Results shared with [name], using Layman's terms.                        ROS  Cardiovascular: High blood pressure, Heart attack ( Date: 2000), and Heart catheterization Pulmonary or Respiratory: Wheezing and difficulty taking a deep full breath (Asthma), Smoking, and Snoring  Neurological: No reported neurological signs or symptoms such as seizures, abnormal skin sensations, urinary and/or fecal incontinence, being born with an abnormal open spine and/or a tethered spinal cord Psychological-Psychiatric: Anxiousness and Depressed Gastrointestinal: Heartburn due to stomach pushing into lungs (Hiatal hernia) and Irregular, infrequent bowel movements  (Constipation) Genitourinary: Passing kidney stones Hematological: Brusing easily and Bleeding easily Endocrine: High blood sugar controlled without the use of insulin (NIDDM) and Slow thyroid Rheumatologic: Rheumatoid arthritis Musculoskeletal: Negative for myasthenia gravis, muscular dystrophy, multiple sclerosis or malignant hyperthermia Work History: Disabled  Allergies  [name] is allergic to codeine, levofloxacin, morphine and related, oxycontin [oxycodone hcl], and penicillins.  Laboratory Chemistry Profile   Renal Lab Results  Component Value Date   BUN 10 02/07/2021   CREATININE 0.94 02/07/2021   BCR 11 02/07/2021   GFRAA >60 03/02/2015   GFRNONAA >60 10/04/2020   PROTEINUR NEGATIVE 10/04/2020     Electrolytes Lab Results  Component Value Date   NA 142 02/07/2021   K 4.8 02/07/2021   CL 103  02/07/2021   CALCIUM 9.2 02/07/2021   MG 2.2 02/07/2021     Hepatic Lab Results  Component Value Date   AST 18 02/07/2021   ALBUMIN 4.2 02/07/2021   ALKPHOS 123 (H) 02/07/2021     ID No results found for: LYMEIGGIGMAB, HIV, SARSCOV2NAA, STAPHAUREUS, MRSAPCR, HCVAB, PREGTESTUR, RMSFIGG, QFVRPH1IGG, QFVRPH2IGG, LYMEIGGIGMAB   Bone Lab Results  Component Value Date   25OHVITD1 22 (L) 02/07/2021   25OHVITD2 <1.0 02/07/2021   25OHVITD3 22 02/07/2021     Endocrine Lab Results  Component Value Date   GLUCOSE 102 (H) 02/07/2021   GLUCOSEU NEGATIVE 10/04/2020   HGBA1C 6.1 (H) 02/07/2021     Neuropathy Lab Results  Component Value Date   VITAMINB12 249 02/07/2021   HGBA1C 6.1 (H) 02/07/2021     CNS No results found for: COLORCSF, APPEARCSF, RBCCOUNTCSF, WBCCSF, POLYSCSF, LYMPHSCSF, EOSCSF, PROTEINCSF, GLUCCSF, JCVIRUS, CSFOLI, IGGCSF, LABACHR, ACETBL, LABACHR, ACETBL   Inflammation (CRP: Acute  ESR: Chronic) Lab Results  Component Value Date   CRP 3 02/07/2021   ESRSEDRATE 6 02/07/2021     Rheumatology No results found for: RF, ANA, LABURIC, URICUR,  LYMEIGGIGMAB, LYMEABIGMQN, HLAB27   Coagulation Lab Results  Component Value Date   INR 0.96 03/07/2015   LABPROT 13.0 03/07/2015   PLT 229 10/04/2020     Cardiovascular Lab Results  Component Value Date   HGB 14.4 10/04/2020   HCT 45.7 10/04/2020     Screening No results found for: SARSCOV2NAA, COVIDSOURCE, STAPHAUREUS, MRSAPCR, HCVAB, HIV, PREGTESTUR   Cancer No results found for: CEA, CA125, LABCA2   Allergens No results found for: ALMOND, APPLE, ASPARAGUS, AVOCADO, BANANA, BARLEY, BASIL, BAYLEAF, GREENBEAN, LIMABEAN, WHITEBEAN, BEEFIGE, REDBEET, BLUEBERRY, BROCCOLI, CABBAGE, MELON, CARROT, CASEIN, CASHEWNUT, CAULIFLOWER, CELERY     Note: Lab results reviewed.  PFSH  Drug: [name]  reports no history of drug use. Alcohol:  reports no history of alcohol use. Tobacco:  reports that he has been smoking cigarettes. He has been smoking an average of 1 pack per day. He has never used smokeless tobacco. Medical:  has a past medical history of Anxiety, Arthritis, Asthma, Cancer ([place]), CHF (congestive heart failure) ([place]), COPD (chronic obstructive pulmonary disease) ([place]), Coronary artery disease, Depression, Diabetes mellitus without complication ([place]), DVT of lower extremity, bilateral ([place]), Dysrhythmia, Hypertension, Neuropathy, Pain, Peripheral vascular disease ([place]), S/P IVC filter, Sleep apnea, Sleep disorder, and Stasis dermatitis of both legs. Family: family history includes Emphysema in his father and sister; Heart attack in his father; Heart disease in his brother and mother.  Past Surgical History:  Procedure Laterality Date   BACK SURGERY     lumbar fusion   BLADDER TUMOR EXCISION     CERVICAL FUSION     COLON SURGERY     bowel blockage   COLONOSCOPY WITH PROPOFOL N/A 02/27/2018   Procedure: COLONOSCOPY WITH PROPOFOL;  Surgeon: Manya Silvas, [name];  Location: [facility] ENDOSCOPY;  Service: Endoscopy;  Laterality: N/A;   CORONARY ANGIOPLASTY     2000   INFUSION PUMP  REPLACEMENT     INTRATHECAL PUMP IMPLANTATION     MASTOIDECTOMY     x 3   NISSEN FUNDOPLICATION     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   Procedure: TRANSURETHRAL RESECTION OF BLADDER TUMOR (TURBT);  Surgeon: Royston Cowper, [name];  Location: [facility] ORS;  Service: Urology;  Laterality: N/A;   Active Ambulatory Problems    Diagnosis Date Noted   Hypertension 08/03/2012   Type 2 diabetes mellitus (  [place]) 07/23/1999   Stasis dermatitis of both legs 01/24/2017   Chronic lower extremity pain (2ry area of Pain) (Bilateral) (R>L) 01/24/2017   Chronic venous insufficiency 03/24/2018   Chronic coronary artery disease 02/07/2021   Depressive disorder 02/07/2021   Solitary pulmonary nodule 02/07/2021   Acquired hypothyroidism 10/04/2018   Adenomatous colon polyp 02/07/2021   Anxiety 02/18/2012   B12 deficiency 08/26/2018   Blepharitis 02/07/2021   Blood in urine 02/07/2021   Calculus of kidney 02/07/2021   Chronic obstructive pulmonary disease ([place]) 08/26/2018   Chronic pain syndrome 02/18/2012   Chronic prostatitis 10/93/2355   Chronic systolic CHF (congestive heart failure) ([place]) 08/05/2019   Chronic, continuous use of opioids 04/21/2012   Encounter for screening for malignant neoplasm of respiratory organs 02/07/2021   Encounter for monitoring opioid maintenance therapy 06/29/2015   Flatulence, eructation and gas pain 02/07/2021   GERD (gastroesophageal reflux disease) 11/30/2017   Hemangioma 02/07/2021   Hx of deep venous thrombosis 11/22/2015   Hyperlipidemia 02/07/2021   Irritable bowel syndrome 02/07/2021   Chronic low back pain (1ry area of Pain) (Bilateral) (R>L) w/ sciatica (Bilateral) 02/07/2021   Lumbar post-laminectomy syndrome 02/18/2012   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[place]) 10/03/2015   Chronic ankle pain (Bilateral) 02/07/2021   Pain in joint, ankle and foot 02/07/2021   Paroxysmal atrial fibrillation ([place]) 11/22/2015   Polyneuropathy  associated with underlying disease ([place]) 08/26/2018   Presence of intrathecal pump 04/15/2014   Sebaceous cyst 02/07/2021   Senile purpura ([place]) 06/24/2017   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, parasomnia type 02/18/2012   Smoking 03/30/2012   Tobacco dependence syndrome 02/07/2021   Type 2 diabetes mellitus with complication, without long-term current use of insulin ([place]) 07/05/2015   Pharmacologic therapy 02/07/2021   Disorder of skeletal system 02/07/2021   Problems influencing health status 02/07/2021   Lumbosacral radiculopathy at S1 (Bilateral) (R>L) 02/07/2021   Chronic feet pain (3ry area of Pain) (Bilateral) (R>L) 02/07/2021   Chronic knee pain (4th area of Pain) (Bilateral) (R>L) 02/07/2021   Failed back surgical syndrome 02/07/2021   Lower extremity weakness (Bilateral) 02/07/2021   Chronic neuropathic pain 02/07/2021   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   Nonfamilial nocturnal leg cramps (Bilateral) 02/07/2021   Restless leg syndrome 02/07/2021   Malfunction of intrathecal infusion pump 02/07/2021   End of battery life of intrathecal infusion pump 02/07/2021   Retinal tear, right 02/22/2021   Vitreous hemorrhage of right eye ([place]) 02/22/2021   Resolved Ambulatory Problems    Diagnosis Date Noted   Swelling of limb 01/24/2017   Contusion of knee 02/07/2021   Sprain of ankle 02/07/2021   Sprain of rotator cuff capsule 02/07/2021   Past Medical History:  Diagnosis Date   Arthritis    Asthma    Cancer (HCC)    CHF (congestive heart failure) (HCC)    COPD (chronic obstructive pulmonary disease) ([place])    Coronary artery disease    Depression    Diabetes mellitus without complication ([place])    DVT of lower extremity, bilateral ([place])    Dysrhythmia    Neuropathy    Pain    Peripheral vascular disease ([place])    S/P IVC filter    Sleep apnea    Sleep disorder    Constitutional Exam  General appearance: Well nourished, well developed, and well  hydrated. In no apparent acute distress Vitals:   02/07/21 1117  BP: 139/66  Pulse: 78  Temp: (!) 97 F (  36.1 C)  SpO2: 97%  Weight: 210 lb (95.3 kg)  Height: '6\' 3"'  (1.594 m)   BMI Assessment: Estimated body mass index is 26.25 kg/m as calculated from the following:   Height as of this encounter: '6\' 3"'  (1.905 m).   Weight as of this encounter: 210 lb (95.3 kg).  BMI interpretation table: BMI level Category Range association with higher incidence of chronic pain  <18 kg/m2 Underweight   18.5-24.9 kg/m2 Ideal body weight   25-29.9 kg/m2 Overweight Increased incidence by 20%  30-34.9 kg/m2 Obese (Class I) Increased incidence by 68%  35-39.9 kg/m2 Severe obesity (Class II) Increased incidence by 136%  >40 kg/m2 Extreme obesity (Class III) Increased incidence by 254%   Patient's current BMI Ideal Body weight  Body mass index is 26.25 kg/m. Ideal body weight: 84.5 kg (186 lb 4.6 oz) Adjusted ideal body weight: 88.8 kg (195 lb 12.4 oz)   BMI Readings from Last 4 Encounters:  02/07/21 26.25 kg/m  10/04/20 26.71 kg/m  09/08/20 25.94 kg/m  04/28/18 28.25 kg/m   Wt Readings from Last 4 Encounters:  02/07/21 210 lb (95.3 kg)  10/04/20 208 lb (94.3 kg)  09/08/20 202 lb (91.6 kg)  04/28/18 220 lb (99.8 kg)    Psych/Mental status: Alert, oriented x 3 (person, place, & time)       Eyes: PERLA Respiratory: No evidence of acute respiratory distress  Assessment  Primary Diagnosis & Pertinent Problem List: The primary encounter diagnosis was Chronic pain syndrome. 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), Nonfamilial nocturnal leg cramps (Bilateral), Restless leg syndrome, Chronic knee pain (4th area of Pain) (Bilateral) (R>L), Peripheral neurogenic pain, Chronic neuropathic  pain, Chronic feet pain (3ry area of Pain) (Bilateral) (R>L), Failed back surgical syndrome, Presence of intrathecal pump, Malfunction of intrathecal infusion pump, sequela, End of battery life of intrathecal infusion pump, Type 2 diabetes mellitus with complication, without long-term current use of insulin ([place]), Pharmacologic therapy, Disorder of skeletal system, and Problems influencing health status were also pertinent to this visit.  Visit Diagnosis (New problems to examiner): 1. Chronic pain syndrome   2. 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 Chronic lower extremity pain (2ry area of Pain) (Bilateral) (R>L)   4. Lower extremity weakness (Bilateral)   5. 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 Nonfamilial nocturnal leg cramps (Bilateral)   7. Restless leg syndrome   8. Chronic knee pain (4th area of Pain) (Bilateral) (R>L)   9. Peripheral neurogenic pain   10. Chronic neuropathic pain   11. Chronic feet pain (3ry area of Pain) (Bilateral) (R>L)   12. Failed back surgical syndrome   13. Presence of intrathecal pump   14. Malfunction of intrathecal infusion pump, sequela   15. End of battery life of intrathecal infusion pump   16. Type 2 diabetes mellitus with complication, without long-term current use of insulin ([place])   17. Pharmacologic therapy   18. Disorder of skeletal system   19. Problems influencing health status    Plan of Care (Initial workup plan)  Note: [name] was reminded that as per protocol, today's visit has been an evaluation only. We have not taken over the patient's controlled substance management.  Problem-specific plan: No problem-specific Assessment & Plan notes found for this  encounter. Lab Orders         Compliance Drug Analysis, Ur         Comp. Metabolic Panel (12)         Magnesium         Vitamin B12         Sedimentation rate         25-Hydroxy vitamin D Lcms D2+D3         C-reactive  protein         Hemoglobin A1c     Imaging Orders         DG Knee Complete 4 Views Right         DG Knee Complete 4 Views Left     Referral Orders         Ambulatory referral to Neurosurgery     Procedure Orders    No procedure(s) ordered today   Pharmacotherapy (current): Medications ordered:  No orders of the defined types were placed in this encounter.  Medications administered during this visit: Elenore Rota A. Tippin had no medications administered during this visit.   Pharmacological management options:  Opioid Analgesics: The patient was informed that there is no guarantee that he would be a candidate for opioid analgesics. The decision will be made following CDC guidelines. This decision will be based on the results of diagnostic studies, as well as [name] risk profile.   Membrane stabilizer: To be determined at a later time  Muscle relaxant: To be determined at a later time  NSAID: To be determined at a later time  Other analgesic(s): To be determined at a later time   Interventional management options: [name] was informed that there is no guarantee that he would be a candidate for interventional therapies. The decision will be based on the results of diagnostic studies, as well as [name] risk profile.  Procedure(s) under consideration:  Pending results of ordered studies    Interventional Therapies  Risk  Complexity Considerations:   Estimated body mass index is 26.25 kg/m as calculated from the following:   Height as of this encounter: '6\' 3"'  (1.905 m).   Weight as of this encounter: 210 lb (95.3 kg). WNL   Planned  Pending:   Pending further evaluation   Under consideration:   Pending results from evaluation.   Completed:   None at this time   Therapeutic  Palliative (PRN) options:   None established   Provider-requested follow-up: Return for evaluation day (40 min) 2nd Visit "Plan of Care".  Future Appointments  Date Time Provider [place]  04/05/2021  1:00 PM Rankin, Clent Demark, [name] [facility]DE-RDE None  04/09/2021  2:00 PM Milinda Pointer, [name] [facility] None     Note by: [name], [name] Date: 02/07/2021; Time: 2:56 PM

## 2021-02-07 NOTE — Patient Instructions (Addendum)
____________________________________________________________________________________________  Medication Rules  Purpose: To inform patients, and their family members, of our rules and regulations.  Applies to: All patients receiving prescriptions (written or electronic).  Pharmacy of record: Pharmacy where electronic prescriptions will be sent. If written prescriptions are taken to a different pharmacy, please inform the nursing staff. The pharmacy listed in the electronic medical record should be the one where you would like electronic prescriptions to be sent.  Electronic prescriptions: In compliance with the [place] Strengthen Opioid Misuse Prevention (STOP) Act of 2017 (Session Law 2017-74/H243), effective July 22, 2018, all controlled substances must be electronically prescribed. Calling prescriptions to the pharmacy will cease to exist.  Prescription refills: Only during scheduled appointments. Applies to all prescriptions.  NOTE: The following applies primarily to controlled substances (Opioid* Pain Medications).   Type of encounter (visit): For patients receiving controlled substances, face-to-face visits are required. (Not an option or up to the patient.)  Patient's responsibilities: Pain Pills: Bring all pain pills to every appointment (except for procedure appointments). Pill Bottles: Bring pills in original pharmacy bottle. Always bring the newest bottle. Bring bottle, even if empty. Medication refills: You are responsible for knowing and keeping track of what medications you take and those you need refilled. The day before your appointment: write a list of all prescriptions that need to be refilled. The day of the appointment: give the list to the admitting nurse. Prescriptions will be written only during appointments. No prescriptions will be written on procedure days. If you forget a medication: it will not be "Called in", "Faxed", or "electronically sent". You will  need to get another appointment to get these prescribed. No early refills. Do not call asking to have your prescription filled early. Prescription Accuracy: You are responsible for carefully inspecting your prescriptions before leaving our office. Have the discharge nurse carefully go over each prescription with you, before taking them home. Make sure that your name is accurately spelled, that your address is correct. Check the name and dose of your medication to make sure it is accurate. Check the number of pills, and the written instructions to make sure they are clear and accurate. Make sure that you are given enough medication to last until your next medication refill appointment. Taking Medication: Take medication as prescribed. When it comes to controlled substances, taking less pills or less frequently than prescribed is permitted and encouraged. Never take more pills than instructed. Never take medication more frequently than prescribed.  Inform other Doctors: Always inform, all of your healthcare providers, of all the medications you take. Pain Medication from other Providers: You are not allowed to accept any additional pain medication from any other Doctor or Healthcare provider. There are two exceptions to this rule. (see below) In the event that you require additional pain medication, you are responsible for notifying us, as stated below. Cough Medicine: Often these contain an opioid, such as codeine or hydrocodone. Never accept or take cough medicine containing these opioids if you are already taking an opioid* medication. The combination may cause respiratory failure and death. Medication Agreement: You are responsible for carefully reading and following our Medication Agreement. This must be signed before receiving any prescriptions from our practice. Safely store a copy of your signed Agreement. Violations to the Agreement will result in no further prescriptions. (Additional copies of our  Medication Agreement are available upon request.) Laws, Rules, & Regulations: All patients are expected to follow all Federal and State Laws, Statutes, Rules, & Regulations. Ignorance of   the Laws does not constitute a valid excuse.  Illegal drugs and Controlled Substances: The use of illegal substances (including, but not limited to marijuana and its derivatives) and/or the illegal use of any controlled substances is strictly prohibited. Violation of this rule may result in the immediate and permanent discontinuation of any and all prescriptions being written by our practice. The use of any illegal substances is prohibited. Adopted CDC guidelines & recommendations: Target dosing levels will be at or below 60 MME/day. Use of benzodiazepines** is not recommended.  Exceptions: There are only two exceptions to the rule of not receiving pain medications from other Healthcare Providers. Exception #1 (Emergencies): In the event of an emergency (i.e.: accident requiring emergency care), you are allowed to receive additional pain medication. However, you are responsible for: As soon as you are able, call our office [phone], at any time of the day or night, and leave a message stating your name, the date and nature of the emergency, and the name and dose of the medication prescribed. In the event that your call is answered by a member of our staff, make sure to document and save the date, time, and the name of the person that took your information.  Exception #2 (Planned Surgery): In the event that you are scheduled by another doctor or dentist to have any type of surgery or procedure, you are allowed (for a period no longer than 30 days), to receive additional pain medication, for the acute post-op pain. However, in this case, you are responsible for picking up a copy of our "Post-op Pain Management for Surgeons" handout, and giving it to your surgeon or dentist. This document is available at our office, and  does not require an appointment to obtain it. Simply go to our office during business hours (Monday-Thursday from 8:00 AM to 4:00 PM) (Friday 8:00 AM to 12:00 Noon) or if you have a scheduled appointment with us, prior to your surgery, and ask for it by name. In addition, you are responsible for: calling our office [phone], at any time of the day or night, and leaving a message stating your name, name of your surgeon, type of surgery, and date of procedure or surgery. Failure to comply with your responsibilities may result in termination of therapy involving the controlled substances.  *Opioid medications include: morphine, codeine, oxycodone, oxymorphone, hydrocodone, hydromorphone, meperidine, tramadol, tapentadol, buprenorphine, fentanyl, methadone. **Benzodiazepine medications include: diazepam (Valium), alprazolam (Xanax), clonazepam (Klonopine), lorazepam (Ativan), clorazepate (Tranxene), chlordiazepoxide (Librium), estazolam (Prosom), oxazepam (Serax), temazepam (Restoril), triazolam (Halcion) (Last updated: 06/19/2020) ____________________________________________________________________________________________  ____________________________________________________________________________________________  Medication Recommendations and Reminders  Applies to: All patients receiving prescriptions (written and/or electronic).  Medication Rules & Regulations: These rules and regulations exist for your safety and that of others. They are not flexible and neither are we. Dismissing or ignoring them will be considered "non-compliance" with medication therapy, resulting in complete and irreversible termination of such therapy. (See document titled "Medication Rules" for more details.) In all conscience, because of safety reasons, we cannot continue providing a therapy where the patient does not follow instructions.  Pharmacy of record:  Definition: This is the pharmacy where your electronic  prescriptions will be sent.  We do not endorse any particular pharmacy, however, we have experienced problems with [facility] not securing enough medication supply for the community. We do not restrict you in your choice of pharmacy. However, once we write for your prescriptions, we will NOT be re-sending more prescriptions to fix restricted supply problems   created by your pharmacy, or your insurance.  The pharmacy listed in the electronic medical record should be the one where you want electronic prescriptions to be sent. If you choose to change pharmacy, simply notify our nursing staff.  Recommendations: Keep all of your pain medications in a safe place, under lock and key, even if you live alone. We will NOT replace lost, stolen, or damaged medication. After you fill your prescription, take 1 week's worth of pills and put them away in a safe place. You should keep a separate, properly labeled bottle for this purpose. The remainder should be kept in the original bottle. Use this as your primary supply, until it runs out. Once it's gone, then you know that you have 1 week's worth of medicine, and it is time to come in for a prescription refill. If you do this correctly, it is unlikely that you will ever run out of medicine. To make sure that the above recommendation works, it is very important that you make sure your medication refill appointments are scheduled at least 1 week before you run out of medicine. To do this in an effective manner, make sure that you do not leave the office without scheduling your next medication management appointment. Always ask the nursing staff to show you in your prescription , when your medication will be running out. Then arrange for the receptionist to get you a return appointment, at least 7 days before you run out of medicine. Do not wait until you have 1 or 2 pills left, to come in. This is very poor planning and does not take into consideration that we may need to  cancel appointments due to bad weather, sickness, or emergencies affecting our staff. DO NOT ACCEPT A "Partial Fill": If for any reason your pharmacy does not have enough pills/tablets to completely fill or refill your prescription, do not allow for a "partial fill". The law allows the pharmacy to complete that prescription within 72 hours, without requiring a new prescription. If they do not fill the rest of your prescription within those 72 hours, you will need a separate prescription to fill the remaining amount, which we will NOT provide. If the reason for the partial fill is your insurance, you will need to talk to the pharmacist about payment alternatives for the remaining tablets, but again, DO NOT ACCEPT A PARTIAL FILL, unless you can trust your pharmacist to obtain the remainder of the pills within 72 hours.  Prescription refills and/or changes in medication(s):  Prescription refills, and/or changes in dose or medication, will be conducted only during scheduled medication management appointments. (Applies to both, written and electronic prescriptions.) No refills on procedure days. No medication will be changed or started on procedure days. No changes, adjustments, and/or refills will be conducted on a procedure day. Doing so will interfere with the diagnostic portion of the procedure. No phone refills. No medications will be "called into the pharmacy". No Fax refills. No weekend refills. No Holliday refills. No after hours refills.  Remember:  Business hours are:  Monday to Thursday 8:00 AM to 4:00 PM Provider's Schedule: Francisco Naveira, MD - Appointments are:  Medication management: Monday and Wednesday 8:00 AM to 4:00 PM Procedure day: Tuesday and Thursday 7:30 AM to 4:00 PM Bilal Lateef, MD - Appointments are:  Medication management: Tuesday and Thursday 8:00 AM to 4:00 PM Procedure day: Monday and Wednesday 7:30 AM to 4:00 PM (Last update:  02/09/2020) ____________________________________________________________________________________________  ____________________________________________________________________________________________  CBD (cannabidiol) WARNING    Applicable to: All individuals currently taking or considering taking CBD (cannabidiol) and, more important, all patients taking opioid analgesic controlled substances (pain medication). (Example: oxycodone; oxymorphone; hydrocodone; hydromorphone; morphine; methadone; tramadol; tapentadol; fentanyl; buprenorphine; butorphanol; dextromethorphan; meperidine; codeine; etc.)  Legal status: CBD remains a Schedule I drug prohibited for any use. CBD is illegal with one exception. In the [place], CBD has a limited Food and Drug Administration (FDA) approval for the treatment of two specific types of epilepsy disorders. Only one CBD product has been approved by the FDA for this purpose: "Epidiolex". FDA is aware that some companies are marketing products containing cannabis and cannabis-derived compounds in ways that violate the Federal Food, Drug and Cosmetic Act (FD&C Act) and that may put the health and safety of consumers at risk. The FDA, a Federal agency, has not enforced the CBD status since 2018.   Legality: Some manufacturers ship CBD products nationally, which is illegal. Often such products are sold online and are therefore available throughout the country. CBD is openly sold in head shops and health food stores in some states where such sales have not been explicitly legalized. Selling unapproved products with unsubstantiated therapeutic claims is not only a violation of the law, but also can put patients at risk, as these products have not been proven to be safe or effective. Federal illegality makes it difficult to conduct research on CBD.  Reference: "FDA Regulation of Cannabis and Cannabis-Derived Products, Including Cannabidiol (CBD)" -  https://www.fda.gov/news-events/public-health-focus/fda-regulation-cannabis-and-cannabis-derived-products-including-cannabidiol-cbd  Warning: CBD is not FDA approved and has not undergo the same manufacturing controls as prescription drugs.  This means that the purity and safety of available CBD may be questionable. Most of the time, despite manufacturer's claims, it is contaminated with THC (delta-9-tetrahydrocannabinol - the chemical in marijuana responsible for the "HIGH").  When this is the case, the THC contaminant will trigger a positive urine drug screen (UDS) test for Marijuana (carboxy-THC). Because a positive UDS for any illicit substance is a violation of our medication agreement, your opioid analgesics (pain medicine) may be permanently discontinued.  MORE ABOUT CBD  General Information: CBD  is a derivative of the Marijuana (cannabis sativa) plant discovered in 1940. It is one of the 113 identified substances found in Marijuana. It accounts for up to 40% of the plant's extract. As of 2018, preliminary clinical studies on CBD included research for the treatment of anxiety, movement disorders, and pain. CBD is available and consumed in multiple forms, including inhalation of smoke or vapor, as an aerosol spray, and by mouth. It may be supplied as an oil containing CBD, capsules, dried cannabis, or as a liquid solution. CBD is thought not to be as psychoactive as THC (delta-9-tetrahydrocannabinol - the chemical in marijuana responsible for the "HIGH"). Studies suggest that CBD may interact with different biological target receptors in the body, including cannabinoid and other neurotransmitter receptors. As of 2018 the mechanism of action for its biological effects has not been determined.  Side-effects  Adverse reactions: Dry mouth, diarrhea, decreased appetite, fatigue, drowsiness, malaise, weakness, sleep disturbances, and others.  Drug interactions: CBC may interact with other medications  such as blood-thinners. (Last update: 02/26/2020) ____________________________________________________________________________________________  ____________________________________________________________________________________________  Drug Holidays (Slow)  What is a "Drug Holiday"? Drug Holiday: is the name given to the period of time during which a patient stops taking a medication(s) for the purpose of eliminating tolerance to the drug.  Benefits Improved effectiveness of opioids. Decreased opioid dose needed to achieve benefits. Improved pain with lesser dose.    What is tolerance? Tolerance: is the progressive decreased in effectiveness of a drug due to its repetitive use. With repetitive use, the body gets use to the medication and as a consequence, it loses its effectiveness. This is a common problem seen with opioid pain medications. As a result, a larger dose of the drug is needed to achieve the same effect that used to be obtained with a smaller dose.  How long should a "Drug Holiday" last? You should stay off of the pain medicine for at least 14 consecutive days. (2 weeks)  Should I stop the medicine "cold [place]"? No. You should always coordinate with your Pain Specialist so that he/she can provide you with the correct medication dose to make the transition as smoothly as possible.  How do I stop the medicine? Slowly. You will be instructed to decrease the daily amount of pills that you take by one (1) pill every seven (7) days. This is called a "slow downward taper" of your dose. For example: if you normally take four (4) pills per day, you will be asked to drop this dose to three (3) pills per day for seven (7) days, then to two (2) pills per day for seven (7) days, then to one (1) per day for seven (7) days, and at the end of those last seven (7) days, this is when the "Drug Holiday" would start.   Will I have withdrawals? By doing a "slow downward taper" like this one, it  is unlikely that you will experience any significant withdrawal symptoms. Typically, what triggers withdrawals is the sudden stop of a high dose opioid therapy. Withdrawals can usually be avoided by slowly decreasing the dose over a prolonged period of time. If you do not follow these instructions and decide to stop your medication abruptly, withdrawals may be possible.  What are withdrawals? Withdrawals: refers to the wide range of symptoms that occur after stopping or dramatically reducing opiate drugs after heavy and prolonged use. Withdrawal symptoms do not occur to patients that use low dose opioids, or those who take the medication sporadically. Contrary to benzodiazepine (example: Valium, Xanax, etc.) or alcohol withdrawals ("Delirium Tremens"), opioid withdrawals are not lethal. Withdrawals are the physical manifestation of the body getting rid of the excess receptors.  Expected Symptoms Early symptoms of withdrawal may include: Agitation Anxiety Muscle aches Increased tearing Insomnia Runny nose Sweating Yawning  Late symptoms of withdrawal may include: Abdominal cramping Diarrhea Dilated pupils Goose bumps Nausea Vomiting  Will I experience withdrawals? Due to the slow nature of the taper, it is very unlikely that you will experience any.  What is a slow taper? Taper: refers to the gradual decrease in dose.  (Last update: 02/09/2020) ____________________________________________________________________________________________ Instructed to get xrays done in the medical mall.  Labs done by [facility] in clinic.  INformed that he will be getting a Nerve conduction test and a somatosensory test.   ____________________________________________________________________________________________  Muscle Spasms & Cramps  Cause:  The most common cause of muscle spasms and cramps is vitamin and/or electrolyte (calcium, potassium, sodium, etc.) deficiencies.  Possible  triggers: Sweating - causes loss of electrolytes thru the skin. Steroids - causes loss of electrolytes thru the urine.  Treatment: Gatorade (or any other electrolyte-replenishing drink) - Take 1, 8 oz glass with each meal (3 times a day). OTC (over-the-counter) Magnesium 400 to 500 mg - Take 1 tablet twice a day (one with breakfast and one before bedtime). If you have kidney problems, talk to your primary  care physician before taking any Magnesium. Tonic Water with quinine - Take 1, 8 oz glass before bedtime.   ____________________________________________________________________________________________

## 2021-02-07 NOTE — Progress Notes (Signed)
Safety precautions to be maintained throughout the outpatient stay will include: orient to surroundings, keep bed in low position, maintain call bell within reach at all times, provide assistance with transfer out of bed and ambulation.  

## 2021-02-10 LAB — COMPLIANCE DRUG ANALYSIS, UR

## 2021-02-12 LAB — HEMOGLOBIN A1C
Est. average glucose Bld gHb Est-mCnc: 128 mg/dL
Hgb A1c MFr Bld: 6.1 % — ABNORMAL HIGH (ref 4.8–5.6)

## 2021-02-12 LAB — COMP. METABOLIC PANEL (12)
AST: 18 IU/L (ref 0–40)
Albumin/Globulin Ratio: 1.8 (ref 1.2–2.2)
Albumin: 4.2 g/dL (ref 3.7–4.7)
Alkaline Phosphatase: 123 IU/L — ABNORMAL HIGH (ref 44–121)
BUN/Creatinine Ratio: 11 (ref 10–24)
BUN: 10 mg/dL (ref 8–27)
Bilirubin Total: 0.5 mg/dL (ref 0.0–1.2)
Calcium: 9.2 mg/dL (ref 8.6–10.2)
Chloride: 103 mmol/L (ref 96–106)
Creatinine, Ser: 0.94 mg/dL (ref 0.76–1.27)
Globulin, Total: 2.4 g/dL (ref 1.5–4.5)
Glucose: 102 mg/dL — ABNORMAL HIGH (ref 65–99)
Potassium: 4.8 mmol/L (ref 3.5–5.2)
Sodium: 142 mmol/L (ref 134–144)
Total Protein: 6.6 g/dL (ref 6.0–8.5)
eGFR: 86 mL/min/{1.73_m2} (ref >59)

## 2021-02-12 LAB — 25-HYDROXY VITAMIN D LCMS D2+D3
25-Hydroxy, Vitamin D-2: <1 ng/mL
25-Hydroxy, Vitamin D-3: 22 ng/mL
25-Hydroxy, Vitamin D: 22 ng/mL — ABNORMAL LOW

## 2021-02-12 LAB — C-REACTIVE PROTEIN: CRP: 3 mg/L (ref 0–10)

## 2021-02-12 LAB — SEDIMENTATION RATE: Sed Rate: 6 mm/hr (ref 0–30)

## 2021-02-12 LAB — MAGNESIUM: Magnesium: 2.2 mg/dL (ref 1.6–2.3)

## 2021-02-12 LAB — VITAMIN B12: Vitamin B-12: 249 pg/mL (ref 232–1245)

## 2021-02-22 ENCOUNTER — Encounter (INDEPENDENT_AMBULATORY_CARE_PROVIDER_SITE_OTHER): Payer: Self-pay | Admitting: Ophthalmology

## 2021-02-22 ENCOUNTER — Ambulatory Visit (INDEPENDENT_AMBULATORY_CARE_PROVIDER_SITE_OTHER): Payer: Medicare Other | Admitting: Ophthalmology

## 2021-02-22 ENCOUNTER — Other Ambulatory Visit: Payer: Self-pay

## 2021-02-22 DIAGNOSIS — H33311 Horseshoe tear of retina without detachment, right eye: Secondary | ICD-10-CM

## 2021-02-22 DIAGNOSIS — H4311 Vitreous hemorrhage, right eye: Secondary | ICD-10-CM | POA: Diagnosis not present

## 2021-02-22 NOTE — Assessment & Plan Note (Signed)
The nature of retinal tears was discussed with the patient. Treatment options including laser retinopexy versus cryotherapy was discussed with the patient as well, as possible side effects including possible failure to prevent retinal detachment.  Other tears in other meridians or at the same site of repair could develop as the vitreous gel continues to contract and exert traction over time. All the patient's questions were answered. An informational note  was given to the patient.  Laser retinopexy 630 meridian break will be offered today

## 2021-02-22 NOTE — Progress Notes (Signed)
02/22/2021     CHIEF COMPLAINT Patient presents for Retina Evaluation (NP- retinal tear OD- Referred by Dr. Darnell Level. [place]/Pt states, "About two weeks ago I noticed a round circle in the center of my vision OS. I also am having a lot of flashes of light temporally. I have what looks like a glob of something floating in my OD as well. I have an almost constant pressure in my OD as well."/Pt does state that he has had a series of recent falls. [name]: 6.1/LBS: Does not check often/)   HISTORY OF PRESENT ILLNESS: [name] is a 75 y.o. male who presents to the clinic today for:   HPI     Retina Evaluation           Laterality: right eye   Onset: 2 weeks ago   Duration: 2 weeks   Associated Symptoms: Flashes, Floaters, Distortion, Blind Spot (A gray circle in the center of my vision) and Pain (I have a lot of pressure to the right of my right eye).  Negative for Trauma   Comments: NP- retinal tear OD- Referred by Dr. Nelva Bush Pt states, "About two weeks ago I noticed a round circle in the center of my vision OS. I also am having a lot of flashes of light temporally. I have what looks like a glob of something floating in my OD as well. I have an almost constant pressure in my OD as well." Pt does state that he has had a series of recent falls.  A1C: 6.1 LBS: Does not check often        Last edited by [name], COA on 02/22/2021 12:59 PM.      Referring physician: Adin Hector, MD [facility] [place],  [place] [address]  HISTORICAL INFORMATION:   Selected notes from the MEDICAL RECORD NUMBER    Lab Results  Component Value Date   HGBA1C 6.1 (H) 02/07/2021     CURRENT MEDICATIONS: No current outpatient medications on file. (Ophthalmic Drugs)   No current facility-administered medications for this visit. (Ophthalmic Drugs)   Current Outpatient Medications (Other)  Medication Sig   albuterol (PROAIR HFA) 108 (90 Base) MCG/ACT inhaler  Inhale into the lungs every 6 (six) hours as needed for wheezing or shortness of breath.   amitriptyline (ELAVIL) 100 MG tablet Take 100 mg by mouth at bedtime.   bisacodyl (DULCOLAX) 5 MG EC tablet Take 5 mg by mouth daily as needed for moderate constipation.   Bupivacaine HCl (BUPIVACAINE ON-Q PAIN PUMP, FOR ORDER SET NO CHG,) by Other route once. Intrathecal pain pump   cloNIDine (CATAPRES) 0.1 MG tablet Take 0.1 mg by mouth 2 (two) times daily.   DULoxetine (CYMBALTA) 60 MG capsule Take by mouth.   levothyroxine (SYNTHROID, LEVOTHROID) 75 MCG tablet Take 0.125 mcg by mouth daily before breakfast.   losartan (COZAAR) 25 MG tablet Take 25 mg by mouth daily.   Multiple Vitamin (MULTIVITAMIN) capsule Take 1 capsule by mouth daily.   naloxone (NARCAN) nasal spray 4 mg/0.1 mL Place into the nose.   pregabalin (LYRICA) 50 MG capsule Take 50 mg by mouth 3 (three) times daily.   promethazine (PHENERGAN) 12.5 MG tablet Take 12.5 mg by mouth every 6 (six) hours as needed for nausea or vomiting.   simvastatin (ZOCOR) 40 MG tablet Take 40 mg by mouth daily.   Tiotropium Bromide-Olodaterol (STIOLTO RESPIMAT IN) Inhale into the lungs.   No current  facility-administered medications for this visit. (Other)      REVIEW OF SYSTEMS:    ALLERGIES Allergies  Allergen Reactions   Codeine     unknown Other reaction(s): Unknown unknown   Levofloxacin Hives   Morphine And Related Nausea Only   Oxycontin [Oxycodone Hcl] Nausea Only   Penicillins Other (See Comments)    unknown Other reaction(s): Other (See Comments), Unknown unknown    PAST MEDICAL HISTORY Past Medical History:  Diagnosis Date   Anxiety    Arthritis    INFLAMMATORY POLYARTHROPATHY   Asthma    Cancer ([place])    CHF (congestive heart failure) (HCC)    COPD (chronic obstructive pulmonary disease) ([place])    Coronary artery disease    2000   Depression    Diabetes mellitus without complication ([place])    DVT of lower extremity,  bilateral (HCC)    Dysrhythmia    afib   Hypertension    Neuropathy    Pain    chronic syndrome   Peripheral vascular disease (HCC)    S/P IVC filter    removed   Sleep apnea    o2  2.5 l hs   Sleep disorder    Stasis dermatitis of both legs    Past Surgical History:  Procedure Laterality Date   BACK SURGERY     lumbar fusion   BLADDER TUMOR EXCISION     CERVICAL FUSION     COLON SURGERY     bowel blockage   COLONOSCOPY WITH PROPOFOL N/A 02/27/2018   Procedure: COLONOSCOPY WITH PROPOFOL;  Surgeon: Gabriel Silvas, MD;  Location: [facility] ENDOSCOPY;  Service: Endoscopy;  Laterality: N/A;   CORONARY ANGIOPLASTY     2000   INFUSION PUMP REPLACEMENT     INTRATHECAL PUMP IMPLANTATION     MASTOIDECTOMY     x 3   NISSEN FUNDOPLICATION     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   Procedure: TRANSURETHRAL RESECTION OF BLADDER TUMOR (TURBT);  Surgeon: Gabriel Cowper, MD;  Location: [facility] ORS;  Service: Urology;  Laterality: N/A;    FAMILY HISTORY Family History  Problem Relation Age of Onset   Heart disease Mother    Emphysema Father    Heart attack Father    Emphysema Sister    Heart disease Brother     SOCIAL HISTORY Social History   Tobacco Use   Smoking status: Every Day    Packs/day: 1.00    Types: Cigarettes   Smokeless tobacco: Never  Vaping Use   Vaping Use: Never used  Substance Use Topics   Alcohol use: No   Drug use: No         OPHTHALMIC EXAM:  Base Eye Exam     Visual Acuity (ETDRS)       Right Left   Dist [place] 20/30 20/30   Dist ph [place] 20/25 20/25    Correction: Glasses         Tonometry (Tonopen, 1:06 PM)       Right Left   Pressure 13 15         Pupils       Pupils Dark Light Shape React APD   Right PERRL 5 4 Round Brisk None   Left PERRL 5 4 Round Brisk None         Visual Fields (Counting fingers)       Left Right   Restrictions Partial outer inferior temporal deficiency Partial outer inferior temporal  deficiency  Extraocular Movement       Right Left    Full Full         Neuro/Psych     Oriented x3: Yes         Dilation     Both eyes: 1.0% Mydriacyl, 2.5% Phenylephrine @ 1:06 PM           Slit Lamp and Fundus Exam     External Exam       Right Left   External Normal Normal         Slit Lamp Exam       Right Left   Lids/Lashes Normal Normal   Conjunctiva/Sclera White and quiet White and quiet   Cornea Clear Clear   Anterior Chamber Deep and quiet Deep and quiet   Iris Round and reactive Round and reactive   Lens Clear Clear   Anterior Vitreous Normal Normal         Fundus Exam       Right Left   Posterior Vitreous Vitreous hemorrhage INF, Posterior vitreous detachment Normal   Disc Normal Normal   C/D Ratio 0.6 0.65   Macula Normal Normal   Vessels Normal Normal   Periphery Sclera depressed 28D, 20 d, horseshoe tear 6:30, 25M exam , Horseshoe tear, Preretinal hemorrhage Normal            IMAGING AND PROCEDURES  Imaging and Procedures for 02/22/21  OCT, Retina - OU - Both Eyes       Right Eye Quality was good. Central Foveal Thickness: 244. Findings include normal foveal contour.   Left Eye Quality was good. Central Foveal Thickness: 241. Findings include normal foveal contour.   Notes Mild to moderate vitreous debris centrally OD, PVD recent       Color Fundus Photography Optos - OU - Both Eyes       Right Eye Progression has no prior data. Disc findings include normal observations, increased cup to disc ratio. Macula : normal observations.   Left Eye Progression has no prior data. Disc findings include normal observations, increased cup to disc ratio. Macula : normal observations. Vessels : normal observations. Periphery : normal observations.   Notes Central vitreous debris and Peripheral inferior debris OD likely vitreous hemorrhage from retinal tear             ASSESSMENT/PLAN:  Retinal tear,  right The nature of retinal tears was discussed with the patient. Treatment options including laser retinopexy versus cryotherapy was discussed with the patient as well, as possible side effects including possible failure to prevent retinal detachment.  Other tears in other meridians or at the same site of repair could develop as the vitreous gel continues to contract and exert traction over time. All the patient's questions were answered. An informational note  was given to the patient.  Laser retinopexy 630 meridian break will be offered today     ICD-10-CM   1. Retinal tear, right  H33.311 Color Fundus Photography Optos - OU - Both Eyes    2. Vitreous hemorrhage of right eye (HCC)  H43.11 Color Fundus Photography Optos - OU - Both Eyes      1.  OD, retinal tear inferiorly.  Laser retinopexy applied today.  Risks and benefits have been reviewed.  2.  I explained to the patient that the floaters, from the vitreous hemorrhage are not going to change with this treatment today but instead will slowly dissolve and move out of the way.  3.  Has  been asked to contact us promptly with new onset profound worsening of his current baseline symptoms develops.  Particularly if of flashing lights worsen when the eye is at rest or if a curtain of darkness were to develop  Ophthalmic Meds Ordered this visit:  No orders of the defined types were placed in this encounter.      No follow-ups on file.  There are no Patient Instructions on file for this visit.   Explained the diagnoses, plan, and follow up with the patient and they expressed understanding.  Patient expressed understanding of the importance of proper follow up care.   Clent Demark Epifania Littrell M.D. Diseases & Surgery of the Retina and Vitreous Retina & Diabetic [place] 02/22/21     Abbreviations: M myopia (nearsighted); A astigmatism; H hyperopia (farsighted); P presbyopia; Mrx spectacle prescription;  CTL contact lenses; OD right eye;  OS left eye; OU both eyes  XT exotropia; ET esotropia; PEK punctate epithelial keratitis; PEE punctate epithelial erosions; DES dry eye syndrome; MGD meibomian gland dysfunction; ATs artificial tears; PFAT's preservative free artificial tears; [place] nuclear sclerotic cataract; PSC posterior subcapsular cataract; ERM epi-retinal membrane; PVD posterior vitreous detachment; RD retinal detachment; DM diabetes mellitus; DR diabetic retinopathy; NPDR non-proliferative diabetic retinopathy; PDR proliferative diabetic retinopathy; CSME clinically significant macular edema; DME diabetic macular edema; dbh dot blot hemorrhages; CWS cotton wool spot; POAG primary open angle glaucoma; C/D cup-to-disc ratio; HVF humphrey visual field; GVF goldmann visual field; OCT optical coherence tomography; IOP intraocular pressure; BRVO Branch retinal vein occlusion; CRVO central retinal vein occlusion; CRAO central retinal artery occlusion; BRAO branch retinal artery occlusion; RT retinal tear; SB scleral buckle; PPV pars plana vitrectomy; VH Vitreous hemorrhage; PRP panretinal laser photocoagulation; IVK intravitreal kenalog; VMT vitreomacular traction; MH Macular hole;  NVD neovascularization of the disc; NVE neovascularization elsewhere; AREDS age related eye disease study; ARMD age related macular degeneration; POAG primary open angle glaucoma; EBMD epithelial/anterior basement membrane dystrophy; ACIOL anterior chamber intraocular lens; IOL intraocular lens; PCIOL posterior chamber intraocular lens; Phaco/IOL phacoemulsification with intraocular lens placement; [place] photorefractive keratectomy; LASIK laser assisted in situ keratomileusis; HTN hypertension; DM diabetes mellitus; COPD chronic obstructive pulmonary disease

## 2021-04-02 ENCOUNTER — Telehealth: Payer: Self-pay | Admitting: *Deleted

## 2021-04-02 NOTE — Telephone Encounter (Signed)
Lolita from xray called and is asking about imaging for this patient.  The patient was seen on 02/07/21 at which time several images were ordered.  All have expired with the exception of bilateral knees and were d/c'd.  Explained that FN is not here today so we can not fix the issue.  Also, there was instructions to have these done ASAP as the orders would expire after 30 days of order.  Xray tech, states that the knees have not been released and this maybe the reason these still remain.

## 2021-04-03 ENCOUNTER — Other Ambulatory Visit: Payer: Self-pay | Admitting: Pain Medicine

## 2021-04-03 ENCOUNTER — Ambulatory Visit: Admission: RE | Admit: 2021-04-03 | Payer: Medicare Other | Source: Ambulatory Visit

## 2021-04-03 DIAGNOSIS — Z462 Encounter for fitting and adjustment of other devices related to nervous system and special senses: Secondary | ICD-10-CM

## 2021-04-03 DIAGNOSIS — M5417 Radiculopathy, lumbosacral region: Secondary | ICD-10-CM

## 2021-04-03 DIAGNOSIS — M25572 Pain in left ankle and joints of left foot: Secondary | ICD-10-CM

## 2021-04-03 DIAGNOSIS — M961 Postlaminectomy syndrome, not elsewhere classified: Secondary | ICD-10-CM

## 2021-04-03 DIAGNOSIS — M25562 Pain in left knee: Secondary | ICD-10-CM

## 2021-04-03 DIAGNOSIS — M79604 Pain in right leg: Secondary | ICD-10-CM

## 2021-04-03 DIAGNOSIS — Z978 Presence of other specified devices: Secondary | ICD-10-CM

## 2021-04-03 DIAGNOSIS — G8929 Other chronic pain: Secondary | ICD-10-CM

## 2021-04-04 ENCOUNTER — Telehealth: Payer: Self-pay

## 2021-04-04 NOTE — Telephone Encounter (Signed)
Notified patient that Dr Dossie Arbour has re-entered the xrays so he can go and do them today.

## 2021-04-05 ENCOUNTER — Encounter (INDEPENDENT_AMBULATORY_CARE_PROVIDER_SITE_OTHER): Payer: Medicare Other | Admitting: Ophthalmology

## 2021-04-06 ENCOUNTER — Ambulatory Visit
Admission: RE | Admit: 2021-04-06 | Discharge: 2021-04-06 | Disposition: A | Discharge status: Home / Self Care | Payer: Medicare Other | Attending: Pain Medicine | Admitting: Pain Medicine

## 2021-04-06 ENCOUNTER — Ambulatory Visit
Admission: RE | Admit: 2021-04-06 | Discharge: 2021-04-06 | Disposition: A | Discharge status: Home / Self Care | Payer: Medicare Other | Source: Ambulatory Visit | Attending: Pain Medicine | Admitting: Pain Medicine

## 2021-04-06 ENCOUNTER — Other Ambulatory Visit: Payer: Self-pay | Admitting: Pain Medicine

## 2021-04-06 DIAGNOSIS — M5417 Radiculopathy, lumbosacral region: Secondary | ICD-10-CM

## 2021-04-06 DIAGNOSIS — M25562 Pain in left knee: Secondary | ICD-10-CM | POA: Insufficient documentation

## 2021-04-06 DIAGNOSIS — M961 Postlaminectomy syndrome, not elsewhere classified: Secondary | ICD-10-CM

## 2021-04-06 DIAGNOSIS — M79605 Pain in left leg: Secondary | ICD-10-CM | POA: Diagnosis present

## 2021-04-06 DIAGNOSIS — M25571 Pain in right ankle and joints of right foot: Secondary | ICD-10-CM | POA: Diagnosis present

## 2021-04-06 DIAGNOSIS — G8929 Other chronic pain: Secondary | ICD-10-CM

## 2021-04-06 DIAGNOSIS — M25572 Pain in left ankle and joints of left foot: Secondary | ICD-10-CM | POA: Diagnosis present

## 2021-04-06 DIAGNOSIS — M25561 Pain in right knee: Secondary | ICD-10-CM | POA: Insufficient documentation

## 2021-04-06 DIAGNOSIS — M5442 Lumbago with sciatica, left side: Secondary | ICD-10-CM | POA: Diagnosis not present

## 2021-04-06 DIAGNOSIS — M79604 Pain in right leg: Secondary | ICD-10-CM | POA: Insufficient documentation

## 2021-04-06 DIAGNOSIS — Z978 Presence of other specified devices: Secondary | ICD-10-CM | POA: Insufficient documentation

## 2021-04-06 DIAGNOSIS — M5441 Lumbago with sciatica, right side: Secondary | ICD-10-CM | POA: Diagnosis present

## 2021-04-06 DIAGNOSIS — M79672 Pain in left foot: Secondary | ICD-10-CM | POA: Insufficient documentation

## 2021-04-06 DIAGNOSIS — M79671 Pain in right foot: Secondary | ICD-10-CM | POA: Insufficient documentation

## 2021-04-07 NOTE — Progress Notes (Signed)
PROVIDER NOTE: Information contained herein reflects review and annotations entered in association with encounter. Interpretation of such information and data should be left to medically-trained personnel. Information provided to patient can be located elsewhere in the medical record under "Patient Instructions". Document created using STT-dictation technology, any transcriptional errors that may result from process are unintentional.    Patient: [name]  Service Category: E/M  Provider: [name], MD  DOB: 1946/02/16  DOS: 04/09/2021  Specialty: Interventional Pain Management  MRN: [id number]  Setting: Ambulatory outpatient  PCP: Adin Hector, MD  Type: Established Patient    Referring Provider: Adin Hector, MD  Location: Office  Delivery: Face-to-face     Primary Reason(s) for Visit: Encounter for evaluation before starting new chronic pain management plan of care (Level of risk: moderate) CC: Back Pain (Lower/)  HPI  [name] is Gabriel 75 y.o. year old, male patient, who comes today for Gabriel follow-up evaluation to review the test results and decide on Gabriel treatment plan. He has Hypertension; Type 2 diabetes mellitus ([place]); Stasis dermatitis of both legs; Chronic lower extremity pain (2ry area of Pain) (Bilateral) (R>L); Chronic venous insufficiency; Chronic coronary artery disease; Depressive disorder; Solitary pulmonary nodule; Acquired hypothyroidism; Adenomatous colon polyp; Anxiety; B12 deficiency; Blepharitis; Blood in urine; Calculus of kidney; Chronic obstructive pulmonary disease ([place]); Chronic pain syndrome; Chronic prostatitis; Chronic systolic CHF (congestive heart failure) ([place]); Chronic, continuous use of opioids; Encounter for screening for malignant neoplasm of respiratory organs; Encounter for monitoring opioid maintenance therapy; Flatulence, eructation and gas pain; GERD (gastroesophageal reflux disease); Hemangioma; Hx of deep venous thrombosis; Hyperlipidemia;  Irritable bowel syndrome; Chronic low back pain (1ry area of Pain) (Bilateral) (R>L) w/ sciatica (Bilateral); Lumbar post-laminectomy syndrome; 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[place]); Chronic ankle pain (Bilateral); Pain in joint, ankle and foot; Paroxysmal atrial fibrillation ([place]); Polyneuropathy associated with underlying disease ([place]); Presence of intrathecal pump; Sebaceous cyst; Senile purpura ([place]); 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, parasomnia type; Smoking; Tobacco dependence syndrome; Type 2 diabetes mellitus with complication, without long-term current use of insulin ([place]); Pharmacologic therapy; Disorder of skeletal system; Problems influencing health status; Lumbosacral radiculopathy at S1 (Bilateral) (R>L); Chronic feet pain (3ry area of Pain) (Bilateral) (R>L); Chronic knee pain (4th area of Pain) (Bilateral) (R>L); Failed back surgical syndrome; Lower extremity weakness (Bilateral); Chronic neuropathic pain; Peripheral neurogenic pain; Nonfamilial nocturnal leg cramps (Bilateral); Restless leg syndrome; Malfunction of intrathecal infusion pump; End of battery life of intrathecal infusion pump; Retinal tear, right; and Vitreous hemorrhage of right eye (HCC) on their problem list. His primarily concern today is the Back Pain (Lower/)  Pain Assessment: Location:   Back Radiating: pain radiaities down both leg to his feet, left side is the worse Onset: More than Gabriel month ago Duration: Chronic pain Quality: Sharp, Stabbing, Aching, Dull Severity: 8 /10 (subjective, self-reported pain score)  Effect on ADL: limits my daily activities Timing: Constant Modifying factors: nothing BP: (!) 130/96  HR: 33  [name] comes in today for Gabriel follow-up visit after his initial evaluation on 04/04/2021. Today we went over the results of his tests. These were explained in "Layman's terms". During today's appointment we went over my diagnostic impression, as well as the proposed  treatment plan.  [name], Gabriel long time ago.  His primary area of pain is that of the lower back (Bilateral) (R>L).  He has Gabriel history positive for  3 back surgeries with the last 1 having been done around 1999.  The injury to his back came about as Gabriel consequence of Gabriel work-related accident.  He has had multiple nerve blocks over his lifetime, he has also had physical therapy, and it has been more than 2 years since his last x-rays.  The patient was being managed at the [facility] pain clinic where he had his initial intrathecal pump (which I implanted) replaced by Boortz-Marx, Genella Mech, MD  .  Unfortunately, the catheter placement on the pump was not in optimal location and he never attained the same type of relief that he did while we were taking care of his intrathecal pump.  Because it was not working for him, they decided to start tapering down the medication and the last time that the intrathecal pump was accessed was around January 2022.  He indicates that at that time he asked him to switch the medication to sterile normal saline solution, which they did.  Soon after that, around the time of his birthday, 09/11/2018 he was admitted to the hospital with severe withdrawals.  His pain physician retired from Network engineer at [employer] and he was taken off of his Valium as well as most of his medications.  He is currently not taking any.   The patient's secondary area of pain is that of the lower extremities (Bilateral) (R> L).  In the case of his right lower extremity the pain runs down the back of the leg all the way down into the bottom of his foot and what seems to be an S1 dermatomal distribution.  In the case of the left lower extremity the pattern is exactly the same, but just not as bad.  He is experiencing significant weakness of both lower extremities and he refers that he has not had Gabriel nerve conduction test to evaluate this.  In addition, the patient has being having significant lower  extremity spasms and night.  He indicates that these keep him from being able to sleep.   The patient's third area pain is that of his feet (Bilateral) (R>L).  Denies any surgeries and he does have Gabriel history of non-insulin-dependent diabetes mellitus.  He denies any nerve conduction test.   The fourth area pain is that of the knees (Bilateral) (R>L).  The patient denies any surgeries, joint injections, physical therapy, or recent x-rays.   Pharmacotherapy: He is currently on Lyrica 50 mg p.o. 3 times daily and he was given clonidine 0.1 mg p.o. twice daily when he had his Valium stopped.  They told him that the clonidine was for his panic attacks, which I recently learned that he does have some degree of effectiveness in bringing down anxiety.  The patient previously was on hydromorphone, but his wife indicates that while he was on the Lyrica and the hydromorphone he would have Gabriel lot more falls since he seems to be stumbling.  The patient was recently seen by Dr. Lacinda Axon who is pending further evaluation on the intrathecal pump.  It would seem that the catheter tip is in the correct area however, there is Gabriel question as to whether or not this catheter is intact or if there are some problems with it.  I will be bringing the patient back to do an intrathecal catheter study and then the same time I will take you proceeded to go ahead and do Gabriel left-sided lumbar epidural steroid injection since he seems to be having Gabriel flareup of his low back  and lower extremity radicular symptoms that seem to be affecting the left L5 dermatome.  In considering the treatment plan options, [name] was reminded that I no longer take patients for medication management only. I asked him to let me know if he had no intention of taking advantage of the interventional therapies, so that we could make arrangements to provide this space to someone interested. I also made it clear that undergoing interventional therapies for the purpose of  getting pain medications is very inappropriate on the part of Gabriel patient, and it will not be tolerated in this practice. This type of behavior would suggest true addiction and therefore it requires referral to an addiction specialist.   Further details on both, my assessment(s), as well as the proposed treatment plan, please see below.  Controlled Substance Pharmacotherapy Assessment REMS (Risk Evaluation and Mitigation Strategy)  Analgesic: Hydromorphone 4 mg tablet, 1 tab p.o. 3 times daily (last filled on 04/02/2021) MME/day: 0 mg/day   Pill Count: None expected due to no prior prescriptions written by our practice. Chauncey Fischer, RN  04/09/2021  2:41 PM  Sign when Signing Visit Nursing Pain Medication Assessment:  Safety precautions to be maintained throughout the outpatient stay will include: orient to surroundings, keep bed in low position, maintain call bell within reach at all times, provide assistance with transfer out of bed and ambulation.  Medication Inspection Compliance: Pill count conducted under aseptic conditions, in front of the patient. Neither the pills nor the bottle was removed from the patient's sight at any time. Once count was completed pills were immediately returned to the patient in their original bottle.  Medication: Hydromorphone (Dilaudid) Pill/Patch Count:  55 of 90 pills remain Pill/Patch Appearance: Markings consistent with prescribed medication Bottle Appearance: Standard pharmacy container. Clearly labeled. Filled Date: 13 / 12 / 2022 Last Medication intake:  Today Safety precautions to be maintained throughout the outpatient stay will include: orient to surroundings, keep bed in low position, maintain call bell within reach at all times, provide assistance with transfer out of bed and ambulation.     Pharmacokinetics: Liberation and absorption (onset of action): WNL Distribution (time to peak effect): WNL Metabolism and excretion (duration of action): WNL          Pharmacodynamics: Desired effects: Analgesia: [name] reports >50% benefit. Functional ability: Patient reports that medication allows him to accomplish basic ADLs Clinically meaningful improvement in function (CMIF): Sustained CMIF goals met Perceived effectiveness: Described as relatively effective, allowing for increase in activities of daily living (ADL) Undesirable effects: Side-effects or Adverse reactions: None reported Monitoring: [place] PMP: PDMP reviewed during this encounter. Online review of the past 63-month period previously conducted. Not applicable at this point since we have not taken over the patient's medication management yet. List of other Serum/Urine Drug Screening Test(s):  No results found for: AMPHSCRSER, BARBSCRSER, BENZOSCRSER, COCAINSCRSER, COCAINSCRNUR, PCPSCRSER, THCSCRSER, THCU, CANNABQUANT, [place], [place], [place], [place] List of all UDS test(s) done:  Lab Results  Component Value Date   SUMMARY Note 02/07/2021   Last UDS on record: Summary  Date Value Ref Range Status  02/07/2021 Note  Final    Comment:    ==================================================================== Compliance Drug Analysis, Ur ==================================================================== Test                             Result       Flag       Units  Drug Present and Declared  for Prescription Verification   Pregabalin                     PRESENT      EXPECTED   Amitriptyline                  PRESENT      EXPECTED   Nortriptyline                  PRESENT      EXPECTED    Nortriptyline is an expected metabolite of amitriptyline.  Drug Absent but Declared for Prescription Verification   Duloxetine                     Not Detected UNEXPECTED   Promethazine                   Not Detected UNEXPECTED   Bupivacaine                    Not Detected UNEXPECTED   Clonidine                      Not Detected  UNEXPECTED ==================================================================== Test                      Result    Flag   Units      Ref Range   Creatinine              108              mg/dL      >=20 ==================================================================== Declared Medications:  The flagging and interpretation on this report are based on the  following declared medications.  Unexpected results may arise from  inaccuracies in the declared medications.   **Note: The testing scope of this panel includes these medications:   Amitriptyline (Elavil)  Bupivacaine  Clonidine (Catapres)  Duloxetine (Cymbalta)  Pregabalin (Lyrica)  Promethazine (Phenergan)   **Note: The testing scope of this panel does not include the  following reported medications:   Albuterol (Ventolin HFA)  Bisacodyl  Levothyroxine (Synthroid)  Losartan (Cozaar)  Multivitamin  Naloxone (Narcan)  Simvastatin (Zocor) ==================================================================== For clinical consultation, please call [phone]. ====================================================================    UDS interpretation: No unexpected findings.          Medication Assessment Form: Patient introduced to form today Treatment compliance: Treatment may start today if patient agrees with proposed plan. Evaluation of compliance is not applicable at this point Risk Assessment Profile: Aberrant behavior: See initial evaluations. None observed or detected today Comorbid factors increasing risk of overdose: See initial evaluation. No additional risks detected today Opioid risk tool (ORT):  Opioid Risk  02/07/2021  Alcohol 0  Illegal Drugs 0  Rx Drugs 0  Alcohol 0  Illegal Drugs 0  Rx Drugs 0  Age between 16-45 years  0  History of Preadolescent Sexual Abuse 0  Psychological Disease 0  Depression 0  Opioid Risk Tool Scoring 0  Opioid Risk Interpretation Low Risk    ORT Scoring interpretation  table:  Score <3 = Low Risk for SUD  Score between 4-7 = Moderate Risk for SUD  Score >8 = High Risk for Opioid Abuse   Risk of substance use disorder (SUD): Low  Risk Mitigation Strategies:  Patient opioid safety counseling: Completed today. Counseling provided to patient as per "Patient Counseling Document". Document signed by patient, attesting to counseling and understanding  Patient-Prescriber Agreement (PPA): Obtained today.  Controlled substance notification to other providers: Written and sent today.  Pharmacologic Plan: Today we may be taking over the patient's pharmacological regimen. See below.             Laboratory Chemistry Profile   Renal Lab Results  Component Value Date   BUN 10 02/07/2021   CREATININE 0.94 02/07/2021   BCR 11 02/07/2021   GFRAA >60 03/02/2015   GFRNONAA >60 10/04/2020   PROTEINUR NEGATIVE 10/04/2020     Electrolytes Lab Results  Component Value Date   NA 142 02/07/2021   K 4.8 02/07/2021   CL 103 02/07/2021   CALCIUM 9.2 02/07/2021   MG 2.2 02/07/2021     Hepatic Lab Results  Component Value Date   AST 18 02/07/2021   ALBUMIN 4.2 02/07/2021   ALKPHOS 123 (H) 02/07/2021     ID No results found for: LYMEIGGIGMAB, HIV, SARSCOV2NAA, STAPHAUREUS, MRSAPCR, HCVAB, PREGTESTUR, RMSFIGG, QFVRPH1IGG, QFVRPH2IGG, LYMEIGGIGMAB   Bone Lab Results  Component Value Date   25OHVITD1 22 (L) 02/07/2021   25OHVITD2 <1.0 02/07/2021   25OHVITD3 22 02/07/2021     Endocrine Lab Results  Component Value Date   GLUCOSE 102 (H) 02/07/2021   GLUCOSEU NEGATIVE 10/04/2020   HGBA1C 6.1 (H) 02/07/2021     Neuropathy Lab Results  Component Value Date   VITAMINB12 249 02/07/2021   HGBA1C 6.1 (H) 02/07/2021     CNS No results found for: COLORCSF, APPEARCSF, RBCCOUNTCSF, WBCCSF, POLYSCSF, LYMPHSCSF, EOSCSF, PROTEINCSF, GLUCCSF, JCVIRUS, CSFOLI, IGGCSF, LABACHR, ACETBL, LABACHR, ACETBL   Inflammation (CRP: Acute  ESR: Chronic) Lab Results   Component Value Date   CRP 3 02/07/2021   ESRSEDRATE 6 02/07/2021     Rheumatology No results found for: RF, ANA, LABURIC, URICUR, LYMEIGGIGMAB, LYMEABIGMQN, HLAB27   Coagulation Lab Results  Component Value Date   INR 0.96 03/07/2015   LABPROT 13.0 03/07/2015   PLT 229 10/04/2020     Cardiovascular Lab Results  Component Value Date   HGB 14.4 10/04/2020   HCT 45.7 10/04/2020     Screening No results found for: SARSCOV2NAA, COVIDSOURCE, STAPHAUREUS, MRSAPCR, HCVAB, HIV, PREGTESTUR   Cancer No results found for: CEA, CA125, LABCA2   Allergens No results found for: ALMOND, APPLE, ASPARAGUS, AVOCADO, BANANA, BARLEY, BASIL, BAYLEAF, GREENBEAN, LIMABEAN, WHITEBEAN, BEEFIGE, REDBEET, BLUEBERRY, BROCCOLI, CABBAGE, MELON, CARROT, CASEIN, CASHEWNUT, CAULIFLOWER, CELERY     Note: Lab results reviewed.  Recent Diagnostic Imaging Review  Cervical Imaging: Cervical CT wo contrast: Results for orders placed during the hospital encounter of 08/26/19 CT CERVICAL SPINE WO CONTRAST  Narrative CLINICAL DATA:  75 year old male with fall and headache.  EXAM: CT HEAD WITHOUT CONTRAST  CT CERVICAL SPINE WITHOUT CONTRAST  TECHNIQUE: Multidetector CT imaging of the head and cervical spine was performed following the standard protocol without intravenous contrast. Multiplanar CT image reconstructions of the cervical spine were also generated.  COMPARISON:  Head CT dated 11/07/2009 and MRI dated 08/09/2017.  FINDINGS: CT HEAD FINDINGS  Brain: The ventricles and sulci appropriate size for patient's age. The gray-white matter discrimination is preserved. There is no acute intracranial hemorrhage. No mass effect or midline shift. No extra-axial fluid collection.  Vascular: No hyperdense vessel or unexpected calcification.  Skull: Normal. Negative for fracture or focal lesion.  Sinuses/Orbits: Mild diffuse mucoperiosteal thickening of paranasal sinuses. No air-fluid level. Prior  right mastectomy.  Other: There is Gabriel focal area of skin thickening over the right parietal calvarium (series 2 image 12) measuring  approximately 4 mm in thickness and 17 mm in length. This may represent an area of scarring. Correlation with clinical exam is recommended.  CT CERVICAL SPINE FINDINGS  Alignment: No acute subluxation. There is grade 1 C3-C4 and C4-C5 anterolisthesis.  Skull base and vertebrae: No acute fracture. No primary bone lesion or focal pathologic process.  Soft tissues and spinal canal: No prevertebral fluid or swelling. No visible canal hematoma.  Disc levels: Degenerative changes primarily at C5-C6 and C6-C7 with endplate irregularity and disc space narrowing and osteophyte. There is associated mild neural foramina at these levels secondary to disc osteophyte complex. There is facet arthropathy at C3-C4 on the right and C4-C5 left greater right.  Upper chest: Emphysema.  Other: Bilateral carotid bulb calcified plaques.  IMPRESSION: 1. Unremarkable noncontrast CT of the brain for age. 2. No acute/traumatic cervical spine pathology. 3. Focal area of skin thickening over the right parietal scalp.   Electronically Signed By: Anner Crete M.D. On: 08/26/2019 16:38  Lumbosacral Imaging: Lumbar DG Bending views: Results for orders placed during the hospital encounter of 04/06/21 DG Lumbar Spine Complete W/Bend  Narrative CLINICAL DATA:  Low back pain  EXAM: THORACIC SPINE 2 VIEWS; LUMBAR SPINE - COMPLETE WITH BENDING VIEWS  COMPARISON:  CT abdomen pelvis 12/11/2020  FINDINGS: Twelve rib-bearing thoracic vertebral bodies are noted. Five non-rib-bearing lumbar vertebral bodies are noted.  There is no evidence of thoracolumbar spine fracture. Multilevel degenerative changes of the spine with osteophyte formation, facet arthropathy, intervertebral disc space narrowing. Alignment of the thoracic spine is normal. There is similar-appearing grade  1 anterolisthesis of L4 on L5. No other significant bone abnormalities are identified.  Right neural stimulator overlying the iliac bone. Leads course superiorly with tip overlying the central canal at the level of the T8-T9 intervertebral disc space. The lead is noted along the anterior central canal and then courses to the posterior side.  IVC filter overlying the right abdomen at the level of the L2-L3 vertebral body.  Aortic calcifications.  IMPRESSION: 1. No acute displaced fracture or traumatic listhesis of the thoracolumbar spine in Gabriel patient with multilevel degenerative changes and similar-appearing grade 1 anterolisthesis of L4 on L5. 2. Inferior vena cava filter. 3.  Aortic Atherosclerosis (ICD10-I70.0).   Electronically Signed By: Iven Finn M.D. On: 04/08/2021 21:31     Catheter fracture?  Knee Imaging: Knee-R DG 4 views: Results for orders placed during the hospital encounter of 04/06/21 DG Knee Complete 4 Views Right  Narrative CLINICAL DATA:  Right knee pain/arthralgia  EXAM: RIGHT KNEE - COMPLETE 4+ VIEW  COMPARISON:  None.  FINDINGS: No evidence of fracture, dislocation, or joint effusion. No evidence of severe arthropathy. No aggressive appearing focal bone abnormality. Soft tissues are unremarkable. Vascular calcifications.  IMPRESSION: No acute displaced fracture or dislocation.   Electronically Signed By: Iven Finn M.D. On: 04/08/2021 20:54  Knee-L DG 4 views: Results for orders placed during the hospital encounter of 04/06/21 DG Knee Complete 4 Views Left  Narrative CLINICAL DATA:  Left knee pain/arthralgia  EXAM: LEFT KNEE - COMPLETE 4+ VIEW  COMPARISON:  None.  FINDINGS: No evidence of fracture, dislocation, or joint effusion. No evidence of severe arthropathy. No aggressive appearing focal bone abnormality. Soft tissues are unremarkable. Vascular calcifications.  IMPRESSION: No acute displaced fracture or  dislocation.   Electronically Signed By: Iven Finn M.D. On: 04/08/2021 20:54  Ankle Imaging: Ankle-R DG Complete: Results for orders placed during the hospital encounter of 04/06/21 DG Ankle  Complete Right  Narrative CLINICAL DATA:  Right ankle pain  EXAM: RIGHT ANKLE - COMPLETE 3+ VIEW  COMPARISON:  None.  FINDINGS: No evidence of fracture, dislocation, or joint effusion. Cortical irregularity along the anterior talus likely represents degenerative changes. Plantar calcaneal spur. No evidence of severe arthropathy. No aggressive appearing focal bone abnormality. Soft tissues are unremarkable.  IMPRESSION: No acute displaced fracture or dislocation.   Electronically Signed By: Iven Finn M.D. On: 04/08/2021 21:12  Ankle-L DG Complete: Results for orders placed during the hospital encounter of 04/06/21 DG Ankle Complete Left  Narrative CLINICAL DATA:  Left ankle pain.  EXAM: LEFT ANKLE COMPLETE - 3+ VIEW  COMPARISON:  None.  FINDINGS: No evidence of fracture, dislocation, or joint effusion. Posterior and plantar calcaneal spurs. No evidence of severe arthropathy. No aggressive appearing focal bone abnormality. Soft tissues are unremarkable.  IMPRESSION: No acute displaced fracture or dislocation.   Electronically Signed By: Iven Finn M.D. On: 04/08/2021 21:14  Foot Imaging: Foot-R DG Complete: Results for orders placed during the hospital encounter of 04/06/21 DG Foot Complete Right  Narrative CLINICAL DATA:  Right foot pain.  EXAM: RIGHT FOOT COMPLETE - 3+ VIEW  COMPARISON:  None.  FINDINGS: Diffusely decreased bone density. No evidence of fracture, dislocation, or joint effusion. No evidence of severe arthropathy. No aggressive appearing focal bone abnormality. Soft tissues are unremarkable.  IMPRESSION: No acute displaced fracture or dislocation in Gabriel patient with diffusely decreased bone density.   Electronically  Signed By: Iven Finn M.D. On: 04/08/2021 20:55  Foot-L DG Complete: Results for orders placed during the hospital encounter of 04/06/21 DG Foot Complete Left  Narrative CLINICAL DATA:  Left foot pain  EXAM: LEFT FOOT - COMPLETE 3+ VIEW  COMPARISON:  None.  FINDINGS: Diffusely decreased bone density. Plantar and posterior calcaneal spur. No evidence of fracture, dislocation, or joint effusion. No evidence of severe arthropathy. No aggressive appearing focal bone abnormality. Soft tissues are unremarkable.  IMPRESSION: No acute displaced fracture or dislocation in Gabriel patient with likely diffusely decreased bone density.   Electronically Signed By: Iven Finn M.D. On: 04/08/2021 20:56  Complexity Note: Imaging results reviewed. Results shared with [name], using Layman's terms.                        Meds   Current Outpatient Medications:    albuterol (VENTOLIN HFA) 108 (90 Base) MCG/ACT inhaler, Inhale into the lungs every 6 (six) hours as needed for wheezing or shortness of breath., Disp: , Rfl:    albuterol (VENTOLIN) (5 MG/ML) 0.5% NEBU, Inhale into the lungs., Disp: , Rfl:    Albuterol Sulfate (PROAIR RESPICLICK) 111 (90 Base) MCG/ACT AEPB, Inhale into the lungs., Disp: , Rfl:    amitriptyline (ELAVIL) 100 MG tablet, Take 100 mg by mouth at bedtime., Disp: , Rfl:    azelastine (ASTELIN) 0.1 % nasal spray, USE 1 SPRAY IN EACH NOSTRIL TWICE DAILY AS NEEDED FOR RHINITIS, Disp: , Rfl:    bisacodyl (DULCOLAX) 5 MG EC tablet, Take 5 mg by mouth daily as needed for moderate constipation., Disp: , Rfl:    Cholecalciferol 25 MCG (1000 UT) tablet, TAKE TWO TABLETS BY MOUTH ONCE EVERY DAY, Disp: , Rfl:    DULoxetine (CYMBALTA) 60 MG capsule, TAKE ONE CAPSULE BY MOUTH ONCE EVERY DAY, Disp: , Rfl:    levothyroxine (SYNTHROID) 125 MCG tablet, Take 125 mcg by mouth daily., Disp: , Rfl:    levothyroxine (  SYNTHROID, LEVOTHROID) 75 MCG tablet, Take 0.125 mcg by mouth daily  before breakfast., Disp: , Rfl:    losartan (COZAAR) 50 MG tablet, Take 1 tablet by mouth daily., Disp: , Rfl:    Multiple Vitamin (MULTIVITAMIN ADULT PO), Take 1 capsule by mouth daily., Disp: , Rfl:    naloxone (NARCAN) nasal spray 4 mg/0.1 mL, Place into the nose., Disp: , Rfl:    pregabalin (LYRICA) 75 MG capsule, Take by mouth., Disp: , Rfl:    promethazine (PHENERGAN) 12.5 MG tablet, Take 12.5 mg by mouth every 6 (six) hours as needed for nausea or vomiting., Disp: , Rfl:    promethazine (PHENERGAN) 25 MG tablet, TAKE ONE-HALF TABLET BY MOUTH EVERY 6 HOURS AS NEEDED, Disp: , Rfl:    Tiotropium Bromide-Olodaterol (STIOLTO RESPIMAT IN), Inhale into the lungs., Disp: , Rfl:    Vitamin E Acetate 1 UNIT/MG LIQD, Take by mouth., Disp: , Rfl:    [START ON 05/02/2021] HYDROmorphone (DILAUDID) 4 MG tablet, Take 1 tablet (4 mg total) by mouth every 8 (eight) hours as needed. Each refill must last 30 days., Disp: 90 tablet, Rfl: 0   [START ON 06/01/2021] HYDROmorphone (DILAUDID) 4 MG tablet, Take 1 tablet (4 mg total) by mouth every 8 (eight) hours as needed. Each refill must last 30 days., Disp: 90 tablet, Rfl: 0   simvastatin (ZOCOR) 40 MG tablet, Take 50 mg by mouth daily., Disp: , Rfl:   ROS  Constitutional: Denies any fever or chills Gastrointestinal: No reported hemesis, hematochezia, vomiting, or acute GI distress Musculoskeletal: Denies any acute onset joint swelling, redness, loss of ROM, or weakness Neurological: No reported episodes of acute onset apraxia, aphasia, dysarthria, agnosia, amnesia, paralysis, loss of coordination, or loss of consciousness  Allergies  [name] is allergic to codeine, levofloxacin, morphine and related, oxycontin [oxycodone hcl], and penicillins.  PFSH  Drug: [name]  reports no history of drug use. Alcohol:  reports no history of alcohol use. Tobacco:  reports that he has been smoking cigarettes. He has been smoking an average of 1 pack per day. He has  never [name] smokeless tobacco. Medical:  has Gabriel past medical history of Anxiety, Arthritis, Asthma, Cancer ([place]), CHF (congestive heart failure) ([place]), COPD (chronic obstructive pulmonary disease) ([place]), Coronary artery disease, Depression, Diabetes mellitus without complication ([place]), DVT of lower extremity, bilateral ([place]), Dysrhythmia, Hypertension, Neuropathy, Pain, Peripheral vascular disease ([place]), S/P IVC filter, Sleep apnea, Sleep disorder, and Stasis dermatitis of both legs. Surgical: [name]  has Gabriel past surgical history that includes Coronary angioplasty; Back surgery; Nissen fundoplication; Bladder tumor excision; Mastoidectomy; Colon surgery; Cervical fusion; Intrathecal pump implantation; Infusion pump replacement; Transurethral resection of bladder tumor (N/Gabriel, 03/07/2015); and Colonoscopy with propofol (N/Gabriel, 02/27/2018). Family: family history includes Emphysema in his father and sister; Heart attack in his father; Heart disease in his brother and mother.  Constitutional Exam  General appearance: Well nourished, well developed, and well hydrated. In no apparent acute distress Vitals:   04/09/21 1419  BP: (!) 130/96  Pulse: 78  Resp: 15  Temp: (!) 97.2 F (36.2 C)  SpO2: 92%  Weight: 215 lb (97.5 kg)  Height: $Remove'6\' 2"'CUKNtaS$  (1.88 m)   BMI Assessment: Estimated body mass index is 27.6 kg/m as calculated from the following:   Height as of this encounter: $RemoveBeforeD'6\' 2"'[id number]$  (1.88 m).   Weight as of this encounter: 215 lb (97.5 kg).  BMI interpretation table: BMI level Category Range association with higher incidence of chronic pain  <  18 kg/m2 Underweight   18.5-24.9 kg/m2 Ideal body weight   25-29.9 kg/m2 Overweight Increased incidence by 20%  30-34.9 kg/m2 Obese (Class I) Increased incidence by 68%  35-39.9 kg/m2 Severe obesity (Class II) Increased incidence by 136%  >40 kg/m2 Extreme obesity (Class III) Increased incidence by 254%   Patient's current BMI Ideal Body weight  Body mass index is 27.6  kg/m. Ideal body weight: 82.2 kg (181 lb 3.5 oz) Adjusted ideal body weight: 88.3 kg (194 lb 11.7 oz)   BMI Readings from Last 4 Encounters:  04/09/21 27.60 kg/m  02/07/21 26.25 kg/m  10/04/20 26.71 kg/m  09/08/20 25.94 kg/m   Wt Readings from Last 4 Encounters:  04/09/21 215 lb (97.5 kg)  02/07/21 210 lb (95.3 kg)  10/04/20 208 lb (94.3 kg)  09/08/20 202 lb (91.6 kg)    Psych/Mental status: Alert, oriented x 3 (person, place, & time)       Eyes: PERLA Respiratory: No evidence of acute respiratory distress  Assessment & Plan  Primary Diagnosis & Pertinent Problem List: The primary encounter diagnosis was Chronic pain syndrome. 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  Visit Diagnosis: 1. Chronic pain syndrome   2. 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 Chronic lower extremity pain (2ry area of Pain) (Bilateral) (R>L)   4. Chronic feet pain (3ry area of Pain) (Bilateral) (R>L)   5. Chronic knee pain (4th area of Pain) (Bilateral) (R>L)   6. Failed back surgical syndrome   7. Lumbar post-laminectomy syndrome   8. Presence of intrathecal pump   9. Malfunction of intrathecal infusion pump, sequela   10. End of battery life of intrathecal infusion pump   11. Pharmacologic therapy   12. Chronic use of opiate for therapeutic purpose   13. Encounter for medication  management    Problems updated and reviewed during this visit: No problems updated.  Plan of Care  Pharmacotherapy (Medications Ordered): Meds ordered this encounter  Medications   HYDROmorphone (DILAUDID) 4 MG tablet    Sig: Take 1 tablet (4 mg total) by mouth every 8 (eight) hours as needed. Each refill must last 30 days.    Dispense:  90 tablet    Refill:  0    Not Gabriel duplicate. Do NOT delete! Dispense 1 day early if closed on fill date. Warn: no CNS-depressants within 8 hrs of opioid. Do not send refill request. Renewal requires appointment. No partial fills allowed   HYDROmorphone (DILAUDID) 4 MG tablet    Sig: Take 1 tablet (4 mg total) by mouth every 8 (eight) hours as needed. Each refill must last 30 days.    Dispense:  90 tablet    Refill:  0    Not Gabriel duplicate. Do NOT delete! Dispense 1 day early if closed on fill date. Warn: no CNS-depressants within 8 hrs of opioid. Do not send refill request. Renewal requires appointment. No partial fills allowed    Procedure Orders  Lumbar Epidural Injection     Lab Orders  No laboratory test(s) ordered today   Imaging Orders  No imaging studies ordered today   Referral Orders  No referral(s) requested today    Pharmacological management options:  Opioid Analgesics: We'll take over management today. See above orders Membrane stabilizer: Options discussed, including Gabriel trial. Muscle relaxant: We have discussed the possibility of Gabriel trial NSAID: Trial discussed. Other analgesic(s): To be determined at Gabriel later time      Interventional Therapies  Risk  Complexity Considerations:   Estimated body mass index is 27.6 kg/m as calculated from the following:   Height as of this encounter: 6' 2" (1.88 m).   Weight as of this encounter: 215 lb (97.5 kg). WNL   Planned  Pending:   Therapeutic/palliative left L4-5 LESI vs caudal ESI + intrathecal pump catheter test/evaluation.   Under consideration:   Intrathecal pump  evaluation and management   Completed:   None at this time   Therapeutic  Palliative (PRN) options:   None established    Provider-requested follow-up: Return for (Clinic) procedure: (L) L4-5 LESI vs Caudal ESI + ITP catheter test, (NS). Recent Visits Date Type Provider Dept  02/07/21 Office Visit Milinda Pointer, MD [facility]  Showing recent visits within past 90 days and meeting all other requirements Today's Visits Date Type Provider Dept  04/09/21 Office Visit Milinda Pointer, MD [facility]  Showing today's visits and meeting all other requirements Future Appointments No visits were found meeting these conditions. Showing future appointments within next 90 days and meeting all other requirements Primary Care Physician: Adin Hector, MD Note by: [name], MD Date: 04/09/2021; Time: 3:13 PM

## 2021-04-09 ENCOUNTER — Ambulatory Visit: Payer: Medicare Other | Attending: Pain Medicine | Admitting: Pain Medicine

## 2021-04-09 ENCOUNTER — Other Ambulatory Visit: Payer: Self-pay

## 2021-04-09 ENCOUNTER — Encounter: Payer: Self-pay | Admitting: Pain Medicine

## 2021-04-09 VITALS — BP 130/96 | HR 78 | Temp 97.2°F | Resp 15 | Ht 74.0 in | Wt 215.0 lb

## 2021-04-09 DIAGNOSIS — M25562 Pain in left knee: Secondary | ICD-10-CM | POA: Diagnosis present

## 2021-04-09 DIAGNOSIS — Z978 Presence of other specified devices: Secondary | ICD-10-CM | POA: Insufficient documentation

## 2021-04-09 DIAGNOSIS — M961 Postlaminectomy syndrome, not elsewhere classified: Secondary | ICD-10-CM | POA: Diagnosis present

## 2021-04-09 DIAGNOSIS — M79671 Pain in right foot: Secondary | ICD-10-CM | POA: Diagnosis present

## 2021-04-09 DIAGNOSIS — M25561 Pain in right knee: Secondary | ICD-10-CM | POA: Diagnosis present

## 2021-04-09 DIAGNOSIS — M79604 Pain in right leg: Secondary | ICD-10-CM | POA: Diagnosis present

## 2021-04-09 DIAGNOSIS — M5441 Lumbago with sciatica, right side: Secondary | ICD-10-CM | POA: Diagnosis present

## 2021-04-09 DIAGNOSIS — Z462 Encounter for fitting and adjustment of other devices related to nervous system and special senses: Secondary | ICD-10-CM | POA: Diagnosis present

## 2021-04-09 DIAGNOSIS — Z79899 Other long term (current) drug therapy: Secondary | ICD-10-CM | POA: Insufficient documentation

## 2021-04-09 DIAGNOSIS — G894 Chronic pain syndrome: Secondary | ICD-10-CM | POA: Diagnosis not present

## 2021-04-09 DIAGNOSIS — M79605 Pain in left leg: Secondary | ICD-10-CM | POA: Insufficient documentation

## 2021-04-09 DIAGNOSIS — Z79891 Long term (current) use of opiate analgesic: Secondary | ICD-10-CM | POA: Insufficient documentation

## 2021-04-09 DIAGNOSIS — T85610S Breakdown (mechanical) of epidural and subdural infusion catheter, sequela: Secondary | ICD-10-CM | POA: Diagnosis present

## 2021-04-09 DIAGNOSIS — G8929 Other chronic pain: Secondary | ICD-10-CM | POA: Insufficient documentation

## 2021-04-09 DIAGNOSIS — M79672 Pain in left foot: Secondary | ICD-10-CM | POA: Insufficient documentation

## 2021-04-09 DIAGNOSIS — M5442 Lumbago with sciatica, left side: Secondary | ICD-10-CM | POA: Diagnosis present

## 2021-04-09 MED ORDER — HYDROMORPHONE HCL 4 MG PO TABS: 4.0000 mg | ORAL_TABLET | Freq: Three times a day (TID) | ORAL | 0 refills | Status: DC | PRN

## 2021-04-09 NOTE — Patient Instructions (Signed)
______________________________________________________________________  Preparing for your procedure (without sedation)  Procedure appointments are limited to planned procedures: No Prescription Refills. No disability issues will be discussed. No medication changes will be discussed.  Instructions: Oral Intake: Do not eat or drink anything for at least 6 hours prior to your procedure. (Exception: Blood Pressure Medication. See below.) Transportation: Unless otherwise stated by your physician, you may drive yourself after the procedure. Blood Pressure Medicine: Do not forget to take your blood pressure medicine with a sip of water the morning of the procedure. If your Diastolic (lower reading)is above 100 mmHg, elective cases will be cancelled/rescheduled. Blood thinners: These will need to be stopped for procedures. Notify our staff if you are taking any blood thinners. Depending on which one you take, there will be specific instructions on how and when to stop it. Diabetics on insulin: Notify the staff so that you can be scheduled 1st case in the morning. If your diabetes requires high dose insulin, take only  of your normal insulin dose the morning of the procedure and notify the staff that you have done so. Preventing infections: Shower with an antibacterial soap the morning of your procedure.  Build-up your immune system: Take 1000 mg of Vitamin C with every meal (3 times a day) the day prior to your procedure. Antibiotics: Inform the staff if you have a condition or reason that requires you to take antibiotics before dental procedures. Pregnancy: If you are pregnant, call and cancel the procedure. Sickness: If you have a cold, fever, or any active infections, call and cancel the procedure. Arrival: You must be in the facility at least 30 minutes prior to your scheduled procedure. Children: Do not bring any children with you. Dress appropriately: Bring dark clothing that you would not mind  if they get stained. Valuables: Do not bring any jewelry or valuables.  Reasons to call and reschedule or cancel your procedure: (Following these recommendations will minimize the risk of a serious complication.) Surgeries: Avoid having procedures within 2 weeks of any surgery. (Avoid for 2 weeks before or after any surgery). Flu Shots: Avoid having procedures within 2 weeks of a flu shots or . (Avoid for 2 weeks before or after immunizations). Barium: Avoid having a procedure within 7-10 days after having had a radiological study involving the use of radiological contrast. (Myelograms, Barium swallow or enema study). Heart attacks: Avoid any elective procedures or surgeries for the initial 6 months after a "Myocardial Infarction" (Heart Attack). Blood thinners: It is imperative that you stop these medications before procedures. Let us know if you if you take any blood thinner.  Infection: Avoid procedures during or within two weeks of an infection (including chest colds or gastrointestinal problems). Symptoms associated with infections include: Localized redness, fever, chills, night sweats or profuse sweating, burning sensation when voiding, cough, congestion, stuffiness, runny nose, sore throat, diarrhea, nausea, vomiting, cold or Flu symptoms, recent or current infections. It is specially important if the infection is over the area that we intend to treat. Heart and lung problems: Symptoms that may suggest an active cardiopulmonary problem include: cough, chest pain, breathing difficulties or shortness of breath, dizziness, ankle swelling, uncontrolled high or unusually low blood pressure, and/or palpitations. If you are experiencing any of these symptoms, cancel your procedure and contact your primary care physician for an evaluation.  Remember:  Regular Business hours are:  Monday to Thursday 8:00 AM to 4:00 PM  Provider's Schedule: Lamichael Youkhana, MD:  Procedure days: Tuesday and Thursday    7:30 AM to 4:00 PM  Bilal Lateef, MD:  Procedure days: Monday and Wednesday 7:30 AM to 4:00 PM ______________________________________________________________________  ____________________________________________________________________________________________  General Risks and Possible Complications  Patient Responsibilities: It is important that you read this as it is part of your informed consent. It is our duty to inform you of the risks and possible complications associated with treatments offered to you. It is your responsibility as a patient to read this and to ask questions about anything that is not clear or that you believe was not covered in this document.  Patient's Rights: You have the right to refuse treatment. You also have the right to change your mind, even after initially having agreed to have the treatment done. However, under this last option, if you wait until the last second to change your mind, you may be charged for the materials used up to that point.  Introduction: Medicine is not an exact science. Everything in Medicine, including the lack of treatment(s), carries the potential for danger, harm, or loss (which is by definition: Risk). In Medicine, a complication is a secondary problem, condition, or disease that can aggravate an already existing one. All treatments carry the risk of possible complications. The fact that a side effects or complications occurs, does not imply that the treatment was conducted incorrectly. It must be clearly understood that these can happen even when everything is done following the highest safety standards.  No treatment: You can choose not to proceed with the proposed treatment alternative. The "PRO(s)" would include: avoiding the risk of complications associated with the therapy. The "CON(s)" would include: not getting any of the treatment benefits. These benefits fall under one of three categories: diagnostic; therapeutic; and/or palliative.  Diagnostic benefits include: getting information which can ultimately lead to improvement of the disease or symptom(s). Therapeutic benefits are those associated with the successful treatment of the disease. Finally, palliative benefits are those related to the decrease of the primary symptoms, without necessarily curing the condition (example: decreasing the pain from a flare-up of a chronic condition, such as incurable terminal cancer).  General Risks and Complications: These are associated to most interventional treatments. They can occur alone, or in combination. They fall under one of the following six (6) categories: no benefit or worsening of symptoms; bleeding; infection; nerve damage; allergic reactions; and/or death. No benefits or worsening of symptoms: In Medicine there are no guarantees, only probabilities. No healthcare provider can ever guarantee that a medical treatment will work, they can only state the probability that it may. Furthermore, there is always the possibility that the condition may worsen, either directly, or indirectly, as a consequence of the treatment. Bleeding: This is more common if the patient is taking a blood thinner, either prescription or over the counter (example: Goody Powders, Fish oil, Aspirin, Garlic, etc.), or if suffering a condition associated with impaired coagulation (example: Hemophilia, cirrhosis of the liver, low platelet counts, etc.). However, even if you do not have one on these, it can still happen. If you have any of these conditions, or take one of these drugs, make sure to notify your treating physician. Infection: This is more common in patients with a compromised immune system, either due to disease (example: diabetes, cancer, human immunodeficiency virus [HIV], etc.), or due to medications or treatments (example: therapies used to treat cancer and rheumatological diseases). However, even if you do not have one on these, it can still happen. If you  have any of these conditions, or take   one of these drugs, make sure to notify your treating physician. Nerve Damage: This is more common when the treatment is an invasive one, but it can also happen with the use of medications, such as those used in the treatment of cancer. The damage can occur to small secondary nerves, or to large primary ones, such as those in the spinal cord and brain. This damage may be temporary or permanent and it may lead to impairments that can range from temporary numbness to permanent paralysis and/or brain death. Allergic Reactions: Any time a substance or material comes in contact with our body, there is the possibility of an allergic reaction. These can range from a mild skin rash (contact dermatitis) to a severe systemic reaction (anaphylactic reaction), which can result in death. Death: In general, any medical intervention can result in death, most of the time due to an unforeseen complication. ____________________________________________________________________________________________  

## 2021-04-09 NOTE — Progress Notes (Signed)
Nursing Pain Medication Assessment:  Safety precautions to be maintained throughout the outpatient stay will include: orient to surroundings, keep bed in low position, maintain call bell within reach at all times, provide assistance with transfer out of bed and ambulation.  Medication Inspection Compliance: Pill count conducted under aseptic conditions, in front of the patient. Neither the pills nor the bottle was removed from the patient's sight at any time. Once count was completed pills were immediately returned to the patient in their original bottle.  Medication: Hydromorphone (Dilaudid) Pill/Patch Count:  55 of 90 pills remain Pill/Patch Appearance: Markings consistent with prescribed medication Bottle Appearance: Standard pharmacy container. Clearly labeled. Filled Date: 17 / 12 / 2022 Last Medication intake:  Today Safety precautions to be maintained throughout the outpatient stay will include: orient to surroundings, keep bed in low position, maintain call bell within reach at all times, provide assistance with transfer out of bed and ambulation.

## 2021-04-23 NOTE — Progress Notes (Addendum)
PROVIDER NOTE: Information contained herein reflects review and annotations entered in association with encounter. Interpretation of such information and data should be left to medically-trained personnel. Information provided to patient can be located elsewhere in the medical record under "Patient Instructions". Document created using STT-dictation technology, any transcriptional errors that may result from process are unintentional.    Patient: [name]  Service Category: Procedure  Provider: [name], MD  DOB: [birth year]  DOS: 04/24/2021  Location: [facility]  MRN: [id number]  Setting: Ambulatory - outpatient  Referring Provider: Adin Hector, MD  Type: Established Patient  Specialty: Interventional Pain Management  PCP: Adin Hector, MD   Primary Reason for Visit: Interventional Pain Management Treatment. CC: Back Pain (lower)    Procedure:          Anesthesia, Analgesia, Anxiolysis:  Type: Diagnostic Epidural Steroid Injection          Region: Caudal Level: Sacrococcygeal   Laterality: Midline aiming at the left  Type: Local Anesthesia Local Anesthetic: Lidocaine 1-2% Sedation: Minimal Anxiolysis  Indication(s): Anxiety & Analgesia Route: Infiltration ([place]/IM) IV Access: Available   Position: Prone   Indications: 1. Lumbar post-laminectomy syndrome   2. 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 Lower extremity weakness (Bilateral)   4. Failed back surgical syndrome   5. Chronic lower extremity pain (2ry area of Pain) (Bilateral) (R>L)   6. 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 DDD (degenerative disc disease), lumbosacral   8. Abnormal MRI, lumbar spine (11/01/2019)   9. 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 Lumbar facet arthropathy (Multilevel) (Bilateral)   11. Lumbosacral facet syndrome (Bilateral)   12. Lumbar foraminal narrowing  (Multilevel) (Bilateral)    Pain Score: Pre-procedure: 10-Worst pain ever/10 Post-procedure: 0-No pain/10     Pre-op H&P Assessment:  [name] is a 75 y.o. (year old), male patient, seen today for interventional treatment. He  has a past surgical history that includes Coronary angioplasty; Back surgery; Nissen fundoplication; Bladder tumor excision; Mastoidectomy; Colon surgery; Cervical fusion; Intrathecal pump implantation; Infusion pump replacement; Transurethral resection of bladder tumor (N/A, 03/07/2015); and Colonoscopy with propofol (N/A, 02/27/2018). [name] has a current medication list which includes the following prescription(s): albuterol, albuterol, albuterol sulfate, amitriptyline, azelastine, bisacodyl, cholecalciferol, duloxetine, [START ON 05/02/2021] hydromorphone, [START ON 06/01/2021] hydromorphone, levothyroxine, levothyroxine, losartan, multiple vitamin, naloxone, pregabalin, promethazine, promethazine, simvastatin, tiotropium bromide-olodaterol, vitamin e acetate, and [START ON 07/01/2021] hydromorphone. His primarily concern today is the Back Pain (lower)  Initial Vital Signs:  Pulse/HCG Rate: 83ECG Heart Rate: 82 Temp: 98.2 F (36.8 C) Resp: 18 BP: 140/89 SpO2: 99 %  BMI: Estimated body mass index is 27.48 kg/m as calculated from the following:   Height as of this encounter: 6\' 2"  (1.88 m).   Weight as of this encounter: 214 lb (97.1 kg).  Risk Assessment: Allergies: Reviewed. He is allergic to codeine, levofloxacin, morphine and related, oxycontin [oxycodone hcl], and penicillins.  Allergy Precautions: None required Coagulopathies: Reviewed. None identified.  Blood-thinner therapy: None at this time Active Infection(s): Reviewed. None identified. [name] is afebrile  Site Confirmation: [name] was asked to confirm the procedure and laterality before marking the site Procedure checklist: Completed Consent: Before the procedure and under the influence of no  sedative(s), amnesic(s), or anxiolytics, the patient was informed of the treatment options, risks and possible complications. To fulfill our ethical and  legal obligations, as recommended by the American Medical Association's Code of Ethics, I have informed the patient of my clinical impression; the nature and purpose of the treatment or procedure; the risks, benefits, and possible complications of the intervention; the alternatives, including doing nothing; the risk(s) and benefit(s) of the alternative treatment(s) or procedure(s); and the risk(s) and benefit(s) of doing nothing. The patient was provided information about the general risks and possible complications associated with the procedure. These may include, but are not limited to: failure to achieve desired goals, infection, bleeding, organ or nerve damage, allergic reactions, paralysis, and death. In addition, the patient was informed of those risks and complications associated to Spine-related procedures, such as failure to decrease pain; infection (i.e.: Meningitis, epidural or intraspinal abscess); bleeding (i.e.: epidural hematoma, subarachnoid hemorrhage, or any other type of intraspinal or peri-dural bleeding); organ or nerve damage (i.e.: Any type of peripheral nerve, nerve root, or spinal cord injury) with subsequent damage to sensory, motor, and/or autonomic systems, resulting in permanent pain, numbness, and/or weakness of one or several areas of the body; allergic reactions; (i.e.: anaphylactic reaction); and/or death. Furthermore, the patient was informed of those risks and complications associated with the medications. These include, but are not limited to: allergic reactions (i.e.: anaphylactic or anaphylactoid reaction(s)); adrenal axis suppression; blood sugar elevation that in diabetics may result in ketoacidosis or comma; water retention that in patients with history of congestive heart failure may result in shortness of breath,  pulmonary edema, and decompensation with resultant heart failure; weight gain; swelling or edema; medication-induced neural toxicity; particulate matter embolism and blood vessel occlusion with resultant organ, and/or nervous system infarction; and/or aseptic necrosis of one or more joints. Finally, the patient was informed that Medicine is not an exact science; therefore, there is also the possibility of unforeseen or unpredictable risks and/or possible complications that may result in a catastrophic outcome. The patient indicated having understood very clearly. We have given the patient no guarantees and we have made no promises. Enough time was given to the patient to ask questions, all of which were answered to the patient's satisfaction. Mr. Mcshan has indicated that he wanted to continue with the procedure. Attestation: I, the ordering provider, attest that I have discussed with the patient the benefits, risks, side-effects, alternatives, likelihood of achieving goals, and potential problems during recovery for the procedure that I have provided informed consent. Date  Time: 04/24/2021  9:14 AM  Pre-Procedure Preparation:  Monitoring: As per clinic protocol. Respiration, ETCO2, SpO2, BP, heart rate and rhythm monitor placed and checked for adequate function Safety Precautions: Patient was assessed for positional comfort and pressure points before starting the procedure. Time-out: I initiated and conducted the "Time-out" before starting the procedure, as per protocol. The patient was asked to participate by confirming the accuracy of the "Time Out" information. Verification of the correct person, site, and procedure were performed and confirmed by me, the nursing staff, and the patient. "Time-out" conducted as per Joint Commission's Universal Protocol (UP.01.01.01). Time: 1000  Description of Procedure:          Target Area: Caudal Epidural Canal. Approach: Midline approach. Area Prepped: Entire  Posterior Sacrococcygeal Region DuraPrep (Iodine Povacrylex [0.7% available iodine] and Isopropyl Alcohol, 74% w/w) Safety Precautions: Aspiration looking for blood return was conducted prior to all injections. At no point did we inject any substances, as a needle was being advanced. No attempts were made at seeking any paresthesias. Safe injection practices and needle disposal techniques used. Medications  properly checked for expiration dates. SDV (single dose vial) medications used. Description of the Procedure: Protocol guidelines were followed. The patient was placed in position over the fluoroscopy table. The target area was identified and the area prepped in the usual manner. Skin & deeper tissues infiltrated with local anesthetic. Appropriate amount of time allowed to pass for local anesthetics to take effect. The procedure needles were then advanced to the target area. Proper needle placement secured. Negative aspiration confirmed. Solution injected in intermittent fashion, asking for systemic symptoms every 0.5cc of injectate. The needles were then removed and the area cleansed, making sure to leave some of the prepping solution back to take advantage of its long term bactericidal properties. Vitals:   04/24/21 1027 04/24/21 1037 04/24/21 1044 04/24/21 1049  BP: (!) 127/57 (!) 136/53 (!) 136/53 137/61  Pulse:      Resp: 16  15 15   Temp: 98 F (36.7 C)   98.1 F (36.7 C)  TempSrc:      SpO2: 96% 97% 95% 95%  Weight:      Height:        Start Time: 1000 hrs. End Time: 1017 hrs. Materials:  Needle(s) Type: Epidural needle Gauge: 17G Length: 3.5-in Medication(s): Please see orders for medications and dosing details.  Imaging Guidance (Spinal):          Type of Imaging Technique: Fluoroscopy Guidance (Spinal) Indication(s): Assistance in needle guidance and placement for procedures requiring needle placement in or near specific anatomical locations not easily accessible without such  assistance. Exposure Time: Please see nurses notes. Contrast: Before injecting any contrast, we confirmed that the patient did not have an allergy to iodine, shellfish, or radiological contrast. Once satisfactory needle placement was completed at the desired level, radiological contrast was injected. Contrast injected under live fluoroscopy. No contrast complications. See chart for type and volume of contrast used. Fluoroscopic Guidance: I was personally present during the use of fluoroscopy. "Tunnel Vision Technique" used to obtain the best possible view of the target area. Parallax error corrected before commencing the procedure. "Direction-depth-direction" technique used to introduce the needle under continuous pulsed fluoroscopy. Once target was reached, antero-posterior, oblique, and lateral fluoroscopic projection used confirm needle placement in all planes. Images permanently stored in EMR. Interpretation: I personally interpreted the imaging intraoperatively. Adequate needle placement confirmed in multiple planes. Appropriate spread of contrast into desired area was observed. No evidence of afferent or efferent intravascular uptake. No intrathecal or subarachnoid spread observed. Permanent images saved into the patient's record.  Antibiotic Prophylaxis:   Anti-infectives (From admission, onward)    None      Indication(s): None identified  Post-operative Assessment:  Post-procedure Vital Signs:  Pulse/HCG Rate: 8369 Temp: 98.1 F (36.7 C) Resp: 15 BP: 137/61 SpO2: 95 %  EBL: None  Complications: No immediate post-treatment complications observed by team, or reported by patient.  Note: The patient tolerated the entire procedure well. A repeat set of vitals were taken after the procedure and the patient was kept under observation following institutional policy, for this type of procedure. Post-procedural neurological assessment was performed, showing return to baseline, prior to  discharge. The patient was provided with post-procedure discharge instructions, including a section on how to identify potential problems. Should any problems arise concerning this procedure, the patient was given instructions to immediately contact us, at any time, without hesitation. In any case, we plan to contact the patient by telephone for a follow-up status report regarding this interventional procedure.  Comments:  No  additional relevant information.  Plan of Care  Orders:  Orders Placed This Encounter  Procedures   Caudal Epidural Injection    Scheduling Instructions:     Laterality: Left-sided     Level(s): Sacrococcygeal canal (Tailbone area)     Sedation: Patient's choice     Timeframe: Today    Order Specific Question:   Where will this procedure be performed?    Answer:   [facility]   DG [facility] C-ARM 1-60 MIN NO REPORT    Intraoperative interpretation by procedural physician at [facility].    Standing Status:   Standing    Number of Occurrences:   1    Order Specific Question:   Reason for exam:    Answer:   Assistance in needle guidance and placement for procedures requiring needle placement in or near specific anatomical locations not easily accessible without such assistance.   Provide equipment / supplies at bedside    "Epidural Tray" (Disposable  single use) Catheter: NOT required    Standing Status:   Standing    Number of Occurrences:   1    Order Specific Question:   Specify    Answer:   Epidural Tray   Informed Consent Details: Physician/Practitioner Attestation; Transcribe to consent form and obtain patient signature    Nursing Order: Transcribe to consent form and obtain patient signature. Note: Always confirm laterality of pain with Mr. Sevigny, before procedure.    Order Specific Question:   Physician/Practitioner attestation of informed consent for procedure/surgical case    Answer:   I, the physician/practitioner, attest that I  have discussed with the patient the benefits, risks, side effects, alternatives, likelihood of achieving goals and potential problems during recovery for the procedure that I have provided informed consent.    Order Specific Question:   Procedure    Answer:   Caudal epidural steroid injection    Order Specific Question:   Physician/Practitioner performing the procedure    Answer:   Gregori Abril A. Dossie Arbour, MD    Order Specific Question:   Indication/Reason    Answer:   Low back pain and lower extremity pain secondary to lumbosacral radiculitis   Chronic Opioid Analgesic:  Hydromorphone 4 mg tablet, 1 tab p.o. 3 times daily (last filled on 04/02/2021) MME/day: 0 mg/day    Medications ordered for procedure: Meds ordered this encounter  Medications   iohexol (OMNIPAQUE) 180 MG/ML injection 10 mL    Must be Myelogram-compatible. If not available, you may substitute with a water-soluble, non-ionic, hypoallergenic, myelogram-compatible radiological contrast medium.   lidocaine (XYLOCAINE) 2 % (with pres) injection 400 mg   pentafluoroprop-tetrafluoroeth (GEBAUERS) aerosol   lactated ringers infusion 1,000 mL   midazolam (VERSED) 5 MG/5ML injection 0.5-2 mg    Make sure Flumazenil is available in the pyxis when using this medication. If oversedation occurs, administer 0.2 mg IV over 15 sec. If after 45 sec no response, administer 0.2 mg again over 1 min; may repeat at 1 min intervals; not to exceed 4 doses (1 mg)   triamcinolone acetonide (KENALOG-40) injection 40 mg   sodium chloride flush (NS) 0.9 % injection 2 mL   ropivacaine (PF) 2 mg/mL (0.2%) (NAROPIN) injection 2 mL   HYDROmorphone (DILAUDID) 4 MG tablet    Sig: Take 1 tablet (4 mg total) by mouth every 8 (eight) hours as needed. Each refill must last 30 days.    Dispense:  90 tablet    Refill:  0  Not a duplicate. Do NOT delete! Dispense 1 day early if closed on fill date. Warn: no CNS-depressants within 8 hrs of opioid. Do not send  refill request. Renewal requires appointment. No partial fills allowed   Medications administered: We administered iohexol, lidocaine, pentafluoroprop-tetrafluoroeth, lactated ringers, midazolam, triamcinolone acetonide, sodium chloride flush, and ropivacaine (PF) 2 mg/mL (0.2%).  See the medical record for exact dosing, route, and time of administration.  Follow-up plan:   Return in about 2 weeks (around 05/08/2021) for Proc-day (T,Th), (VV), (PPE).       Interventional Therapies  Risk  Complexity Considerations:   Estimated body mass index is 27.6 kg/m as calculated from the following:   Height as of this encounter: 6\' 2"  (1.88 m).   Weight as of this encounter: 215 lb (97.5 kg). WNL   Planned  Pending:   Therapeutic/palliative left L4-5 LESI vs caudal ESI + intrathecal pump catheter test/evaluation.   Under consideration:   Intrathecal pump evaluation and management   Completed:   None at this time   Therapeutic  Palliative (PRN) options:   None established     Recent Visits Date Type Provider Dept  04/09/21 Office Visit Milinda Pointer, MD [facility]  02/07/21 Office Visit Milinda Pointer, MD [facility]  Showing recent visits within past 90 days and meeting all other requirements Today's Visits Date Type Provider Dept  04/24/21 Procedure visit Milinda Pointer, MD [facility]  Showing today's visits and meeting all other requirements Future Appointments Date Type Provider Dept  05/08/21 Appointment Milinda Pointer, MD [facility]  Showing future appointments within next 90 days and meeting all other requirements Disposition: Discharge home  Discharge (Date  Time): 04/24/2021; 1051 hrs.   Primary Care Physician: Adin Hector, MD Location: [facility] Note by: [name], MD Date: 04/24/2021; Time: 1:33 PM  Disclaimer:  Medicine is not an Chief Strategy Officer. The only  guarantee in medicine is that nothing is guaranteed. It is important to note that the decision to proceed with this intervention was based on the information collected from the patient. The Data and conclusions were drawn from the patient's questionnaire, the interview, and the physical examination. Because the information was provided in large part by the patient, it cannot be guaranteed that it has not been purposely or unconsciously manipulated. Every effort has been made to obtain as much relevant data as possible for this evaluation. It is important to note that the conclusions that lead to this procedure are derived in large part from the available data. Always take into account that the treatment will also be dependent on availability of resources and existing treatment guidelines, considered by other Pain Management Practitioners as being common knowledge and practice, at the time of the intervention. For Medico-Legal purposes, it is also important to point out that variation in procedural techniques and pharmacological choices are the acceptable norm. The indications, contraindications, technique, and results of the above procedure should only be interpreted and judged by a Board-Certified Interventional Pain Specialist with extensive familiarity and expertise in the same exact procedure and technique.

## 2021-04-24 ENCOUNTER — Ambulatory Visit
Admission: RE | Admit: 2021-04-24 | Discharge: 2021-04-24 | Disposition: A | Discharge status: Home / Self Care | Payer: Medicare Other | Source: Ambulatory Visit | Attending: Pain Medicine | Admitting: Pain Medicine

## 2021-04-24 ENCOUNTER — Ambulatory Visit (HOSPITAL_BASED_OUTPATIENT_CLINIC_OR_DEPARTMENT_OTHER): Payer: Medicare Other | Admitting: Pain Medicine

## 2021-04-24 ENCOUNTER — Other Ambulatory Visit: Payer: Self-pay

## 2021-04-24 VITALS — BP 137/61 | HR 83 | Temp 98.1°F | Resp 15 | Ht 74.0 in | Wt 214.0 lb

## 2021-04-24 DIAGNOSIS — M961 Postlaminectomy syndrome, not elsewhere classified: Secondary | ICD-10-CM | POA: Diagnosis not present

## 2021-04-24 DIAGNOSIS — M48062 Spinal stenosis, lumbar region with neurogenic claudication: Secondary | ICD-10-CM | POA: Insufficient documentation

## 2021-04-24 DIAGNOSIS — R937 Abnormal findings on diagnostic imaging of other parts of musculoskeletal system: Secondary | ICD-10-CM | POA: Diagnosis present

## 2021-04-24 DIAGNOSIS — M5137 Other intervertebral disc degeneration, lumbosacral region: Secondary | ICD-10-CM

## 2021-04-24 DIAGNOSIS — G8929 Other chronic pain: Secondary | ICD-10-CM | POA: Diagnosis present

## 2021-04-24 DIAGNOSIS — M47817 Spondylosis without myelopathy or radiculopathy, lumbosacral region: Secondary | ICD-10-CM | POA: Diagnosis present

## 2021-04-24 DIAGNOSIS — M5442 Lumbago with sciatica, left side: Secondary | ICD-10-CM | POA: Diagnosis present

## 2021-04-24 DIAGNOSIS — M79672 Pain in left foot: Secondary | ICD-10-CM | POA: Insufficient documentation

## 2021-04-24 DIAGNOSIS — M5417 Radiculopathy, lumbosacral region: Secondary | ICD-10-CM | POA: Diagnosis present

## 2021-04-24 DIAGNOSIS — Z79891 Long term (current) use of opiate analgesic: Secondary | ICD-10-CM

## 2021-04-24 DIAGNOSIS — Z79899 Other long term (current) drug therapy: Secondary | ICD-10-CM | POA: Insufficient documentation

## 2021-04-24 DIAGNOSIS — M47816 Spondylosis without myelopathy or radiculopathy, lumbar region: Secondary | ICD-10-CM | POA: Diagnosis present

## 2021-04-24 DIAGNOSIS — M25562 Pain in left knee: Secondary | ICD-10-CM | POA: Insufficient documentation

## 2021-04-24 DIAGNOSIS — M79671 Pain in right foot: Secondary | ICD-10-CM | POA: Insufficient documentation

## 2021-04-24 DIAGNOSIS — G894 Chronic pain syndrome: Secondary | ICD-10-CM | POA: Diagnosis present

## 2021-04-24 DIAGNOSIS — M5441 Lumbago with sciatica, right side: Secondary | ICD-10-CM | POA: Diagnosis present

## 2021-04-24 DIAGNOSIS — M79604 Pain in right leg: Secondary | ICD-10-CM | POA: Insufficient documentation

## 2021-04-24 DIAGNOSIS — R29898 Other symptoms and signs involving the musculoskeletal system: Secondary | ICD-10-CM

## 2021-04-24 DIAGNOSIS — M79605 Pain in left leg: Secondary | ICD-10-CM | POA: Insufficient documentation

## 2021-04-24 DIAGNOSIS — M25561 Pain in right knee: Secondary | ICD-10-CM | POA: Insufficient documentation

## 2021-04-24 DIAGNOSIS — M48061 Spinal stenosis, lumbar region without neurogenic claudication: Secondary | ICD-10-CM

## 2021-04-24 MED ORDER — TRIAMCINOLONE ACETONIDE 40 MG/ML IJ SUSP
40.0000 mg | Freq: Once | INTRAMUSCULAR | Status: AC
  Administered 2021-04-24: 40 mg
  Filled 2021-04-24: qty 1

## 2021-04-24 MED ORDER — SODIUM CHLORIDE 0.9% FLUSH
2.0000 mL | Freq: Once | INTRAVENOUS | Status: AC
  Administered 2021-04-24: 2 mL

## 2021-04-24 MED ORDER — LIDOCAINE HCL 2 % IJ SOLN
20.0000 mL | Freq: Once | INTRAMUSCULAR | Status: AC
  Administered 2021-04-24: 400 mg
  Filled 2021-04-24: qty 20

## 2021-04-24 MED ORDER — ROPIVACAINE HCL 2 MG/ML IJ SOLN
2.0000 mL | Freq: Once | INTRAMUSCULAR | Status: AC
  Administered 2021-04-24: 2 mL via EPIDURAL

## 2021-04-24 MED ORDER — LACTATED RINGERS IV SOLN
1000.0000 mL | Freq: Once | INTRAVENOUS | Status: AC
  Administered 2021-04-24: 1000 mL via INTRAVENOUS

## 2021-04-24 MED ORDER — HYDROMORPHONE HCL 4 MG PO TABS: 4.0000 mg | ORAL_TABLET | Freq: Three times a day (TID) | ORAL | 0 refills | Status: DC | PRN

## 2021-04-24 MED ORDER — IOHEXOL 180 MG/ML  SOLN
10.0000 mL | Freq: Once | INTRAMUSCULAR | Status: AC
  Administered 2021-04-24: 5 mL via EPIDURAL

## 2021-04-24 MED ORDER — ROPIVACAINE HCL 2 MG/ML IJ SOLN
INTRAMUSCULAR | Status: AC
  Filled 2021-04-24: qty 20

## 2021-04-24 MED ORDER — PENTAFLUOROPROP-TETRAFLUOROETH EX AERO
INHALATION_SPRAY | Freq: Once | CUTANEOUS | Status: AC
  Administered 2021-04-24: 1 via TOPICAL
  Filled 2021-04-24: qty 116

## 2021-04-24 MED ORDER — MIDAZOLAM HCL 5 MG/5ML IJ SOLN
0.5000 mg | Freq: Once | INTRAMUSCULAR | Status: AC
  Administered 2021-04-24: 2 mg via INTRAVENOUS
  Filled 2021-04-24: qty 5

## 2021-04-24 MED ORDER — SODIUM CHLORIDE (PF) 0.9 % IJ SOLN
INTRAMUSCULAR | Status: AC
  Filled 2021-04-24: qty 10

## 2021-04-24 NOTE — Patient Instructions (Signed)

## 2021-05-08 ENCOUNTER — Telehealth: Payer: Medicare Other | Admitting: Pain Medicine

## 2021-05-15 ENCOUNTER — Telehealth: Payer: Medicare Other | Admitting: Pain Medicine

## 2021-05-28 ENCOUNTER — Other Ambulatory Visit: Payer: Self-pay | Admitting: Pain Medicine

## 2021-06-06 ENCOUNTER — Encounter: Payer: Self-pay | Admitting: Pain Medicine

## 2021-06-06 NOTE — Progress Notes (Signed)
Patient: [name]  Service Category: E/M  Provider: [name], MD  DOB: [birth year]  DOS: 06/07/2021  Location: Office  MRN: [id number]  Setting: Ambulatory outpatient  Referring Provider: Adin Hector, MD  Type: Established Patient  Specialty: Interventional Pain Management  PCP: Gabriel Hector, MD  Location: Remote location  Delivery: TeleHealth     Virtual Encounter - Pain Management PROVIDER NOTE: Information contained herein reflects review and annotations entered in association with encounter. Interpretation of such information and data should be left to medically-trained personnel. Information provided to patient can be located elsewhere in the medical record under "Patient Instructions". Document created using STT-dictation technology, any transcriptional errors that may result from process are unintentional.    Contact & Pharmacy Preferred: [phone] Home: [phone] (home) Mobile: [phone] (mobile) E-mail: [email or web address]  [id number] [name], [address] [place] [address] Phone: [phone] Fax: [phone]   Pre-screening  [name] offered "in-person" vs "virtual" encounter. He indicated preferring virtual for this encounter.   Reason COVID-19*  Social distancing based on CDC and AMA recommendations.   I contacted [name] on 06/07/2021 via telephone.      I clearly identified myself as Gabriel Cola, MD. I verified that I was speaking with the correct person using two identifiers (Name: [name], and date of birth: 1945-12-03).  Consent I sought verbal advanced consent from [name] for virtual visit interactions. I informed [name] of possible security and privacy concerns, risks, and limitations associated with providing "not-in-person" medical evaluation and management services. I also informed [name] of the availability  of "in-person" appointments. Finally, I informed him that there would be a charge for the virtual visit and that he could be  personally, fully or partially, financially responsible for it. [name] expressed understanding and agreed to proceed.   Historic Elements   [name] is a 75 y.o. year old, male patient evaluated today after our last contact on 04/24/2021. [name]  has a past medical history of Anxiety, Arthritis, Asthma, Cancer ([place]), CHF (congestive heart failure) ([place]), COPD (chronic obstructive pulmonary disease) ([place]), Coronary artery disease, Depression, Diabetes mellitus without complication ([place]), DVT of lower extremity, bilateral ([place]), Dysrhythmia, Hypertension, Neuropathy, Pain, Peripheral vascular disease ([place]), S/P IVC filter, Sleep apnea, Sleep disorder, and Stasis dermatitis of both legs. He also  has a past surgical history that includes Coronary angioplasty; Back surgery; Nissen fundoplication; Bladder tumor excision; Mastoidectomy; Colon surgery; Cervical fusion; Intrathecal pump implantation; Infusion pump replacement; Transurethral resection of bladder tumor (N/A, 03/07/2015); and Colonoscopy with propofol (N/A, 02/27/2018). [name] has a current medication list which includes the following prescription(s): albuterol, albuterol, albuterol sulfate, amitriptyline, azelastine, bisacodyl, cholecalciferol, duloxetine, hydromorphone, [START ON 07/01/2021] hydromorphone, levothyroxine, levothyroxine, losartan, multiple vitamin, naloxone, pregabalin, promethazine, promethazine, simvastatin, tiotropium bromide-olodaterol, vitamin e acetate, and hydromorphone. He  reports that he has been smoking cigarettes. He has been smoking an average of 1 pack per day. He has never used smokeless tobacco. He reports that he does not drink alcohol and does not use drugs. [name] is allergic to codeine and penicillins.   HPI  Today, he is being contacted for a post-procedure assessment.  According  to the patient, he did not get any benefit in terms as his low back pain with the caudal epidural steroid injection.  He did admit to  me that while the numbing medicine was in place his low back pain went from a 9/10 to a 4/10, which represents a more than 50% improvement in his pain, which lasted for approximately 2 days.  After that, the pain in his lower back returned and he describes this low back pain as being even worse than before.  However, when I asked him about the leg pain, he indicated that his leg pain completely went away (100% relief) after the caudal epidural steroid injection.  He indicates that this leg pain has began to slowly come back and he describes currently enjoying a 50 to 60% improvement of the lower extremity pain.  At this point, he refers that his worst pain is in the lower back.  Today I asked the patient about the pacemaker and he indicated that he does not know anything regarding a pacemaker.  He refers currently being evaluated by [name], who apparently gave him a Holter monitor but the patient refers that he has not talked to him about the results or what the plan is.  I also asked the patient whether or not he had seen [name] and he indicated that he had talked to him and he was a little concerned that [name] had told him that he was not planning on replacing the intrathecal catheter.  I told [name] that there was no need to replace the catheter, unless there was a problem with that.  I went ahead and explained to [name] how we could check on whether the catheter was working or not by accessing the side-port of the pump and aspirating.  If we obtained CSF back, then there was probably no need to change the catheter and the only thing that needed to be done was replaced the pump.  If on the other hand the catheter was nonfunctional, the choice would be to implant a new catheter and a new pump or simply remove the old pump and catheter and not replace it.   Personally, knowing this patient, I believe that it would probably be better to have a pump and a catheter that are working correctly so that we can control his pain appropriately.  Today the patient asked me about his medications and I reminded him that he has enough prescriptions in the pharmacy to last until January.  Post-Procedure Evaluation  Procedure (04/24/2021):  Procedure:           Anesthesia, Analgesia, Anxiolysis:  Type: Diagnostic Epidural Steroid Injection          Region: Caudal Level: Sacrococcygeal   Laterality: Midline aiming at the left   Type: Local Anesthesia Local Anesthetic: Lidocaine 1-2% Sedation: Minimal Anxiolysis  Indication(s): Anxiety & Analgesia Route: Infiltration ([place]/IM) IV Access: Available     Position: Prone    Indications: 1. Lumbar post-laminectomy syndrome   2. 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 Lower extremity weakness (Bilateral)   4. Failed back surgical syndrome   5. Chronic lower extremity pain (2ry area of Pain) (Bilateral) (R>L)   6. 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 DDD (degenerative disc disease), lumbosacral   8. Abnormal MRI, lumbar spine (11/01/2019)   9. 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 Lumbar facet arthropathy (Multilevel) (Bilateral)   11. Lumbosacral facet syndrome (Bilateral)   12. Lumbar foraminal narrowing (Multilevel) (Bilateral)     Pain Score: Pre-procedure: 10-Worst pain ever/10  Post-procedure: 0-No pain/10     Anxiolysis: Please see nurses note.  Effectiveness during initial hour after procedure (Ultra-Short Term Relief): 50% relief of LBP & 100% of LEP.  Local anesthetic used: Long-acting (4-6 hours) Effectiveness: Defined as any analgesic benefit obtained secondary to the administration of local anesthetics. This carries significant diagnostic value as to the etiological location, or anatomical  origin, of the pain. Duration of benefit is expected to coincide with the duration of the local anesthetic used.  Effectiveness during initial 4-6 hours after procedure (Short-Term Relief): 50% relief of LBP & 100% of LEP.  Long-term benefit: Defined as any relief past the pharmacologic duration of the local anesthetics.  Effectiveness past the initial 6 hours after procedure (Long-Term Relief): 0% relief of LBP & 100% of LEP x2 days.  Benefits, current: Defined as benefit present at the time of this evaluation.   Analgesia: Currently the patient is still enjoying a 50 to 60% improvement of the lower extremity pain however, he is getting no benefit in terms of the low back pain. Function: No improvement ROM: No improvement  Intrathecal Drug Delivery System (IDDS)  Pump Device:  Manufacturer: Medtronic Model: Synchromed II Model No.: [id number] Serial No.: [id number] H Delivery Route: Intrathecal Type: Programmable  Volume (mL): 40 mL reservoir Priming Volume: n/a  Calibration Constant: 111.0  MRI compatibility: Conditional   Implant Details:  Date: 09/28/2014 Implanter: Marinda Elk, MD  Contact Information: Unknown Last Revision/Replacement: 09/28/2014 Estimated Replacement Date: Nov 2022  Implant Site: Abdominal Laterality: Right  Catheter: Manufacturer: Medtronic Model: [place]enda Model No.: [id number]  Serial No.: n/a  Implanted Length (cm): 86.0  Catheter Volume (mL): 0.189  Tip Location (Level): T7 Canal Access Site: L2-3  Drug content:  Primary Medication Class:  Other   Medication:  PFNSS   Concentration: 1.0 mg/mL   Secondary Medication Class: none  Medication:  n/a   Concentration: n/a   Tertiary Medication Class: none   PA parameters (PCA-mode):  Mode: Off (Inactive) Bolus Dose: n/a  Programming:  Type: Simple continuous.  Medication, Concentration, Infusion Program, & Delivery Rate: For up-to-date details please see most recent scanned programming  printout.   Pharmacotherapy Assessment   Analgesic: Hydromorphone 4 mg tablet, 1 tab p.o. 3 times daily (last filled on 04/02/2021) MME/day: 0 mg/day    Monitoring: [place] PMP: PDMP reviewed during this encounter.       Pharmacotherapy: No side-effects or adverse reactions reported. Compliance: No problems identified. Effectiveness: Clinically acceptable. Plan: Refer to "POC". UDS:  Summary  Date Value Ref Range Status  02/07/2021 Note  Final    Comment:    ==================================================================== Compliance Drug Analysis, Ur ==================================================================== Test                             Result       Flag       Units  Drug Present and Declared for Prescription Verification   Pregabalin                     PRESENT      EXPECTED   Amitriptyline                  PRESENT      EXPECTED   Nortriptyline                  PRESENT      EXPECTED    Nortriptyline is an expected metabolite of  amitriptyline.  Drug Absent but Declared for Prescription Verification   Duloxetine                     Not Detected UNEXPECTED   Promethazine                   Not Detected UNEXPECTED   Bupivacaine                    Not Detected UNEXPECTED   Clonidine                      Not Detected UNEXPECTED ==================================================================== Test                      Result    Flag   Units      Ref Range   Creatinine              108              mg/dL      >=20 ==================================================================== Declared Medications:  The flagging and interpretation on this report are based on the  following declared medications.  Unexpected results may arise from  inaccuracies in the declared medications.   **Note: The testing scope of this panel includes these medications:   Amitriptyline (Elavil)  Bupivacaine  Clonidine (Catapres)  Duloxetine (Cymbalta)  Pregabalin (Lyrica)  Promethazine  (Phenergan)   **Note: The testing scope of this panel does not include the  following reported medications:   Albuterol (Ventolin HFA)  Bisacodyl  Levothyroxine (Synthroid)  Losartan (Cozaar)  Multivitamin  Naloxone (Narcan)  Simvastatin (Zocor) ==================================================================== For clinical consultation, please call [phone]. ====================================================================      Laboratory Chemistry Profile   Renal Lab Results  Component Value Date   BUN 10 02/07/2021   CREATININE 0.94 02/07/2021   BCR 11 02/07/2021   GFRAA >60 03/02/2015   GFRNONAA >60 10/04/2020    Hepatic Lab Results  Component Value Date   AST 18 02/07/2021   ALBUMIN 4.2 02/07/2021   ALKPHOS 123 (H) 02/07/2021    Electrolytes Lab Results  Component Value Date   NA 142 02/07/2021   K 4.8 02/07/2021   CL 103 02/07/2021   CALCIUM 9.2 02/07/2021   MG 2.2 02/07/2021    Bone Lab Results  Component Value Date   25OHVITD1 22 (L) 02/07/2021   25OHVITD2 <1.0 02/07/2021   25OHVITD3 22 02/07/2021    Inflammation (CRP: Acute Phase) (ESR: Chronic Phase) Lab Results  Component Value Date   CRP 3 02/07/2021   ESRSEDRATE 6 02/07/2021         Note: Above Lab results reviewed.  Imaging  DG PAIN CLINIC C-ARM 1-60 MIN NO REPORT Fluoro was used, but no Radiologist interpretation will be provided.  Please refer to "NOTES" tab for provider progress note.  Assessment  The primary encounter diagnosis was Chronic low back pain (1ry area of Pain) (Bilateral) (R>L) w/ sciatica (Bilateral). Diagnoses of Chronic lower extremity pain (2ry area of Pain) (Bilateral) (R>L), Chronic feet pain (3ry area of Pain) (Bilateral) (R>L), Chronic knee pain (4th area of Pain) (Bilateral) (R>L), Failed back surgical syndrome, Chronic pain syndrome, End of battery life of intrathecal infusion pump, Lumbar post-laminectomy syndrome, and Malfunction of intrathecal  infusion pump, sequela were also pertinent to this visit.  Plan of Care  Problem-specific:  No problem-specific Assessment & Plan notes found for this encounter.  [name] has  a current medication list which includes the following long-term medication(s): albuterol, albuterol, albuterol sulfate, amitriptyline, azelastine, duloxetine, hydromorphone, [START ON 07/01/2021] hydromorphone, levothyroxine, levothyroxine, losartan, promethazine, promethazine, simvastatin, and hydromorphone.  Pharmacotherapy (Medications Ordered): No orders of the defined types were placed in this encounter.  Orders:  Orders Placed This Encounter  Procedures   Misc procedure    Standing Status:   Future    Standing Expiration Date:   09/07/2021    Scheduling Instructions:     Type of procedure: Intrathecal catheter test     Fluoroscopy needed     Sedation: No Sedation.     Timeframe: ASAP    Follow-up plan:   Return for (Clinic) procedure: IT catheter test (NS) Fluoro needed.     Interventional Therapies  Risk  Complexity Considerations:   Estimated body mass index is 27.6 kg/m as calculated from the following:   Height as of this encounter: _0  (1.88 m).   Weight as of this encounter: 215 lb (97.5 kg). WNL   Planned  Pending:   Pending surgical clearance by cardiology ([name]). Diagnostic intrathecal pump catheter test/evaluation (side-port aspiration and injection of contrast under fluoroscopy)  Intrathecal pump replacement by [name]    Under consideration:   Diagnostic intrathecal pump catheter test/evaluation  Diagnostic/therapeutic bilateral lumbar facet MBB #1  Therapeutic/palliative left L4-5 LESI #1  Intrathecal pump evaluation and management Intrathecal pump replacement secondary to end of battery life. Referral entered on 02/07/2021 for [facility]surgery Deetta Perla, MD) for intrathecal pump replacement.   Completed:   Diagnostic left caudal ESI and  epidurogram x1 (04/24/2021) (LBP: 50/50/0  LEP; 100/100/50-60)    Therapeutic  Palliative (PRN) options:   None established      Recent Visits Date Type Provider Dept  04/24/21 Procedure visit Milinda Pointer, MD [facility]  04/09/21 Office Visit Milinda Pointer, MD [facility]  Showing recent visits within past 90 days and meeting all other requirements Today's Visits Date Type Provider Dept  06/07/21 Office Visit Milinda Pointer, MD [facility]  Showing today's visits and meeting all other requirements Future Appointments Date Type Provider Dept  07/25/21 Appointment Milinda Pointer, MD [facility]  Showing future appointments within next 90 days and meeting all other requirements I discussed the assessment and treatment plan with the patient. The patient was provided an opportunity to ask questions and all were answered. The patient agreed with the plan and demonstrated an understanding of the instructions.  Patient advised to call back or seek an in-person evaluation if the symptoms or condition worsens.  Duration of encounter: 18 minutes.  Note by: Gabriel Cola, MD Date: 06/07/2021; Time: 6:37 PM

## 2021-06-07 ENCOUNTER — Ambulatory Visit: Payer: Medicare Other | Attending: Pain Medicine | Admitting: Pain Medicine

## 2021-06-07 ENCOUNTER — Other Ambulatory Visit: Payer: Self-pay

## 2021-06-07 DIAGNOSIS — G894 Chronic pain syndrome: Secondary | ICD-10-CM

## 2021-06-07 DIAGNOSIS — Z462 Encounter for fitting and adjustment of other devices related to nervous system and special senses: Secondary | ICD-10-CM

## 2021-06-07 DIAGNOSIS — G8929 Other chronic pain: Secondary | ICD-10-CM

## 2021-06-07 DIAGNOSIS — M25562 Pain in left knee: Secondary | ICD-10-CM

## 2021-06-07 DIAGNOSIS — M961 Postlaminectomy syndrome, not elsewhere classified: Secondary | ICD-10-CM

## 2021-06-07 DIAGNOSIS — M5442 Lumbago with sciatica, left side: Secondary | ICD-10-CM

## 2021-06-07 DIAGNOSIS — M25561 Pain in right knee: Secondary | ICD-10-CM | POA: Diagnosis not present

## 2021-06-07 DIAGNOSIS — M79605 Pain in left leg: Secondary | ICD-10-CM

## 2021-06-07 DIAGNOSIS — M79604 Pain in right leg: Secondary | ICD-10-CM | POA: Diagnosis not present

## 2021-06-07 DIAGNOSIS — M79671 Pain in right foot: Secondary | ICD-10-CM

## 2021-06-07 DIAGNOSIS — T85610S Breakdown (mechanical) of epidural and subdural infusion catheter, sequela: Secondary | ICD-10-CM

## 2021-06-07 DIAGNOSIS — M79672 Pain in left foot: Secondary | ICD-10-CM

## 2021-06-07 DIAGNOSIS — M5441 Lumbago with sciatica, right side: Secondary | ICD-10-CM

## 2021-06-21 ENCOUNTER — Ambulatory Visit (HOSPITAL_BASED_OUTPATIENT_CLINIC_OR_DEPARTMENT_OTHER): Payer: Medicare Other | Admitting: Pain Medicine

## 2021-06-21 ENCOUNTER — Ambulatory Visit
Admission: RE | Admit: 2021-06-21 | Discharge: 2021-06-21 | Disposition: A | Discharge status: Home / Self Care | Payer: Medicare Other | Source: Ambulatory Visit | Attending: Pain Medicine | Admitting: Pain Medicine

## 2021-06-21 ENCOUNTER — Encounter: Payer: Self-pay | Admitting: Pain Medicine

## 2021-06-21 ENCOUNTER — Other Ambulatory Visit: Payer: Self-pay

## 2021-06-21 VITALS — BP 128/79 | HR 79 | Temp 97.0°F | Resp 17 | Ht 74.0 in | Wt 212.0 lb

## 2021-06-21 DIAGNOSIS — Y838 Other surgical procedures as the cause of abnormal reaction of the patient, or of later complication, without mention of misadventure at the time of the procedure: Secondary | ICD-10-CM | POA: Diagnosis not present

## 2021-06-21 DIAGNOSIS — M961 Postlaminectomy syndrome, not elsewhere classified: Secondary | ICD-10-CM

## 2021-06-21 DIAGNOSIS — Z955 Presence of coronary angioplasty implant and graft: Secondary | ICD-10-CM | POA: Insufficient documentation

## 2021-06-21 DIAGNOSIS — M79671 Pain in right foot: Secondary | ICD-10-CM

## 2021-06-21 DIAGNOSIS — Z462 Encounter for fitting and adjustment of other devices related to nervous system and special senses: Secondary | ICD-10-CM | POA: Insufficient documentation

## 2021-06-21 DIAGNOSIS — M5137 Other intervertebral disc degeneration, lumbosacral region: Secondary | ICD-10-CM | POA: Insufficient documentation

## 2021-06-21 DIAGNOSIS — M79604 Pain in right leg: Secondary | ICD-10-CM | POA: Insufficient documentation

## 2021-06-21 DIAGNOSIS — Z79899 Other long term (current) drug therapy: Secondary | ICD-10-CM

## 2021-06-21 DIAGNOSIS — M5442 Lumbago with sciatica, left side: Secondary | ICD-10-CM

## 2021-06-21 DIAGNOSIS — T85610D Breakdown (mechanical) of epidural and subdural infusion catheter, subsequent encounter: Secondary | ICD-10-CM | POA: Diagnosis not present

## 2021-06-21 DIAGNOSIS — G8929 Other chronic pain: Secondary | ICD-10-CM | POA: Diagnosis not present

## 2021-06-21 DIAGNOSIS — G894 Chronic pain syndrome: Secondary | ICD-10-CM

## 2021-06-21 DIAGNOSIS — Z79890 Hormone replacement therapy (postmenopausal): Secondary | ICD-10-CM | POA: Insufficient documentation

## 2021-06-21 DIAGNOSIS — Z79891 Long term (current) use of opiate analgesic: Secondary | ICD-10-CM

## 2021-06-21 DIAGNOSIS — Z978 Presence of other specified devices: Secondary | ICD-10-CM

## 2021-06-21 DIAGNOSIS — M79605 Pain in left leg: Secondary | ICD-10-CM | POA: Insufficient documentation

## 2021-06-21 DIAGNOSIS — M5441 Lumbago with sciatica, right side: Secondary | ICD-10-CM | POA: Diagnosis present

## 2021-06-21 DIAGNOSIS — Z7951 Long term (current) use of inhaled steroids: Secondary | ICD-10-CM | POA: Diagnosis not present

## 2021-06-21 DIAGNOSIS — M25561 Pain in right knee: Secondary | ICD-10-CM

## 2021-06-21 MED ORDER — HYDROMORPHONE HCL 4 MG PO TABS: 4.0000 mg | ORAL_TABLET | Freq: Three times a day (TID) | ORAL | 0 refills | Status: DC | PRN

## 2021-06-21 MED ORDER — LIDOCAINE HCL 2 % IJ SOLN
INTRAMUSCULAR | Status: AC
  Filled 2021-06-21: qty 20

## 2021-06-21 MED ORDER — PAIN MANAGEMENT IT PUMP REFILL: 1.0000 | Freq: Once | INTRATHECAL | 0 refills | Status: AC

## 2021-06-21 MED ORDER — IOHEXOL 180 MG/ML  SOLN
10.0000 mL | Freq: Once | INTRAMUSCULAR | Status: AC
  Administered 2021-06-21: 5 mL via INTRATHECAL

## 2021-06-21 NOTE — Progress Notes (Signed)
PROVIDER NOTE: Information contained herein reflects review and annotations entered in association with encounter. Interpretation of such information and data should be left to medically-trained personnel. Information provided to patient can be located elsewhere in the medical record under "Patient Instructions". Document created using STT-dictation technology, any transcriptional errors that may result from process are unintentional.    Patient: [name]  Service Category: Procedure Provider: [name], MD DOB: [birth year] DOS: 06/21/2021 Location: [facility] MRN: [id number] Setting: Ambulatory - outpatient Referring Provider: Adin Hector, MD Type: Established Patient Specialty: Interventional Pain Management PCP: Gabriel Hector, MD  Primary Reason for Visit: Interventional Pain Management Treatment. CC: Back Pain  Procedure:          Type: Management of Intrathecal Drug Delivery System (IDDS) - Intrathecal pump and catheter dye study. No rate change.  Indications: 1. Malfunction of intrathecal infusion pump, subsequent encounter   2. Presence of intrathecal pump   3. 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 DDD (degenerative disc disease), lumbosacral   6. End of battery life of intrathecal infusion pump   7. Failed back surgical syndrome    Pain Assessment: Self-Reported Pain Score: 9 /10             Reported level is compatible with observation.         Intrathecal Drug Delivery System (IDDS)  Pump Device:  Manufacturer: Medtronic Model: Synchromed II Model No.: [id number] Serial No.: [id number] H Delivery Route: Intrathecal Type: Programmable  Volume (mL): 40 mL reservoir Priming Volume: n/a  Calibration Constant: 111.0  MRI compatibility: Conditional   Implant Details:  Date: 09/28/2014 Implanter: Gabriel Elk,  MD  Contact Information: [phone] Last Revision/Replacement: 09/28/2014 Estimated Replacement Date: Nov/2022  Implant Site: Abdominal Laterality: Right  Catheter: Manufacturer: Medtronic Model: InDura Model No.: [id number]  Serial No.: n/a  Implanted Length (cm): 86.0  Catheter Volume (mL): 0.189  Tip Location (Level): T8 Canal Access Site: L2-3  Drug content:  Primary Medication Class:  Other   Medication:  PF-NSS   Concentration: n/a   Secondary Medication Class: none  Medication: none  Concentration: n/a   Tertiary Medication Class: none  Medication: none  Concentration: n/a   Fourth Medication Class: none  Medication: none  Concentration: n/a   PA parameters (PCA-mode):  Mode: Off (Inactive)  Programming:  Type: Simple continuous.  Medication, Concentration, Infusion Program, & Delivery Rate: For up-to-date details please see most recent scanned programming printout.   Changes:  Medication Change: None at this point Rate Change: No change in rate  Reported side-effects or adverse reactions: None reported  Effectiveness: Described as relatively effective, allowing for increase in activities of daily living (ADL) Clinically meaningful improvement in function (CMIF): Sustained CMIF goals met  Plan: Pump refill today   Pharmacotherapy Assessment   Analgesic: Hydromorphone 4 mg tablet, 1 tab p.o. 3 times daily (last filled on 04/02/2021) MME/day: 0 mg/day    Monitoring: [place] PMP: PDMP reviewed during this encounter.       Pharmacotherapy: No side-effects or adverse reactions reported. Compliance: No problems identified. Effectiveness: Clinically acceptable. Plan: Refer to "POC". UDS:  Summary  Date Value Ref Range Status  02/07/2021 Note  Final    Comment:    ==================================================================== Compliance Drug Analysis, Ur ==================================================================== Test  Result       Flag       Units  Drug Present and Declared for Prescription Verification   Pregabalin                     PRESENT      EXPECTED   Amitriptyline                  PRESENT      EXPECTED   Nortriptyline                  PRESENT      EXPECTED    Nortriptyline is an expected metabolite of amitriptyline.  Drug Absent but Declared for Prescription Verification   Duloxetine                     Not Detected UNEXPECTED   Promethazine                   Not Detected UNEXPECTED   Bupivacaine                    Not Detected UNEXPECTED   Clonidine                      Not Detected UNEXPECTED ==================================================================== Test                      Result    Flag   Units      Ref Range   Creatinine              108              mg/dL      >=20 ==================================================================== Declared Medications:  The flagging and interpretation on this report are based on the  following declared medications.  Unexpected results may arise from  inaccuracies in the declared medications.   **Note: The testing scope of this panel includes these medications:   Amitriptyline (Elavil)  Bupivacaine  Clonidine (Catapres)  Duloxetine (Cymbalta)  Pregabalin (Lyrica)  Promethazine (Phenergan)   **Note: The testing scope of this panel does not include the  following reported medications:   Albuterol (Ventolin HFA)  Bisacodyl  Levothyroxine (Synthroid)  Losartan (Cozaar)  Multivitamin  Naloxone (Narcan)  Simvastatin (Zocor) ==================================================================== For clinical consultation, please call [phone]. ====================================================================      Pre-op H&P Assessment:  [name] is a 75 y.o. (year old), male patient, seen today for interventional treatment. He  has a past surgical history that includes Coronary angioplasty; Back surgery; Nissen  fundoplication; Bladder tumor excision; Mastoidectomy; Colon surgery; Cervical fusion; Intrathecal pump implantation; Infusion pump replacement; Transurethral resection of bladder tumor (N/A, 03/07/2015); and Colonoscopy with propofol (N/A, 02/27/2018). [name] has a current medication list which includes the following prescription(s): albuterol, albuterol, albuterol sulfate, amitriptyline, azelastine, bisacodyl, cholecalciferol, duloxetine, [START ON 07/31/2021] hydromorphone, [START ON 08/30/2021] hydromorphone, levothyroxine, levothyroxine, losartan, multiple vitamin, naloxone, pregabalin, promethazine, promethazine, simvastatin, tiotropium bromide-olodaterol, vitamin e acetate, and [START ON 09/14/2021] PAIN MANAGEMENT IT PUMP REFILL. His primarily concern today is the Back Pain  Initial Vital Signs:  Pulse/HCG Rate: 79ECG Heart Rate: 64 Temp: (!) 97 F (36.1 C) Resp: 18 BP: (!) 147/90 SpO2: 93 %  BMI: Estimated body mass index is 27.22 kg/m as calculated from the following:   Height as of this encounter: _0  (1.88 m).   Weight as of this encounter: 212 lb (96.2 kg).  Risk Assessment: Allergies: Reviewed. He is allergic to codeine and penicillins.  Allergy Precautions: None required Coagulopathies: Reviewed. None identified.  Blood-thinner therapy: None at this time Active Infection(s): Reviewed. None identified. Mr. Edman is afebrile  Site Confirmation: Mr. Byrns was asked to confirm the procedure and laterality before marking the site Procedure checklist: Completed Consent: Before the procedure and under the influence of no sedative(s), amnesic(s), or anxiolytics, the patient was informed of the treatment options, risks and possible complications. To fulfill our ethical and legal obligations, as recommended by the American Medical Association's Code of Ethics, I have informed the patient of my clinical impression; the nature and purpose of the treatment or procedure; the risks, benefits, and  possible complications of the intervention; the alternatives, including doing nothing; the risk(s) and benefit(s) of the alternative treatment(s) or procedure(s); and the risk(s) and benefit(s) of doing nothing.  Mr. Meneely was provided with information about the general risks and possible complications associated with most interventional procedures. These include, but are not limited to: failure to achieve desired goals, infection, bleeding, organ or nerve damage, allergic reactions, paralysis, and/or death.  In addition, he was informed of those risks and possible complications associated to this particular procedure, which include, but are not limited to: damage to the implant; failure to decrease pain; local, systemic, or serious CNS infections, intraspinal abscess with possible cord compression and paralysis, or life-threatening such as meningitis; bleeding; organ damage; nerve injury or damage with subsequent sensory, motor, and/or autonomic system dysfunction, resulting in transient or permanent pain, numbness, and/or weakness of one or several areas of the body; allergic reactions, either minor or major life-threatening, such as anaphylactic or anaphylactoid reactions.  Furthermore, Mr. Chestnut was informed of those risks and complications associated with the medications. These include, but are not limited to: allergic reactions (i.e.: anaphylactic or anaphylactoid reactions); endorphine suppression; bradycardia and/or hypotension; water retention and/or peripheral vascular relaxation leading to lower extremity edema and possible stasis ulcers; respiratory depression and/or shortness of breath; decreased metabolic rate leading to weight gain; swelling or edema; medication-induced neural toxicity; particulate matter embolism and blood vessel occlusion with resultant organ, and/or nervous system infarction; and/or intrathecal granuloma formation with possible spinal cord compression and permanent  paralysis.  Before refilling the pump Mr. Zoeller was informed that some of the medications used in the devise may not be FDA approved for such use and therefore it constitutes an off-label use of the medications.  Finally, he was informed that Medicine is not an exact science; therefore, there is also the possibility of unforeseen or unpredictable risks and/or possible complications that may result in a catastrophic outcome. The patient indicated having understood very clearly. We have given the patient no guarantees and we have made no promises. Enough time was given to the patient to ask questions, all of which were answered to the patient's satisfaction. Mr. Cerro has indicated that he wanted to continue with the procedure. Attestation: I, the ordering provider, attest that I have discussed with the patient the benefits, risks, side-effects, alternatives, likelihood of achieving goals, and potential problems during recovery for the procedure that I have provided informed consent. Date  Time:   Pre-Procedure Preparation:  Monitoring: As per clinic protocol. Respiration, ETCO2, SpO2, BP, heart rate and rhythm monitor placed and checked for adequate function Safety Precautions: Patient was assessed for positional comfort and pressure points before starting the procedure. Time-out: I initiated and conducted the "Time-out" before starting the procedure, as per protocol. The patient was asked  to participate by confirming the accuracy of the "Time Out" information. Verification of the correct person, site, and procedure were performed and confirmed by me, the nursing staff, and the patient. "Time-out" conducted as per Joint Commission's Universal Protocol (UP.01.01.01). Time: 1229  Description of Procedure:          Position: Supine Target Area: Side-port of intrathecal pump. Approach: Anterior, 90 degree angle approach. Area Prepped: Entire Area around the pump implant. DuraPrep (Iodine Povacrylex  [0.7% available iodine] and Isopropyl Alcohol, 74% w/w) Safety Precautions: Aspiration looking for blood return was conducted prior to all injections. At no point did we inject any substances, as a needle was being advanced. No attempts were made at seeking any paresthesias. Safe injection practices and needle disposal techniques used. Medications properly checked for expiration dates. SDV (single dose vial) medications used. Description of the Procedure: Protocol guidelines were followed.  The patient was taken to the fluoroscopy suite where he was positioned supine over the fluoroscopy table.  Fluoroscopic guidance was then used to locate the target, and this case, the lateral access port of the intrathecal pump.  The area was prepped and 2% lidocaine was infiltrated above the target area.  A 25-gauge needle was then inserted into the side-port and aspiration was attempted.  Absolutely no return was obtained.  Since we were unable to obtain any CSF back, the assumption is that the catheter is either outside of the intrathecal space, fractured, occluded, or kinked somewhere along its path.  X-rays of the thoracic spine do show the catheter inside of the canal and x-rays of the lumbar spine show the catheter traveling from the pump all the way into the canal with no obvious problems associated with it, except at the point where it is anchored to the supraspinous ligament where it is difficult to tell if there is any type of interruption to its continuity at that point.  I have included images of the area below.    Vitals:   06/21/21 1236 06/21/21 1241 06/21/21 1246 06/21/21 1250  BP: 136/71 127/78 131/87 128/79  Pulse:      Resp: _0 Temp:      TempSrc:      SpO2: 95% 94% 94% 95%  Weight:      Height:        Start Time: 1229 hrs. End Time: 1249 hrs. Materials & Medications: Medtronic Refill Kit Medication(s): Please see chart orders for details.  Imaging Guidance:          Type of  Imaging Technique: Fluoroscopy Guidance (Non-spinal) Indication(s): Assistance in needle guidance and placement for procedures requiring needle placement in or near specific anatomical locations not easily accessible without such assistance. Exposure Time: Please see nurses notes. Contrast: Before injecting any contrast, we confirmed that the patient did not have an allergy to iodine, shellfish, or radiological contrast. Once satisfactory needle placement was completed at the desired level, radiological contrast was injected. Contrast injected under live fluoroscopy. No contrast complications. See chart for type and volume of contrast used. Fluoroscopic Guidance: I was personally present during the use of fluoroscopy. "Tunnel Vision Technique" used to obtain the best possible view of the target area. Parallax error corrected before commencing the procedure. "Direction-depth-direction" technique used to introduce the needle under continuous pulsed fluoroscopy. Once target was reached, antero-posterior, oblique, and lateral fluoroscopic projection used confirm needle placement in all planes. Images permanently stored in EMR. Ultrasound Guidance: N/A  Interpretation: I personally interpreted the imaging intraoperatively. Adequate needle placement confirmed in multiple planes. Appropriate spread of contrast into desired area was observed. No evidence of afferent or efferent intravascular uptake. No intrathecal or subarachnoid spread observed. Permanent images saved into the patient's record.  Antibiotic Prophylaxis:   Anti-infectives (From admission, onward)    None      Indication(s): None identified  Post-operative Assessment:  Post-procedure Vital Signs:  Pulse/HCG Rate: 7963 Temp: (!) 97 F (36.1 C) Resp: 17 BP: 128/79 SpO2: 95 %  EBL: None  Complications: No immediate post-treatment complications observed by team, or reported by patient.  Note: The patient tolerated  the entire procedure well. A repeat set of vitals were taken after the procedure and the patient was kept under observation following institutional policy, for this type of procedure. Post-procedural neurological assessment was performed, showing return to baseline, prior to discharge. The patient was provided with post-procedure discharge instructions, including a section on how to identify potential problems. Should any problems arise concerning this procedure, the patient was given instructions to immediately contact us, at any time, without hesitation. In any case, we plan to contact the patient by telephone for a follow-up status report regarding this interventional procedure.  Comments:  No additional relevant information.  Plan of Care  Orders:  Orders Placed This Encounter  Procedures   Misc procedure    Scheduling Instructions:     Procedure: Intrathecal pump and catheter contrast evaluation     Side: n/a     Sedation: No Sedation.     Timeframe: Today   DG PA[facility] C-ARM 1-60 MIN NO REPORT    Intraoperative interpretation by procedural physician at [facility] [place].    Standing Status:   Standing    Number of Occurrences:   1    Order Specific Question:   Reason for exam:    Answer:   Assistance in needle guidance and placement for procedures requiring needle placement in or near specific anatomical locations not easily accessible without such assistance.   Informed Consent Details: Physician/Practitioner Attestation; Transcribe to consent form and obtain patient signature    Nursing Order: Transcribe to consent form and obtain patient signature. Note: Always confirm laterality of pain with Mr. Geiman, before procedure.    Order Specific Question:   Physician/Practitioner attestation of informed consent for procedure/surgical case    Answer:   I, the physician/practitioner, attest that I have discussed with the patient the benefits, risks, side effects, alternatives,  likelihood of achieving goals and potential problems during recovery for the procedure that I have provided informed consent.    Order Specific Question:   Procedure    Answer:   Intrathecal pump and catheter contrast evaluation    Order Specific Question:   Physician/Practitioner performing the procedure    Answer:   Nelle Sayed A. Dossie Arbour, MD    Order Specific Question:   Indication/Reason    Answer:   Malfunction of intrathecal pump    Chronic Opioid Analgesic:  Hydromorphone 4 mg tablet, 1 tab p.o. 3 times daily (last filled on 04/02/2021) MME/day: 0 mg/day    Medications ordered for procedure: Meds ordered this encounter  Medications   iohexol (OMNIPAQUE) 180 MG/ML injection 10 mL    Must be Myelogram-compatible. If not available, you may substitute with a water-soluble, non-ionic, hypoallergenic, myelogram-compatible radiological contrast medium.   HYDROmorphone (DILAUDID) 4 MG tablet    Sig: Take 1 tablet (4 mg total) by mouth every 8 (eight) hours as needed. Must  last 30 days.    Dispense:  90 tablet    Refill:  0    DO NOT: delete (not duplicate); no partial-fill (will deny script to complete), no refill request (F/U required). DISPENSE: 1 day early if closed on fill date. WARN: No CNS-depressants within 8 hrs of med.   HYDROmorphone (DILAUDID) 4 MG tablet    Sig: Take 1 tablet (4 mg total) by mouth every 8 (eight) hours as needed. Must last 30 days.    Dispense:  90 tablet    Refill:  0    DO NOT: delete (not duplicate); no partial-fill (will deny script to complete), no refill request (F/U required). DISPENSE: 1 day early if closed on fill date. WARN: No CNS-depressants within 8 hrs of med.   PAIN MANAGEMENT IT PUMP REFILL    Sig: 1 each by Intrathecal route once for 1 dose. Medication: 1st Opioid: PF-Fentanyl 1000 mcg/ml Local Anesthetic: PF-Bupivacaine 20 mg/ml Volume: 40 ml Appointment Date: TBD    Dispense:  1 each    Refill:  0    To be used as a refill for the  intrathecal pumps by the pain management staff.    Medications administered: We administered iohexol.  See the medical record for exact dosing, route, and time of administration.  Follow-up plan:   Return in about 3 months (around 09/29/2021) for Eval-day (M,W), (F2F), (MM).     Interventional Therapies  Risk  Complexity Considerations:   Estimated body mass index is 27.6 kg/m as calculated from the following:   Height as of this encounter: _0  (1.88 m).   Weight as of this encounter: 215 lb (97.5 kg). WNL   Planned  Pending:   Pending surgical clearance by cardiology (Dr. Clayborn Bigness). Diagnostic intrathecal pump catheter test/evaluation (side-port aspiration and injection of contrast under fluoroscopy)  Intrathecal pump replacement by Dr. Lacinda Axon    Under consideration:   Diagnostic intrathecal pump catheter test/evaluation  Diagnostic/therapeutic bilateral lumbar facet MBB #1  Therapeutic/palliative left L4-5 LESI #1  Intrathecal pump evaluation and management Intrathecal pump replacement secondary to end of battery life. Referral entered on 02/07/2021 for [facility] neurosurgery Deetta Perla, MD) for intrathecal pump replacement.   Completed:   Diagnostic left caudal ESI and epidurogram x1 (04/24/2021) (LBP: 50/50/0  LEP; 100/100/50-60)    Therapeutic  Palliative (PRN) options:   None established      Recent Visits Date Type Provider Dept  06/07/21 Office Visit Milinda Pointer, MD [facility]  04/24/21 Procedure visit Milinda Pointer, MD [facility]  04/09/21 Office Visit Milinda Pointer, MD [facility]  Showing recent visits within past 90 days and meeting all other requirements Today's Visits Date Type Provider Dept  06/21/21 Procedure visit Milinda Pointer, MD [facility]  Showing today's visits and meeting all other requirements Future Appointments Date Type Provider Dept  09/19/21 Appointment Milinda Pointer, MD [facility]  Showing future appointments within next 90 days and meeting all other requirements Disposition: Discharge home  Discharge (Date  Time): 06/21/2021; 1254 hrs.   Primary Care Physician: Gabriel Hector, MD Location: [facility] Note by: Gabriel Cola, MD Date: 06/21/2021; Time: 3:55 PM  Disclaimer:  Medicine is not an Chief Strategy Officer. The only guarantee in medicine is that nothing is guaranteed. It is important to note that the decision to proceed with this intervention was based on the information collected from the patient. The Data and conclusions were drawn from  the patient's questionnaire, the interview, and the physical examination. Because the information was provided in large part by the patient, it cannot be guaranteed that it has not been purposely or unconsciously manipulated. Every effort has been made to obtain as much relevant data as possible for this evaluation. It is important to note that the conclusions that lead to this procedure are derived in large part from the available data. Always take into account that the treatment will also be dependent on availability of resources and existing treatment guidelines, considered by other Pain Management Practitioners as being common knowledge and practice, at the time of the intervention. For Medico-Legal purposes, it is also important to point out that variation in procedural techniques and pharmacological choices are the acceptable norm. The indications, contraindications, technique, and results of the above procedure should only be interpreted and judged by a Board-Certified Interventional Pain Specialist with extensive familiarity and expertise in the same exact procedure and technique.

## 2021-06-21 NOTE — Progress Notes (Signed)
Safety precautions to be maintained throughout the outpatient stay will include: orient to surroundings, keep bed in low position, maintain call bell within reach at all times, provide assistance with transfer out of bed and ambulation.  

## 2021-06-21 NOTE — Patient Instructions (Signed)

## 2021-06-25 ENCOUNTER — Encounter: Payer: Medicare Other | Admitting: Pain Medicine

## 2021-07-25 ENCOUNTER — Encounter: Payer: Medicare Other | Admitting: Pain Medicine

## 2021-09-17 NOTE — Progress Notes (Signed)
PROVIDER NOTE: Information contained herein reflects review and annotations entered in association with encounter. Interpretation of such information and data should be left to medically-trained personnel. Information provided to patient can be located elsewhere in the medical record under "Patient Instructions". Document created using STT-dictation technology, any transcriptional errors that may result from process are unintentional.    Patient: [name]  Service Category: E/M  Provider: [name], MD  DOB: [birth year]  DOS: 09/19/2021  Specialty: Interventional Pain Management  MRN: [id number]  Setting: Ambulatory outpatient  PCP: Adin Hector, MD  Type: Established Patient    Referring Provider: Adin Hector, MD  Location: Office  Delivery: Face-to-face     HPI  [name], a 76 y.o. year old male, is here today because of his Chronic pain syndrome [G89.4]. [name] primary complain today is Back Pain, Shoulder Pain, and Neck Pain Last encounter: My last encounter with him was on 06/21/2021. 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[place]); Chronic ankle pain (Bilateral); Pain in joint, ankle and foot; Polyneuropathy associated with underlying disease ([place]); Presence of intrathecal pump; Lumbosacral radiculopathy at S1 (Bilateral) (R>L); Chronic feet pain (3ry area of Pain) (Bilateral) (R>L); Chronic knee pain (4th area of Pain) (Bilateral) (R>L); Failed back surgical syndrome; Lower extremity weakness (Bilateral); Chronic neuropathic pain; Peripheral neurogenic pain; Nonfamilial nocturnal leg cramps (Bilateral); Restless leg syndrome; Malfunction of intrathecal infusion pump; End of battery  life of intrathecal infusion pump; Pain in right knee; DDD (degenerative disc disease), lumbosacral; Abnormal MRI, lumbar spine (11/01/2019); 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 Pain Assessment: Severity of Chronic pain is reported as a 8 /10. Location: Back Lower/radiates down both legs on the side to feet.. Onset: More than a month ago. Quality: Sharp, Stabbing. Timing: Constant. Modifying factor(s): nothing. Vitals:  height is 6' 2"  (1.88 m) and weight is 215 lb (97.5 kg). His temperature is 97.3 F (36.3 C) (abnormal). His blood pressure is 144/87 (abnormal) and his pulse is 41 (abnormal). His respiration is 18 and oxygen saturation is 95%.   Reason for encounter: medication management. The patient comes into the clinic today using a walker. The patient indicates doing well with the current medication regimen. No adverse reactions or side effects reported to the medications.  Unfortunately, he forgot to bring his pills to be inspected encounter.  For this reason, I will be prescribing only 30 days of the pain medicine and he was reminded to bring the pills for the count.  On the patient's last visit, we had talked to him about seeing his cardiologist, which I was under the impression that he was going to contact.  The reason for our interest in having him see his cardiologist is because while we were doing the caudal epidural steroid injection on 04/24/2021 and later on the intrathecal pump catheter study on 06/21/2021, while we were monitoring  him during the procedure he had evidence of cardiac arrhythmias which  included ventricular bigeminy.  At the time he had indicated that he is cardiologist was Dr. Lujean Amel.  A quick review of "Care Everywhere" revealed that on 12/25/2020 he had undergone "ambulatory ECG analysis".  [name] indicates that he had been told that if there was anything abnormal, that they would be giving him a call.  He refers never having had any feedback from the study.  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  The results of the study are not available to me at this time.  Due to the above reasons, we will be requesting a referral to Dr. Lujean Amel for cardiac clearance so that we can proceed to treat him with the interventional therapy as needed to control his pain.  In addition, the patient currently has a nonfunctional intrathecal pump that he wants removed.  This clearance should also include clearance for that surgery.  On 04/24/2021 the patient underwent a caudal epidural steroid injection which did provide him with significant relief of his lower extremity pain.  However, some of that is beginning to come back.  Today his primary complaint is that of neck pain (Bilateral) (R>L) with shoulder pain (Bilateral) (R>L).  He also complains of occasional shooting pain on the right side of his neck that travels to the area behind his right ear and towards the top of the head and what appears to be a right lesser occipital neuralgia.  He also indicates  decreased range of motion of the cervical spine that seems to be painful.  Review of the medical record shows a CT of the cervical spine done on 08/26/2019 showing cervical facet hypertrophy (Multilevel) (Bilateral).  On the right side he seems to have facet neuropathy primarily at the C3-4 level.  The CT also demonstrated bilateral C4-5 facet arthropathy (L>R).  Since the origin of the occipital nerve is at the C2, C3, and TON (third occipital nerve) levels, it is possible that this facet arthropathy is responsible for the shooting pains that he has been experiencing over the distribution of the occipital nerve on the right side.  Today we will be ordering x-rays of both shoulders and the cervical spine with flexion and extension views.  We will also order an MRI of the cervical spine.  The plan was shared with the patient who understood and accepted.  UDS ordered today.   RTCB: 10/29/2021  Pharmacotherapy Assessment  Analgesic: Hydromorphone 4 mg tablet, 1 tab p.o. 3 times daily (last filled on 04/02/2021) MME/day: 0 mg/day    Monitoring: [place] PMP: PDMP reviewed during this encounter.       Pharmacotherapy: No side-effects or adverse reactions reported. Compliance: No problems identified. Effectiveness: Clinically acceptable.  Dewayne Shorter, RN  09/19/2021 11:50 AM  Sign when Signing Visit Nursing Pain Medication Assessment:  Safety precautions to be maintained throughout the outpatient stay will include: orient to surroundings, keep bed in low position, maintain call bell within reach at all times, provide assistance with transfer out of bed and ambulation.  Medication Inspection Compliance: [name] did not comply with our request to bring his pills to be counted. He was reminded that bringing the  medication bottles, even when empty, is a requirement.  Medication: None brought in. Pill/Patch Count: None available to be counted. Bottle Appearance: No container available. Did not bring bottle(s) to  appointment. Filled Date: N/A Last Medication intake:  Yesterday    UDS:  Summary  Date Value Ref Range Status  02/07/2021 Note  Final    Comment:    ==================================================================== Compliance Drug Analysis, Ur ==================================================================== Test                             Result       Flag       Units  Drug Present and Declared for Prescription Verification   Pregabalin                     PRESENT      EXPECTED   Amitriptyline                  PRESENT      EXPECTED   Nortriptyline                  PRESENT      EXPECTED    Nortriptyline is an expected metabolite of amitriptyline.  Drug Absent but Declared for Prescription Verification   Duloxetine                     Not Detected UNEXPECTED   Promethazine                   Not Detected UNEXPECTED   Bupivacaine                    Not Detected UNEXPECTED   Clonidine                      Not Detected UNEXPECTED ==================================================================== Test                      Result    Flag   Units      Ref Range   Creatinine              108              mg/dL      >=20 ==================================================================== Declared Medications:  The flagging and interpretation on this report are based on the  following declared medications.  Unexpected results may arise from  inaccuracies in the declared medications.   **Note: The testing scope of this panel includes these medications:   Amitriptyline (Elavil)  Bupivacaine  Clonidine (Catapres)  Duloxetine (Cymbalta)  Pregabalin (Lyrica)  Promethazine (Phenergan)   **Note: The testing scope of this panel does not include the  following reported medications:   Albuterol (Ventolin HFA)  Bisacodyl  Levothyroxine (Synthroid)  Losartan (Cozaar)  Multivitamin  Naloxone (Narcan)  Simvastatin  (Zocor) ==================================================================== For clinical consultation, please call [phone]. ====================================================================      ROS  Constitutional: Denies any fever or chills Gastrointestinal: No reported hemesis, hematochezia, vomiting, or acute GI distress Musculoskeletal: Denies any acute onset joint swelling, redness, loss of ROM, or weakness Neurological: No reported episodes of acute onset apraxia, aphasia, dysarthria, agnosia, amnesia, paralysis, loss of coordination, or loss of consciousness  Medication Review  Albuterol Sulfate, DULoxetine, HYDROmorphone, Multiple Vitamin, Tiotropium Bromide-Olodaterol, albuterol, amitriptyline, azelastine, bisacodyl, cloNIDine, latanoprost, levothyroxine, losartan, naloxone, pregabalin, promethazine, and simvastatin  History Review  Allergy: [name] is allergic  to celebrex [celecoxib], codeine, and penicillins. Drug: [name]  reports no history of drug use. Alcohol:  reports no history of alcohol use. Tobacco:  reports that he has been smoking cigarettes. He has been smoking an average of 1 pack per day. He has never used smokeless tobacco. Social: Mr. Medeiros  reports that he has been smoking cigarettes. He has been smoking an average of 1 pack per day. He has never used smokeless tobacco. He reports that he does not drink alcohol and does not use drugs. Medical:  has a past medical history of Anxiety, Arthritis, Asthma, Cancer ([place]), CHF (congestive heart failure) ([place]), COPD (chronic obstructive pulmonary disease) ([place]), Coronary artery disease, Depression, Diabetes mellitus without complication ([place]), DVT of lower extremity, bilateral ([place]), Dysrhythmia, Hypertension, Neuropathy, Pain, Peripheral vascular disease ([place]), S/P IVC filter, Sleep apnea, Sleep disorder, and Stasis dermatitis of both legs. Surgical: Mr. Callanan  has a past surgical history that includes Coronary  angioplasty; Back surgery; Nissen fundoplication; Bladder tumor excision; Mastoidectomy; Colon surgery; Cervical fusion; Intrathecal pump implantation; Infusion pump replacement; Transurethral resection of bladder tumor (N/A, 03/07/2015); and Colonoscopy with propofol (N/A, 02/27/2018). Family: family history includes Emphysema in his father and sister; Heart attack in his father; Heart disease in his brother and mother.  Laboratory Chemistry Profile   Renal Lab Results  Component Value Date   BUN 10 02/07/2021   CREATININE 0.94 02/07/2021   BCR 11 02/07/2021   GFRAA >60 03/02/2015   GFRNONAA >60 10/04/2020    Hepatic Lab Results  Component Value Date   AST 18 02/07/2021   ALBUMIN 4.2 02/07/2021   ALKPHOS 123 (H) 02/07/2021    Electrolytes Lab Results  Component Value Date   NA 142 02/07/2021   K 4.8 02/07/2021   CL 103 02/07/2021   CALCIUM 9.2 02/07/2021   MG 2.2 02/07/2021    Bone Lab Results  Component Value Date   25OHVITD1 22 (L) 02/07/2021   25OHVITD2 <1.0 02/07/2021   25OHVITD3 22 02/07/2021    Inflammation (CRP: Acute Phase) (ESR: Chronic Phase) Lab Results  Component Value Date   CRP 3 02/07/2021   ESRSEDRATE 6 02/07/2021         Note: Above Lab results reviewed.  Recent Imaging Review  DG PAIN CLINIC C-ARM 1-60 MIN NO REPORT Fluoro was used, but no Radiologist interpretation will be provided.  Please refer to "NOTES" tab for provider progress note. Note: Reviewed        Physical Exam  General appearance: Well nourished, well developed, and well hydrated. In no apparent acute distress Mental status: Alert, oriented x 3 (person, place, & time)       Respiratory: No evidence of acute respiratory distress Eyes: PERLA Vitals: BP (!) 144/87    Pulse (!) 41    Temp (!) 97.3 F (36.3 C)    Resp 18    Ht 6' 2"  (1.88 m)    Wt 215 lb (97.5 kg)    SpO2 95%    BMI 27.60 kg/m  BMI: Estimated body mass index is 27.6 kg/m as calculated from the following:   Height  as of this encounter: 6' 2"  (1.88 m).   Weight as of this encounter: 215 lb (97.5 kg). Ideal: Ideal body weight: 82.2 kg (181 lb 3.5 oz) Adjusted ideal body weight: 88.3 kg (194 lb 11.7 oz)  Assessment   Status Diagnosis  Controlled Controlled Controlled 1. Chronic pain syndrome   2. Chronic low back pain (1ry area of Pain) (Bilateral) (  R>L) w/ sciatica (Bilateral)   3. Chronic lower extremity pain (2ry area of Pain) (Bilateral) (R>L)   4. Failed back surgical syndrome   5. Chronic feet pain (3ry area of Pain) (Bilateral) (R>L)   6. Chronic knee pain (4th area of Pain) (Bilateral) (R>L)   7. Lumbar post-laminectomy syndrome   8. Cardiac arrhythmia, unspecified cardiac arrhythmia type   9. Presence of intrathecal pump   10. End of battery life of intrathecal infusion pump   11. Malfunction of intrathecal infusion pump, sequela   12. Cervicalgia   13. Chronic shoulder pain (Bilateral) (R>L)   14. Radicular pain of shoulder   15. Occipital neuralgia (Right)   16. Cervico-occipital neuralgia (Right)   17. Cervical facet hypertrophy (Multilevel) (Bilateral)   18. Cervical facet pain   19. Painful cervical range of motion   20. Pharmacologic therapy   21. Chronic use of opiate for therapeutic purpose   22. Encounter for medication management   23. Encounter for chronic pain management      Updated Problems: Problem  Cervicalgia  Chronic shoulder pain (Bilateral) (R>L)  Radicular Pain of Shoulder  Occipital neuralgia (Right)  Cervico-occipital neuralgia (Right)  Cervical facet hypertrophy (Multilevel) (Bilateral)  Cervical facet pain  Painful Cervical Range of Motion  Cardiac Dysrhythmia    Plan of Care  Problem-specific:  No problem-specific Assessment & Plan notes found for this encounter.  [name] has a current medication list which includes the following long-term medication(s): albuterol, albuterol, albuterol sulfate, amitriptyline, azelastine, duloxetine,  levothyroxine, losartan, promethazine, simvastatin, and [START ON 09/29/2021] hydromorphone.  Pharmacotherapy (Medications Ordered): Meds ordered this encounter  Medications   HYDROmorphone (DILAUDID) 4 MG tablet    Sig: Take 1 tablet (4 mg total) by mouth every 8 (eight) hours as needed. Must last 30 days.    Dispense:  90 tablet    Refill:  0    DO NOT: delete (not duplicate); no partial-fill (will deny script to complete), no refill request (F/U required). DISPENSE: 1 day early if closed on fill date. WARN: No CNS-depressants within 8 hrs of med.   Orders:  Orders Placed This Encounter  Procedures   DG Shoulder Right    Standing Status:   Future    Standing Expiration Date:   10/20/2021    Scheduling Instructions:     Imaging must be done as soon as possible. Inform patient that order will expire within 30 days and I will not renew it.    Order Specific Question:   Reason for Exam (SYMPTOM  OR DIAGNOSIS REQUIRED)    Answer:   Right shoulder pain    Order Specific Question:   Preferred imaging location?    Answer:   [facility]    Order Specific Question:   Call Results- Best Contact Number?    Answer:   [phone] ([facility])    Order Specific Question:   Release to patient    Answer:   Immediate   DG Shoulder Left    Standing Status:   Future    Standing Expiration Date:   10/20/2021    Scheduling Instructions:     Imaging must be done as soon as possible. Inform patient that order will expire within 30 days and I will not renew it.    Order Specific Question:   Reason for Exam (SYMPTOM  OR DIAGNOSIS REQUIRED)    Answer:   Left shoulder pain    Order Specific Question:   Preferred imaging  location?    Answer:   [facility]    Order Specific Question:   Call Results- Best Contact Number?    Answer:   [phone] ([facility])    Order Specific Question:   Release to patient    Answer:   Immediate   DG Cervical Spine With Flex & Extend    Patient  presents with axial pain with possible radicular component.  Please evaluate for any evidence of cervical spine instability. Describe the presence of any spondylolisthesis (Antero- or retrolisthesis). If present, provide displacement "Grade" and measurement in cm. Please describe presence and specific location (Level & Laterality) of any signs of  osteoarthritis, zygapophyseal (Facet) joints DJD (including decreased joint space and/or osteophytosis), DDD, Foraminal narrowing, as well as any sclerosis and/or cyst formation. Please comment on ROM. Patient presents with axial pain with possible radicular component. Please assist [place] in identifying specific level(s) and laterality of any additional findings such as: 1. Facet (Zygapophyseal) joint DJD (Hypertrophy, space narrowing, subchondral sclerosis, and/or osteophyte formation) 2. DDD and/or IVDD (Loss of disc height, desiccation, gas patterns, osteophytes, endplate sclerosis, or "Black disc disease") 3. Pars defects 4. Spondylolisthesis, spondylosis, and/or spondyloarthropathies (include Degree/Grade of displacement in mm) (stability) 5. Vertebral body Fractures (acute/chronic) (state percentage of collapse) 6. Demineralization (osteopenia/osteoporotic) 7. Bone pathology 8. Foraminal narrowing  9. Surgical changes    Standing Status:   Future    Standing Expiration Date:   10/20/2021    Scheduling Instructions:     Imaging must be done as soon as possible. Inform patient that order will expire within 30 days and I will not renew it.    Order Specific Question:   Reason for Exam (SYMPTOM  OR DIAGNOSIS REQUIRED)    Answer:   Cervicalgia    Order Specific Question:   Preferred imaging location?    Answer:   [facility]    Order Specific Question:   Call Results- Best Contact Number?    Answer:   [phone] ([facility])    Order Specific Question:   Radiology Contrast Protocol - do NOT remove file path    Answer:    \[email or web address]    Order Specific Question:   Release to patient    Answer:   Immediate   MR CERVICAL SPINE WO CONTRAST    Patient presents with axial pain with possible radicular component. Please assist [place] in identifying specific level(s) and laterality of any additional findings such as: 1. Facet (Zygapophyseal) joint DJD (Hypertrophy, space narrowing, subchondral sclerosis, and/or osteophyte formation) 2. DDD and/or IVDD (Loss of disc height, desiccation, gas patterns, osteophytes, endplate sclerosis, or "Black disc disease") 3. Pars defects 4. Spondylolisthesis, spondylosis, and/or spondyloarthropathies (include Degree/Grade of displacement in mm) (stability) 5. Vertebral body Fractures (acute/chronic) (state percentage of collapse) 6. Demineralization (osteopenia/osteoporotic) 7. Bone pathology 8. Foraminal narrowing  9. Surgical changes 10. Central, Lateral Recess, and/or Foraminal Stenosis (include AP diameter of stenosis in mm) 11. Surgical changes (hardware type, status, and presence of fibrosis) 12. Modic Type Changes (MRI only) 13. IVDD (Disc bulge, protrusion, herniation, extrusion) (Level, laterality, extent)    Standing Status:   Future    Standing Expiration Date:   10/20/2021    Scheduling Instructions:     Imaging must be done as soon as possible. Inform patient that order will expire within 30 days and I will not renew it.    Order Specific Question:   What is the patient's sedation requirement?    Answer:  No Sedation    Order Specific Question:   Does the patient have a pacemaker or implanted devices?    Answer:   No    Order Specific Question:   Preferred imaging location?    Answer:   [facility] (table limit-350lbs)    Order Specific Question:   Call Results- Best Contact Number?    Answer:   [phone] ([facility])    Order Specific Question:   Radiology Contrast Protocol - do NOT remove file path     Answer:   \[email or web address]   ToxASSURE Select 13 (MW), Urine    Volume: 30 ml(s). Minimum 3 ml of urine is needed. Document temperature of fresh sample. Indications: Long term (current) use of opiate analgesic (Z79.891)    Order Specific Question:   Release to patient    Answer:   Immediate   Ambulatory referral to Cardiology    Referral Priority:   Routine    Referral Type:   Consultation    Referral Reason:   Specialty Services Required    Referred to Provider:   Yolonda Kida, MD    Requested Specialty:   Cardiology    Number of Visits Requested:   1   Ambulatory referral to Neurosurgery    Referral Priority:   Routine    Referral Type:   Surgical    Referral Reason:   Specialty Services Required    Referred to Provider:   Erline Levine, MD    Requested Specialty:   Neurosurgery    Number of Visits Requested:   1   Follow-up plan:   Return in 6 weeks (on 10/29/2021) for Eval-day (M,W), (F2F), (MM), for review of ordered tests.     Interventional Therapies  Risk   Complexity Considerations:   Estimated body mass index is 27.6 kg/m as calculated from the following:   Height as of this encounter: 6' 2"  (1.88 m).   Weight as of this encounter: 215 lb (97.5 kg). WNL   Planned   Pending:   Pending surgical clearance by cardiology (Dr. Clayborn Bigness). Diagnostic intrathecal pump catheter test/evaluation (side-port aspiration and injection of contrast under fluoroscopy)  Intrathecal pump replacement by Dr. Lacinda Axon    Under consideration:   Diagnostic intrathecal pump catheter test/evaluation  Diagnostic/therapeutic bilateral lumbar facet MBB #1  Therapeutic/palliative left L4-5 LESI #1  Intrathecal pump evaluation and management Intrathecal pump replacement secondary to end of battery life. Referral entered on 02/07/2021 for [facility] neurosurgery Deetta Perla, MD) for intrathecal pump replacement.   Completed:   Diagnostic left caudal ESI and  epidurogram x1 (04/24/2021) (LBP: 50/50/0   LEP; 100/100/50-60)    Therapeutic   Palliative (PRN) options:   None established    Recent Visits Date Type Provider Dept  06/21/21 Procedure visit Milinda Pointer, MD [facility]  Showing recent visits within past 90 days and meeting all other requirements Today's Visits Date Type Provider Dept  09/19/21 Office Visit Milinda Pointer, MD [facility]  Showing today's visits and meeting all other requirements Future Appointments No visits were found meeting these conditions. Showing future appointments within next 90 days and meeting all other requirements  I discussed the assessment and treatment plan with the patient. The patient was provided an opportunity to ask questions and all were answered. The patient agreed with the plan and demonstrated an understanding of the instructions.  Patient advised to call back or seek an in-person evaluation if the symptoms or condition worsens.  Duration of encounter:  30 minutes.  Note by: [name], MD Date: 09/19/2021; Time: 12:20 PM

## 2021-09-19 ENCOUNTER — Ambulatory Visit: Payer: Medicare (Managed Care) | Attending: Pain Medicine | Admitting: Pain Medicine

## 2021-09-19 ENCOUNTER — Encounter: Payer: Self-pay | Admitting: Pain Medicine

## 2021-09-19 ENCOUNTER — Other Ambulatory Visit: Payer: Self-pay

## 2021-09-19 VITALS — BP 144/87 | HR 41 | Temp 97.3°F | Resp 18 | Ht 74.0 in | Wt 215.0 lb

## 2021-09-19 DIAGNOSIS — I499 Cardiac arrhythmia, unspecified: Secondary | ICD-10-CM

## 2021-09-19 DIAGNOSIS — Z79899 Other long term (current) drug therapy: Secondary | ICD-10-CM

## 2021-09-19 DIAGNOSIS — M5441 Lumbago with sciatica, right side: Secondary | ICD-10-CM

## 2021-09-19 DIAGNOSIS — M25512 Pain in left shoulder: Secondary | ICD-10-CM | POA: Diagnosis present

## 2021-09-19 DIAGNOSIS — Z79891 Long term (current) use of opiate analgesic: Secondary | ICD-10-CM

## 2021-09-19 DIAGNOSIS — M5481 Occipital neuralgia: Secondary | ICD-10-CM

## 2021-09-19 DIAGNOSIS — Z978 Presence of other specified devices: Secondary | ICD-10-CM

## 2021-09-19 DIAGNOSIS — M25561 Pain in right knee: Secondary | ICD-10-CM | POA: Diagnosis present

## 2021-09-19 DIAGNOSIS — M47812 Spondylosis without myelopathy or radiculopathy, cervical region: Secondary | ICD-10-CM

## 2021-09-19 DIAGNOSIS — G894 Chronic pain syndrome: Secondary | ICD-10-CM | POA: Diagnosis present

## 2021-09-19 DIAGNOSIS — G8929 Other chronic pain: Secondary | ICD-10-CM

## 2021-09-19 DIAGNOSIS — M79672 Pain in left foot: Secondary | ICD-10-CM | POA: Diagnosis present

## 2021-09-19 DIAGNOSIS — T85610S Breakdown (mechanical) of epidural and subdural infusion catheter, sequela: Secondary | ICD-10-CM | POA: Insufficient documentation

## 2021-09-19 DIAGNOSIS — M79605 Pain in left leg: Secondary | ICD-10-CM | POA: Insufficient documentation

## 2021-09-19 DIAGNOSIS — M25562 Pain in left knee: Secondary | ICD-10-CM | POA: Insufficient documentation

## 2021-09-19 DIAGNOSIS — M5412 Radiculopathy, cervical region: Secondary | ICD-10-CM

## 2021-09-19 DIAGNOSIS — M5442 Lumbago with sciatica, left side: Secondary | ICD-10-CM | POA: Insufficient documentation

## 2021-09-19 DIAGNOSIS — M79671 Pain in right foot: Secondary | ICD-10-CM

## 2021-09-19 DIAGNOSIS — M25511 Pain in right shoulder: Secondary | ICD-10-CM | POA: Diagnosis present

## 2021-09-19 DIAGNOSIS — M961 Postlaminectomy syndrome, not elsewhere classified: Secondary | ICD-10-CM | POA: Diagnosis not present

## 2021-09-19 DIAGNOSIS — M79604 Pain in right leg: Secondary | ICD-10-CM

## 2021-09-19 DIAGNOSIS — Z462 Encounter for fitting and adjustment of other devices related to nervous system and special senses: Secondary | ICD-10-CM | POA: Diagnosis present

## 2021-09-19 DIAGNOSIS — T85615S Breakdown (mechanical) of other nervous system device, implant or graft, sequela: Secondary | ICD-10-CM

## 2021-09-19 DIAGNOSIS — M542 Cervicalgia: Secondary | ICD-10-CM

## 2021-09-19 MED ORDER — HYDROMORPHONE HCL 4 MG PO TABS: 4.0000 mg | ORAL_TABLET | Freq: Three times a day (TID) | ORAL | 0 refills | Status: DC | PRN

## 2021-09-19 NOTE — Progress Notes (Signed)
Nursing Pain Medication Assessment:  Safety precautions to be maintained throughout the outpatient stay will include: orient to surroundings, keep bed in low position, maintain call bell within reach at all times, provide assistance with transfer out of bed and ambulation.  Medication Inspection Compliance: [name] did not comply with our request to bring his pills to be counted. He was reminded that bringing the medication bottles, even when empty, is a requirement.  Medication: None brought in. Pill/Patch Count: None available to be counted. Bottle Appearance: No container available. Did not bring bottle(s) to appointment. Filled Date: N/A Last Medication intake:  Yesterday 

## 2021-09-19 NOTE — Patient Instructions (Signed)
____________________________________________________________________________________________ ° °Medication Rules ° °Purpose: To inform patients, and their family members, of our rules and regulations. ° °Applies to: All patients receiving prescriptions (written or electronic). ° °Pharmacy of record: Pharmacy where electronic prescriptions will be sent. If written prescriptions are taken to a different pharmacy, please inform the nursing staff. The pharmacy listed in the electronic medical record should be the one where you would like electronic prescriptions to be sent. ° °Electronic prescriptions: In compliance with the [place] [employer]rengthen Opioid Misuse Prevention (STOP) Act of 2017 (Session Law 2017-74/H243), effective July 22, 2018, all controlled substances must be electronically prescribed. Calling prescriptions to the pharmacy will cease to exist. ° °Prescription refills: Only during scheduled appointments. Applies to all prescriptions. ° °NOTE: The following applies primarily to controlled substances (Opioid* Pain Medications).  ° °Type of encounter (visit): For patients receiving controlled substances, face-to-face visits are required. (Not an option or up to the patient.) ° °Patient's responsibilities: °Pain Pills: Bring all pain pills to every appointment (except for procedure appointments). °Pill Bottles: Bring pills in original pharmacy bottle. Always bring the newest bottle. Bring bottle, even if empty. °Medication refills: You are responsible for knowing and keeping track of what medications you take and those you need refilled. °The day before your appointment: write a list of all prescriptions that need to be refilled. °The day of the appointment: give the list to the admitting nurse. Prescriptions will be written only during appointments. No prescriptions will be written on procedure days. °If you forget a medication: it will not be "Called in", "Faxed", or "electronically sent". You will  need to get another appointment to get these prescribed. °No early refills. Do not call asking to have your prescription filled early. °Prescription Accuracy: You are responsible for carefully inspecting your prescriptions before leaving our office. Have the discharge nurse carefully go over each prescription with you, before taking them home. Make sure that your name is accurately spelled, that your address is correct. Check the name and dose of your medication to make sure it is accurate. Check the number of pills, and the written instructions to make sure they are clear and accurate. Make sure that you are given enough medication to last until your next medication refill appointment. °Taking Medication: Take medication as prescribed. When it comes to controlled substances, taking less pills or less frequently than prescribed is permitted and encouraged. °Never take more pills than instructed. °Never take medication more frequently than prescribed.  °Inform other Doctors: Always inform, all of your healthcare providers, of all the medications you take. °Pain Medication from other Providers: You are not allowed to accept any additional pain medication from any other Doctor or Healthcare provider. There are two exceptions to this rule. (see below) In the event that you require additional pain medication, you are responsible for notifying us, as stated below. °Cough Medicine: Often these contain an opioid, such as codeine or hydrocodone. Never accept or take cough medicine containing these opioids if you are already taking an opioid* medication. The combination may cause respiratory failure and death. °Medication Agreement: You are responsible for carefully reading and following our Medication Agreement. This must be signed before receiving any prescriptions from our practice. Safely store a copy of your signed Agreement. Violations to the Agreement will result in no further prescriptions. (Additional copies of our  Medication Agreement are available upon request.) °Laws, Rules, & Regulations: All patients are expected to follow all Federal and State Laws, Statutes, Rules, & Regulations. Ignorance of   the Laws does not constitute a valid excuse.  °Illegal drugs and Controlled Substances: The use of illegal substances (including, but not limited to marijuana and its derivatives) and/or the illegal use of any controlled substances is strictly prohibited. Violation of this rule may result in the immediate and permanent discontinuation of any and all prescriptions being written by our practice. The use of any illegal substances is prohibited. °Adopted CDC guidelines & recommendations: Target dosing levels will be at or below 60 MME/day. Use of benzodiazepines** is not recommended. ° °Exceptions: There are only two exceptions to the rule of not receiving pain medications from other Healthcare Providers. °Exception #1 (Emergencies): In the event of an emergency (i.e.: accident requiring emergency care), you are allowed to receive additional pain medication. However, you are responsible for: As soon as you are able, call our office [phone], at any time of the day or night, and leave a message stating your name, the date and nature of the emergency, and the name and dose of the medication prescribed. In the event that your call is answered by a member of our staff, make sure to document and save the date, time, and the name of the person that took your information.  °Exception #2 (Planned Surgery): In the event that you are scheduled by another doctor or dentist to have any type of surgery or procedure, you are allowed (for a period no longer than 30 days), to receive additional pain medication, for the acute post-op pain. However, in this case, you are responsible for picking up a copy of our "Post-op Pain Management for Surgeons" handout, and giving it to your surgeon or dentist. This document is available at our office, and  does not require an appointment to obtain it. Simply go to our office during business hours (Monday-Thursday from 8:00 AM to 4:00 PM) (Friday 8:00 AM to 12:00 Noon) or if you have a scheduled appointment with us, prior to your surgery, and ask for it by name. In addition, you are responsible for: calling our office [phone], at any time of the day or night, and leaving a message stating your name, name of your surgeon, type of surgery, and date of procedure or surgery. Failure to comply with your responsibilities may result in termination of therapy involving the controlled substances. °Medication Agreement Violation. Following the above rules, including your responsibilities will help you in avoiding a Medication Agreement Violation (“Breaking your Pain Medication Contract”). ° °*Opioid medications include: morphine, codeine, oxycodone, oxymorphone, hydrocodone, hydromorphone, meperidine, tramadol, tapentadol, buprenorphine, fentanyl, methadone. °**Benzodiazepine medications include: diazepam (Valium), alprazolam (Xanax), clonazepam (Klonopine), lorazepam (Ativan), clorazepate (Tranxene), chlordiazepoxide (Librium), estazolam (Prosom), oxazepam (Serax), temazepam (Restoril), triazolam (Halcion) °(Last updated: 04/18/2021) °____________________________________________________________________________________________ ° ____________________________________________________________________________________________ ° °Medication Recommendations and Reminders ° °Applies to: All patients receiving prescriptions (written and/or electronic). ° °Medication Rules & Regulations: These rules and regulations exist for your safety and that of others. They are not flexible and neither are we. Dismissing or ignoring them will be considered "non-compliance" with medication therapy, resulting in complete and irreversible termination of such therapy. (See document titled "Medication Rules" for more details.) In all conscience,  because of safety reasons, we cannot continue providing a therapy where the patient does not follow instructions. ° °Pharmacy of record:  °Definition: This is the pharmacy where your electronic prescriptions will be sent.  °We do not endorse any particular pharmacy, however, we have experienced problems with [facility] not securing enough medication supply for the community. °We do not restrict you   in your choice of pharmacy. However, once we write for your prescriptions, we will NOT be re-sending more prescriptions to fix restricted supply problems created by your pharmacy, or your insurance.  °The pharmacy listed in the electronic medical record should be the one where you want electronic prescriptions to be sent. °If you choose to change pharmacy, simply notify our nursing staff. ° °Recommendations: °Keep all of your pain medications in a safe place, under lock and key, even if you live alone. We will NOT replace lost, stolen, or damaged medication. °After you fill your prescription, take 1 week's worth of pills and put them away in a safe place. You should keep a separate, properly labeled bottle for this purpose. The remainder should be kept in the original bottle. Use this as your primary supply, until it runs out. Once it's gone, then you know that you have 1 week's worth of medicine, and it is time to come in for a prescription refill. If you do this correctly, it is unlikely that you will ever run out of medicine. °To make sure that the above recommendation works, it is very important that you make sure your medication refill appointments are scheduled at least 1 week before you run out of medicine. To do this in an effective manner, make sure that you do not leave the office without scheduling your next medication management appointment. Always ask the nursing staff to show you in your prescription , when your medication will be running out. Then arrange for the receptionist to get you a return appointment,  at least 7 days before you run out of medicine. Do not wait until you have 1 or 2 pills left, to come in. This is very poor planning and does not take into consideration that we may need to cancel appointments due to bad weather, sickness, or emergencies affecting our staff. °DO NOT ACCEPT A "Partial Fill": If for any reason your pharmacy does not have enough pills/tablets to completely fill or refill your prescription, do not allow for a "partial fill". The law allows the pharmacy to complete that prescription within 72 hours, without requiring a new prescription. If they do not fill the rest of your prescription within those 72 hours, you will need a separate prescription to fill the remaining amount, which we will NOT provide. If the reason for the partial fill is your insurance, you will need to talk to the pharmacist about payment alternatives for the remaining tablets, but again, DO NOT ACCEPT A PARTIAL FILL, unless you can trust your pharmacist to obtain the remainder of the pills within 72 hours. ° °Prescription refills and/or changes in medication(s):  °Prescription refills, and/or changes in dose or medication, will be conducted only during scheduled medication management appointments. (Applies to both, written and electronic prescriptions.) °No refills on procedure days. No medication will be changed or started on procedure days. No changes, adjustments, and/or refills will be conducted on a procedure day. Doing so will interfere with the diagnostic portion of the procedure. °No phone refills. No medications will be "called into the pharmacy". °No Fax refills. °No weekend refills. °No Holliday refills. °No after hours refills. ° °Remember:  °Business hours are:  °Monday to Thursday 8:00 AM to 4:00 PM °Provider's Schedule: °Shakila Mak, MD - Appointments are:  °Medication management: Monday and Wednesday 8:00 AM to 4:00 PM °Procedure day: Tuesday and Thursday 7:30 AM to 4:00 PM °Bilal Lateef, MD -  Appointments are:  °Medication management: Tuesday and Thursday 8:00   AM to 4:00 PM Procedure day: Monday and Wednesday 7:30 AM to 4:00 PM (Last update: 02/09/2020) ____________________________________________________________________________________________

## 2021-09-22 LAB — TOXASSURE SELECT 13 (MW), URINE

## 2021-10-01 ENCOUNTER — Other Ambulatory Visit: Payer: Self-pay

## 2021-10-01 ENCOUNTER — Ambulatory Visit
Admission: RE | Admit: 2021-10-01 | Discharge: 2021-10-01 | Disposition: A | Discharge status: Home / Self Care | Payer: Medicare (Managed Care) | Source: Ambulatory Visit | Attending: Pain Medicine | Admitting: Pain Medicine

## 2021-10-01 DIAGNOSIS — M5481 Occipital neuralgia: Secondary | ICD-10-CM | POA: Insufficient documentation

## 2021-10-01 DIAGNOSIS — M542 Cervicalgia: Secondary | ICD-10-CM | POA: Insufficient documentation

## 2021-10-01 DIAGNOSIS — M47812 Spondylosis without myelopathy or radiculopathy, cervical region: Secondary | ICD-10-CM | POA: Diagnosis present

## 2021-10-01 DIAGNOSIS — M5412 Radiculopathy, cervical region: Secondary | ICD-10-CM | POA: Diagnosis present

## 2021-10-08 ENCOUNTER — Other Ambulatory Visit: Payer: Self-pay | Admitting: *Deleted

## 2021-10-08 DIAGNOSIS — Z87891 Personal history of nicotine dependence: Secondary | ICD-10-CM

## 2021-10-08 DIAGNOSIS — F17210 Nicotine dependence, cigarettes, uncomplicated: Secondary | ICD-10-CM

## 2021-10-15 ENCOUNTER — Telehealth: Payer: Self-pay | Admitting: Pain Medicine

## 2021-10-15 ENCOUNTER — Encounter: Payer: Self-pay | Admitting: Pain Medicine

## 2021-10-15 NOTE — Telephone Encounter (Signed)
Patient called stating the pharmacy is telling him they have no scripts there from Dr. Dossie Arbour. Please check with pharmacy and advise patient.  ?

## 2021-10-15 NOTE — Telephone Encounter (Signed)
Called [facility], there is a prescription for Hydromorphone that is available to be filled. Patient notified. ?

## 2021-10-17 ENCOUNTER — Encounter: Payer: Self-pay | Admitting: Pain Medicine

## 2021-10-17 ENCOUNTER — Ambulatory Visit: Payer: Medicare (Managed Care) | Attending: Pain Medicine | Admitting: Pain Medicine

## 2021-10-17 VITALS — BP 149/94 | HR 41 | Temp 97.4°F | Ht 75.0 in | Wt 207.0 lb

## 2021-10-17 DIAGNOSIS — M79604 Pain in right leg: Secondary | ICD-10-CM | POA: Insufficient documentation

## 2021-10-17 DIAGNOSIS — G8929 Other chronic pain: Secondary | ICD-10-CM | POA: Diagnosis present

## 2021-10-17 DIAGNOSIS — M25562 Pain in left knee: Secondary | ICD-10-CM | POA: Insufficient documentation

## 2021-10-17 DIAGNOSIS — G63 Polyneuropathy in diseases classified elsewhere: Secondary | ICD-10-CM | POA: Insufficient documentation

## 2021-10-17 DIAGNOSIS — Z79899 Other long term (current) drug therapy: Secondary | ICD-10-CM | POA: Insufficient documentation

## 2021-10-17 DIAGNOSIS — Z789 Other specified health status: Secondary | ICD-10-CM | POA: Insufficient documentation

## 2021-10-17 DIAGNOSIS — M5441 Lumbago with sciatica, right side: Secondary | ICD-10-CM | POA: Diagnosis present

## 2021-10-17 DIAGNOSIS — M5442 Lumbago with sciatica, left side: Secondary | ICD-10-CM | POA: Insufficient documentation

## 2021-10-17 DIAGNOSIS — M792 Neuralgia and neuritis, unspecified: Secondary | ICD-10-CM

## 2021-10-17 DIAGNOSIS — Z978 Presence of other specified devices: Secondary | ICD-10-CM | POA: Diagnosis present

## 2021-10-17 DIAGNOSIS — T85610S Breakdown (mechanical) of epidural and subdural infusion catheter, sequela: Secondary | ICD-10-CM | POA: Diagnosis present

## 2021-10-17 DIAGNOSIS — M25561 Pain in right knee: Secondary | ICD-10-CM | POA: Diagnosis present

## 2021-10-17 DIAGNOSIS — G894 Chronic pain syndrome: Secondary | ICD-10-CM

## 2021-10-17 DIAGNOSIS — M79605 Pain in left leg: Secondary | ICD-10-CM | POA: Diagnosis present

## 2021-10-17 DIAGNOSIS — Z79891 Long term (current) use of opiate analgesic: Secondary | ICD-10-CM

## 2021-10-17 DIAGNOSIS — M79671 Pain in right foot: Secondary | ICD-10-CM | POA: Insufficient documentation

## 2021-10-17 DIAGNOSIS — M79672 Pain in left foot: Secondary | ICD-10-CM | POA: Diagnosis present

## 2021-10-17 DIAGNOSIS — M961 Postlaminectomy syndrome, not elsewhere classified: Secondary | ICD-10-CM | POA: Insufficient documentation

## 2021-10-17 MED ORDER — PREGABALIN 75 MG PO CAPS
75.0000 mg | ORAL_CAPSULE | Freq: Three times a day (TID) | ORAL | 2 refills | Status: DC
Start: 2021-10-17 — End: 2022-01-30

## 2021-10-17 MED ORDER — HYDROMORPHONE HCL 4 MG PO TABS: 4.0000 mg | ORAL_TABLET | Freq: Three times a day (TID) | ORAL | 0 refills | Status: DC | PRN

## 2021-10-17 MED ORDER — HYDROMORPHONE HCL 4 MG PO TABS
4.0000 mg | ORAL_TABLET | Freq: Three times a day (TID) | ORAL | 0 refills | Status: DC | PRN
Start: 2021-10-29 — End: 2021-11-26

## 2021-10-17 NOTE — Progress Notes (Addendum)
PROVIDER NOTE: Information contained herein reflects review and annotations entered in association with encounter. Interpretation of such information and data should be left to medically-trained personnel. Information provided to patient can be located elsewhere in the medical record under "Patient Instructions". Document created using STT-dictation technology, any transcriptional errors that may result from process are unintentional.  ?  ?Patient: [name]  Service Category: E/M  Provider: [name], MD  ?DOB: 17-Dec-1945  DOS: 10/17/2021  Specialty: Interventional Pain Management  ?MRN: [id number]  Setting: Ambulatory outpatient  PCP: Adin Hector, MD  ?Type: Established Patient    Referring Provider: Adin Hector, MD  ?Location: Office  Delivery: Face-to-face    ? ?HPI  ?[name], a 76 y.o. year old male, is here today because of his Chronic pain syndrome [G89.4]. Mr. Hanners primary complain today is Back Pain (lower) ?Last encounter: My last encounter with him was on 10/15/2021. ?Pertinent problems: Mr. 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[place]); Chronic ankle pain (Bilateral); Pain in joint, ankle and foot; Polyneuropathy associated with underlying disease ([place]); Presence of intrathecal pump; Lumbosacral radiculopathy at S1 (Bilateral) (R>L); Chronic feet pain (3ry area of Pain) (Bilateral) (R>L); Chronic knee pain (4th area of Pain) (Bilateral) (R>L); Failed back surgical syndrome; Lower extremity weakness (Bilateral); Chronic neuropathic pain; Peripheral neurogenic pain; Nonfamilial nocturnal leg cramps (Bilateral); Restless leg syndrome; Malfunction of intrathecal infusion pump; End of battery life of intrathecal  infusion pump; Pain in right knee; DDD (degenerative disc disease), lumbosacral; Abnormal MRI, lumbar spine (11/01/2019); 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 ?Pain Assessment: Severity of Chronic pain is reported as a 8 /10. Location: Back Right, Left/pain radiaities down both leg to his feet, neck. Onset: More than a month ago. Quality: Aching, Sharp, Throbbing, Stabbing. Timing: Constant. Modifying factor(s): nothing. ?Vitals:  height is '6\' 3"'$  (1.905 m) and weight is 207 lb (93.9 kg). His temperature is 97.4 ?F (36.3 ?C) (abnormal). His blood pressure is 149/94 (abnormal) and his pulse is 41 (abnormal). His oxygen saturation is 97%.  ? ?Reason for encounter: medication management.   The patient indicates doing well with the current medication regimen. No adverse reactions or side effects reported to the medications.  The patient refers having forgotten to have the x-rays done.  I have reminded them that they will expire in 2 days.  He was instructed to go down to the hospital and have that today.  Today I took the time to go over his cervical MRI and explained to him the results in layman's terms. ? ?The patient asked me about the Lyrica which I think that he should continue taking.  I have not been writing for that pregabalin 75 mg capsule, but I will provide him with a 21-monthprescription today until he can get together with Dr. BRamonita Labso that he can take over the  medication.  I think that is a  dose in the medication is appropriate, but I simply have had to transfer all nonnarcotics to the patient's primary care physicians due to the shortage in manpower that we are experiencing at our facility.  We will continue to manage his opioid analgesics but none of the other nonnarcotics.  Have had to transfer those in all of my patients, not just him. ? ?Because the patient's intrathecal pump and catheter are not currently working, the patient had decided to have it removed.  I had previously put in a referral to Dr. Lacinda Axon to have it removed, but he has left the [facility] practice.  For this reason, I will put the patient on my schedule for the removal.  Previously we had done a catheter study on the pump that proved to be nonfunctional.  The patient is currently controlling his pain with oral medications and seems to prefer staying that way. ? ?RTCB: 01/27/2022 ? ?Pharmacotherapy Assessment  ?Analgesic: Hydromorphone 4 mg tablet, 1 tab p.o. 3 times daily (last filled on 04/02/2021) ?MME/day: 0 mg/day   ? ?Monitoring: ? PMP: PDMP reviewed during this encounter.       ?Pharmacotherapy: No side-effects or adverse reactions reported. ?Compliance: No problems identified. ?Effectiveness: Clinically acceptable. ? ?Chauncey Fischer, RN  10/17/2021  3:05 PM  Signed ?Nursing Pain Medication Assessment:  ?Safety precautions to be maintained throughout the outpatient stay will include: orient to surroundings, keep bed in low position, maintain call bell within reach at all times, provide assistance with transfer out of bed and ambulation.  ?Medication Inspection Compliance: Pill count conducted under aseptic conditions, in front of the patient. Neither the pills nor the bottle was removed from the patient's sight at any time. Once count was completed pills were immediately returned to the patient in their original bottle. ? ?Medication: Hydromorphone (Dilaudid) ?Pill/Patch Count:  84 of  90 pills remain ?Pill/Patch Appearance: Markings consistent with prescribed medication ?Bottle Appearance: Standard pharmacy container. Clearly labeled. ?Filled Date: 3 / 54 / 2023 ?Last Medication intake:  TodaySafety precautions to be maintained throughout the outpatient stay will include: orient to surroundings, keep bed in low position, maintain call bell within reach at all times, provide assistance with transfer out of bed and ambulation.  ?   UDS:  ?Summary  ?Date Value Ref Range Status  ?09/19/2021 Note  Final  ?  Comment:  ?  ==================================================================== ?[employer] Select 13 (MW) ?==================================================================== ?Test                             Result       Flag       Units ? ?Drug Present and Declared for Prescription Verification ?  Hydromorphone                  1862         EXPECTED   ng/mg creat ?   Hydromorphone may be administered as a scheduled prescription ?   medication; it is also an expected metabolite of hydrocodone. ? ?==================================================================== ?Test                      Result    Flag   Units      Ref Range ?  Creatinine              26               mg/dL      >=  20 ?==================================================================== ?Declared Medications: ? The flagging and interpretation on this report are based on the ? following declared medications.  Unexpected results may arise from ? inaccuracies in the declared medications. ? ? **Note: The testing scope of this panel includes these medications: ? ? Hydromorphone (Dilaudid) ? ? **Note: The testing scope of this panel does not include the ? following reported medications: ? ? Albuterol (Proair HFA) ? Amitriptyline (Elavil) ? Azelastine ? Bisacodyl ? Clonidine (Catapres) ? Duloxetine (Cymbalta) ? Eye Drop ? Levothyroxine (Synthroid) ? Losartan (Cozaar) ? Multivitamin ? Naloxone (Narcan) ? Olodaterol ? Pregabalin  (Lyrica) ? Promethazine (Phenergan) ? Simvastatin (Zocor) ? Tiotropium ?==================================================================== ?For clinical consultation, please call [phone]. ?===============

## 2021-10-17 NOTE — Progress Notes (Signed)
Nursing Pain Medication Assessment:  ?Safety precautions to be maintained throughout the outpatient stay will include: orient to surroundings, keep bed in low position, maintain call bell within reach at all times, provide assistance with transfer out of bed and ambulation.  ?Medication Inspection Compliance: Pill count conducted under aseptic conditions, in front of the patient. Neither the pills nor the bottle was removed from the patient's sight at any time. Once count was completed pills were immediately returned to the patient in their original bottle. ? ?Medication: Hydromorphone (Dilaudid) ?Pill/Patch Count:  84 of 90 pills remain ?Pill/Patch Appearance: Markings consistent with prescribed medication ?Bottle Appearance: Standard pharmacy container. Clearly labeled. ?Filled Date: 3 / 28 / 2023 ?Last Medication intake:  TodaySafety precautions to be maintained throughout the outpatient stay will include: orient to surroundings, keep bed in low position, maintain call bell within reach at all times, provide assistance with transfer out of bed and ambulation.  ?

## 2021-10-17 NOTE — Patient Instructions (Signed)
____________________________________________________________________________________________ ? ?Medication Rules ? ?Purpose: To inform patients, and their family members, of our rules and regulations. ? ?Applies to: All patients receiving prescriptions (written or electronic). ? ?Pharmacy of record: Pharmacy where electronic prescriptions will be sent. If written prescriptions are taken to a different pharmacy, please inform the nursing staff. The pharmacy listed in the electronic medical record should be the one where you would like electronic prescriptions to be sent. ? ?Electronic prescriptions: In compliance with the [employer]reng[employer] Opioid Misuse Prevention (STOP) Act of 2017 (Session Law 2017-74/H243), effective July 22, 2018, all controlled substances must be electronically prescribed. Calling prescriptions to the pharmacy will cease to exist. ? ?Prescription refills: Only during scheduled appointments. Applies to all prescriptions. ? ?NOTE: The following applies primarily to controlled substances (Opioid* Pain Medications).  ? ?Type of encounter (visit): For patients receiving controlled substances, face-to-face visits are required. (Not an option or up to the patient.) ? ?Patient's responsibilities: ?Pain Pills: Bring all pain pills to every appointment (except for procedure appointments). ?Pill Bottles: Bring pills in original pharmacy bottle. Always bring the newest bottle. Bring bottle, even if empty. ?Medication refills: You are responsible for knowing and keeping track of what medications you take and those you need refilled. ?The day before your appointment: write a list of all prescriptions that need to be refilled. ?The day of the appointment: give the list to the admitting nurse. Prescriptions will be written only during appointments. No prescriptions will be written on procedure days. ?If you forget a medication: it will not be "Called in", "Faxed", or "electronically sent". You will  need to get another appointment to get these prescribed. ?No early refills. Do not call asking to have your prescription filled early. ?Prescription Accuracy: You are responsible for carefully inspecting your prescriptions before leaving our office. Have the discharge nurse carefully go over each prescription with you, before taking them home. Make sure that your name is accurately spelled, that your address is correct. Check the name and dose of your medication to make sure it is accurate. Check the number of pills, and the written instructions to make sure they are clear and accurate. Make sure that you are given enough medication to last until your next medication refill appointment. ?Taking Medication: Take medication as prescribed. When it comes to controlled substances, taking less pills or less frequently than prescribed is permitted and encouraged. ?Never take more pills than instructed. ?Never take medication more frequently than prescribed.  ?Inform other Doctors: Always inform, all of your healthcare providers, of all the medications you take. ?Pain Medication from other Providers: You are not allowed to accept any additional pain medication from any other Doctor or Healthcare provider. There are two exceptions to this rule. (see below) In the event that you require additional pain medication, you are responsible for notifying us, as stated below. ?Cough Medicine: Often these contain an opioid, such as codeine or hydrocodone. Never accept or take cough medicine containing these opioids if you are already taking an opioid* medication. The combination may cause respiratory failure and death. ?Medication Agreement: You are responsible for carefully reading and following our Medication Agreement. This must be signed before receiving any prescriptions from our practice. Safely store a copy of your signed Agreement. Violations to the Agreement will result in no further prescriptions. (Additional copies of our  Medication Agreement are available upon request.) ?Laws, Rules, & Regulations: All patients are expected to follow all Federal and State Laws, Statutes, Rules, & Regulations. Ignorance of   the Laws does not constitute a valid excuse.  ?Illegal drugs and Controlled Substances: The use of illegal substances (including, but not limited to marijuana and its derivatives) and/or the illegal use of any controlled substances is strictly prohibited. Violation of this rule may result in the immediate and permanent discontinuation of any and all prescriptions being written by our practice. The use of any illegal substances is prohibited. ?Adopted CDC guidelines & recommendations: Target dosing levels will be at or below 60 MME/day. Use of benzodiazepines** is not recommended. ? ?Exceptions: There are only two exceptions to the rule of not receiving pain medications from other Healthcare Providers. ?Exception #1 (Emergencies): In the event of an emergency (i.e.: accident requiring emergency care), you are allowed to receive additional pain medication. However, you are responsible for: As soon as you are able, call our office [phone], at any time of the day or night, and leave a message stating your name, the date and nature of the emergency, and the name and dose of the medication prescribed. In the event that your call is answered by a member of our staff, make sure to document and save the date, time, and the name of the person that took your information.  ?Exception #2 (Planned Surgery): In the event that you are scheduled by another doctor or dentist to have any type of surgery or procedure, you are allowed (for a period no longer than 30 days), to receive additional pain medication, for the acute post-op pain. However, in this case, you are responsible for picking up a copy of our "Post-op Pain Management for Surgeons" handout, and giving it to your surgeon or dentist. This document is available at our office, and  does not require an appointment to obtain it. Simply go to our office during business hours (Monday-Thursday from 8:00 AM to 4:00 PM) (Friday 8:00 AM to 12:00 Noon) or if you have a scheduled appointment with us, prior to your surgery, and ask for it by name. In addition, you are responsible for: calling our office [phone], at any time of the day or night, and leaving a message stating your name, name of your surgeon, type of surgery, and date of procedure or surgery. Failure to comply with your responsibilities may result in termination of therapy involving the controlled substances. ?Medication Agreement Violation. Following the above rules, including your responsibilities will help you in avoiding a Medication Agreement Violation (?Breaking your Pain Medication Contract?). ? ?*Opioid medications include: morphine, codeine, oxycodone, oxymorphone, hydrocodone, hydromorphone, meperidine, tramadol, tapentadol, buprenorphine, fentanyl, methadone. ?**Benzodiazepine medications include: diazepam (Valium), alprazolam (Xanax), clonazepam (Klonopine), lorazepam (Ativan), clorazepate (Tranxene), chlordiazepoxide (Librium), estazolam (Prosom), oxazepam (Serax), temazepam (Restoril), triazolam (Halcion) ?(Last updated: 04/18/2021) ?____________________________________________________________________________________________ ? ____________________________________________________________________________________________ ? ?Medication Recommendations and Reminders ? ?Applies to: All patients receiving prescriptions (written and/or electronic). ? ?Medication Rules & Regulations: These rules and regulations exist for your safety and that of others. They are not flexible and neither are we. Dismissing or ignoring them will be considered "non-compliance" with medication therapy, resulting in complete and irreversible termination of such therapy. (See document titled "Medication Rules" for more details.) In all conscience,  because of safety reasons, we cannot continue providing a therapy where the patient does not follow instructions. ? ?Pharmacy of record:  ?Definition: This is the pharmacy where your electronic prescriptions w

## 2021-10-18 DIAGNOSIS — T85610A Breakdown (mechanical) of epidural and subdural infusion catheter, initial encounter: Secondary | ICD-10-CM | POA: Insufficient documentation

## 2021-10-18 NOTE — Addendum Note (Signed)
Addended by: Milinda Pointer A on: 10/18/2021 08:10 AM ? ? Modules accepted: Orders ? ?

## 2021-10-19 ENCOUNTER — Ambulatory Visit
Admission: RE | Admit: 2021-10-19 | Discharge: 2021-10-19 | Disposition: A | Discharge status: Home / Self Care | Payer: Medicare (Managed Care) | Attending: Pain Medicine | Admitting: Pain Medicine

## 2021-10-19 ENCOUNTER — Ambulatory Visit
Admission: RE | Admit: 2021-10-19 | Discharge: 2021-10-19 | Disposition: A | Discharge status: Home / Self Care | Payer: Medicare (Managed Care) | Source: Ambulatory Visit | Attending: Pain Medicine | Admitting: Pain Medicine

## 2021-10-19 DIAGNOSIS — G8929 Other chronic pain: Secondary | ICD-10-CM | POA: Insufficient documentation

## 2021-10-19 DIAGNOSIS — M5412 Radiculopathy, cervical region: Secondary | ICD-10-CM | POA: Insufficient documentation

## 2021-10-19 DIAGNOSIS — M25511 Pain in right shoulder: Secondary | ICD-10-CM | POA: Insufficient documentation

## 2021-10-19 DIAGNOSIS — M5481 Occipital neuralgia: Secondary | ICD-10-CM | POA: Insufficient documentation

## 2021-10-19 DIAGNOSIS — M25512 Pain in left shoulder: Secondary | ICD-10-CM | POA: Insufficient documentation

## 2021-10-19 DIAGNOSIS — M47812 Spondylosis without myelopathy or radiculopathy, cervical region: Secondary | ICD-10-CM | POA: Insufficient documentation

## 2021-10-19 DIAGNOSIS — M542 Cervicalgia: Secondary | ICD-10-CM

## 2021-10-23 ENCOUNTER — Ambulatory Visit (INDEPENDENT_AMBULATORY_CARE_PROVIDER_SITE_OTHER): Payer: Medicare (Managed Care) | Admitting: Acute Care

## 2021-10-23 ENCOUNTER — Encounter: Payer: Self-pay | Admitting: Acute Care

## 2021-10-23 DIAGNOSIS — F17210 Nicotine dependence, cigarettes, uncomplicated: Secondary | ICD-10-CM

## 2021-10-23 NOTE — Progress Notes (Signed)
Virtual Visit via Telephone Note ? ?I connected with [name] on 06/05/21 at  2:00 PM EST by telephone and verified that I am speaking with the correct person using two identifiers. ? ?Location: ?Patient: Home ?Provider: Working from home ?  ?I discussed the limitations, risks, security and privacy concerns of performing an evaluation and management service by telephone and the availability of in person appointments. I also discussed with the patient that there may be a patient responsible charge related to this service. The patient expressed understanding and agreed to proceed. ? ?Shared Decision Making Visit Lung Cancer Screening Program ?([phone]) ? ? ?Eligibility: ?Age 76 y.o. ?Pack Years Smoking History Calculation 44 ?(# packs/per year x # years smoked) ?Recent History of coughing up blood  no ?Unexplained weight loss? no ?( >Than 15 pounds within the last 6 months ) ?Prior History Lung / other cancer no ?(Diagnosis within the last 5 years already requiring surveillance chest CT Scans). ?Smoking Status Current Smoker ?Former Smokers: Years since quit: NA ? Quit Date: NA ? ?Visit Components: ?Discussion included one or more decision making aids. yes ?Discussion included risk/benefits of screening. yes ?Discussion included potential follow up diagnostic testing for abnormal scans. yes ?Discussion included meaning and risk of over diagnosis. yes ?Discussion included meaning and risk of False Positives. yes ?Discussion included meaning of total radiation exposure. yes ? ?Counseling Included: ?Importance of adherence to annual lung cancer LDCT screening. yes ?Impact of comorbidities on ability to participate in the program. yes ?Ability and willingness to under diagnostic treatment. yes ? ?Smoking Cessation Counseling: ?Current Smokers:  ?Discussed importance of smoking cessation. yes ?Information about tobacco cessation classes and interventions provided to patient. yes ?Patient provided with "ticket" for LDCT  Scan. yes ?Symptomatic Patient. yes ? Counseling(Intermediate counseling: > three minutes) [id number] ?Diagnosis Code: Tobacco Use Z72.0 ?Asymptomatic Patient no ? Counseling NA ?Former Smokers:  ?Discussed the importance of maintaining cigarette abstinence. yes ?Diagnosis Code: Personal History of Nicotine Dependence. S93.734 ?Information about tobacco cessation classes and interventions provided to patient. Yes ?Patient provided with "ticket" for LDCT Scan. yes ?Written Order for Lung Cancer Screening with LDCT placed in Epic. Yes ?(CT Chest Lung Cancer Screening Low Dose W/O CM) [id number] ?Z12.2-Screening of respiratory organs ?Z87.891-Personal history of nicotine dependence ? ? ?I spent 25 minutes of face to face time with him discussing the risks and benefits of lung cancer screening. We viewed a power point together that explained in detail the above noted topics. We took the time to pause the power point at intervals to allow for questions to be asked and answered to ensure understanding. We discussed that he had taken the single most powerful action possible to decrease his risk of developing lung cancer when he quit smoking. I counseled him to remain smoke free, and to contact me if he ever had the desire to smoke again so that I can provide resources and tools to help support the effort to remain smoke free. We discussed the time and location of the scan, and that either  Doroteo Glassman RN or I will call with the results within  24-48 hours of receiving them. He has my card and contact information in the event he needs to speak with me, in addition to a copy of the power point we reviewed as a resource. He verbalized understanding of all of the above and had no further questions upon leaving the office.  ? ? ? ?I explained to the patient that there has been a  high incidence of coronary artery disease noted on these exams. I explained that this is a non-gated exam therefore degree or severity cannot be determined.  This patient is on statin therapy. I have asked the patient to follow-up with their PCP regarding any incidental finding of coronary artery disease and management with diet or medication as they feel is clinically indicated. The patient verbalized understanding of the above and had no further questions. ? ? ?I spent 3 minutes counseling on smoking cessation and the health risks of continued tobacco abuse  ? ? ?Yashika Mask D. Harris, NP-C ?[facility] ?Personal contact information can be found on Amion  ?10/23/2021, 10:39 AM ? ? ? ? ? ? ? ? ? ?

## 2021-10-23 NOTE — Patient Instructions (Signed)
Thank you for participating in the [facility]. It was our pleasure to meet you today. We will call you with the results of your scan within the next few days. Your scan will be assigned a Lung RADS category score by the physicians reading the scans.  This Lung RADS score determines follow up scanning.  See below for description of categories, and follow up screening recommendations. We will be in touch to schedule your follow up screening annually or based on recommendations of our providers. We will fax a copy of your scan results to your Primary Care Physician, or the physician who referred you to the program, to ensure they have the results. Please call the office if you have any questions or concerns regarding your scanning experience or results.  Our office number is [phone]. Please speak with Denise Phelps, RN. , or  Denise Buckner RN, They are  our Lung Cancer Screening RN.'s If They are unavailable when you call, Please leave a message on the voice mail. We will return your call at our earliest convenience.This voice mail is monitored several times a day.  Remember, if your scan is normal, we will scan you annually as long as you continue to meet the criteria for the program. (Age 55-77, Current smoker or smoker who has quit within the last 15 years). If you are a smoker, remember, quitting is the single most powerful action that you can take to decrease your risk of lung cancer and other pulmonary, breathing related problems. We know quitting is hard, and we are here to help.  Please let us know if there is anything we can do to help you meet your goal of quitting. If you are a former smoker, congratulations. We are proud of you! Remain smoke free! Remember you can refer friends or family members through the number above.  We will screen them to make sure they meet criteria for the program. Thank you for helping us take better care of you by  participating in Lung Screening.  You can receive free nicotine replacement therapy ( patches, gum or mints) by calling 1-800-QUIT NOW. Please call so we can get you on the path to becoming  a non-smoker. I know it is hard, but you can do this!  Lung RADS Categories:  Lung RADS 1: no nodules or definitely non-concerning nodules.  Recommendation is for a repeat annual scan in 12 months.  Lung RADS 2:  nodules that are non-concerning in appearance and behavior with a very low likelihood of becoming an active cancer. Recommendation is for a repeat annual scan in 12 months.  Lung RADS 3: nodules that are probably non-concerning , includes nodules with a low likelihood of becoming an active cancer.  Recommendation is for a 6-month repeat screening scan. Often noted after an upper respiratory illness. We will be in touch to make sure you have no questions, and to schedule your 6-month scan.  Lung RADS 4 A: nodules with concerning findings, recommendation is most often for a follow up scan in 3 months or additional testing based on our provider's assessment of the scan. We will be in touch to make sure you have no questions and to schedule the recommended 3 month follow up scan.  Lung RADS 4 B:  indicates findings that are concerning. We will be in touch with you to schedule additional diagnostic testing based on our provider's  assessment of the scan.  Other options for assistance in smoking cessation (   As covered by your insurance benefits)  Hypnosis for smoking cessation  [employer] [phone]  Acupuncture for smoking cessation  [facility] [phone]   

## 2021-10-24 ENCOUNTER — Other Ambulatory Visit: Payer: Self-pay

## 2021-10-24 ENCOUNTER — Ambulatory Visit
Admission: RE | Admit: 2021-10-24 | Discharge: 2021-10-24 | Disposition: A | Discharge status: Home / Self Care | Payer: Medicare (Managed Care) | Source: Ambulatory Visit | Attending: Acute Care | Admitting: Acute Care

## 2021-10-24 DIAGNOSIS — F17210 Nicotine dependence, cigarettes, uncomplicated: Secondary | ICD-10-CM | POA: Insufficient documentation

## 2021-10-24 DIAGNOSIS — Z87891 Personal history of nicotine dependence: Secondary | ICD-10-CM | POA: Diagnosis present

## 2021-10-26 ENCOUNTER — Emergency Department: Payer: Medicare (Managed Care)

## 2021-10-26 ENCOUNTER — Other Ambulatory Visit: Payer: Self-pay

## 2021-10-26 ENCOUNTER — Emergency Department
Admission: EM | Admit: 2021-10-26 | Discharge: 2021-10-26 | Disposition: A | Discharge status: Home / Self Care | Payer: Medicare (Managed Care) | Source: Home / Self Care | Attending: Emergency Medicine | Admitting: Emergency Medicine

## 2021-10-26 ENCOUNTER — Other Ambulatory Visit: Payer: Self-pay | Admitting: Acute Care

## 2021-10-26 DIAGNOSIS — J449 Chronic obstructive pulmonary disease, unspecified: Secondary | ICD-10-CM | POA: Insufficient documentation

## 2021-10-26 DIAGNOSIS — I110 Hypertensive heart disease with heart failure: Secondary | ICD-10-CM | POA: Insufficient documentation

## 2021-10-26 DIAGNOSIS — W01198A Fall on same level from slipping, tripping and stumbling with subsequent striking against other object, initial encounter: Secondary | ICD-10-CM | POA: Insufficient documentation

## 2021-10-26 DIAGNOSIS — Y92002 Bathroom of unspecified non-institutional (private) residence single-family (private) house as the place of occurrence of the external cause: Secondary | ICD-10-CM | POA: Insufficient documentation

## 2021-10-26 DIAGNOSIS — R0789 Other chest pain: Secondary | ICD-10-CM | POA: Diagnosis not present

## 2021-10-26 DIAGNOSIS — F17210 Nicotine dependence, cigarettes, uncomplicated: Secondary | ICD-10-CM

## 2021-10-26 DIAGNOSIS — E1140 Type 2 diabetes mellitus with diabetic neuropathy, unspecified: Secondary | ICD-10-CM | POA: Insufficient documentation

## 2021-10-26 DIAGNOSIS — Z87891 Personal history of nicotine dependence: Secondary | ICD-10-CM

## 2021-10-26 DIAGNOSIS — J45909 Unspecified asthma, uncomplicated: Secondary | ICD-10-CM | POA: Insufficient documentation

## 2021-10-26 DIAGNOSIS — S2232XA Fracture of one rib, left side, initial encounter for closed fracture: Secondary | ICD-10-CM | POA: Insufficient documentation

## 2021-10-26 DIAGNOSIS — Z122 Encounter for screening for malignant neoplasm of respiratory organs: Secondary | ICD-10-CM

## 2021-10-26 DIAGNOSIS — I2510 Atherosclerotic heart disease of native coronary artery without angina pectoris: Secondary | ICD-10-CM | POA: Insufficient documentation

## 2021-10-26 DIAGNOSIS — Z85828 Personal history of other malignant neoplasm of skin: Secondary | ICD-10-CM | POA: Insufficient documentation

## 2021-10-26 DIAGNOSIS — I509 Heart failure, unspecified: Secondary | ICD-10-CM | POA: Insufficient documentation

## 2021-10-26 DIAGNOSIS — A419 Sepsis, unspecified organism: Secondary | ICD-10-CM | POA: Diagnosis not present

## 2021-10-26 MED ORDER — METHOCARBAMOL 500 MG PO TABS
500.0000 mg | ORAL_TABLET | Freq: Once | ORAL | Status: AC
Start: 2021-10-26 — End: 2021-10-26
  Administered 2021-10-26: 500 mg via ORAL
  Filled 2021-10-26: qty 1

## 2021-10-26 MED ORDER — HYDROMORPHONE HCL 2 MG PO TABS
4.0000 mg | ORAL_TABLET | Freq: Once | ORAL | Status: AC
  Administered 2021-10-26: 4 mg via ORAL
  Filled 2021-10-26: qty 2

## 2021-10-26 MED ORDER — METHOCARBAMOL 500 MG PO TABS: 500.0000 mg | ORAL_TABLET | Freq: Two times a day (BID) | ORAL | 0 refills | Status: DC | PRN

## 2021-10-26 NOTE — ED Triage Notes (Signed)
Pt states he tripped and fell getting up to the BR around 4am this morning injuring his left ribs, pt states it hurts to move or takes deep breaths ?

## 2021-10-26 NOTE — ED Provider Notes (Signed)
[facility] ?Emergency Department Provider Note ?____________________________________________ ? ?Time seen: Approximately 8:37 AM ? ?I have reviewed the triage vital signs and the nursing notes. ? ? ?HISTORY ? ?Chief Complaint ?Fall ? ? ? ?HPI ?[name] is a 76 y.o. male with history as listed below who presents to the emergency department for evaluation and treatment of left side rib pain after fall this morning. He was getting up to use the bathroom and lost his balance. He turned to keep from hitting his head and his left lateral rib area hit the footboard of the bed and then he hit the floor. No loss of consciousness.  ? ?Past Medical History:  ?Diagnosis Date  ? Anxiety   ? Arthritis   ? INFLAMMATORY POLYARTHROPATHY  ? Asthma   ? Cancer [facility])   ? CHF (congestive heart failure) ([place])   ? COPD (chronic obstructive pulmonary disease) ([place])   ? Coronary artery disease   ? 2000  ? Depression   ? Diabetes mellitus without complication ([place])   ? DVT of lower extremity, bilateral ([place])   ? Dysrhythmia   ? afib  ? Hypertension   ? Neuropathy   ? Pain   ? chronic syndrome  ? Peripheral vascular disease ([place])   ? S/P IVC filter   ? removed  ? Sleep apnea   ? o2  2.5 l hs  ? Sleep disorder   ? Stasis dermatitis of both legs   ? ? ?Patient Active Problem List  ? Diagnosis Date Noted  ? Mechanical breakdown of spinal infusion catheter 10/18/2021  ? Other specified health status 10/17/2021  ? Chronic neurogenic pain 10/17/2021  ? Cardiac dysrhythmia 09/19/2021  ? Cervicalgia 09/19/2021  ? Chronic shoulder pain (Bilateral) (R>L) 09/19/2021  ? Radicular pain of shoulder 09/19/2021  ? Occipital neuralgia (Right) 09/19/2021  ? Cervico-occipital neuralgia (Right) 09/19/2021  ? Cervical facet hypertrophy (Multilevel) (Bilateral) 09/19/2021  ? Cervical facet pain 09/19/2021  ? Painful cervical range of motion 09/19/2021  ? Pain in right knee 04/24/2021  ? DDD (degenerative disc disease), lumbosacral  04/24/2021  ? Abnormal MRI, lumbar spine (11/01/2019) 04/24/2021  ? 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 Lumbar facet arthropathy (Multilevel) (Bilateral) 04/24/2021  ? Lumbosacral facet syndrome (Bilateral) 04/24/2021  ? Lumbar foraminal narrowing (Multilevel) (Bilateral) 04/24/2021  ? Retinal tear, right 02/22/2021  ? Vitreous hemorrhage of right eye ([place]) 02/22/2021  ? Chronic coronary artery disease 02/07/2021  ? Depressive disorder 02/07/2021  ? Solitary pulmonary nodule 02/07/2021  ? Adenomatous colon polyp 02/07/2021  ? Blepharitis 02/07/2021  ? Blood in urine 02/07/2021  ? Calculus of kidney 02/07/2021  ? Chronic prostatitis 02/07/2021  ? Encounter for screening for malignant neoplasm of respiratory organs 02/07/2021  ? Flatulence, eructation and gas pain 02/07/2021  ? Hemangioma 02/07/2021  ? Hyperlipidemia 02/07/2021  ? Irritable bowel syndrome 02/07/2021  ? Chronic low back pain (1ry area of Pain) (Bilateral) (R>L) w/ sciatica (Bilateral) 02/07/2021  ? Chronic ankle pain (Bilateral) 02/07/2021  ? Pain in joint, ankle and foot 02/07/2021  ? Sebaceous cyst 02/07/2021  ? Tobacco dependence syndrome 02/07/2021  ? Pharmacologic therapy 02/07/2021  ? Disorder of skeletal system 02/07/2021  ? Problems influencing health status 02/07/2021  ? Lumbosacral radiculopathy at S1 (Bilateral) (R>L) 02/07/2021  ? Chronic feet pain (3ry area of Pain) (Bilateral) (R>L) 02/07/2021  ? Chronic knee pain (4th area of Pain) (Bilateral) (R>L) 02/07/2021  ? 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  Lower extremity weakness (Bilateral) 02/07/2021  ? Chronic neuropathic pain 02/07/2021  ? 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  ? Nonfamilial nocturnal leg cramps (Bilateral) 02/07/2021  ? Restless leg syndrome 02/07/2021  ? Malfunction of intrathecal infusion pump 02/07/2021  ? End of battery life of intrathecal infusion pump 02/07/2021  ? Chronic systolic CHF (congestive heart failure)  ([place]) 08/05/2019  ? Acquired hypothyroidism 10/04/2018  ? B12 deficiency 08/26/2018  ? Chronic obstructive pulmonary disease ([place]) 08/26/2018  ? Polyneuropathy associated with underlying disease ([place]) 08/26/2018  ? Chronic venous insufficiency 03/24/2018  ? GERD (gastroesophageal reflux disease) 11/30/2017  ? Senile purpura ([place]) 06/24/2017  ? Stasis dermatitis of both legs 01/24/2017  ? Chronic lower extremity pain (2ry area of Pain) (Bilateral) (R>L) 01/24/2017  ? Hx of deep venous thrombosis 11/22/2015  ? Paroxysmal atrial fibrillation ([place]) 11/22/2015  ? 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 Type 2 diabetes mellitus with complication, without long-term current use of insulin ([place]) 07/05/2015  ? Encounter for monitoring opioid maintenance therapy 06/29/2015  ? Presence of intrathecal pump 04/15/2014  ? Hypertension 08/03/2012  ? Chronic, continuous use of opioids 04/21/2012  ? Smoking 03/30/2012  ? Anxiety 02/18/2012  ? Chronic pain syndrome 02/18/2012  ? Lumbar post-laminectomy syndrome 02/18/2012  ? 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 Type 2 diabetes mellitus ([place]) 07/23/1999  ?____________________________________________ ? ? ?PHYSICAL EXAM: ? ?VITAL SIGNS: ?ED Triage Vitals  ?Enc Vitals Group  ?   BP 10/26/21 0829 (!) 156/87  ?   Pulse Rate 10/26/21 0829 84  ?   Resp 10/26/21 0829 16  ?   Temp 10/26/21 0829 98.4 ?F (36.9 ?C)  ?   Temp Source 10/26/21 0829 Oral  ?   SpO2 10/26/21 0829 96 %  ?   Weight 10/26/21 0825 207 lb (93.9 kg)  ?   Height 10/26/21 0825 '6\' 3"'$  (1.905 m)  ?   Head Circumference --   ?   Peak Flow --   ?   Pain Score 10/26/21 0825 10  ?   Pain Loc --   ?   Pain Edu? --   ?   Excl. in [place]? --   ? ? ?Constitutional: Alert and oriented. Uncomfortable appearing and in no acute distress. ?Eyes: Conjunctivae are clear without discharge or drainage ?Head: Atraumatic ?Neck: Supple. No focal midline tenderness ?Respiratory: No  cough. Respirations are even and unlabored. ?Musculoskeletal: Left lateral ribs with overlying erythema. ?Neurologic: Awake, alert, oriented.  ?Skin: Superficial abrasion and erythema to left lateral ribs  ?Psychiatric: Affect and behavior are appropriate. ? ?____________________________________________ ?  ?LABS ?(all labs ordered are listed, but only abnormal results are displayed) ? ?Labs Reviewed - No data to display ?____________________________________________ ? ?RADIOLOGY ? ?Left lateral 6th rib fracture. ? ?I, Sherrie George, personally viewed and evaluated these images (plain radiographs) as part of my medical decision making, as well as reviewing the written report by the radiologist. ? ?DG Ribs Unilateral W/Chest Left ? ?Result Date: 10/26/2021 ?CLINICAL DATA:  Tripped and fell this morning going to the bathroom, injury to LEFT ribs, hurts to move ir take a deep breath EXAM: LEFT RIBS AND CHEST - 3+ VIEW COMPARISON:  Chest radiograph 02/02/2009 FINDINGS: Normal heart size, mediastinal contours, and pulmonary vascularity. Atherosclerotic calcification aorta. Elevation of RIGHT diaphragm with bowel interposition. Mild bronchitic and interstitial changes progressive since 2010. No acute infiltrate, pleural effusion, or pneumothorax. Surgical clips LEFT axilla. BB placed at site  of symptoms lower LEFT ribs. Displaced fracture of the lateral LEFT sixth rib. No definite additional fractures. IMPRESSION: Fracture of lateral LEFT sixth rib. Aortic Atherosclerosis (ICD10-I70.0). Electronically Signed   By: Lavonia Dana M.D.   On: 10/26/2021 09:03   ?____________________________________________ ? ? ?PROCEDURES ? ?Procedures ? ?____________________________________________ ? ? ?INITIAL IMPRESSION / ASSESSMENT AND PLAN / ED COURSE ? ?[name] is a 76 y.o. who presents to the emergency department for evaluation after he lost his balance this morning when getting up to go to the bathroom and hit his chest wall on the  footboard.  No loss of consciousness.  He is not currently on any blood thinners.  He has no headache or neck pain. ? ?X-ray shows a displaced left lateral sixth rib fracture which is consistent with his exam.

## 2021-10-26 NOTE — ED Notes (Signed)
See triage note  presents s/p fall  states lost his balance  fell  hitting left lateral rib area  red area noted   very tender touch   ?

## 2021-10-26 NOTE — Discharge Instructions (Signed)
Do not take additional pain medication without approval from your pain management specialist. ?Do not take the muscle relaxer within 4 hours of the hydromorphone. ?Use a pillow as a splint. ?Take deep breaths often through the day to prevent pneumonia.  ?Return to the ER for shortness of breath, cough, or fever. ?Follow up with primary care for any other concern. ?

## 2021-10-28 ENCOUNTER — Emergency Department: Payer: Medicare (Managed Care)

## 2021-10-28 ENCOUNTER — Inpatient Hospital Stay
Admission: EM | Admit: 2021-10-28 | Discharge: 2021-11-01 | DRG: 871 | Disposition: A | Discharge status: Home / Self Care | Payer: Medicare (Managed Care) | Attending: Family Medicine | Admitting: Family Medicine

## 2021-10-28 ENCOUNTER — Inpatient Hospital Stay: Payer: Medicare (Managed Care)

## 2021-10-28 ENCOUNTER — Other Ambulatory Visit: Payer: Self-pay

## 2021-10-28 ENCOUNTER — Encounter: Payer: Self-pay | Admitting: Internal Medicine

## 2021-10-28 DIAGNOSIS — Z6825 Body mass index (BMI) 25.0-25.9, adult: Secondary | ICD-10-CM

## 2021-10-28 DIAGNOSIS — F17210 Nicotine dependence, cigarettes, uncomplicated: Secondary | ICD-10-CM | POA: Diagnosis present

## 2021-10-28 DIAGNOSIS — Z981 Arthrodesis status: Secondary | ICD-10-CM

## 2021-10-28 DIAGNOSIS — J189 Pneumonia, unspecified organism: Secondary | ICD-10-CM | POA: Diagnosis not present

## 2021-10-28 DIAGNOSIS — E785 Hyperlipidemia, unspecified: Secondary | ICD-10-CM | POA: Diagnosis not present

## 2021-10-28 DIAGNOSIS — G4733 Obstructive sleep apnea (adult) (pediatric): Secondary | ICD-10-CM | POA: Diagnosis present

## 2021-10-28 DIAGNOSIS — Z880 Allergy status to penicillin: Secondary | ICD-10-CM

## 2021-10-28 DIAGNOSIS — S2232XD Fracture of one rib, left side, subsequent encounter for fracture with routine healing: Secondary | ICD-10-CM | POA: Diagnosis not present

## 2021-10-28 DIAGNOSIS — E1140 Type 2 diabetes mellitus with diabetic neuropathy, unspecified: Secondary | ICD-10-CM | POA: Diagnosis present

## 2021-10-28 DIAGNOSIS — E663 Overweight: Secondary | ICD-10-CM | POA: Diagnosis present

## 2021-10-28 DIAGNOSIS — Z95828 Presence of other vascular implants and grafts: Secondary | ICD-10-CM | POA: Diagnosis not present

## 2021-10-28 DIAGNOSIS — E1142 Type 2 diabetes mellitus with diabetic polyneuropathy: Secondary | ICD-10-CM | POA: Diagnosis present

## 2021-10-28 DIAGNOSIS — I480 Paroxysmal atrial fibrillation: Secondary | ICD-10-CM | POA: Diagnosis present

## 2021-10-28 DIAGNOSIS — S2232XA Fracture of one rib, left side, initial encounter for closed fracture: Secondary | ICD-10-CM | POA: Diagnosis present

## 2021-10-28 DIAGNOSIS — J449 Chronic obstructive pulmonary disease, unspecified: Secondary | ICD-10-CM | POA: Diagnosis not present

## 2021-10-28 DIAGNOSIS — K219 Gastro-esophageal reflux disease without esophagitis: Secondary | ICD-10-CM | POA: Diagnosis present

## 2021-10-28 DIAGNOSIS — I5022 Chronic systolic (congestive) heart failure: Secondary | ICD-10-CM | POA: Diagnosis present

## 2021-10-28 DIAGNOSIS — A419 Sepsis, unspecified organism: Secondary | ICD-10-CM | POA: Diagnosis present

## 2021-10-28 DIAGNOSIS — S2242XA Multiple fractures of ribs, left side, initial encounter for closed fracture: Secondary | ICD-10-CM | POA: Diagnosis present

## 2021-10-28 DIAGNOSIS — I10 Essential (primary) hypertension: Secondary | ICD-10-CM | POA: Diagnosis present

## 2021-10-28 DIAGNOSIS — W010XXA Fall on same level from slipping, tripping and stumbling without subsequent striking against object, initial encounter: Secondary | ICD-10-CM | POA: Diagnosis present

## 2021-10-28 DIAGNOSIS — Z79899 Other long term (current) drug therapy: Secondary | ICD-10-CM

## 2021-10-28 DIAGNOSIS — F418 Other specified anxiety disorders: Secondary | ICD-10-CM | POA: Diagnosis present

## 2021-10-28 DIAGNOSIS — I872 Venous insufficiency (chronic) (peripheral): Secondary | ICD-10-CM | POA: Diagnosis present

## 2021-10-28 DIAGNOSIS — R778 Other specified abnormalities of plasma proteins: Secondary | ICD-10-CM | POA: Diagnosis not present

## 2021-10-28 DIAGNOSIS — Z825 Family history of asthma and other chronic lower respiratory diseases: Secondary | ICD-10-CM

## 2021-10-28 DIAGNOSIS — Z20822 Contact with and (suspected) exposure to covid-19: Secondary | ICD-10-CM | POA: Diagnosis present

## 2021-10-28 DIAGNOSIS — R911 Solitary pulmonary nodule: Secondary | ICD-10-CM | POA: Diagnosis present

## 2021-10-28 DIAGNOSIS — E039 Hypothyroidism, unspecified: Secondary | ICD-10-CM | POA: Diagnosis present

## 2021-10-28 DIAGNOSIS — M1990 Unspecified osteoarthritis, unspecified site: Secondary | ICD-10-CM | POA: Diagnosis present

## 2021-10-28 DIAGNOSIS — I2510 Atherosclerotic heart disease of native coronary artery without angina pectoris: Secondary | ICD-10-CM | POA: Diagnosis present

## 2021-10-28 DIAGNOSIS — F419 Anxiety disorder, unspecified: Secondary | ICD-10-CM | POA: Diagnosis present

## 2021-10-28 DIAGNOSIS — R0789 Other chest pain: Secondary | ICD-10-CM | POA: Diagnosis present

## 2021-10-28 DIAGNOSIS — Z8249 Family history of ischemic heart disease and other diseases of the circulatory system: Secondary | ICD-10-CM

## 2021-10-28 DIAGNOSIS — I700 Atherosclerosis of aorta: Secondary | ICD-10-CM | POA: Diagnosis present

## 2021-10-28 DIAGNOSIS — Z888 Allergy status to other drugs, medicaments and biological substances status: Secondary | ICD-10-CM

## 2021-10-28 DIAGNOSIS — W19XXXA Unspecified fall, initial encounter: Secondary | ICD-10-CM | POA: Diagnosis present

## 2021-10-28 DIAGNOSIS — E1151 Type 2 diabetes mellitus with diabetic peripheral angiopathy without gangrene: Secondary | ICD-10-CM | POA: Diagnosis present

## 2021-10-28 DIAGNOSIS — Z86718 Personal history of other venous thrombosis and embolism: Secondary | ICD-10-CM | POA: Diagnosis not present

## 2021-10-28 DIAGNOSIS — G8929 Other chronic pain: Secondary | ICD-10-CM | POA: Diagnosis present

## 2021-10-28 DIAGNOSIS — Z9689 Presence of other specified functional implants: Secondary | ICD-10-CM | POA: Diagnosis present

## 2021-10-28 DIAGNOSIS — Z79890 Hormone replacement therapy (postmenopausal): Secondary | ICD-10-CM

## 2021-10-28 DIAGNOSIS — J9621 Acute and chronic respiratory failure with hypoxia: Secondary | ICD-10-CM | POA: Diagnosis present

## 2021-10-28 DIAGNOSIS — J441 Chronic obstructive pulmonary disease with (acute) exacerbation: Secondary | ICD-10-CM | POA: Diagnosis present

## 2021-10-28 DIAGNOSIS — R296 Repeated falls: Secondary | ICD-10-CM | POA: Diagnosis present

## 2021-10-28 DIAGNOSIS — Z8551 Personal history of malignant neoplasm of bladder: Secondary | ICD-10-CM

## 2021-10-28 DIAGNOSIS — G894 Chronic pain syndrome: Secondary | ICD-10-CM | POA: Diagnosis present

## 2021-10-28 DIAGNOSIS — F17200 Nicotine dependence, unspecified, uncomplicated: Secondary | ICD-10-CM | POA: Diagnosis not present

## 2021-10-28 DIAGNOSIS — Z885 Allergy status to narcotic agent status: Secondary | ICD-10-CM

## 2021-10-28 DIAGNOSIS — R001 Bradycardia, unspecified: Secondary | ICD-10-CM | POA: Diagnosis not present

## 2021-10-28 DIAGNOSIS — Z9981 Dependence on supplemental oxygen: Secondary | ICD-10-CM

## 2021-10-28 HISTORY — DX: Sepsis, unspecified organism: A41.9

## 2021-10-28 LAB — CBC WITH DIFFERENTIAL/PLATELET
Abs Immature Granulocytes: 0.11 10*3/uL — ABNORMAL HIGH (ref 0.00–0.07)
Basophils Absolute: 0.1 10*3/uL (ref 0.0–0.1)
Basophils Relative: 0 %
Eosinophils Absolute: 0.3 10*3/uL (ref 0.0–0.5)
Eosinophils Relative: 1 %
HCT: 47.2 % (ref 39.0–52.0)
Hemoglobin: 14.9 g/dL (ref 13.0–17.0)
Immature Granulocytes: 1 %
Lymphocytes Relative: 17 %
Lymphs Abs: 3.1 10*3/uL (ref 0.7–4.0)
MCH: 28.3 pg (ref 26.0–34.0)
MCHC: 31.6 g/dL (ref 30.0–36.0)
MCV: 89.7 fL (ref 80.0–100.0)
Monocytes Absolute: 1.5 10*3/uL — ABNORMAL HIGH (ref 0.1–1.0)
Monocytes Relative: 8 %
Neutro Abs: 13.6 10*3/uL — ABNORMAL HIGH (ref 1.7–7.7)
Neutrophils Relative %: 73 %
Platelets: 243 10*3/uL (ref 150–400)
RBC: 5.26 MIL/uL (ref 4.22–5.81)
RDW: 14.5 % (ref 11.5–15.5)
WBC: 18.6 10*3/uL — ABNORMAL HIGH (ref 4.0–10.5)
nRBC: 0 % (ref 0.0–0.2)

## 2021-10-28 LAB — EXPECTORATED SPUTUM ASSESSMENT W GRAM STAIN, RFLX TO RESP C

## 2021-10-28 LAB — BASIC METABOLIC PANEL
Anion gap: 7 (ref 5–15)
BUN: 17 mg/dL (ref 8–23)
CO2: 32 mmol/L (ref 22–32)
Calcium: 8.7 mg/dL — ABNORMAL LOW (ref 8.9–10.3)
Chloride: 98 mmol/L (ref 98–111)
Creatinine, Ser: 0.98 mg/dL (ref 0.61–1.24)
GFR, Estimated: >60 mL/min (ref >60)
Glucose, Bld: 167 mg/dL — ABNORMAL HIGH (ref 70–99)
Potassium: 4.5 mmol/L (ref 3.5–5.1)
Sodium: 137 mmol/L (ref 135–145)

## 2021-10-28 LAB — RESP PANEL BY RT-PCR (FLU A&B, COVID) ARPGX2
Influenza A by PCR: NEGATIVE
Influenza B by PCR: NEGATIVE
SARS Coronavirus 2 by RT PCR: NEGATIVE

## 2021-10-28 LAB — STREP PNEUMONIAE URINARY ANTIGEN: Strep Pneumo Urinary Antigen: NEGATIVE

## 2021-10-28 LAB — HEMOGLOBIN A1C
Hgb A1c MFr Bld: 6.2 % — ABNORMAL HIGH (ref 4.8–5.6)
Mean Plasma Glucose: 131.24 mg/dL

## 2021-10-28 LAB — PROTIME-INR
INR: 1.2 (ref 0.8–1.2)
Prothrombin Time: 14.7 seconds (ref 11.4–15.2)

## 2021-10-28 LAB — PROCALCITONIN: Procalcitonin: <0.1 ng/mL

## 2021-10-28 LAB — TROPONIN I (HIGH SENSITIVITY)
Troponin I (High Sensitivity): 40 ng/L — ABNORMAL HIGH (ref <18)
Troponin I (High Sensitivity): 32 ng/L — ABNORMAL HIGH (ref <18)
Troponin I (High Sensitivity): 29 ng/L — ABNORMAL HIGH (ref <18)

## 2021-10-28 LAB — LACTIC ACID, PLASMA
Lactic Acid, Venous: 1 mmol/L (ref 0.5–1.9)
Lactic Acid, Venous: 0.9 mmol/L (ref 0.5–1.9)

## 2021-10-28 LAB — BRAIN NATRIURETIC PEPTIDE: B Natriuretic Peptide: 135.4 pg/mL — ABNORMAL HIGH (ref 0.0–100.0)

## 2021-10-28 MED ORDER — SODIUM CHLORIDE 0.9 % IV SOLN
1.0000 g | INTRAVENOUS | Status: DC
  Administered 2021-10-29: 1 g via INTRAVENOUS
  Filled 2021-10-28: qty 10

## 2021-10-28 MED ORDER — HYDROMORPHONE HCL 1 MG/ML IJ SOLN
1.0000 mg | Freq: Once | INTRAMUSCULAR | Status: AC
  Administered 2021-10-28: 1 mg via INTRAVENOUS
  Filled 2021-10-28: qty 1

## 2021-10-28 MED ORDER — DM-GUAIFENESIN ER 30-600 MG PO TB12: 1.0000 | ORAL_TABLET | Freq: Two times a day (BID) | ORAL | Status: DC | PRN

## 2021-10-28 MED ORDER — AMITRIPTYLINE HCL 50 MG PO TABS
100.0000 mg | ORAL_TABLET | Freq: Every day | ORAL | Status: DC
  Administered 2021-10-28 – 2021-10-31 (×4): 100 mg via ORAL
  Filled 2021-10-28 (×4): qty 2

## 2021-10-28 MED ORDER — ALBUTEROL SULFATE (2.5 MG/3ML) 0.083% IN NEBU: 3.0000 mL | INHALATION_SOLUTION | RESPIRATORY_TRACT | Status: DC | PRN

## 2021-10-28 MED ORDER — HYDRALAZINE HCL 20 MG/ML IJ SOLN: 5.0000 mg | INTRAMUSCULAR | Status: DC | PRN

## 2021-10-28 MED ORDER — SIMVASTATIN 10 MG PO TABS
40.0000 mg | ORAL_TABLET | Freq: Every day | ORAL | Status: DC
  Administered 2021-10-28 – 2021-11-01 (×5): 40 mg via ORAL
  Filled 2021-10-28 (×5): qty 4

## 2021-10-28 MED ORDER — OXYCODONE-ACETAMINOPHEN 5-325 MG PO TABS: 1.0000 | ORAL_TABLET | ORAL | Status: DC | PRN

## 2021-10-28 MED ORDER — HYDROMORPHONE HCL 2 MG PO TABS
4.0000 mg | ORAL_TABLET | Freq: Three times a day (TID) | ORAL | Status: DC | PRN
  Administered 2021-10-28 – 2021-10-31 (×6): 4 mg via ORAL
  Filled 2021-10-28 (×7): qty 2

## 2021-10-28 MED ORDER — BISACODYL 5 MG PO TBEC
5.0000 mg | DELAYED_RELEASE_TABLET | Freq: Every day | ORAL | Status: DC | PRN
  Administered 2021-10-28 – 2021-10-31 (×3): 5 mg via ORAL
  Filled 2021-10-28 (×3): qty 1

## 2021-10-28 MED ORDER — ENOXAPARIN SODIUM 40 MG/0.4ML IJ SOSY
40.0000 mg | PREFILLED_SYRINGE | INTRAMUSCULAR | Status: DC
  Administered 2021-10-28 – 2021-10-31 (×4): 40 mg via SUBCUTANEOUS
  Filled 2021-10-28 (×4): qty 0.4

## 2021-10-28 MED ORDER — DULOXETINE HCL 60 MG PO CPEP
60.0000 mg | ORAL_CAPSULE | Freq: Every day | ORAL | Status: DC
  Administered 2021-10-28 – 2021-11-01 (×5): 60 mg via ORAL
  Filled 2021-10-28 (×5): qty 1

## 2021-10-28 MED ORDER — LATANOPROST 0.005 % OP SOLN
1.0000 [drp] | Freq: Every day | OPHTHALMIC | Status: DC
  Administered 2021-10-28 – 2021-10-31 (×4): 1 [drp] via OPHTHALMIC
  Filled 2021-10-28: qty 2.5

## 2021-10-28 MED ORDER — ADULT MULTIVITAMIN W/MINERALS CH
ORAL_TABLET | Freq: Every day | ORAL | Status: DC
  Administered 2021-10-28 – 2021-11-01 (×5): 1 via ORAL
  Filled 2021-10-28 (×5): qty 1

## 2021-10-28 MED ORDER — IOHEXOL 350 MG/ML SOLN
100.0000 mL | Freq: Once | INTRAVENOUS | Status: AC | PRN
  Administered 2021-10-28: 100 mL via INTRAVENOUS

## 2021-10-28 MED ORDER — SODIUM CHLORIDE 0.9 % IV SOLN
1.0000 g | Freq: Once | INTRAVENOUS | Status: AC
  Administered 2021-10-28: 1 g via INTRAVENOUS
  Filled 2021-10-28: qty 10

## 2021-10-28 MED ORDER — ONDANSETRON HCL 4 MG/2ML IJ SOLN
4.0000 mg | Freq: Three times a day (TID) | INTRAMUSCULAR | Status: DC | PRN
Start: 2021-10-28 — End: 2021-11-01
  Administered 2021-10-30: 4 mg via INTRAVENOUS
  Filled 2021-10-28: qty 2

## 2021-10-28 MED ORDER — MORPHINE SULFATE (PF) 2 MG/ML IV SOLN
2.0000 mg | INTRAVENOUS | Status: DC | PRN
  Administered 2021-10-28 (×2): 2 mg via INTRAVENOUS
  Filled 2021-10-28 (×2): qty 1

## 2021-10-28 MED ORDER — PREGABALIN 75 MG PO CAPS
75.0000 mg | ORAL_CAPSULE | Freq: Three times a day (TID) | ORAL | Status: DC
  Administered 2021-10-28 – 2021-11-01 (×13): 75 mg via ORAL
  Filled 2021-10-28 (×13): qty 1

## 2021-10-28 MED ORDER — LOSARTAN POTASSIUM 50 MG PO TABS
50.0000 mg | ORAL_TABLET | Freq: Every day | ORAL | Status: DC
  Administered 2021-10-28 – 2021-11-01 (×5): 50 mg via ORAL
  Filled 2021-10-28 (×5): qty 1

## 2021-10-28 MED ORDER — LEVOTHYROXINE SODIUM 50 MCG PO TABS
125.0000 ug | ORAL_TABLET | Freq: Every day | ORAL | Status: DC
  Administered 2021-10-28 – 2021-11-01 (×5): 125 ug via ORAL
  Filled 2021-10-28 (×5): qty 3

## 2021-10-28 MED ORDER — NICOTINE 21 MG/24HR TD PT24
21.0000 mg | MEDICATED_PATCH | Freq: Every day | TRANSDERMAL | Status: DC
  Administered 2021-10-28 – 2021-11-01 (×4): 21 mg via TRANSDERMAL
  Filled 2021-10-28 (×5): qty 1

## 2021-10-28 MED ORDER — IOHEXOL 300 MG/ML  SOLN: 100.0000 mL | Freq: Once | INTRAMUSCULAR | Status: DC | PRN

## 2021-10-28 MED ORDER — SODIUM CHLORIDE 0.9 % IV SOLN
500.0000 mg | Freq: Once | INTRAVENOUS | Status: AC
  Administered 2021-10-28: 500 mg via INTRAVENOUS
  Filled 2021-10-28: qty 5

## 2021-10-28 MED ORDER — SODIUM CHLORIDE 0.9 % IV SOLN
500.0000 mg | INTRAVENOUS | Status: DC
  Administered 2021-10-29: 500 mg via INTRAVENOUS
  Filled 2021-10-28 (×2): qty 5

## 2021-10-28 MED ORDER — HYDROMORPHONE HCL 1 MG/ML IJ SOLN
1.0000 mg | INTRAMUSCULAR | Status: DC | PRN
  Administered 2021-10-28 – 2021-10-29 (×2): 1 mg via INTRAVENOUS
  Filled 2021-10-28 (×3): qty 1

## 2021-10-28 MED ORDER — ACETAMINOPHEN 325 MG PO TABS
650.0000 mg | ORAL_TABLET | Freq: Four times a day (QID) | ORAL | Status: DC | PRN
  Administered 2021-10-28 – 2021-10-30 (×3): 650 mg via ORAL
  Filled 2021-10-28 (×3): qty 2

## 2021-10-28 MED ORDER — METHOCARBAMOL 500 MG PO TABS
500.0000 mg | ORAL_TABLET | Freq: Two times a day (BID) | ORAL | Status: DC | PRN
  Administered 2021-10-28 – 2021-10-30 (×5): 500 mg via ORAL
  Filled 2021-10-28 (×6): qty 1

## 2021-10-28 MED ORDER — ONDANSETRON HCL 4 MG/2ML IJ SOLN
4.0000 mg | Freq: Once | INTRAMUSCULAR | Status: AC
  Administered 2021-10-28: 4 mg via INTRAVENOUS
  Filled 2021-10-28: qty 2

## 2021-10-28 MED ORDER — FENTANYL CITRATE PF 50 MCG/ML IJ SOSY
50.0000 ug | PREFILLED_SYRINGE | Freq: Once | INTRAMUSCULAR | Status: AC
  Administered 2021-10-28: 50 ug via INTRAVENOUS
  Filled 2021-10-28: qty 1

## 2021-10-28 MED ORDER — IPRATROPIUM-ALBUTEROL 0.5-2.5 (3) MG/3ML IN SOLN
3.0000 mL | RESPIRATORY_TRACT | Status: DC
  Administered 2021-10-28 – 2021-10-29 (×5): 3 mL via RESPIRATORY_TRACT
  Filled 2021-10-28 (×5): qty 3

## 2021-10-28 MED ORDER — LIDOCAINE 5 % EX PTCH
1.0000 | MEDICATED_PATCH | CUTANEOUS | Status: DC
  Administered 2021-10-28 – 2021-10-29 (×2): 1 via TRANSDERMAL
  Filled 2021-10-28 (×2): qty 1

## 2021-10-28 MED ORDER — ASPIRIN EC 81 MG PO TBEC
81.0000 mg | DELAYED_RELEASE_TABLET | Freq: Every day | ORAL | Status: DC
  Administered 2021-10-28 – 2021-11-01 (×5): 81 mg via ORAL
  Filled 2021-10-28 (×5): qty 1

## 2021-10-28 NOTE — ED Triage Notes (Signed)
Presents to ED with EMS for multiple falls this week. Seen Thurs for (L)rib fx. Fell last night and slept on floor. PO dilaudid at home not helping pain. Unable to deep breathe. Sats <90% on RA.  ?

## 2021-10-28 NOTE — H&P (Addendum)
?History and Physical  ? ? ?KENDARIOUS GUDINO JIR:[id number] DOB: 07-27-1945 DOA: 10/28/2021 ? ?Referring MD/NP/PA:  ? ?PCP: Adin Hector, MD  ? ?Patient coming from:  The patient is coming from home.   ? ?Chief Complaint: fall, chest wall pain, cough, SOB ? ?HPI: [name] is a 76 y.o. male with medical history significant of COPD on 2.5 L of O2, HTN, HLD, diet controlled DM, hypothyroidism, depression with anxiety, OSA, DVT (s/p of IVC filter), PVD, CAD, sCHF with EF of 35%, chronic venous insufficient change in legs, PAF not on anticoagulants, tobacco abuse, chronic pain, who presents with fall, chest wall pain, cough and SOB. ? ?Pt states that he tripped and fell 2 days ago, striking the left side of his chest on the foot of his bed.  He was seen in the ED on 4/7. X-ray showed fracture of his left sixth lateral rib.  He was prescribed muscle relaxants to take in addition to his usual 4 mg of p.o. Dilaudid prescribed for chronic back and leg pain.  Patient states that he continues to have pain in left side of chest wall, which is constant, sharp, severe, nonradiating.  It is not controlled by his chronic pain medications. ? ?He also has shortness of breath and cough with difficulty clearing any sputum.  Denies frontal chest pain.  No fever or chills.  Patient has nausea, no vomiting, diarrhea or abdominal pain.  No symptoms of UTI. He reports additional falls over the past 2 days, states he remembers falling once last night and was told by a his daughter that pt had fallen second time.  No LOC.  No head or neck injury. ? ?Patient was found to have oxygen desaturation to 85% on home level of oxygen, currently 92 to 93% on 3-4 L oxygen in ED. ? ?Data Reviewed and ED Course: pt was found to have negative COVID PCR, WBC 18.6, troponin level 40, GFR> 60, temperature normal, blood pressure 116/73, heart rate 93, RR 24, chest x-ray showed right lower lobe infiltration with bronchitic change.  CT of the head and CT of  the C-spine negative for acute injury.  Patient is admitted to telemetry bed as inpatient ? ?CTA: ?1. No pulmonary embolism. ?2. Acute displaced fractures of the LEFT lateral fifth and sixth ribs. ?3. Small LEFT pleural effusion. No pneumothorax. ?4. New patchy consolidations at the bilateral lung bases, most ?likely atelectasis. ?5. Three-vessel coronary artery calcifications. ?  ?Aortic Atherosclerosis (ICD10-I70.0) and Emphysema (ICD10-J43.9). ? ?EKG: I have personally reviewed.  Sinus rhythm, QTc 497, LAE, PVC, poor R wave progression, LAD. ? ? ?Review of Systems:  ? ?General: no fevers, chills, no body weight gain, has fatigue ?HEENT: no blurry vision, hearing changes or sore throat ?Respiratory: has dyspnea, coughing, no wheezing ?CV: has left chest wall pain, no palpitations ?GI: has nausea, no vomiting, abdominal pain, diarrhea, constipation ?GU: no dysuria, burning on urination, increased urinary frequency, hematuria  ?Ext: no leg edema ?Neuro: no unilateral weakness, numbness, or tingling, no vision change or hearing loss. Has fall. ?Skin: no rash, no skin tear. ?MSK: has left sided chest wall pain ?Heme: No easy bruising.  ?Travel history: No recent long distant travel. ? ? ?Allergy:  ?Allergies  ?Allergen Reactions  ? Celebrex [Celecoxib] Nausea Only  ? Codeine Nausea Only  ? Penicillins Other (See Comments)  ?  unknown ?Other reaction(s): Other (See Comments), Unknown ?unknown  ? ? ?Past Medical History:  ?Diagnosis Date  ? Anxiety   ?  Arthritis   ? INFLAMMATORY POLYARTHROPATHY  ? Asthma   ? Cancer [facility])   ? CHF (congestive heart failure) ([place])   ? COPD (chronic obstructive pulmonary disease) ([place])   ? Coronary artery disease   ? 2000  ? Depression   ? Diabetes mellitus without complication ([place])   ? DVT of lower extremity, bilateral ([place])   ? Dysrhythmia   ? afib  ? Hypertension   ? Neuropathy   ? Pain   ? chronic syndrome  ? Peripheral vascular disease ([place])   ? S/P IVC filter   ? removed  ? Sleep  apnea   ? o2  2.5 l hs  ? Sleep disorder   ? Stasis dermatitis of both legs   ? ? ?Past Surgical History:  ?Procedure Laterality Date  ? BACK SURGERY    ? lumbar fusion  ? BLADDER TUMOR EXCISION    ? CERVICAL FUSION    ? COLON SURGERY    ? bowel blockage  ? COLONOSCOPY WITH PROPOFOL N/A 02/27/2018  ? Procedure: COLONOSCOPY WITH PROPOFOL;  Surgeon: Manya Silvas, MD;  Location: [facility] ENDOSCOPY;  Service: Endoscopy;  Laterality: N/A;  ? CORONARY ANGIOPLASTY    ? 2000  ? INFUSION PUMP REPLACEMENT    ? INTRATHECAL PUMP IMPLANTATION    ? MASTOIDECTOMY    ? x 3  ? NISSEN FUNDOPLICATION    ? 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  ? Procedure: TRANSURETHRAL RESECTION OF BLADDER TUMOR (TURBT);  Surgeon: Royston Cowper, MD;  Location: [facility] ORS;  Service: Urology;  Laterality: N/A;  ? ? ?Social History:  reports that he has been smoking cigarettes. He has a 44.25 pack-year smoking history. He has never used smokeless tobacco. He reports that he does not drink alcohol and does not use drugs. ? ?Family History:  ?Family History  ?Problem Relation Age of Onset  ? Heart disease Mother   ? Emphysema Father   ? Heart attack Father   ? Emphysema Sister   ? Heart disease Brother   ?  ? ?Prior to Admission medications   ?Medication Sig Start Date End Date Taking? Authorizing Provider  ?albuterol (VENTOLIN HFA) 108 (90 Base) MCG/ACT inhaler Inhale into the lungs every 6 (six) hours as needed for wheezing or shortness of breath.    [provider]  ?albuterol (VENTOLIN) (5 MG/ML) 0.5% NEBU Inhale into the lungs.    [provider]  ?Albuterol Sulfate (PROAIR RESPICLICK) 614 (90 Base) MCG/ACT AEPB Inhale into the lungs.    [provider]  ?amitriptyline (ELAVIL) 100 MG tablet Take 100 mg by mouth at bedtime.    [provider]  ?azelastine (ASTELIN) 0.1 % nasal spray USE 1 SPRAY IN EACH NOSTRIL TWICE DAILY AS NEEDED FOR RHINITIS 06/19/20   [provider]  ?bisacodyl  (DULCOLAX) 5 MG EC tablet Take 5 mg by mouth daily as needed for moderate constipation.    [provider]  ?cloNIDine (CATAPRES) 0.1 MG tablet  03/28/21   [provider]  ?DULoxetine (CYMBALTA) 60 MG capsule TAKE ONE CAPSULE BY MOUTH ONCE EVERY DAY 10/11/20   [provider]  ?HYDROmorphone (DILAUDID) 4 MG tablet Take 1 tablet (4 mg total) by mouth every 8 (eight) hours as needed. Must last 30 days. 10/29/21 11/28/21  Milinda Pointer, MD  ?HYDROmorphone (DILAUDID) 4 MG tablet Take 1 tablet (4 mg total) by mouth every 8 (eight) hours as needed. Must last 30 days. 11/28/21 12/28/21  Milinda Pointer, MD  ?  HYDROmorphone (DILAUDID) 4 MG tablet Take 1 tablet (4 mg total) by mouth every 8 (eight) hours as needed. Must last 30 days. 12/28/21 01/27/22  Milinda Pointer, MD  ?latanoprost (XALATAN) 0.005 % ophthalmic solution SMARTSIG:1 Drop(s) In Eye(s) Every Evening 05/29/21   [provider]  ?levothyroxine (SYNTHROID) 125 MCG tablet Take 125 mcg by mouth daily. 03/29/21   [provider]  ?losartan (COZAAR) 50 MG tablet Take 1 tablet by mouth daily. 10/14/19   [provider]  ?methocarbamol (ROBAXIN) 500 MG tablet Take 1 tablet (500 mg total) by mouth every 12 (twelve) hours as needed for muscle spasms. 10/26/21   Victorino Dike, FNP  ?Multiple Vitamin (MULTIVITAMIN ADULT PO) Take 1 capsule by mouth daily.    [provider]  ?naloxone (NARCAN) nasal spray 4 mg/0.1 mL Place into the nose. 02/05/18   [provider]  ?pregabalin (LYRICA) 75 MG capsule Take 1 capsule (75 mg total) by mouth 3 (three) times daily. 10/17/21 01/15/22  Milinda Pointer, MD  ?promethazine (PHENERGAN) 12.5 MG tablet Take 12.5 mg by mouth every 6 (six) hours as needed for nausea or vomiting.    [provider]  ?simvastatin (ZOCOR) 40 MG tablet Take 50 mg by mouth daily.    [provider]  ?Tiotropium Bromide-Olodaterol (STIOLTO RESPIMAT IN) Inhale into the lungs.     [provider]  ? ? ?Physical Exam: ?Vitals:  ? 10/28/21 0655 10/28/21 0657 10/28/21 0700 10/28/21 0750  ?BP:   (!) 128/99 (!) 109/95  ?Pulse: 87   86  ?Resp: 20  20 (!) 24  ?Temp:      ?TempSrc:

## 2021-10-28 NOTE — ED Provider Notes (Signed)
? ?[facility] ?Provider Note ? ? ? Event Date/Time  ? First MD Initiated Contact with Patient 10/28/21 0533   ?  (approximate) ? ? ?History  ? ?Chief Complaint ?Fall ? ? ?HPI ? ?[name] is a 76 y.o. male with past medical history of hypertension, diabetes, CAD, CHF, COPD, atrial fibrillation, DVT, and chronic pain syndrome who presents to the ED complaining of chest wall pain.  Patient reports that he tripped and fell 2 days ago, striking the left side of his chest on the foot of his bed.  He was seen in the ED at that time and found to have a fracture of his left sixth rib.  He was prescribed muscle relaxants to take in addition to his usual 4 mg of p.o. Dilaudid prescribed for chronic back and leg pain.  He states that he has continued to have severe pain since then, not alleviated by his chronic pain medication.  He reports a cough with difficulty clearing any sputum and has begun to feel short of breath.  He denies any fevers and has not noticed any pain or swelling in his legs.  He does report additional falls over the past 2 days, states he remembers falling once last night and was told by a family member that he had fallen a second time.  He denies hitting his head or losing consciousness, does not currently take any blood thinners.  He reportedly had a hard time getting comfortable and ended up sleeping a significant portion of the night laying on the floor. ?  ? ? ?Physical Exam  ? ?Triage Vital Signs: ?ED Triage Vitals  ?Enc Vitals Group  ?   BP --   ?   Pulse --   ?   Resp --   ?   Temp --   ?   Temp src --   ?   SpO2 10/28/21 0529 92 %  ?   Weight --   ?   Height --   ?   Head Circumference --   ?   Peak Flow --   ?   Pain Score 10/28/21 0530 10  ?   Pain Loc --   ?   Pain Edu? --   ?   Excl. in [place]? --   ? ? ?Most recent vital signs: ?Vitals:  ? 10/28/21 0640 10/28/21 0647  ?BP:    ?Pulse: 89 88  ?Resp: 18 15  ?Temp:    ?SpO2: 90% 92%  ? ? ?Constitutional: Alert and  oriented. ?Eyes: Conjunctivae are normal. ?Head: Atraumatic. ?Nose: No congestion/rhinnorhea. ?Mouth/Throat: Mucous membranes are moist.  ?Neck: No midline cervical spine tenderness to palpation. ?Cardiovascular: Normal rate, regular rhythm. Grossly normal heart sounds.  2+ radial pulses bilaterally. ?Respiratory: Normal respiratory effort.  No retractions. Lungs CTAB.  Left lateral chest wall tenderness to palpation. ?Gastrointestinal: Soft and nontender. No distention. ?Musculoskeletal: No lower extremity tenderness nor edema.  No upper extremity bony tenderness to palpation. ?Neurologic:  Normal speech and language. No gross focal neurologic deficits are appreciated. ? ? ? ?ED Results / Procedures / Treatments  ? ?Labs ?(all labs ordered are listed, but only abnormal results are displayed) ?Labs Reviewed  ?CBC WITH DIFFERENTIAL/PLATELET - Abnormal; Notable for the following components:  ?    Result Value  ? WBC 18.6 (*)   ? Neutro Abs 13.6 (*)   ? Monocytes Absolute 1.5 (*)   ? Abs Immature Granulocytes 0.11 (*)   ? All  other components within normal limits  ?BASIC METABOLIC PANEL - Abnormal; Notable for the following components:  ? Glucose, Bld 167 (*)   ? Calcium 8.7 (*)   ? All other components within normal limits  ?TROPONIN I (HIGH SENSITIVITY) - Abnormal; Notable for the following components:  ? Troponin I (High Sensitivity) 40 (*)   ? All other components within normal limits  ?RESP PANEL BY RT-PCR (FLU A&B, COVID) ARPGX2  ? ? ? ?EKG ? ?ED ECG REPORT ?Tempie Hoist, the attending physician, personally viewed and interpreted this ECG. ? ? Date: 10/28/2021 ? EKG Time: 5:37 ? Rate: 94 ? Rhythm: normal sinus rhythm, occasional PVCs ? Axis: LAD ? Intervals:nonspecific intraventricular conduction delay ? ST&T Change: None ? ?RADIOLOGY ?Chest x-ray reviewed by me with multifocal infiltrates concerning for pneumonia. ? ?PROCEDURES: ? ?Critical Care performed: Yes, see critical care procedure  note(s) ? ?.Critical Care ?Performed by: Blake Divine, MD ?Authorized by: Blake Divine, MD  ? ?Critical care provider statement:  ?  Critical care time (minutes):  30 ?  Critical care time was exclusive of:  Separately billable procedures and treating other patients and teaching time ?  Critical care was necessary to treat or prevent imminent or life-threatening deterioration of the following conditions:  Respiratory failure ?  Critical care was time spent personally by me on the following activities:  Development of treatment plan with patient or surrogate, discussions with consultants, evaluation of patient's response to treatment, examination of patient, ordering and review of laboratory studies, ordering and review of radiographic studies, ordering and performing treatments and interventions, pulse oximetry, re-evaluation of patient's condition and review of old charts ?  I assumed direction of critical care for this patient from another provider in my specialty: no   ?  Care discussed with: admitting provider   ? ? ?MEDICATIONS ORDERED IN ED: ?Medications  ?lidocaine (LIDODERM) 5 % 1 patch (1 patch Transdermal Patch Applied 10/28/21 0553)  ?cefTRIAXone (ROCEPHIN) 1 g in sodium chloride 0.9 % 100 mL IVPB (1 g Intravenous New Bag/Given 10/28/21 0636)  ?azithromycin (ZITHROMAX) 500 mg in sodium chloride 0.9 % 250 mL IVPB (has no administration in time range)  ?HYDROmorphone (DILAUDID) injection 1 mg (1 mg Intravenous Given 10/28/21 0550)  ?ondansetron [facility]) injection 4 mg (4 mg Intravenous Given 10/28/21 0558)  ? ? ? ?IMPRESSION / MDM / ASSESSMENT AND PLAN / ED COURSE  ?I reviewed the triage vital signs and the nursing notes. ?             ?               ? ?76 y.o. male with past medical history of hypertension, diabetes, CAD, CHF, COPD, atrial fibrillation, DVT, and chronic pain syndrome who presents to the ED complaining of ongoing left chest wall pain with cough and some difficulty breathing following a fall  2 days ago. ? ?Differential diagnosis includes, but is not limited to, rib fracture, pneumonia, pneumothorax, hemothorax, ACS, PE, chronic pain. ? ?Patient uncomfortable appearing, occasionally yelling out in pain related to his left chest wall.  He has borderline low O2 sats on room air and we will attempt to control pain with IV Dilaudid given his high pain medication tolerance to hopefully allow him to take deeper breaths.  We will repeat EKG to assess for pneumonia, pneumothorax, or hemothorax.  EKG shows no evidence of arrhythmia or ischemia, labs including troponin are pending.  He does not have any apparent other injuries from additional  falls, will attempt to obtain additional history from daughter. ? ?Patient noted to drop O2 sats into the mid 80s despite IV pain medication, subsequently placed on 3 L nasal cannula with improvement.  Chest x-ray shows no new rib fractures, does appear concerning for right lower lobe infiltrate and potential pneumonia.  With patient's symptoms and labs also showing significant leukocytosis, would favor pneumonia over atelectasis and we will start patient on Rocephin and azithromycin.  Pain is improved on reassessment following dose of IV Dilaudid.  Daughter at bedside does clarify that patient's second fall was unwitnessed and patient is unsure whether he hit his head, states he does not remember the fall.  CT head and cervical spine are negative for traumatic injury related to the fall.  BMP shows no AKI or electrolyte abnormality, troponin is mildly elevated and this may represent strain related to pneumonia.  Case discussed with hospitalist for admission. ? ?  ? ? ?FINAL CLINICAL IMPRESSION(S) / ED DIAGNOSES  ? ?Final diagnoses:  ?Closed fracture of one rib of left side with routine healing, subsequent encounter  ?Community acquired pneumonia, unspecified laterality  ?Chest wall pain  ?Multiple falls  ? ? ? ?Rx / DC Orders  ? ?ED Discharge Orders   ? ? None  ? ?   ? ? ? ?Note:  This document was prepared using Dragon voice recognition software and may include unintentional dictation errors. ?  ?Blake Divine, MD ?10/28/21 [phone] ? ?

## 2021-10-28 NOTE — ED Notes (Signed)
Patient transported to CT 

## 2021-10-28 NOTE — Progress Notes (Signed)
Tele notified RN of pt having several chaotic PVCs on tele monitor.  Pt is resting , yet in a lot of pain r/t rib fractures.  Notified MD, will continue to monitor.   ?

## 2021-10-29 DIAGNOSIS — J9621 Acute and chronic respiratory failure with hypoxia: Secondary | ICD-10-CM | POA: Diagnosis not present

## 2021-10-29 DIAGNOSIS — E663 Overweight: Secondary | ICD-10-CM

## 2021-10-29 DIAGNOSIS — S2232XA Fracture of one rib, left side, initial encounter for closed fracture: Secondary | ICD-10-CM

## 2021-10-29 DIAGNOSIS — I5022 Chronic systolic (congestive) heart failure: Secondary | ICD-10-CM | POA: Diagnosis not present

## 2021-10-29 DIAGNOSIS — J441 Chronic obstructive pulmonary disease with (acute) exacerbation: Secondary | ICD-10-CM | POA: Diagnosis not present

## 2021-10-29 LAB — CBC
HCT: 42.1 % (ref 39.0–52.0)
Hemoglobin: 13.2 g/dL (ref 13.0–17.0)
MCH: 28.6 pg (ref 26.0–34.0)
MCHC: 31.4 g/dL (ref 30.0–36.0)
MCV: 91.1 fL (ref 80.0–100.0)
Platelets: 200 10*3/uL (ref 150–400)
RBC: 4.62 MIL/uL (ref 4.22–5.81)
RDW: 14.6 % (ref 11.5–15.5)
WBC: 13 10*3/uL — ABNORMAL HIGH (ref 4.0–10.5)
nRBC: 0 % (ref 0.0–0.2)

## 2021-10-29 LAB — LIPID PANEL
Cholesterol: 102 mg/dL (ref 0–200)
HDL: 37 mg/dL — ABNORMAL LOW (ref >40)
LDL Cholesterol: 52 mg/dL (ref 0–99)
Total CHOL/HDL Ratio: 2.8 RATIO
Triglycerides: 64 mg/dL (ref <150)
VLDL: 13 mg/dL (ref 0–40)

## 2021-10-29 LAB — GLUCOSE, CAPILLARY: Glucose-Capillary: 132 mg/dL — ABNORMAL HIGH (ref 70–99)

## 2021-10-29 MED ORDER — IPRATROPIUM-ALBUTEROL 0.5-2.5 (3) MG/3ML IN SOLN
3.0000 mL | Freq: Four times a day (QID) | RESPIRATORY_TRACT | Status: DC
Start: 2021-10-29 — End: 2021-10-30
  Administered 2021-10-29 – 2021-10-30 (×4): 3 mL via RESPIRATORY_TRACT
  Filled 2021-10-29 (×5): qty 3

## 2021-10-29 MED ORDER — KETOROLAC TROMETHAMINE 15 MG/ML IJ SOLN
15.0000 mg | Freq: Three times a day (TID) | INTRAMUSCULAR | Status: AC
  Administered 2021-10-29 – 2021-11-01 (×9): 15 mg via INTRAVENOUS
  Filled 2021-10-29 (×9): qty 1

## 2021-10-29 MED ORDER — ORAL CARE MOUTH RINSE
15.0000 mL | Freq: Two times a day (BID) | OROMUCOSAL | Status: DC
  Administered 2021-10-29 – 2021-11-01 (×4): 15 mL via OROMUCOSAL

## 2021-10-29 MED ORDER — LIDOCAINE 5 % EX PTCH
2.0000 | MEDICATED_PATCH | CUTANEOUS | Status: DC
  Administered 2021-10-30 – 2021-11-01 (×3): 2 via TRANSDERMAL
  Filled 2021-10-29 (×3): qty 2

## 2021-10-29 MED ORDER — LIDOCAINE 5 % EX PTCH: 1.0000 | MEDICATED_PATCH | CUTANEOUS | Status: DC

## 2021-10-29 NOTE — Progress Notes (Signed)
[facility] Progress Note ? ?Patient: [name]    YHC:[id number]  DOA: 10/28/2021    ?Date of Service: the patient was seen and examined on 10/29/2021 ? ?Brief hospital course: ?76 year old male with past medical history of chronic respiratory failure 2.5 L nasal cannula from COPD hypertension, hypothyroidism, obstructive sleep apnea, systolic CHF with ejection fraction 35% and PVD was paroxysmal A-fib who presented to the emergency room on 4/9 complaining of cough and shortness of breath as well as a recent fall 2 days prior where he had hit the left side of his chest on the foot of his bed.  Emergency room, patient found to have 6 lateral rib fracture left side as well as hypoxia on 2.5 L, requiring 3 to 4 L, and more concerning, sepsis secondary to community-acquired pneumonia.  Patient was brought in under the hospitalist service and started on fluids, antibiotics and supplemental oxygen. ? ?Assessment and Plan: ?Assessment and Plan: ?Acute on chronic respiratory failure with hypoxia ([place]) ?Baseline of 2.5 L.  Currently on 4.  No evidence of pneumonia.  I suspect that between COPD exacerbation as well as difficulty taking in deep inspiration causing this.  No evidence of CHF.  Would plan to continue nebulizers, oxygen support and encouragement of deep inspiration with incentive spirometry. ? ?COPD (chronic obstructive pulmonary disease) ([place]) ?Continue nebulizers and supplemental oxygen.  No evidence of infection given normal procalcitonin.  Suspect leukocytosis was more reactive.  Afebrile. ? ?Left rib fracture ?Secondary to fall.  We will try some better pain control by adding a second Lidoderm patch and scheduled Toradol for today.  If this works, will change to as needed. ? ?Fall ?Patient and patient's daughter report a number of falls, approximately several year.  Had extensive discussion with patient and he is willing to think about home physical therapy.  In the meantime, we will work on pain  control following this rib fracture. ? ?Chronic systolic CHF (congestive heart failure) ([place]) ?Ejection fraction of 35 to 40%.  No valvular stenosis.  Currently euvolemic to slightly dry. ? ?Elevated troponin ?Stable, likely secondary to trauma with some respiratory component.  No evidence of ACS. ? ?Hypertension ?Blood pressure stable during this hospitalization. ? ?CAD (coronary artery disease) ?Stable, see above in regards to troponins ? ?PAF (paroxysmal atrial fibrillation) ([place]) ?Stable.  Heart rate controlled.  Not on anticoagulation. ? ?Hyperlipidemia ?Continue statin ? ?Acquired hypothyroidism ?Continue Synthroid ? ?Overweight (BMI 25.0-29.9) ?Meets criteria with BMI greater than 25 ? ? ? ? ? ? ?Body mass index is 26.67 kg/m?.  ?  ?   ? ?Consultants: ?None ? ?Procedures: ?None ? ?Antimicrobials: ?IV Rocephin and Zithromax 4/9 only ? ?Code Status: Full code ? ? ?Subjective: Complains of left-sided chest wall pain, worse with deep inspiration ? ?Objective: ?Vital signs were reviewed and unremarkable. ?Vitals:  ? 10/29/21 0849 10/29/21 1355  ?BP:    ?Pulse:    ?Resp:    ?Temp:    ?SpO2: 98% 96%  ? ? ?Intake/Output Summary (Last 24 hours) at 10/29/2021 1451 ?Last data filed at 10/29/2021 1021 ?Gross per 24 hour  ?Intake 120 ml  ?Output --  ?Net 120 ml  ? ?Filed Weights  ? 10/28/21 1100  ?Weight: 96.8 kg  ? ?Body mass index is 26.67 kg/m?. ? ?Exam: ? ?General: Alert and oriented x3, no acute distress ?HEENT: Normocephalic and atraumatic, mucous membranes are moist ?Cardiovascular: Regular rate and rhythm, S1-S2 ?Respiratory: Poor inspiratory effort secondary to pain ?Abdomen: Soft, nontender, nondistended, positive  bowel sounds ?Musculoskeletal: No clubbing or cyanosis or edema ?Skin: No skin breaks, tears or lesions ?Psychiatry: Appropriate, no evidence of psychoses ?Neurology: No focal deficits ? ?Data Reviewed: ?Noted HDL 37.  Stable troponins.  White count improved from 18.6, now at 13 ? ?Disposition:  ?Status  is: Inpatient ?Remains inpatient appropriate because: Improvement in hypoxia ?  ? ?Anticipated discharge date: 4/11 or 4/12 ? ?Remaining issues to be resolved so that patient can be discharged: Oxygenation, pain control ? ? ?Family Communication: Daughter at the bedside ?DVT Prophylaxis: ?enoxaparin (LOVENOX) injection 40 mg Start: 10/28/21 2200 ? ? ? ?Author: ?Annita Brod ,MD ?10/29/2021 2:51 PM ? ?To reach On-call, see care teams to locate the attending and reach out via [email or web address]. ?Between 7PM-7AM, please contact night-coverage ?If you still have difficulty reaching the attending provider, please page the [facility] (Director on Call) for [facility] on amion for assistance. ? ?

## 2021-10-29 NOTE — Assessment & Plan Note (Addendum)
Patient uses 2.5 L of supplemental oxygen at baseline.  He presented with hypoxia requiring 4 L. ?Chest x-ray: no evidence of pneumonia.  Suspect this could be due to mild COPD exacerbation as well as difficulty in taking deep breath because of left-sided rib fractures.  No evidence of CHF.  Continue nebulizers, oxygen support and encouragement of deep inspiration with incentive spirometry.  Have been able to wean down to 2 L. ?Continue supplemental oxygen as prescribed ?

## 2021-10-29 NOTE — Assessment & Plan Note (Addendum)
Continue supplemental oxygen.   ?Continue nebulized bronchodilators.  ? No evidence of infection given normal procalcitonin.   ?Suspect leukocytosis was more reactive and has since normalized.  Afebrile. ?

## 2021-10-29 NOTE — Assessment & Plan Note (Addendum)
Echo: LVEF 35 to 40%.  No valvular stenosis.   ?Currently appears euvolemic, not  fluid overloaded. ?

## 2021-10-29 NOTE — Progress Notes (Signed)
OT Cancellation Note ? ?Patient Details ?Name: [name] ?MRN: [id number] ?DOB: [birth year] ? ? ?Cancelled Treatment:    Reason Eval/Treat Not Completed: Patient declined, no reason specified. Order received and chart reviewed. Upon arrival to room, pt awake and seated upright in bed. Pt educated on role of OT while in acute care setting, importance of continued participation in self-care tasks/functional mobility to maintain strength and activity tolerance while admitted, and opportunities to learn non-pharmacological pain management strategies during self-care tasks, however pt declining to participate in OT evaluation, declining OT services following hopsital d/c, and reporting that his wife will be assisting pt with ADLs following hospital discharge. OT to sign off at this time; MD informed. Please re-consult if additional OT needs arise.  ? ?Fredirick Maudlin, OTR/L ?[place] ? ?

## 2021-10-29 NOTE — TOC Initial Note (Signed)
Transition of Care (TOC) - Initial/Assessment Note  ? ? ?Patient Details  ?Name: [name] ?MRN: [id number] ?Date of Birth: 1946-02-01 ? ?Transition of Care (TOC) CM/SW Contact:    ?Candie Chroman, LCSW ?Phone Number: ?10/29/2021, 10:20 AM ? ?Clinical Narrative:   Readmission prevention screen complete. CSW met with patient. No supports at bedside. CSW introduced role and explained that discharge planning would be discussed. PCP is Ramonita Lab, MD. Wife and daughter drive him to appointments. Pharmacy is [facility] on corner of [address]. No issues obtaining medications. No home health prior to admission. He uses a RW to get around at home. He is on acute oxygen. Will follow for this need as well. His wife or daughter will pick him up at discharge. No further concerns. CSW encouraged patient to contact CSW as needed. CSW will continue to follow patient for support and facilitate return home when stable.              ? ?Expected Discharge Plan: Home/Self Care ?Barriers to Discharge: Continued Medical Work up ? ? ?Patient Goals and CMS Choice ?  ?  ?  ? ?Expected Discharge Plan and Services ?Expected Discharge Plan: Home/Self Care ?  ?  ?Post Acute Care Choice: NA ?Living arrangements for the past 2 months: [place] ?                ?  ?  ?  ?  ?  ?  ?  ?  ?  ?  ? ?Prior Living Arrangements/Services ?Living arrangements for the past 2 months: [place] ?Lives with:: Spouse ?Patient language and need for interpreter reviewed:: Yes ?Do you feel safe going back to the place where you live?: Yes      ?Need for Family Participation in Patient Care: Yes (Comment) ?Care giver support system in place?: Yes (comment) ?Current home services: DME ?Criminal Activity/Legal Involvement Pertinent to Current Situation/Hospitalization: No - Comment as needed ? ?Activities of Daily Living ?Home Assistive Devices/Equipment: [name] (specify type) ?ADL Screening (condition at time of  admission) ?Patient's cognitive ability adequate to safely complete daily activities?: Yes ?Is the patient deaf or have difficulty hearing?: No ?Does the patient have difficulty seeing, even when wearing glasses/contacts?: Yes ?Does the patient have difficulty concentrating, remembering, or making decisions?: No ?Patient able to express need for assistance with ADLs?: No ?Does the patient have difficulty dressing or bathing?: No ?Independently performs ADLs?: Yes (appropriate for developmental age) ?Does the patient have difficulty walking or climbing stairs?: No ?Weakness of Legs: Both ?Weakness of Arms/Hands: Both ? ?Permission Sought/Granted ?  ?  ?   ?   ?   ?   ? ?Emotional Assessment ?Appearance:: Appears stated age ?Attitude/Demeanor/Rapport: Engaged, Gracious ?Affect (typically observed): Accepting, Appropriate, Calm, Pleasant ?Orientation: : Oriented to Self, Oriented to Place, Oriented to  Time, Oriented to Situation ?Alcohol / Substance Use: Not Applicable ?Psych Involvement: No (comment) ? ?Admission diagnosis:  Chest wall pain [R07.89] ?CAP (community acquired pneumonia) [J18.9] ?Multiple falls [R29.6] ?Closed fracture of one rib of left side with routine healing, subsequent encounter [S22.32XD] ?Community acquired pneumonia, unspecified laterality [J18.9] ?Patient Active Problem List  ? Diagnosis Date Noted  ? CAP (community acquired pneumonia) 10/28/2021  ? Fall 10/28/2021  ? Left rib fracture 10/28/2021  ? Elevated troponin 10/28/2021  ? COPD (chronic obstructive pulmonary disease) ([place]) 10/28/2021  ? CAD (coronary artery disease) 10/28/2021  ? PAF (paroxysmal atrial fibrillation) ([place]) 10/28/2021  ?  Acute on chronic respiratory failure with hypoxia ([place]) 10/28/2021  ? Chronic pain 10/28/2021  ? Depression with anxiety 10/28/2021  ? Mechanical breakdown of spinal infusion catheter 10/18/2021  ? Other specified health status 10/17/2021  ? Chronic neurogenic pain 10/17/2021  ? Cardiac dysrhythmia  09/19/2021  ? Cervicalgia 09/19/2021  ? Chronic shoulder pain (Bilateral) (R>L) 09/19/2021  ? Radicular pain of shoulder 09/19/2021  ? Occipital neuralgia (Right) 09/19/2021  ? Cervico-occipital neuralgia (Right) 09/19/2021  ? Cervical facet hypertrophy (Multilevel) (Bilateral) 09/19/2021  ? Cervical facet pain 09/19/2021  ? Painful cervical range of motion 09/19/2021  ? Pain in right knee 04/24/2021  ? DDD (degenerative disc disease), lumbosacral 04/24/2021  ? Abnormal MRI, lumbar spine (11/01/2019) 04/24/2021  ? 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 Lumbar facet arthropathy (Multilevel) (Bilateral) 04/24/2021  ? Lumbosacral facet syndrome (Bilateral) 04/24/2021  ? Lumbar foraminal narrowing (Multilevel) (Bilateral) 04/24/2021  ? Retinal tear, right 02/22/2021  ? Vitreous hemorrhage of right eye ([place]) 02/22/2021  ? Chronic coronary artery disease 02/07/2021  ? Depressive disorder 02/07/2021  ? Solitary pulmonary nodule 02/07/2021  ? Adenomatous colon polyp 02/07/2021  ? Blepharitis 02/07/2021  ? Blood in urine 02/07/2021  ? Calculus of kidney 02/07/2021  ? Chronic prostatitis 02/07/2021  ? Encounter for screening for malignant neoplasm of respiratory organs 02/07/2021  ? Flatulence, eructation and gas pain 02/07/2021  ? Hemangioma 02/07/2021  ? Hyperlipidemia 02/07/2021  ? Irritable bowel syndrome 02/07/2021  ? Chronic low back pain (1ry area of Pain) (Bilateral) (R>L) w/ sciatica (Bilateral) 02/07/2021  ? Chronic ankle pain (Bilateral) 02/07/2021  ? Pain in joint, ankle and foot 02/07/2021  ? Sebaceous cyst 02/07/2021  ? Tobacco dependence syndrome 02/07/2021  ? Pharmacologic therapy 02/07/2021  ? Disorder of skeletal system 02/07/2021  ? Problems influencing health status 02/07/2021  ? Lumbosacral radiculopathy at S1 (Bilateral) (R>L) 02/07/2021  ? Chronic feet pain (3ry area of Pain) (Bilateral) (R>L) 02/07/2021  ? Chronic knee pain (4th area of Pain) (Bilateral)  (R>L) 02/07/2021  ? 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 Lower extremity weakness (Bilateral) 02/07/2021  ? Chronic neuropathic pain 02/07/2021  ? 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  ? Nonfamilial nocturnal leg cramps (Bilateral) 02/07/2021  ? Restless leg syndrome 02/07/2021  ? Malfunction of intrathecal infusion pump 02/07/2021  ? End of battery life of intrathecal infusion pump 02/07/2021  ? Chronic systolic CHF (congestive heart failure) ([place]) 08/05/2019  ? Acquired hypothyroidism 10/04/2018  ? B12 deficiency 08/26/2018  ? Chronic obstructive pulmonary disease ([place]) 08/26/2018  ? Polyneuropathy associated with underlying disease ([place]) 08/26/2018  ? Chronic venous insufficiency 03/24/2018  ? GERD (gastroesophageal reflux disease) 11/30/2017  ? Senile purpura ([place]) 06/24/2017  ? Stasis dermatitis of both legs 01/24/2017  ? Chronic lower extremity pain (2ry area of Pain) (Bilateral) (R>L) 01/24/2017  ? Hx of deep venous thrombosis 11/22/2015  ? Paroxysmal atrial fibrillation ([place]) 11/22/2015  ? 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 Type 2 diabetes mellitus with complication, without long-term current use of insulin ([place]) 07/05/2015  ? Encounter for monitoring opioid maintenance therapy 06/29/2015  ? Presence of intrathecal pump 04/15/2014  ? Hypertension 08/03/2012  ? Chronic, continuous use of opioids 04/21/2012  ? Smoking 03/30/2012  ? Anxiety 02/18/2012  ? Chronic pain syndrome 02/18/2012  ? Lumbar post-laminectomy syndrome 02/18/2012  ? 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 Type 2 diabetes mellitus ([place]) 07/23/1999  ? ?PCP:  Caryl Comes,  Tonette Bihari, MD ?Pharmacy:   ?[id number] [address] ?[place] ?[place] [address] ?Phone: [phone] Fax: [phone] ? ? ? ? ?Social Determinants of Health (SDOH) Interventions ?  ? ?Readmission Risk  Interventions ? ?  10/29/2021  ? 10:19 AM  ?Readmission Risk Prevention Plan  ?Transportation Screening Complete  ?PCP or Specialist Appt within 3-5 Days Complete  ?Social Work Consult for [place] Planning/Counseli

## 2021-10-29 NOTE — Assessment & Plan Note (Addendum)
Could be secondary to left-sided rib fractures.  No evidence of ACS ?Patient reports musculoskeletal chest pain. ?

## 2021-10-29 NOTE — Assessment & Plan Note (Addendum)
Secondary to fall.  Adequate pain control. ?

## 2021-10-29 NOTE — Evaluation (Signed)
Physical Therapy Evaluation ?Patient Details ?Name: [name] ?MRN: [id number] ?DOB: 06-20-1946 ?Today's Date: 10/29/2021 ? ?History of Present Illness ? Pt admitted for CAP with complaints of fall at home with acute L 5/6th rib fx. Other history includes COPD, HTN, HLD, CAD, and DM.  ?Clinical Impression ? Pt is a pleasant 76 year old male who was admitted for CAP and L 5/6th rib fx. Pt performs bed mobility with min assist, transfers with cga, and ambulation with cga and RW. All mobility performed on 3.5L of O2 with O2 sats at 91% with exertion. Pt demonstrates deficits with strength/mobility/endurance. Discussed fall prevention and mobility to prevent deconditioning. Pt is currently declining further PT services. Would benefit from skilled PT to address above deficits and promote optimal return to PLOF. Recommend transition to [place] upon discharge from acute hospitalization. ? ?   ? ?Recommendations for follow up therapy are one component of a multi-disciplinary discharge planning process, led by the attending physician.  Recommendations may be updated based on patient status, additional functional criteria and insurance authorization. ? ?Follow Up Recommendations Home health PT ? ?  ?Assistance Recommended at Discharge Intermittent Supervision/Assistance  ?Patient can return home with the following ? A little help with walking and/or transfers;A little help with bathing/dressing/bathroom ? ?  ?Equipment Recommendations BSC/3in1  ?Recommendations for Other Services ?    ?  ?Functional Status Assessment Patient has had a recent decline in their functional status and demonstrates the ability to make significant improvements in function in a reasonable and predictable amount of time.  ? ?  ?Precautions / Restrictions Precautions ?Precautions: Fall ?Restrictions ?Weight Bearing Restrictions: No  ? ?  ? ?Mobility ? Bed Mobility ?Overal bed mobility: Needs Assistance ?Bed Mobility: Supine to Sit ?  ?  ?Supine to sit:  Min assist ?  ?  ?General bed mobility comments: needs slight assist with B LEs due to pain. Once seated at EOB, upright posture noted ?  ? ?Transfers ?Overall transfer level: Needs assistance ?Equipment used: Rolling walker (2 wheels) ?Transfers: Sit to/from Stand ?Sit to Stand: Min guard ?  ?  ?  ?  ?  ?General transfer comment: cues for hand placement. Once standing, RW used ?  ? ?Ambulation/Gait ?Ambulation/Gait assistance: Min guard ?Gait Distance (Feet): 50 Feet ?Assistive device: Rolling walker (2 wheels) ?Gait Pattern/deviations: Step-through pattern ?  ?  ?  ?General Gait Details: ambulated in room with RW. Pt reports 9/10 rib pain with mobility. All mobility performed on 3.5L of O2 with sats at 91% with exertion. ? ?Stairs ?  ?  ?  ?  ?  ? ?Wheelchair Mobility ?  ? ?Modified Rankin (Stroke Patients Only) ?  ? ?  ? ?Balance Overall balance assessment: History of Falls, Needs assistance ?Sitting-balance support: Feet supported ?Sitting balance-Leahy Scale: Good ?  ?  ?Standing balance support: Bilateral upper extremity supported ?Standing balance-Leahy Scale: Fair ?  ?  ?  ?  ?  ?  ?  ?  ?  ?  ?  ?  ?   ? ? ? ?Pertinent Vitals/Pain Pain Assessment ?Pain Assessment: 0-10 ?Pain Score: 8  ?Pain Location: L ribs ?Pain Descriptors / Indicators: Grimacing, Guarding, Jabbing ?Pain Intervention(s): Limited activity within patient's tolerance, Repositioned, Patient requesting pain meds-RN notified  ? ? ?Home Living Family/patient expects to be discharged to:: Private residence ?Living Arrangements: Spouse/significant other;Children (daughter is currently home from college) ?Available Help at Discharge: Family ?Type of Home: House ?Home Access: Stairs to enter ?Entrance  Stairs-Rails: Can reach both ?Entrance Stairs-Number of Steps: 4 ?  ?Home Layout: Able to live on main level with bedroom/bathroom ?Home Equipment: Conservation officer, nature (2 wheels);Wheelchair - manual ?   ?  ?Prior Function Prior Level of Function :  Independent/Modified Independent;History of Falls (last six months) ?  ?  ?  ?  ?  ?  ?Mobility Comments: uses RW majority of time, was previously on home O2 ?ADLs Comments: reports history of falls when ambulating at night ?  ? ? ?Hand Dominance  ?   ? ?  ?Extremity/Trunk Assessment  ? Upper Extremity Assessment ?Upper Extremity Assessment: Overall WFL for tasks assessed ?  ? ?Lower Extremity Assessment ?Lower Extremity Assessment: Overall WFL for tasks assessed ?  ? ?   ?Communication  ? Communication: No difficulties  ?Cognition Arousal/Alertness: Awake/alert ?Behavior During Therapy: [facility] for tasks assessed/performed ?Overall Cognitive Status: Within Functional Limits for tasks assessed ?  ?  ?  ?  ?  ?  ?  ?  ?  ?  ?  ?  ?  ?  ?  ?  ?General Comments: initially sleepy during session, however more alert once performing mobility. Reports increased pain to 9/10 with mobility. RN in room at end of session to admin meds ?  ?  ? ?  ?General Comments   ? ?  ?Exercises Other Exercises ?Other Exercises: ambulated to bathroom with cga and RW. UPright posture noted, however reports increased pain with mobility.  ? ?Assessment/Plan  ?  ?PT Assessment Patient needs continued PT services  ?PT Problem List Decreased strength;Decreased activity tolerance;Decreased balance;Decreased mobility;Cardiopulmonary status limiting activity;Pain ? ?   ?  ?PT Treatment Interventions DME instruction;Gait training;Therapeutic exercise;Balance training   ? ?PT Goals (Current goals can be found in the Care Plan section)  ?Acute Rehab PT Goals ?Patient Stated Goal: to go home ?PT Goal Formulation: With patient ?Time For Goal Achievement: 11/12/21 ?Potential to Achieve Goals: Good ? ?  ?Frequency Min 2X/week ?  ? ? ?Co-evaluation   ?  ?  ?  ?  ? ? ?  ?AM-PAC PT "6 Clicks" Mobility  ?Outcome Measure Help needed turning from your back to your side while in a flat bed without using bedrails?: A Little ?Help needed moving from lying on your back to  sitting on the side of a flat bed without using bedrails?: A Little ?Help needed moving to and from a bed to a chair (including a wheelchair)?: A Little ?Help needed standing up from a chair using your arms (e.g., wheelchair or bedside chair)?: A Little ?Help needed to walk in hospital room?: A Little ?Help needed climbing 3-5 steps with a railing? : A Lot ?6 Click Score: 17 ? ?  ?End of Session Equipment Utilized During Treatment: Gait belt;Oxygen ?Activity Tolerance: Patient tolerated treatment well ?Patient left: in bed;with bed alarm set ?Nurse Communication: Mobility status ?PT Visit Diagnosis: Muscle weakness (generalized) (M62.81);Difficulty in walking, not elsewhere classified (R26.2);Unsteadiness on feet (R26.81) ?  ? ?Time: 2297-9892 ?PT Time Calculation (min) (ACUTE ONLY): 23 min ? ? ?Charges:   PT Evaluation ?$PT Eval Low Complexity: 1 Low ?PT Treatments ?$Therapeutic Activity: 8-22 mins ?  ?   ? ? ?Greggory Stallion, PT, DPT, GCS ?[phone] ? ? ?[name] ?10/29/2021, 12:56 PM ? ?

## 2021-10-29 NOTE — Assessment & Plan Note (Addendum)
Stable.  No evidence of ACS.  Continue home medications ?

## 2021-10-29 NOTE — Assessment & Plan Note (Addendum)
Blood pressure remains controlled. 

## 2021-10-29 NOTE — Assessment & Plan Note (Signed)
Secondary to fall. ?

## 2021-10-29 NOTE — Assessment & Plan Note (Signed)
Patient meets criteria for severe sepsis secondary to tachypnea, tachycardia and leukocytosis secondary to pneumonia source along causing acute on chronic respiratory failure.  Patient treated with fluids and antibiotics. ?

## 2021-10-29 NOTE — Assessment & Plan Note (Signed)
Continue statin. 

## 2021-10-29 NOTE — Hospital Course (Addendum)
This 76 year old male with past medical history significant of chronic respiratory failure on 2.5 L of O2 nasal cannula from COPD, hypertension, hypothyroidism, obstructive sleep apnea, systolic CHF with ejection fraction 35% and PVD ,  paroxysmal A-fib who presented to the emergency room on 10/28/21 complaining of cough and shortness of breath as well as a recent fall 2 days prior where he had hit the left side of his chest on the foot end of his bed.  In ED [name] found to have 6 lateral rib fractures on left side as well as hypoxia on 2.5 L, requiring 3 to 4 L, and more concerning, sepsis secondary to community-acquired pneumonia.  [name] was brought in under the hospitalist service and started on fluids, antibiotics and supplemental oxygen. ?[name] was admitted for further evaluation.  [name] was continued on IV antibiotics, continued on supplemental oxygen and continued on adequate pain control.  [name] has significantly improved,  feels much better and wants to be discharged.  [name] is being discharged home. Home health services been arranged. ?

## 2021-10-29 NOTE — Assessment & Plan Note (Addendum)
Patient and family reports recurrent falls, approximately several / year. We had extensive discussion with patient and he is willing to consider home physical therapy.  In the meantime, we will work on pain control following this rib fracture with lidocaine patches and Toradol.  ?Patient reports pain is reasonably controlled. ?

## 2021-10-29 NOTE — Assessment & Plan Note (Signed)
Continue Synthroid °

## 2021-10-29 NOTE — Assessment & Plan Note (Addendum)
Meets criteria with BMI greater than 25. ?Diet and exercise discussed in detail. ?

## 2021-10-29 NOTE — Assessment & Plan Note (Addendum)
Heart rate well controlled.  Not on anticoagulation due to recurrent falls.  ?

## 2021-10-30 DIAGNOSIS — S2232XD Fracture of one rib, left side, subsequent encounter for fracture with routine healing: Secondary | ICD-10-CM

## 2021-10-30 DIAGNOSIS — J441 Chronic obstructive pulmonary disease with (acute) exacerbation: Secondary | ICD-10-CM | POA: Diagnosis not present

## 2021-10-30 DIAGNOSIS — I5022 Chronic systolic (congestive) heart failure: Secondary | ICD-10-CM | POA: Diagnosis not present

## 2021-10-30 DIAGNOSIS — J9621 Acute and chronic respiratory failure with hypoxia: Secondary | ICD-10-CM | POA: Diagnosis not present

## 2021-10-30 LAB — CBC
HCT: 40.2 % (ref 39.0–52.0)
Hemoglobin: 12.7 g/dL — ABNORMAL LOW (ref 13.0–17.0)
MCH: 28.3 pg (ref 26.0–34.0)
MCHC: 31.6 g/dL (ref 30.0–36.0)
MCV: 89.7 fL (ref 80.0–100.0)
Platelets: 216 10*3/uL (ref 150–400)
RBC: 4.48 MIL/uL (ref 4.22–5.81)
RDW: 14.4 % (ref 11.5–15.5)
WBC: 10.8 10*3/uL — ABNORMAL HIGH (ref 4.0–10.5)
nRBC: 0 % (ref 0.0–0.2)

## 2021-10-30 LAB — BASIC METABOLIC PANEL
Anion gap: 5 (ref 5–15)
BUN: 17 mg/dL (ref 8–23)
CO2: 30 mmol/L (ref 22–32)
Calcium: 8.2 mg/dL — ABNORMAL LOW (ref 8.9–10.3)
Chloride: 104 mmol/L (ref 98–111)
Creatinine, Ser: 0.87 mg/dL (ref 0.61–1.24)
GFR, Estimated: >60 mL/min (ref >60)
Glucose, Bld: 105 mg/dL — ABNORMAL HIGH (ref 70–99)
Potassium: 4 mmol/L (ref 3.5–5.1)
Sodium: 139 mmol/L (ref 135–145)

## 2021-10-30 LAB — LEGIONELLA PNEUMOPHILA SEROGP 1 UR AG: L. pneumophila Serogp 1 Ur Ag: NEGATIVE

## 2021-10-30 LAB — GLUCOSE, CAPILLARY: Glucose-Capillary: 90 mg/dL (ref 70–99)

## 2021-10-30 LAB — MAGNESIUM: Magnesium: 2.3 mg/dL (ref 1.7–2.4)

## 2021-10-30 NOTE — Plan of Care (Signed)
?  Problem: Clinical Measurements: ?Goal: Ability to maintain clinical measurements within normal limits will improve ?10/30/2021 0543 by Ribay-Pagdatoon, Audrea Muscat, RN ?Outcome: Progressing ?10/30/2021 0540 by Ribay-Pagdatoon, Audrea Muscat, RN ?Outcome: Progressing ?Goal: Will remain free from infection ?10/30/2021 0543 by Ribay-Pagdatoon, Audrea Muscat, RN ?Outcome: Progressing ?10/30/2021 0540 by Ribay-Pagdatoon, Audrea Muscat, RN ?Outcome: Progressing ?Goal: Diagnostic test results will improve ?10/30/2021 0543 by Ribay-Pagdatoon, Audrea Muscat, RN ?Outcome: Progressing ?10/30/2021 0540 by Ribay-Pagdatoon, Audrea Muscat, RN ?Outcome: Progressing ?Goal: Respiratory complications will improve ?10/30/2021 0543 by Ribay-Pagdatoon, Audrea Muscat, RN ?Outcome: Progressing ?10/30/2021 0540 by Ribay-Pagdatoon, Audrea Muscat, RN ?Outcome: Progressing ?Goal: Cardiovascular complication will be avoided ?10/30/2021 0543 by Ribay-Pagdatoon, Audrea Muscat, RN ?Outcome: Progressing ?10/30/2021 0540 by Ribay-Pagdatoon, Audrea Muscat, RN ?Outcome: Progressing ?  ?Problem: Pain Managment: ?Goal: General experience of comfort will improve ?10/30/2021 0543 by Ribay-Pagdatoon, Audrea Muscat, RN ?Outcome: Progressing ?10/30/2021 0540 by Ribay-Pagdatoon, Audrea Muscat, RN ?Outcome: Progressing ?  ?Pt is involved in and agrees with the plan of care. V/S stable. ON oxygen at 2lpm/[place]. Complained of pain on Left rib cage; Dilaudid and scheduled Toradol with some relief.  ?

## 2021-10-30 NOTE — TOC Progression Note (Signed)
Transition of Care (TOC) - Progression Note  ? ? ?Patient Details  ?Name: [name] ?MRN: [id number] ?Date of Birth: 05/22/1946 ? ?Transition of Care (TOC) CM/SW Contact  ?Laurena Slimmer, RN ?Phone Number: ?10/30/2021, 2:47 PM ? ?Clinical Narrative:    ?Patient refuses HHPT.  ? ? ?Expected Discharge Plan: Home/Self Care ?Barriers to Discharge: Continued Medical Work up ? ?Expected Discharge Plan and Services ?Expected Discharge Plan: Home/Self Care ?  ?  ?Post Acute Care Choice: NA ?Living arrangements for the past 2 months: [place] ?                ?  ?  ?  ?  ?  ?  ?  ?  ?  ?  ? ? ?Social Determinants of Health (SDOH) Interventions ?  ? ?Readmission Risk Interventions ? ?  10/29/2021  ? 10:19 AM  ?Readmission Risk Prevention Plan  ?Transportation Screening Complete  ?PCP or Specialist Appt within 3-5 Days Complete  ?Social Work Consult for [place] Planning/Counseling Complete  ?Palliative Care Screening Not Applicable  ?Medication Review Press photographer) Complete  ? ? ?

## 2021-10-30 NOTE — Progress Notes (Signed)
[facility] Progress Note ? ?Patient: [name]    PPI:[id number]  DOA: 10/28/2021    ?Date of Service: the patient was seen and examined on 10/30/2021 ? ?Brief hospital course: ?76 year old male with past medical history of chronic respiratory failure 2.5 L nasal cannula from COPD hypertension, hypothyroidism, obstructive sleep apnea, systolic CHF with ejection fraction 35% and PVD was paroxysmal A-fib who presented to the emergency room on 4/9 complaining of cough and shortness of breath as well as a recent fall 2 days prior where he had hit the left side of his chest on the foot of his bed.  Emergency room, patient found to have 6 lateral rib fracture left side as well as hypoxia on 2.5 L, requiring 3 to 4 L, and more concerning, sepsis secondary to community-acquired pneumonia.  Patient was brought in under the hospitalist service and started on fluids, antibiotics and supplemental oxygen. ? ?Assessment and Plan: ?Assessment and Plan: ?Acute on chronic respiratory failure with hypoxia ([place]) ?Baseline of 2.5 L, came in on 4 L.  No evidence of pneumonia.  I suspect that between COPD exacerbation as well as difficulty taking in deep inspiration causing this.  No evidence of CHF.  Would plan to continue nebulizers, oxygen support and encouragement of deep inspiration with incentive spirometry.  Have been able to wean down to 2 L. ? ?COPD (chronic obstructive pulmonary disease) ([place]) ?Continue nebulizers and supplemental oxygen.  No evidence of infection given normal procalcitonin.  Suspect leukocytosis was more reactive and has since normalized.  Afebrile. ? ?Left rib fracture ?Secondary to fall.  We will try some better pain control by adding a second Lidoderm patch and scheduled Toradol for today.  If this works, will change to as needed. ? ?Fall ?Patient and patient's daughter report a number of falls, approximately several year.  Had extensive discussion with patient and he is willing to think about home  physical therapy.  In the meantime, we will work on pain control following this rib fracture with lidocaine patches and Toradol.  Seems to be getting better. ? ?Chronic systolic CHF (congestive heart failure) ([place]) ?Ejection fraction of 35 to 40%.  No valvular stenosis.  Currently euvolemic to slightly dry. ? ?Elevated troponin ?Stable, likely secondary to trauma with some respiratory component.  No evidence of ACS. ? ?Hypertension ?Blood pressure stable during this hospitalization. ? ?CAD (coronary artery disease) ?Stable, see above in regards to troponins ? ?PAF (paroxysmal atrial fibrillation) ([place]) ?Stable.  Heart rate controlled.  Not on anticoagulation. ? ?Hyperlipidemia ?Continue statin ? ?Acquired hypothyroidism ?Continue Synthroid ? ?Overweight (BMI 25.0-29.9) ?Meets criteria with BMI greater than 25 ? ? ? ? ? ? ?Body mass index is 27.36 kg/m?.  ?  ?   ? ?Consultants: ?None ? ?Procedures: ?None ? ?Antimicrobials: ?IV Rocephin and Zithromax 4/9 only ? ?Code Status: Full code ? ? ?Subjective: Sleeping more comfortably.  Less left-sided chest wall pain. ? ?Objective: ?Vital signs were reviewed and unremarkable. ?Vitals:  ? 10/30/21 0857 10/30/21 1453  ?BP: (!) 167/80 122/62  ?Pulse: (!) 40 (!) 41  ?Resp: 16 16  ?Temp: 97.9 ?F (36.6 ?C) 98.3 ?F (36.8 ?C)  ?SpO2: 100% 95%  ? ? ?Intake/Output Summary (Last 24 hours) at 10/30/2021 1754 ?Last data filed at 10/30/2021 1300 ?Gross per 24 hour  ?Intake 240 ml  ?Output --  ?Net 240 ml  ? ? ?Filed Weights  ? 10/28/21 1100 10/30/21 0500  ?Weight: 96.8 kg 99.3 kg  ? ?Body mass index is  27.36 kg/m?. ? ?Exam: ? ?General: Resting comfortably ?HEENT: Normocephalic and atraumatic, mucous membranes are moist ?Cardiovascular: Regular rate and rhythm, S1-S2 ?Respiratory: Better inspiratory effort, less pain ?Abdomen: Soft, nontender, nondistended, positive bowel sounds ?Musculoskeletal: No clubbing or cyanosis or edema ?Skin: No skin breaks, tears or lesions ?Psychiatry:  Appropriate, no evidence of psychoses ?Neurology: No focal deficits ? ?Data Reviewed: ?White blood cell count normalized ? ?Disposition:  ?Status is: Inpatient ?Remains inpatient appropriate because: Better pain control ?  ? ?Anticipated discharge date: 4/12 ? ? ?Family Communication: Left message for daughter ?DVT Prophylaxis: ?enoxaparin (LOVENOX) injection 40 mg Start: 10/28/21 2200 ? ? ? ?Author: ?Annita Brod ,MD ?10/30/2021 5:54 PM ? ?To reach On-call, see care teams to locate the attending and reach out via [email or web address]. ?Between 7PM-7AM, please contact night-coverage ?If you still have difficulty reaching the attending provider, please page the [facility] (Director on Call) for [facility] on amion for assistance. ? ?

## 2021-10-30 NOTE — Progress Notes (Signed)
Pt scored a 4 on the RT Protocol assessment. He is clear and diminished. The nebulizer treatments will be changed to prn. ?

## 2021-10-31 ENCOUNTER — Encounter: Payer: Self-pay | Admitting: Internal Medicine

## 2021-10-31 DIAGNOSIS — R001 Bradycardia, unspecified: Secondary | ICD-10-CM

## 2021-10-31 DIAGNOSIS — I700 Atherosclerosis of aorta: Secondary | ICD-10-CM | POA: Insufficient documentation

## 2021-10-31 DIAGNOSIS — S2232XA Fracture of one rib, left side, initial encounter for closed fracture: Secondary | ICD-10-CM | POA: Diagnosis not present

## 2021-10-31 LAB — CULTURE, RESPIRATORY W GRAM STAIN: Culture: NORMAL

## 2021-10-31 LAB — GLUCOSE, CAPILLARY: Glucose-Capillary: 95 mg/dL (ref 70–99)

## 2021-10-31 MED ORDER — BISACODYL 10 MG RE SUPP
10.0000 mg | Freq: Once | RECTAL | Status: AC
  Administered 2021-10-31: 10 mg via RECTAL
  Filled 2021-10-31 (×2): qty 1

## 2021-10-31 NOTE — Progress Notes (Signed)
MD notified of low HR.  EKG obtained and MD to round.  Patient states has history of afib.  Patient states feels fine, denies issues. ?

## 2021-10-31 NOTE — Progress Notes (Signed)
?Progress Note ? ? ?Patient: [name]:[id number] DOB: 04-02-1946 DOA: 10/28/2021     3 ? ?DOS: the patient was seen and examined on 10/31/2021 ?  ?Brief hospital course: ?This 76 year old male with past medical history significant of chronic respiratory failure on 2.5 L nasal cannula from COPD, hypertension, hypothyroidism, obstructive sleep apnea, systolic CHF with ejection fraction 35% and PVD ,  paroxysmal A-fib who presented to the emergency room on 4/9 complaining of cough and shortness of breath as well as a recent fall 2 days prior where he had hit the left side of his chest on the foot end of his bed.  In ED patient found to have 6 lateral rib fractures on left side as well as hypoxia on 2.5 L, requiring 3 to 4 L, and more concerning, sepsis secondary to community-acquired pneumonia.  Patient was brought in under the hospitalist service and started on fluids, antibiotics and supplemental oxygen. ? ?Assessment and Plan: ?Acute on chronic respiratory failure with hypoxia (HCC) ?Patient uses 2.5 L of supplemental oxygen at baseline.  He presented with hypoxia requiring 4 L. ?Chest x-ray: no evidence of pneumonia.  Suspect this could be due to mild COPD exacerbation as well as difficulty in taking deep breath because of left-sided rib fractures.  No evidence of CHF.  Continue nebulizers, oxygen support and encouragement of deep inspiration with incentive spirometry.  Have been able to wean down to 2 L. ? ?COPD (chronic obstructive pulmonary disease) ([place]) ?Continue supplemental oxygen.  Continue nebulized bronchodilators.  No evidence of infection given normal procalcitonin.  Suspect leukocytosis was more reactive and has since normalized.  Afebrile. ? ?Left rib fracture ?Secondary to fall.  We will try some better pain control by adding a second Lidoderm patch and scheduled Toradol for today.  If this works, will change to as needed. ? ?Fall ?Patient and family reports recurrent falls, approximately  several / year. We had extensive discussion with patient and he is willing to consider home physical therapy.  In the meantime, we will work on pain control following this rib fracture with lidocaine patches and Toradol.  ?Patient reports pain is reasonably controlled. ? ?Chronic systolic CHF (congestive heart failure) ([place]) ?Echo: LVEF 35 to 40%.  No valvular stenosis.   ?Currently appears euvolemic, not  fluid overloaded. ? ?Elevated troponin ?Could be secondary to left-sided rib fractures.  No evidence of ACS ?Patient reports musculoskeletal chest pain. ? ?Hypertension ?Blood pressure remains controlled. ? ?CAD (coronary artery disease) ?Stable.  No evidence of ACS.  Continue home medications ? ?PAF (paroxysmal atrial fibrillation) ([place]) ?Heart rate well controlled.  Not on anticoagulation due to recurrent falls.  ? ?Hyperlipidemia ?Continue statin ? ?Acquired hypothyroidism ?Continue Synthroid ? ?Overweight (BMI 25.0-29.9) ?Meets criteria with BMI greater than 25. ?Diet and exercise discussed in detail. ? ? ? ?Subjective: Patient was seen and examined at bedside.  Overnight events noted.   ?Patient reports feeling improved.  Patient still reports having left-sided chest tenderness.   ?He still hurts when he takes deep breaths. He denies any palpitations. ? ?Physical Exam: ?Vitals:  ? 10/30/21 2002 10/31/21 0508 10/31/21 0800 10/31/21 1050  ?BP:  (!) 117/52 (!) 162/72   ?Pulse:  76 (!) 50   ?Resp:  18    ?Temp:  98.2 ?F (36.8 ?C) 98.2 ?F (36.8 ?C)   ?TempSrc:      ?SpO2: 96% 94% 95% 92%  ?Weight:      ?Height:      ? ?General exam:  Appears comfortable, not in any acute distress, deconditioned ?Respiratory system: CTA bilaterally, no wheezing, no crackles, tenderness noted on the left sided ribs ?Cardiovascular system: S1-S2 heard, regular rate and rhythm, no murmur. ?Gastrointestinal system: Abdomen is soft, non tender, non distended, BS+ ?Central nervous system: Alert, oriented x 2, no focal neurological  deficits. ?Extremities: No edema, no cyanosis, no clubbing. ?Psychiatry: Mood, insight, judgment normal. ? ? ? ?Data Reviewed: ?I have Reviewed nursing notes, Vitals, and Lab results since pt's last encounter. Pertinent lab results CBC, BMP, mag, Phos ?I have ordered test including CBC, BMP ?I have reviewed the last note from hospitalist,  ?I have discussed pt's care plan and test results with patient.  ? ?Family Communication: No family at bedside ? ?Disposition: ?Status is: Inpatient ?Remains inpatient appropriate because: Admitted s/p fall left-sided rib fracture still remains in a lot of pain. ? ?Anticipated discharge home 11/01/2021. ? ? ? Planned Discharge Destination: Home ? ? ? ?Time spent: 50  minutes ? ?Author: ?Shawna Clamp, MD ?10/31/2021 11:50 AM ? ?For on call review [email or web address].  ?

## 2021-10-31 NOTE — Progress Notes (Signed)
Physical Therapy Treatment ?Patient Details ?Name: [name] ?MRN: [id number] ?DOB: 05-May-1946 ?Today's Date: 10/31/2021 ? ? ?History of Present Illness Pt admitted for CAP with complaints of fall at home with acute L 5/6th rib fx. Other history includes COPD, HTN, HLD, CAD, and DM. ? ?  ?PT Comments  ? ? Patient received sitting up in recliner. Agrees to PT session. He is hopeful to go home today. Reports he is feeling good, no pain or sob reported during session. Received on room air, sats at 92%, increased to 97% after ambulating. He will continue to benefit from skilled PT while here to improve strength and safety with mobility.  ?   ?Recommendations for follow up therapy are one component of a multi-disciplinary discharge planning process, led by the attending physician.  Recommendations may be updated based on patient status, additional functional criteria and insurance authorization. ? ?Follow Up Recommendations ? Home health PT ?  ?  ?Assistance Recommended at Discharge PRN  ?Patient can return home with the following A little help with walking and/or transfers;A little help with bathing/dressing/bathroom ?  ?Equipment Recommendations ? None recommended by PT  ?  ?Recommendations for Other Services   ? ? ?  ?Precautions / Restrictions Precautions ?Precautions: Fall ?Restrictions ?Weight Bearing Restrictions: No  ?  ? ?Mobility ? Bed Mobility ?  ?  ?  ?  ?  ?  ?  ?General bed mobility comments: patient received in recliner, dressed ?  ? ?Transfers ?Overall transfer level: Modified independent ?Equipment used: Rolling walker (2 wheels) ?Transfers: Sit to/from Stand ?Sit to Stand: Modified independent (Device/Increase time) ?  ?  ?  ?  ?  ?  ?  ? ?Ambulation/Gait ?Ambulation/Gait assistance: Min guard ?Gait Distance (Feet): 175 Feet ?Assistive device: Rolling walker (2 wheels) ?Gait Pattern/deviations: Step-through pattern, Decreased step length - right, Decreased step length - left, Decreased stride length,  Decreased dorsiflexion - right, Decreased dorsiflexion - left ?Gait velocity: slightly decreased ?  ?  ?General Gait Details: Patient ambulating well this session. No pain reported, no SOB ambulating on room air. Sats at 97% after ambulation. ? ? ?Stairs ?  ?  ?  ?  ?  ? ? ?Wheelchair Mobility ?  ? ?Modified Rankin (Stroke Patients Only) ?  ? ? ?  ?Balance Overall balance assessment: History of Falls, Modified Independent ?Sitting-balance support: Feet supported ?Sitting balance-Leahy Scale: Normal ?  ?  ?Standing balance support: Bilateral upper extremity supported, During functional activity, Reliant on assistive device for balance ?Standing balance-Leahy Scale: Fair ?  ?  ?  ?  ?  ?  ?  ?  ?  ?  ?  ?  ?  ? ?  ?Cognition Arousal/Alertness: Awake/alert ?Behavior During Therapy: [facility] for tasks assessed/performed ?Overall Cognitive Status: Within Functional Limits for tasks assessed ?  ?  ?  ?  ?  ?  ?  ?  ?  ?  ?  ?  ?  ?  ?  ?  ?  ?  ?  ? ?  ?Exercises   ? ?  ?General Comments   ?  ?  ? ?Pertinent Vitals/Pain Pain Assessment ?Pain Assessment: No/denies pain  ? ? ?Home Living   ?  ?  ?  ?  ?  ?  ?  ?  ?  ?   ?  ?Prior Function    ?  ?  ?   ? ?PT Goals (current goals can now be found in  the care plan section) Acute Rehab PT Goals ?Patient Stated Goal: to go home ?PT Goal Formulation: With patient ?Time For Goal Achievement: 11/12/21 ?Potential to Achieve Goals: Good ?Additional Goals ?Additional Goal #1: Pt will be able to perform bed mobility/transfers with mod I and safe technique ?Progress towards PT goals: Progressing toward goals ? ?  ?Frequency ? ? ? Min 2X/week ? ? ? ?  ?PT Plan Current plan remains appropriate  ? ? ?Co-evaluation   ?  ?  ?  ?  ? ?  ?AM-PAC PT "6 Clicks" Mobility   ?Outcome Measure ? Help needed turning from your back to your side while in a flat bed without using bedrails?: A Little ?Help needed moving from lying on your back to sitting on the side of a flat bed without using bedrails?: A  Little ?Help needed moving to and from a bed to a chair (including a wheelchair)?: A Little ?Help needed standing up from a chair using your arms (e.g., wheelchair or bedside chair)?: A Little ?Help needed to walk in hospital room?: A Little ?Help needed climbing 3-5 steps with a railing? : A Little ?6 Click Score: 18 ? ?  ?End of Session   ?Activity Tolerance: Patient tolerated treatment well ?Patient left: in chair;with call bell/phone within reach ?Nurse Communication: Mobility status ?PT Visit Diagnosis: Muscle weakness (generalized) (M62.81) ?  ? ? ?Time: 7035-0093 ?PT Time Calculation (min) (ACUTE ONLY): 11 min ? ?Charges:  $Gait Training: 8-22 mins          ?          ? ?[employer], PT, [employer] ?10/31/21,10:56 AM ? ? ?

## 2021-10-31 NOTE — Progress Notes (Signed)
Mobility Specialist - Progress Note ? ? 10/31/21 1600  ?Mobility  ?Activity Refused mobility  ? ? ? ?Pt sitting EOB upon arrival with family at bedside. Pt motivated to ambulate but holding off d/t enema. Will attempt at another date and time. ? ?[name] ?Mobility Specialist ?10/31/21, 4:22 PM ? ? ? ? ?

## 2021-10-31 NOTE — Care Management Important Message (Signed)
Important Message ? ?Patient Details  ?Name: [name] ?MRN: [id number] ?Date of Birth: 1946/06/05 ? ? ?Medicare Important Message Given:  Yes ? ? ? ? ?Dannette Barbara ?10/31/2021, 11:19 AM ?

## 2021-11-01 ENCOUNTER — Encounter: Payer: Self-pay | Admitting: Internal Medicine

## 2021-11-01 LAB — CBC
HCT: 48.2 % (ref 39.0–52.0)
Hemoglobin: 15.4 g/dL (ref 13.0–17.0)
MCH: 28.5 pg (ref 26.0–34.0)
MCHC: 32 g/dL (ref 30.0–36.0)
MCV: 89.1 fL (ref 80.0–100.0)
Platelets: 258 10*3/uL (ref 150–400)
RBC: 5.41 MIL/uL (ref 4.22–5.81)
RDW: 14.2 % (ref 11.5–15.5)
WBC: 12.1 10*3/uL — ABNORMAL HIGH (ref 4.0–10.5)
nRBC: 0 % (ref 0.0–0.2)

## 2021-11-01 LAB — GLUCOSE, CAPILLARY: Glucose-Capillary: 110 mg/dL — ABNORMAL HIGH (ref 70–99)

## 2021-11-01 LAB — MAGNESIUM: Magnesium: 2.3 mg/dL (ref 1.7–2.4)

## 2021-11-01 LAB — PHOSPHORUS: Phosphorus: 3.7 mg/dL (ref 2.5–4.6)

## 2021-11-01 LAB — BASIC METABOLIC PANEL
Anion gap: 7 (ref 5–15)
BUN: 21 mg/dL (ref 8–23)
CO2: 29 mmol/L (ref 22–32)
Calcium: 8.8 mg/dL — ABNORMAL LOW (ref 8.9–10.3)
Chloride: 105 mmol/L (ref 98–111)
Creatinine, Ser: 0.97 mg/dL (ref 0.61–1.24)
GFR, Estimated: >60 mL/min (ref >60)
Glucose, Bld: 105 mg/dL — ABNORMAL HIGH (ref 70–99)
Potassium: 4.1 mmol/L (ref 3.5–5.1)
Sodium: 141 mmol/L (ref 135–145)

## 2021-11-01 MED ORDER — ASPIRIN 81 MG PO TBEC: 81.0000 mg | DELAYED_RELEASE_TABLET | Freq: Every day | ORAL | 11 refills | Status: AC

## 2021-11-01 NOTE — TOC Transition Note (Signed)
Transition of Care (TOC) - CM/SW Discharge Note ? ? ?Patient Details  ?Name: [name] ?MRN: [id number] ?Date of Birth: 02-10-1946 ? ?Transition of Care (TOC) CM/SW Contact:  ?Candie Chroman, LCSW ?Phone Number: ?11/01/2021, 8:32 AM ? ? ?Clinical Narrative:   Patient has orders to discharge home today. No further concerns. CSW signing off. ? ?Final next level of care: Home/Self Care ?Barriers to Discharge: Barriers Resolved ? ? ?Patient Goals and CMS Choice ?  ?  ?  ? ?Discharge Placement ?  ?           ?  ?Patient to be transferred to facility by: Wife or daughter ?  ?Patient and family notified of of transfer: 11/01/21 ? ?Discharge Plan and Services ?  ?  ?Post Acute Care Choice: NA          ?  ?  ?  ?  ?  ?  ?  ?  ?  ?  ? ?Social Determinants of Health (SDOH) Interventions ?  ? ? ?Readmission Risk Interventions ? ?  10/29/2021  ? 10:19 AM  ?Readmission Risk Prevention Plan  ?Transportation Screening Complete  ?PCP or Specialist Appt within 3-5 Days Complete  ?Social Work Consult for [place] Planning/Counseling Complete  ?Palliative Care Screening Not Applicable  ?Medication Review Press photographer) Complete  ? ? ? ? ? ?

## 2021-11-01 NOTE — Plan of Care (Signed)
?  Problem: Education: ?Goal: Knowledge of General Education information will improve ?Description: Including pain rating scale, medication(s)/side effects and non-pharmacologic Colbie Sliker measures ?Outcome: Completed/Met ?  ?Problem: Health Behavior/Discharge Planning: ?Goal: Ability to manage health-related needs will improve ?Outcome: Completed/Met ?  ?Problem: Clinical Measurements: ?Goal: Ability to maintain clinical measurements within normal limits will improve ?Outcome: Completed/Met ?Goal: Will remain free from infection ?Outcome: Completed/Met ?Goal: Diagnostic test results will improve ?Outcome: Completed/Met ?Goal: Respiratory complications will improve ?Outcome: Completed/Met ?Goal: Cardiovascular complication will be avoided ?Outcome: Completed/Met ?  ?Problem: Activity: ?Goal: Risk for activity intolerance will decrease ?Outcome: Completed/Met ?  ?Problem: Nutrition: ?Goal: Adequate nutrition will be maintained ?Outcome: Completed/Met ?  ?Problem: Coping: ?Goal: Level of anxiety will decrease ?Outcome: Completed/Met ?  ?Problem: Elimination: ?Goal: Will not experience complications related to bowel motility ?Outcome: Completed/Met ?Goal: Will not experience complications related to urinary retention ?Outcome: Completed/Met ?  ?Problem: Pain Managment: ?Goal: General experience of [name] will improve ?Outcome: Completed/Met ?  ?Problem: Safety: ?Goal: Ability to remain free from injury will improve ?Outcome: Completed/Met ?  ?Problem: Skin Integrity: ?Goal: Risk for impaired skin integrity will decrease ?Outcome: Completed/Met ?  ?

## 2021-11-01 NOTE — Discharge Instructions (Signed)
Advised to follow-up with primary care physician in 1 week. Advised to continue current medications as prescribed. 

## 2021-11-01 NOTE — Progress Notes (Signed)
Patient discharged and verbalized understanding for discharge. Removed PIV. Patient personal belongings were given to patient. Patient had no complain or questions.  ?

## 2021-11-01 NOTE — Discharge Summary (Signed)
?Physician Discharge Summary ?  ?Patient: [name] MRN: [id number] DOB: 08/18/1945  ?Admit date:     10/28/2021  ?Discharge date: 11/01/21  ?Discharge Physician: Shawna Clamp  ? ?PCP: Adin Hector, MD  ? ?Recommendations at discharge:  ?Advised to follow-up with primary care physician in 1 week. ?Advised to continue current medications as prescribed. ? ?Discharge Diagnoses: ?Active Problems: ?  Acute on chronic respiratory failure with hypoxia (HCC) ?  COPD (chronic obstructive pulmonary disease) ([place]) ?  Left rib fracture ?  Fall ?  Chronic systolic CHF (congestive heart failure) ([place]) ?  Hypertension ?  Elevated troponin ?  CAD (coronary artery disease) ?  PAF (paroxysmal atrial fibrillation) ([place]) ?  Hyperlipidemia ?  Acquired hypothyroidism ?  Overweight (BMI 25.0-29.9) ?  Hx of deep venous thrombosis ?  Smoking ?  Chronic pain ?  Depression with anxiety ? ?Resolved Problems: ?  * No resolved hospital problems. * ? ?Hospital Course: ?This 76 year old male with past medical history significant of chronic respiratory failure on 2.5 L of O2 nasal cannula from COPD, hypertension, hypothyroidism, obstructive sleep apnea, systolic CHF with ejection fraction 35% and PVD ,  paroxysmal A-fib who presented to the emergency room on 10/28/21 complaining of cough and shortness of breath as well as a recent fall 2 days prior where he had hit the left side of his chest on the foot end of his bed.  In ED patient found to have 6 lateral rib fractures on left side as well as hypoxia on 2.5 L, requiring 3 to 4 L, and more concerning, sepsis secondary to community-acquired pneumonia.  Patient was brought in under the hospitalist service and started on fluids, antibiotics and supplemental oxygen. ?Patient was admitted for further evaluation.  Patient was continued on IV antibiotics, continued on supplemental oxygen and continued on adequate pain control.  Patient has significantly improved,  feels much better and wants to be  discharged.  Patient is being discharged home. Home health services been arranged. ? ?Assessment and Plan: ?Acute on chronic respiratory failure with hypoxia (HCC) ?Patient uses 2.5 L of supplemental oxygen at baseline.  He presented with hypoxia requiring 4 L. ?Chest x-ray: no evidence of pneumonia.  Suspect this could be due to mild COPD exacerbation as well as difficulty in taking deep breath because of left-sided rib fractures.  No evidence of CHF.  Continue nebulizers, oxygen support and encouragement of deep inspiration with incentive spirometry.  Have been able to wean down to 2 L. ?Continue supplemental oxygen as prescribed ? ?COPD (chronic obstructive pulmonary disease) ([place]) ?Continue supplemental oxygen.   ?Continue nebulized bronchodilators.  ? No evidence of infection given normal procalcitonin.   ?Suspect leukocytosis was more reactive and has since normalized.  Afebrile. ? ?Left rib fracture ?Secondary to fall.  Adequate pain control. ? ?Fall ?Patient and family reports recurrent falls, approximately several / year. We had extensive discussion with patient and he is willing to consider home physical therapy.  In the meantime, we will work on pain control following this rib fracture with lidocaine patches and Toradol.  ?Patient reports pain is reasonably controlled. ? ?Chronic systolic CHF (congestive heart failure) ([place]) ?Echo: LVEF 35 to 40%.  No valvular stenosis.   ?Currently appears euvolemic, not  fluid overloaded. ? ?Elevated troponin ?Could be secondary to left-sided rib fractures.  No evidence of ACS ?Patient reports musculoskeletal chest pain. ? ?Hypertension ?Blood pressure remains controlled. ? ?CAD (coronary artery disease) ?Stable.  No evidence of ACS.  Continue home medications ? ?PAF (paroxysmal atrial fibrillation) ([place]) ?Heart rate well controlled.  Not on anticoagulation due to recurrent falls.  ? ?Hyperlipidemia ?Continue statin ? ?Acquired hypothyroidism ?Continue  Synthroid ? ?Overweight (BMI 25.0-29.9) ?Meets criteria with BMI greater than 25. ?Diet and exercise discussed in detail. ? ? ? ?Pain control - [employer] Controlled Substance Reporting System database was reviewed. and patient was instructed, not to drive, operate heavy machinery, perform activities at heights, swimming or participation in water activities or provide baby-sitting services while on Pain, Sleep and Anxiety Medications; until their outpatient Physician has advised to do so again. Also recommended to not to take more than prescribed Pain, Sleep and Anxiety Medications.  ? ?Consultants: None ? ?Procedures performed: None. ? ?Disposition: Home. ? ?Diet recommendation:  ?Discharge Diet Orders (From admission, onward)  ? ?  Start     Ordered  ? 11/01/21 0000  Diet - low sodium heart healthy       ? 11/01/21 1038  ? 11/01/21 0000  Diet Carb Modified       ? 11/01/21 1038  ? ?  ?  ? ?  ? ?Cardiac diet ? ?DISCHARGE MEDICATION: ?Allergies as of 11/01/2021   ? ?   Reactions  ? Celebrex [celecoxib] Nausea Only  ? Codeine Nausea Only  ? Penicillins Other (See Comments)  ? unknown ?Other reaction(s): Other (See Comments), Unknown ?unknown  ? ?  ? ?  ?Medication List  ?  ? ?STOP taking these medications   ? ?cloNIDine 0.1 MG tablet ?Commonly known as: CATAPRES ?  ?STIOLTO RESPIMAT IN ?  ? ?  ? ?TAKE these medications   ? ?albuterol 108 (90 Base) MCG/ACT inhaler ?Commonly known as: VENTOLIN HFA ?Inhale into the lungs every 6 (six) hours as needed for wheezing or shortness of breath. ?  ?amitriptyline 100 MG tablet ?Commonly known as: ELAVIL ?Take 100 mg by mouth at bedtime. ?  ?aspirin 81 MG EC tablet ?Take 1 tablet (81 mg total) by mouth daily. Swallow whole. ?Start taking on: November 02, 2021 ?  ?azelastine 0.1 % nasal spray ?Commonly known as: ASTELIN ?USE 1 SPRAY IN EACH NOSTRIL TWICE DAILY AS NEEDED FOR RHINITIS ?  ?bisacodyl 5 MG EC tablet ?Commonly known as: DULCOLAX ?Take 5 mg by mouth daily as needed for  moderate constipation. ?  ?DULoxetine 60 MG capsule ?Commonly known as: CYMBALTA ?TAKE ONE CAPSULE BY MOUTH ONCE EVERY DAY ?  ?HYDROmorphone 4 MG tablet ?Commonly known as: DILAUDID ?Take 1 tablet (4 mg total) by mouth every 8 (eight) hours as needed. Must last 30 days. ?  ?HYDROmorphone 4 MG tablet ?Commonly known as: DILAUDID ?Take 1 tablet (4 mg total) by mouth every 8 (eight) hours as needed. Must last 30 days. ?Start taking on: Nov 28, 2021 ?  ?HYDROmorphone 4 MG tablet ?Commonly known as: DILAUDID ?Take 1 tablet (4 mg total) by mouth every 8 (eight) hours as needed. Must last 30 days. ?Start taking on: December 28, 2021 ?  ?latanoprost 0.005 % ophthalmic solution ?Commonly known as: XALATAN ?SMARTSIG:1 Drop(s) In Eye(s) Every Evening ?  ?levothyroxine 125 MCG tablet ?Commonly known as: SYNTHROID ?Take 125 mcg by mouth daily. ?  ?losartan 50 MG tablet ?Commonly known as: COZAAR ?Take 1 tablet by mouth daily. ?  ?methocarbamol 500 MG tablet ?Commonly known as: Robaxin ?Take 1 tablet (500 mg total) by mouth every 12 (twelve) hours as needed for muscle spasms. ?  ?MULTIVITAMIN ADULT PO ?Take 1 capsule by mouth daily. ?  ?  naloxone 4 MG/0.1ML Liqd nasal spray kit ?Commonly known as: NARCAN ?Place into the nose. ?  ?pregabalin 75 MG capsule ?Commonly known as: Lyrica ?Take 1 capsule (75 mg total) by mouth 3 (three) times daily. ?  ?simvastatin 40 MG tablet ?Commonly known as: ZOCOR ?Take 40 mg by mouth daily. ?  ?Travoprost (BAK Free) 0.004 % Soln ophthalmic solution ?Commonly known as: TRAVATAN ?Place 1 drop into both eyes at bedtime. ?  ? ?  ? ? Follow-up Information   ? ? Tama High III, MD Follow up in 1 week(s).   ?Specialty: Internal Medicine ?Contact information: ?[facility] [address] ?[phone] ? ? ?  ?  ? ?  ?  ? ?  ? ?Discharge Exam: ?Filed Weights  ? 10/28/21 1100 10/30/21 0500  ?Weight: 96.8 kg 99.3 kg  ? ?General exam: Appears comfortable, not in any acute  distress.  Deconditioned ?Respiratory system: CTA bilaterally, no wheezing, no crackles, normal respiratory effort. ?Cardiovascular system: S1-S2 heard, regular rate and rhythm, no murmur. ?Gastrointestinal system: Abdomen

## 2021-11-02 LAB — CULTURE, BLOOD (ROUTINE X 2)
Culture: NO GROWTH
Culture: NO GROWTH
Special Requests: ADEQUATE
Special Requests: ADEQUATE

## 2021-11-22 NOTE — H&P (View-Only) (Signed)
PROVIDER NOTE: Information contained herein reflects review and annotations entered in association with encounter. Interpretation of such information and data should be left to medically-trained personnel. Information provided to patient can be located elsewhere in the medical record under "Patient Instructions". Document created using STT-dictation technology, any transcriptional errors that may result from process are unintentional.    Patient: [name]  Service Category: E/M  Provider: [name], MD  DOB: March 02, 1946  DOS: 11/26/2021  Specialty: Interventional Pain Management  MRN: [id number]  Setting: Ambulatory outpatient  PCP: Adin Hector, MD  Type: Established Patient    Referring Provider: Adin Hector, MD  Location: Office  Delivery: Face-to-face     HPI  Mr. TREVARIS PENNELLA, a 76 y.o. year old male, is here today because of his Chronic bilateral low back pain with bilateral sciatica [M54.42, M54.41, G89.29]. Mr. Summons primary complain today is Neck Pain (Bilateral ), Back Pain (Lumbar bilateral ), Leg Pain (Bilateral ), and Foot Pain (Bilateral ) Last encounter: My last encounter with him was on 10/17/2021. Pertinent problems: Mr. 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[place]); Chronic ankle pain (Bilateral); Pain in joint, ankle and foot; Polyneuropathy associated with underlying disease ([place]); Presence of intrathecal pump; Lumbosacral radiculopathy at S1 (Bilateral) (R>L); Chronic feet pain (3ry area of Pain) (Bilateral) (R>L); Chronic knee pain (4th area of Pain) (Bilateral) (R>L); Failed back surgical syndrome; Lower extremity weakness (Bilateral); Chronic neuropathic pain; Peripheral neurogenic pain;  Nonfamilial nocturnal leg cramps (Bilateral); Restless leg syndrome; Malfunction of intrathecal infusion pump; End of battery life of intrathecal infusion pump; Pain in right knee; DDD (degenerative disc disease), lumbosacral; Abnormal MRI, lumbar spine (11/01/2019); 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 Pain Assessment: Severity of Chronic pain is reported as a 8 /10. Location: Neck (see visit info for all sites.) Left, Right/into shoulder blades and down the arms.  ? leg pain coming from back. Onset: More than a month ago. Quality: Discomfort, Constant, Sharp. Timing: Constant. Modifying factor(s): resting. Vitals:  height is 6' 2.5" (1.892 m) and weight is 218 lb (98.9 kg). His temporal temperature is 98.4 F (36.9 C). His blood pressure is 139/78 and his pulse is 89. His respiration is 16 and oxygen saturation is 94%.   Reason for encounter: Pre-operative heart & lung assessment. I have discussed with the patient the benefits, risks, side effects, alternatives, likelihood of achieving goals and potential problems associated with the planned procedure. The patient refers understanding. Pre-operative cardiopulmonary assessment shows: Respiratory system: CTA bilaterally, no wheezing, no crackles, normal respiratory effort. Cardiovascular system: regular rate and rhythm, no murmur.  The patient was instructed to prophylactically take deep breath every so often so as to keep the  lung bases from developing atelectasis.  He recently had a rib fracture but he refers that when he takes deep breaths he is not really having any pain.  Today I have answered all patient's  questions.  The plan is to bring him in for removal of the intrathecal pump and catheter.  The patient indicates that the biggest thing that is currently bothering him is the fact that his pump is beeping.   The patient indicates doing well with the current medication regimen. No adverse reactions or side effects reported to the medications.   RTCB: 03/28/2022.  01/15/2022 for Lyrica.  Pharmacotherapy Assessment  Analgesic: Hydromorphone 4 mg tablet, 1 tab p.o. 3 times daily (last filled on 04/02/2021) MME/day: 0 mg/day    Monitoring: [place] PMP: PDMP reviewed during this encounter.       Pharmacotherapy: No side-effects or adverse reactions reported. Compliance: No problems identified. Effectiveness: Clinically acceptable.  Janett Billow, RN  11/26/2021 12:50 PM  Sign when Signing Visit Safety precautions to be maintained throughout the outpatient stay will include: orient to surroundings, keep bed in low position, maintain call bell within reach at all times, provide assistance with transfer out of bed and ambulation.     UDS:  Summary  Date Value Ref Range Status  09/19/2021 Note  Final    Comment:    ==================================================================== [employer] (MW) ==================================================================== Test                             Result       Flag       Units  Drug Present and Declared for Prescription Verification   Hydromorphone                  1862         EXPECTED   ng/mg creat    Hydromorphone may be administered as a scheduled prescription    medication; it is also an expected metabolite of hydrocodone.  ==================================================================== Test                      Result    Flag   Units       Ref Range   Creatinine              26               mg/dL      >=20 ==================================================================== Declared Medications:  The flagging and interpretation on this report are based on the  following declared medications.  Unexpected results may arise from  inaccuracies in the declared medications.   **Note: The testing scope of this panel includes these medications:   Hydromorphone (Dilaudid)   **Note: The testing scope of this panel does not include the  following reported medications:   Albuterol (Proair HFA)  Amitriptyline (Elavil)  Azelastine  Bisacodyl  Clonidine (Catapres)  Duloxetine (Cymbalta)  Eye Drop  Levothyroxine (Synthroid)  Losartan (Cozaar)  Multivitamin  Naloxone (Narcan)  Olodaterol  Pregabalin (Lyrica)  Promethazine (Phenergan)  Simvastatin (Zocor)  Tiotropium ==================================================================== For clinical consultation, please call [phone]. ====================================================================      ROS  Constitutional: Denies any fever or chills Gastrointestinal: No reported hemesis, hematochezia, vomiting, or acute GI distress Musculoskeletal: Denies any acute onset joint swelling, redness, loss of ROM, or weakness Neurological: No reported episodes of acute onset apraxia, aphasia, dysarthria, agnosia, amnesia, paralysis, loss of coordination, or loss of consciousness  Medication Review  DULoxetine, HYDROmorphone, albuterol, alfuzosin, amitriptyline, aspirin, azelastine, bisacodyl, cloNIDine, latanoprost, levothyroxine, losartan, methocarbamol, naloxone, pregabalin, senna-docusate, and simvastatin  History Review  Allergy: Mr. Nitta is allergic to celebrex [celecoxib], codeine, and penicillins.  Drug: Mr. Brueckner  reports no history of drug use. Alcohol:  reports no history of alcohol use. Tobacco:  reports that he has been smoking cigarettes. He has a 44.25  pack-year smoking history. He has never used smokeless tobacco. Social: Mr. Wetmore  reports that he has been smoking cigarettes. He has a 44.25 pack-year smoking history. He has never used smokeless tobacco. He reports that he does not drink alcohol and does not use drugs. Medical:  has a past medical history of Anxiety, Arthritis, Asthma, Cancer ([place]), CHF (congestive heart failure) ([place]), COPD (chronic obstructive pulmonary disease) ([place]), Coronary artery disease, Depression, Diabetes mellitus without complication ([place]), DVT of lower extremity, bilateral ([place]), Dysrhythmia, Hypertension, Neuropathy, Pain, Peripheral vascular disease ([place]), S/P IVC filter, Sleep apnea, Sleep disorder, and Stasis dermatitis of both legs. Surgical: Mr. Khamis  has a past surgical history that includes Coronary angioplasty; Back surgery; Nissen fundoplication; Bladder tumor excision; Mastoidectomy; Colon surgery; Cervical fusion; Intrathecal pump implantation; Infusion pump replacement; Transurethral resection of bladder tumor (N/A, 03/07/2015); and Colonoscopy with propofol (N/A, 02/27/2018). Family: family history includes Emphysema in his father and sister; Heart attack in his father; Heart disease in his brother and mother.  Laboratory Chemistry Profile   Renal Lab Results  Component Value Date   BUN 21 11/01/2021   CREATININE 0.97 11/01/2021   BCR 11 02/07/2021   GFRAA >60 03/02/2015   GFRNONAA >60 11/01/2021    Hepatic Lab Results  Component Value Date   AST 18 02/07/2021   ALBUMIN 4.2 02/07/2021   ALKPHOS 123 (H) 02/07/2021    Electrolytes Lab Results  Component Value Date   NA 141 11/01/2021   K 4.1 11/01/2021   CL 105 11/01/2021   CALCIUM 8.8 (L) 11/01/2021   MG 2.3 11/01/2021   PHOS 3.7 11/01/2021    Bone Lab Results  Component Value Date   25OHVITD1 22 (L) 02/07/2021   25OHVITD2 <1.0 02/07/2021   25OHVITD3 22 02/07/2021    Inflammation (CRP: Acute Phase) (ESR: Chronic Phase) Lab Results   Component Value Date   CRP 3 02/07/2021   ESRSEDRATE 6 02/07/2021   LATICACIDVEN 0.9 10/28/2021         Note: Above Lab results reviewed.  Recent Imaging Review  CT Angio Chest Pulmonary Embolism (PE) W or WO Contrast CLINICAL DATA:  Chest wall pain. Pulmonary embolism suspected. Recent fall with known fracture of the LEFT sixth rib.  EXAM: CT ANGIOGRAPHY CHEST WITH CONTRAST  TECHNIQUE: Multidetector CT imaging of the chest was performed using the standard protocol during bolus administration of intravenous contrast. Multiplanar CT image reconstructions and MIPs were obtained to evaluate the vascular anatomy.  RADIATION DOSE REDUCTION: This exam was performed according to the departmental dose-optimization program which includes automated exposure control, adjustment of the mA and/or kV according to patient size and/or use of iterative reconstruction technique.  CONTRAST:  168m OMNIPAQUE IOHEXOL 350 MG/ML SOLN  COMPARISON:  Chest CT dated 10/24/2021.  FINDINGS: Cardiovascular: There is no pulmonary embolism identified within the main, lobar or segmental pulmonary arteries bilaterally.  No thoracic aortic aneurysm or evidence of aortic dissection. No pericardial effusion. Three-vessel coronary artery calcifications.  Mediastinum/Nodes: No mass or enlarged lymph nodes are seen within the mediastinum or perihilar regions. Esophagus is unremarkable. Trachea and central bronchi are unremarkable.  Lungs/Pleura: Emphysematous changes bilaterally, moderate to severe in degree within the upper lobes. Patchy consolidations at the bilateral lung bases, most likely atelectasis. Small LEFT pleural effusion. No pneumothorax.  Upper Abdomen: Limited images  of the upper abdomen are unremarkable.  Musculoskeletal: Acute-appearing displaced fractures of the LEFT lateral fifth and sixth ribs.  Mild degenerative spondylosis of the kyphotic thoracic spine.  Review of the MIP  images confirms the above findings.  IMPRESSION: 1. No pulmonary embolism. 2. Acute displaced fractures of the LEFT lateral fifth and sixth ribs. 3. Small LEFT pleural effusion. No pneumothorax. 4. New patchy consolidations at the bilateral lung bases, most likely atelectasis. 5. Three-vessel coronary artery calcifications.  Aortic Atherosclerosis (ICD10-I70.0) and Emphysema (ICD10-J43.9).  Electronically Signed   By: Franki Cabot M.D.   On: 10/28/2021 09:20 CT Cervical Spine Wo Contrast CLINICAL DATA:  Minor head trauma  EXAM: CT HEAD WITHOUT CONTRAST  CT CERVICAL SPINE WITHOUT CONTRAST  TECHNIQUE: Multidetector CT imaging of the head and cervical spine was performed following the standard protocol without intravenous contrast. Multiplanar CT image reconstructions of the cervical spine were also generated.  RADIATION DOSE REDUCTION: This exam was performed according to the departmental dose-optimization program which includes automated exposure control, adjustment of the mA and/or kV according to patient size and/or use of iterative reconstruction technique.  COMPARISON:  10/04/2020  FINDINGS: CT HEAD FINDINGS  Brain: No evidence of acute infarction, hemorrhage, hydrocephalus, extra-axial collection or mass lesion/mass effect.  Vascular: No hyperdense vessel or unexpected calcification.  Skull: No acute fracture.  Right mastoidectomy with clear bowl.  Sinuses/Orbits: No evidence of injury  CT CERVICAL SPINE FINDINGS  Alignment: Straightening of cervical lordosis. Mild degenerative C3-4 and C4-5 anterolisthesis.  Skull base and vertebrae: No acute fracture. No primary bone lesion or focal pathologic process.  Soft tissues and spinal canal: No prevertebral fluid or swelling. No visible canal hematoma.  Disc levels: Lower cervical degenerative disc narrowing and ridging. Generalized facet spurring. Spurs and disc height loss causes foraminal narrowing  especially at C4-5 and below.  Upper chest: Panlobular emphysema.  IMPRESSION: No evidence of acute intracranial or cervical spine injury.  Electronically Signed   By: Jorje Guild M.D.   On: 10/28/2021 06:47 CT Head Wo Contrast CLINICAL DATA:  Minor head trauma  EXAM: CT HEAD WITHOUT CONTRAST  CT CERVICAL SPINE WITHOUT CONTRAST  TECHNIQUE: Multidetector CT imaging of the head and cervical spine was performed following the standard protocol without intravenous contrast. Multiplanar CT image reconstructions of the cervical spine were also generated.  RADIATION DOSE REDUCTION: This exam was performed according to the departmental dose-optimization program which includes automated exposure control, adjustment of the mA and/or kV according to patient size and/or use of iterative reconstruction technique.  COMPARISON:  10/04/2020  FINDINGS: CT HEAD FINDINGS  Brain: No evidence of acute infarction, hemorrhage, hydrocephalus, extra-axial collection or mass lesion/mass effect.  Vascular: No hyperdense vessel or unexpected calcification.  Skull: No acute fracture.  Right mastoidectomy with clear bowl.  Sinuses/Orbits: No evidence of injury  CT CERVICAL SPINE FINDINGS  Alignment: Straightening of cervical lordosis. Mild degenerative C3-4 and C4-5 anterolisthesis.  Skull base and vertebrae: No acute fracture. No primary bone lesion or focal pathologic process.  Soft tissues and spinal canal: No prevertebral fluid or swelling. No visible canal hematoma.  Disc levels: Lower cervical degenerative disc narrowing and ridging. Generalized facet spurring. Spurs and disc height loss causes foraminal narrowing especially at C4-5 and below.  Upper chest: Panlobular emphysema.  IMPRESSION: No evidence of acute intracranial or cervical spine injury.  Electronically Signed   By: Jorje Guild M.D.   On: 10/28/2021 06:47 DG Chest 2 View CLINICAL DATA:  76 year old male  with  history of trauma from a fall. Left-sided chest pain.  EXAM: CHEST - 2 VIEW  COMPARISON:  Rib radiographs 10/26/2021.  FINDINGS: Lung volumes are very low, with chronic elevation of the left hemidiaphragm, which is very similar to the prior study. Small right pleural effusion. Opacity at the right lung base which may reflect atelectasis and/or consolidation. No definite left pleural effusion. Widespread areas of interstitial prominence throughout the lungs bilaterally, with diffuse peribronchial cuffing. Some emphysematous changes are also noted throughout the mid to upper lungs. No pneumothorax. No evidence of pulmonary edema. Heart size is upper limits of normal. Upper mediastinal contours are within normal limits. Atherosclerotic calcifications in the thoracic aorta.  IMPRESSION: 1. Low lung volumes with elevation of the right hemidiaphragm and areas of atelectasis and/or consolidation in the right lower lobe. 2. Diffuse interstitial prominence and peribronchial cuffing, concerning for an acute bronchitis. 3. Emphysema. 4. Aortic atherosclerosis.  Electronically Signed   By: Vinnie Langton M.D.   On: 10/28/2021 06:05 Note: Reviewed        Physical Exam  General appearance: Well nourished, well developed, and well hydrated. In no apparent acute distress Mental status: Alert, oriented x 3 (person, place, & time)       Respiratory: No evidence of acute respiratory distress.  Despite the fact that the patient refers having had a recent rib fracture, he seems to be clear to auscultation with no evidence of rales or rhonchi.  No wheezing. Cardiovascular: Regular rate and rhythm, no perceptible murmurs.  No carotid bruits. Eyes: PERLA Vitals: BP 139/78 (BP Location: Left Arm, Patient Position: Sitting, Cuff Size: Normal)   Pulse 89   Temp 98.4 F (36.9 C) (Temporal)   Resp 16   Ht 6' 2.5" (1.892 m)   Wt 218 lb (98.9 kg)   SpO2 94%   BMI 27.62 kg/m  BMI: Estimated  body mass index is 27.62 kg/m as calculated from the following:   Height as of this encounter: 6' 2.5" (1.892 m).   Weight as of this encounter: 218 lb (98.9 kg).  Assessment   Diagnosis Status  1. Chronic low back pain (1ry area of Pain) (Bilateral) (R>L) w/ sciatica (Bilateral)   2. Chronic lower extremity pain (2ry area of Pain) (Bilateral) (R>L)   3. Failed back surgical syndrome   4. Malfunction of intrathecal infusion pump, sequela   5. End of battery life of intrathecal infusion pump   6. DDD (degenerative disc disease), lumbosacral   7. 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 Lower extremity weakness (Bilateral)   9. Lumbar foraminal narrowing (Multilevel) (Bilateral)   10. Lumbar post-laminectomy syndrome   11. Lumbosacral facet syndrome (Bilateral)   12. Lumbosacral radiculopathy at S1 (Bilateral) (R>L)   13. Chronic pain syndrome   14. Chronic feet pain (3ry area of Pain) (Bilateral) (R>L)   15. Chronic knee pain (4th area of Pain) (Bilateral) (R>L)   16. Pharmacologic therapy   17. Chronic use of opiate for therapeutic purpose   18. Encounter for medication management   19. Encounter for chronic pain management   20. Chronic neurogenic pain   21. Peripheral neurogenic pain   22. Polyneuropathy associated with underlying disease ([place])    Controlled Controlled Controlled   Updated Problems: No problems updated.   Plan of Care  Problem-specific:  No problem-specific Assessment & Plan notes found for this encounter.  Mr. WATT GEILER has a current medication list which includes the following long-term medication(s): albuterol,  amitriptyline, azelastine, duloxetine, [START ON 12/28/2021] hydromorphone, [START ON 01/27/2022] hydromorphone, [START ON 02/26/2022] hydromorphone, levothyroxine, losartan, pregabalin, simvastatin, and [START ON 01/15/2022] pregabalin.  Pharmacotherapy (Medications Ordered): Meds ordered this encounter   Medications   HYDROmorphone (DILAUDID) 4 MG tablet    Sig: Take 1 tablet (4 mg total) by mouth every 8 (eight) hours as needed. Must last 30 days.    Dispense:  90 tablet    Refill:  0    DO NOT: delete (not duplicate); no partial-fill (will deny script to complete), no refill request (F/U required). DISPENSE: 1 day early if closed on fill date. WARN: No CNS-depressants within 8 hrs of med.   HYDROmorphone (DILAUDID) 4 MG tablet    Sig: Take 1 tablet (4 mg total) by mouth every 8 (eight) hours as needed. Must last 30 days.    Dispense:  90 tablet    Refill:  0    DO NOT: delete (not duplicate); no partial-fill (will deny script to complete), no refill request (F/U required). DISPENSE: 1 day early if closed on fill date. WARN: No CNS-depressants within 8 hrs of med.   pregabalin (LYRICA) 75 MG capsule    Sig: Take 1 capsule (75 mg total) by mouth 3 (three) times daily.    Dispense:  90 capsule    Refill:  2    Fill one day early if pharmacy is closed on scheduled refill date. May substitute for generic if available.   Orders:  No orders of the defined types were placed in this encounter.  Follow-up plan:   Return in about 4 months (around 03/28/2022) for Eval-day (M,W), (F2F), (MM).     Interventional Therapies  Risk  Complexity Considerations:   Estimated body mass index is 27.6 kg/m as calculated from the following:   Height as of this encounter: _0  (1.88 m).   Weight as of this encounter: 215 lb (97.5 kg). WNL   Planned  Pending:   Intrathecal catheter and pump removal (2ry to end of battery life & catheter malfunction)    Under consideration:   Diagnostic intrathecal pump catheter test/evaluation  Diagnostic/therapeutic bilateral lumbar facet MBB #1  Therapeutic/palliative left L4-5 LESI #1    Completed:   Diagnostic left caudal ESI and epidurogram x1 (04/24/2021) (LBP: 50/50/0  LEP; 100/100/50-60)  Diagnostic intrathecal pump catheter test/evaluation x1 (06/21/2021)  (Dx: Catheter malfunction/dysfunction)  Referral (02/07/2021 & 09/19/2021) to [facility] neurosurgery Deetta Perla, MD) for ITP replacement. Cardiology clearance to Dr. Clayborn Bigness requested (09/19/2021)    Therapeutic  Palliative (PRN) options:   None established    Recent Visits Date Type Provider Dept  10/17/21 Office Visit Milinda Pointer, MD [facility]  09/19/21 Office Visit Milinda Pointer, MD [facility]  Showing recent visits within past 90 days and meeting all other requirements Today's Visits Date Type Provider Dept  11/26/21 Office Visit Milinda Pointer, MD [facility]  Showing today's visits and meeting all other requirements Future Appointments No visits were found meeting these conditions. Showing future appointments within next 90 days and meeting all other requirements  I discussed the assessment and treatment plan with the patient. The patient was provided an opportunity to ask questions and all were answered. The patient agreed with the plan and demonstrated an understanding of the instructions.  Patient advised to call back or seek an in-person evaluation if the symptoms or condition worsens.  Duration of encounter: 30 minutes.  Note by: [name], MD Date: 11/26/2021; Time: 2:16 PM

## 2021-11-22 NOTE — Progress Notes (Signed)
PROVIDER NOTE: Information contained herein reflects review and annotations entered in association with encounter. Interpretation of such information and data should be left to medically-trained personnel. Information provided to patient can be located elsewhere in the medical record under "Patient Instructions". Document created using STT-dictation technology, any transcriptional errors that may result from process are unintentional.  ?  ?Patient: [name]  Service Category: E/M  Provider: [name], MD  ?DOB: 1945-09-09  DOS: 11/26/2021  Specialty: Interventional Pain Management  ?MRN: [id number]  Setting: Ambulatory outpatient  PCP: Adin Hector, MD  ?Type: Established Patient    Referring Provider: Adin Hector, MD  ?Location: Office  Delivery: Face-to-face    ? ?HPI  ?[name], a 76 y.o. year old male, is here today because of his Chronic bilateral low back pain with bilateral sciatica [M54.42, M54.41, G89.29]. [name] primary complain today is Neck Pain (Bilateral ), Back Pain (Lumbar bilateral ), Leg Pain (Bilateral ), and Foot Pain (Bilateral ) ?Last encounter: My last encounter with him was on 10/17/2021. ?Pertinent problems: 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[place]); Chronic ankle pain (Bilateral); Pain in joint, ankle and foot; Polyneuropathy associated with underlying disease ([name]); Presence of intrathecal pump; Lumbosacral radiculopathy at S1 (Bilateral) (R>L); Chronic feet pain (3ry area of Pain) (Bilateral) (R>L); Chronic knee pain (4th area of Pain) (Bilateral) (R>L); Failed back surgical syndrome; Lower extremity weakness (Bilateral); Chronic neuropathic pain; Peripheral neurogenic pain;  Nonfamilial nocturnal leg cramps (Bilateral); Restless leg syndrome; Malfunction of intrathecal infusion pump; End of battery life of intrathecal infusion pump; Pain in right knee; DDD (degenerative disc disease), lumbosacral; Abnormal MRI, lumbar spine (11/01/2019); 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 ?Pain Assessment: Severity of Chronic pain is reported as a 8 /10. Location: Neck (see visit info for all sites.) Left, Right/into shoulder blades and down the arms.  ? leg pain coming from back. Onset: More than a month ago. Quality: Discomfort, Constant, Sharp. Timing: Constant. Modifying factor(s): resting. ?Vitals:  height is 6' 2.5" (1.892 m) and weight is 218 lb (98.9 kg). His temporal temperature is 98.4 ?F (36.9 ?C). His blood pressure is 139/78 and his pulse is 89. His respiration is 16 and oxygen saturation is 94%.  ? ?Reason for encounter: Pre-operative heart & lung assessment. I have discussed with the patient the benefits, risks, side effects, alternatives, likelihood of achieving goals and potential problems associated with the planned procedure. The patient refers understanding. Pre-operative cardiopulmonary assessment shows: Respiratory system: CTA bilaterally, no wheezing, no crackles, normal respiratory effort. Cardiovascular system: regular rate and rhythm, no murmur.  The patient was instructed to prophylactically take deep breath every so often so as to keep the  lung bases from developing atelectasis.  He recently had a rib fracture but he refers that when he takes deep breaths he is not really having any pain.  Today I have answered all patient's  questions.  The plan is to bring him in for removal of the intrathecal pump and catheter.  The patient indicates that the biggest thing that is currently bothering him is the fact that his pump is beeping. ? ? The patient indicates doing well with the current medication regimen. No adverse reactions or side effects reported to the medications.  ? ?RTCB: 03/28/2022.  01/15/2022 for Lyrica. ? ?Pharmacotherapy Assessment  ?Analgesic: Hydromorphone 4 mg tablet, 1 tab p.o. 3 times daily (last filled on 04/02/2021) ?MME/day: 0 mg/day   ? ?Monitoring: ?[place] PMP: PDMP reviewed during this encounter.       ?Pharmacotherapy: No side-effects or adverse reactions reported. ?Compliance: No problems identified. ?Effectiveness: Clinically acceptable. ? ?Janett Billow, RN  11/26/2021 12:50 PM  Sign when Signing Visit ?Safety precautions to be maintained throughout the outpatient stay will include: orient to surroundings, keep bed in low position, maintain call bell within reach at all times, provide assistance with transfer out of bed and ambulation.  ?   UDS:  ?Summary  ?Date Value Ref Range Status  ?09/19/2021 Note  Final  ?  Comment:  ?  ==================================================================== ?[employer] Select 13 (MW) ?==================================================================== ?Test                             Result       Flag       Units ? ?Drug Present and Declared for Prescription Verification ?  Hydromorphone                  1862         EXPECTED   ng/mg creat ?   Hydromorphone may be administered as a scheduled prescription ?   medication; it is also an expected metabolite of hydrocodone. ? ?==================================================================== ?Test                      Result    Flag   Units       Ref Range ?  Creatinine              26               mg/dL      >=20 ?==================================================================== ?Declared Medications: ? The flagging and interpretation on this report are based on the ? following declared medications.  Unexpected results may arise from ? inaccuracies in the declared medications. ? ? **Note: The testing scope of this panel includes these medications: ? ? Hydromorphone (Dilaudid) ? ? **Note: The testing scope of this panel does not include the ? following reported medications: ? ? Albuterol (Proair HFA) ? Amitriptyline (Elavil) ? Azelastine ? Bisacodyl ? Clonidine (Catapres) ? Duloxetine (Cymbalta) ? Eye Drop ? Levothyroxine (Synthroid) ? Losartan (Cozaar) ? Multivitamin ? Naloxone (Narcan) ? Olodaterol ? Pregabalin (Lyrica) ? Promethazine (Phenergan) ? Simvastatin (Zocor) ? Tiotropium ?==================================================================== ?For clinical consultation, please call [phone]. ?==================================================================== ?  ?  ? ?ROS  ?Constitutional: Denies any fever or chills ?Gastrointestinal: No reported hemesis, hematochezia, vomiting, or acute GI distress ?Musculoskeletal: Denies any acute onset joint swelling, redness, loss of ROM, or weakness ?Neurological: No reported episodes of acute onset apraxia, aphasia, dysarthria, agnosia, amnesia, paralysis, loss of coordination, or loss of consciousness ? ?Medication Review  ?DULoxetine, HYDROmorphone, albuterol, alfuzosin, amitriptyline, aspirin, azelastine, bisacodyl, cloNIDine, latanoprost, levothyroxine, losartan, methocarbamol, naloxone, pregabalin, senna-docusate, and simvastatin ? ?History Review  ?Allergy: Mr. Gullikson is allergic to celebrex [celecoxib], codeine, and penicillins. ?  Drug: Mr. Luczynski  reports no history of drug use. ?Alcohol:  reports no history of alcohol use. ?Tobacco:  reports that he has been smoking cigarettes. He has a 44.25  pack-year smoking history. He has never used smokeless tobacco. ?Social: Mr. Troiano  reports that he has been smoking cigarettes. He has a 44.25 pack-year smoking history. He has never used smokeless tobacco. He r

## 2021-11-26 ENCOUNTER — Encounter: Payer: Self-pay | Admitting: Pain Medicine

## 2021-11-26 ENCOUNTER — Ambulatory Visit: Payer: Medicare (Managed Care) | Attending: Pain Medicine | Admitting: Pain Medicine

## 2021-11-26 ENCOUNTER — Other Ambulatory Visit: Payer: Self-pay

## 2021-11-26 VITALS — BP 139/78 | HR 89 | Temp 98.4°F | Resp 16 | Ht 74.5 in | Wt 218.0 lb

## 2021-11-26 DIAGNOSIS — G63 Polyneuropathy in diseases classified elsewhere: Secondary | ICD-10-CM | POA: Insufficient documentation

## 2021-11-26 DIAGNOSIS — M79605 Pain in left leg: Secondary | ICD-10-CM | POA: Insufficient documentation

## 2021-11-26 DIAGNOSIS — G8929 Other chronic pain: Secondary | ICD-10-CM | POA: Insufficient documentation

## 2021-11-26 DIAGNOSIS — M5442 Lumbago with sciatica, left side: Secondary | ICD-10-CM | POA: Insufficient documentation

## 2021-11-26 DIAGNOSIS — Z79899 Other long term (current) drug therapy: Secondary | ICD-10-CM | POA: Insufficient documentation

## 2021-11-26 DIAGNOSIS — M5417 Radiculopathy, lumbosacral region: Secondary | ICD-10-CM | POA: Diagnosis present

## 2021-11-26 DIAGNOSIS — Z462 Encounter for fitting and adjustment of other devices related to nervous system and special senses: Secondary | ICD-10-CM | POA: Insufficient documentation

## 2021-11-26 DIAGNOSIS — M25562 Pain in left knee: Secondary | ICD-10-CM | POA: Diagnosis present

## 2021-11-26 DIAGNOSIS — M79604 Pain in right leg: Secondary | ICD-10-CM | POA: Diagnosis present

## 2021-11-26 DIAGNOSIS — M79672 Pain in left foot: Secondary | ICD-10-CM | POA: Insufficient documentation

## 2021-11-26 DIAGNOSIS — T85610S Breakdown (mechanical) of epidural and subdural infusion catheter, sequela: Secondary | ICD-10-CM | POA: Insufficient documentation

## 2021-11-26 DIAGNOSIS — M961 Postlaminectomy syndrome, not elsewhere classified: Secondary | ICD-10-CM | POA: Diagnosis present

## 2021-11-26 DIAGNOSIS — R29898 Other symptoms and signs involving the musculoskeletal system: Secondary | ICD-10-CM | POA: Insufficient documentation

## 2021-11-26 DIAGNOSIS — M25561 Pain in right knee: Secondary | ICD-10-CM | POA: Insufficient documentation

## 2021-11-26 DIAGNOSIS — M792 Neuralgia and neuritis, unspecified: Secondary | ICD-10-CM | POA: Insufficient documentation

## 2021-11-26 DIAGNOSIS — M47817 Spondylosis without myelopathy or radiculopathy, lumbosacral region: Secondary | ICD-10-CM | POA: Insufficient documentation

## 2021-11-26 DIAGNOSIS — G894 Chronic pain syndrome: Secondary | ICD-10-CM | POA: Diagnosis present

## 2021-11-26 DIAGNOSIS — M79671 Pain in right foot: Secondary | ICD-10-CM | POA: Insufficient documentation

## 2021-11-26 DIAGNOSIS — M5137 Other intervertebral disc degeneration, lumbosacral region: Secondary | ICD-10-CM | POA: Diagnosis present

## 2021-11-26 DIAGNOSIS — Z79891 Long term (current) use of opiate analgesic: Secondary | ICD-10-CM | POA: Insufficient documentation

## 2021-11-26 DIAGNOSIS — M48062 Spinal stenosis, lumbar region with neurogenic claudication: Secondary | ICD-10-CM | POA: Diagnosis present

## 2021-11-26 DIAGNOSIS — M48061 Spinal stenosis, lumbar region without neurogenic claudication: Secondary | ICD-10-CM | POA: Insufficient documentation

## 2021-11-26 DIAGNOSIS — M5441 Lumbago with sciatica, right side: Secondary | ICD-10-CM | POA: Diagnosis present

## 2021-11-26 MED ORDER — HYDROMORPHONE HCL 4 MG PO TABS: 4.0000 mg | ORAL_TABLET | Freq: Three times a day (TID) | ORAL | 0 refills | Status: DC | PRN

## 2021-11-26 MED ORDER — PREGABALIN 75 MG PO CAPS: 75.0000 mg | ORAL_CAPSULE | Freq: Three times a day (TID) | ORAL | 2 refills | Status: DC

## 2021-11-26 NOTE — Progress Notes (Signed)
Safety precautions to be maintained throughout the outpatient stay will include: orient to surroundings, keep bed in low position, maintain call bell within reach at all times, provide assistance with transfer out of bed and ambulation.  

## 2021-12-13 ENCOUNTER — Encounter
Admission: RE | Admit: 2021-12-13 | Discharge: 2021-12-13 | Disposition: A | Discharge status: Home / Self Care | Payer: Medicare (Managed Care) | Source: Ambulatory Visit | Attending: Pain Medicine | Admitting: Pain Medicine

## 2021-12-13 DIAGNOSIS — Z01818 Encounter for other preprocedural examination: Secondary | ICD-10-CM

## 2021-12-13 HISTORY — DX: Personal history of other diseases of the digestive system: Z87.19

## 2021-12-13 HISTORY — DX: Hypothyroidism, unspecified: E03.9

## 2021-12-13 HISTORY — DX: Dyspnea, unspecified: R06.00

## 2021-12-13 NOTE — Patient Instructions (Signed)
Your procedure is scheduled on:12-20-21 Thursday Report to the Registration Desk on the 1st floor of the [facility].Then proceed to the 2nd floor Surgery Desk To find out your arrival time, please call [phone] between 1PM - 3PM on:12-19-21 Wednesday If your arrival time is 6:00 am, do not arrive prior to that time as the [facility] entrance doors do not open until 6:00 am.  REMEMBER: Instructions that are not followed completely may result in serious medical risk, up to and including death; or upon the discretion of your surgeon and anesthesiologist your surgery may need to be rescheduled.  Do not eat food after midnight the night before surgery.  No gum chewing, lozengers or hard candies.  You may however, drink Water up to 2 hours before you are scheduled to arrive for your surgery. Do not drink anything within 2 hours of your scheduled arrival time.  Type 1 and Type 2 diabetics should only drink water.  TAKE THESE MEDICATIONS THE MORNING OF SURGERY WITH A SIP OF WATER: -cloNIDine (CATAPRES) -DULoxetine (CYMBALTA)  -HYDROmorphone (DILAUDID) -levothyroxine (SYNTHROID) -pregabalin (LYRICA) -simvastatin (ZOCOR)  Call Dr Adalberto Cole office 12-14-21 to see when you need to stop your 81 mg Aspirin  One week prior to surgery: Stop Anti-inflammatories (NSAIDS) such as Advil, Aleve, Ibuprofen, Motrin, Naproxen, Naprosyn and Aspirin based products such as Excedrin, Goodys Powder, BC Powder.You may however, take Tylenol if needed for pain up until the day of surgery. Stop ANY OVER THE COUNTER supplements/vitamins NOW (12-13-21) until after surgery.  No Alcohol for 24 hours before or after surgery.  No Smoking including e-cigarettes for 24 hours prior to surgery.  No chewable tobacco products for at least 6 hours prior to surgery.  No nicotine patches on the day of surgery.  Do not use any "recreational" drugs for at least a week prior to your surgery.  Please be advised that the  combination of cocaine and anesthesia may have negative outcomes, up to and including death. If you test positive for cocaine, your surgery will be cancelled.  On the morning of surgery brush your teeth with toothpaste and water, you may rinse your mouth with mouthwash if you wish. Do not swallow any toothpaste or mouthwash.  Do not wear jewelry, make-up, hairpins, clips or nail polish.  Do not wear lotions, powders, or perfumes.   Do not shave body from the neck down 48 hours prior to surgery just in case you cut yourself which could leave a site for infection.  Also, freshly shaved skin may become irritated if using the CHG soap.  Contact lenses, hearing aids and dentures may not be worn into surgery.  Do not bring valuables to the hospital. [facility] is not responsible for any missing/lost belongings or valuables.   Notify your doctor if there is any change in your medical condition (cold, fever, infection).  Wear comfortable clothing (specific to your surgery type) to the hospital.  After surgery, you can help prevent lung complications by doing breathing exercises.  Take deep breaths and cough every 1-2 hours. Your doctor may order a device called an Incentive Spirometer to help you take deep breaths. When coughing or sneezing, hold a pillow firmly against your incision with both hands. This is called "splinting." Doing this helps protect your incision. It also decreases belly discomfort.  If you are being admitted to the hospital overnight, leave your suitcase in the car. After surgery it may be brought to your room.  If you are being discharged  the day of surgery, you will not be allowed to drive home. You will need a responsible adult (18 years or older) to drive you home and stay with you that night.   If you are taking public transportation, you will need to have a responsible adult (18 years or older) with you. Please confirm with your physician that it is acceptable to  use public transportation.   Please call the [facility] at [phone] if you have any questions about these instructions.  Surgery Visitation Policy:  Patients undergoing a surgery or procedure may have two family members or support persons with them as long as the person is not COVID-19 positive or experiencing its symptoms.

## 2021-12-18 ENCOUNTER — Encounter: Payer: Self-pay | Admitting: Pain Medicine

## 2021-12-18 NOTE — Progress Notes (Signed)
Perioperative Services  Pre-Admission/Anesthesia Testing Clinical Review  Date: 12/20/21  Patient Demographics:  Name: [name] DOB:   03-15-1946 MRN:   [id number]  Planned Surgical Procedure(s):    Case: [id number] Date/Time: 12/20/21 1237   Procedure: PAIN PUMP REMOVAL   Anesthesia type: Monitor Anesthesia Care   Pre-op diagnosis: BACK PAIN   Location: [facility] OR ROOM 06 / [facility]   Surgeons: Milinda Pointer, MD   NOTE: Available PAT nursing documentation and vital signs have been reviewed. Clinical nursing staff has updated patient's PMH/PSHx, current medication list, and drug allergies/intolerances to ensure comprehensive history available to assist in medical decision making as it pertains to the aforementioned surgical procedure and anticipated anesthetic course. Extensive review of available clinical information performed. [place] PMH and PSHx updated with any diagnoses/procedures that  may have been inadvertently omitted during his intake with the pre-admission testing department's nursing staff.  Clinical Discussion:  [name] is a 76 y.o. male who is submitted for pre-surgical anesthesia review and clearance prior to him undergoing the above procedure. Patient is a Current Smoker (44.25 pack years). Pertinent PMH includes: CAD, MI, NSVT, angina, PAF, CHF, bradycardia, aortic atherosclerosis, DVT, PVD, HTN, HLD, T2DM, hypothyroidism, asthma, COPD, RIGHT hemidiaphragm paralysis, GERD (no daily Tx), hiatal hernia (s/p Nissen fundoplication), OSAH (does not utilize nocturnal PAP therapy), OA, DJD, chronic lower back pain (s/p laminectomy), chronic pain syndrome (on COT; naloxone Rx available), OIC, agent orange exposure, anxiety, depression.  Patient is followed by cardiology Clayborn Bigness, MD). He was last seen in the cardiology clinic on 12/19/2021. Attempted to review notes, however upon review, note was pending dictation. Patient with a past medical  history significant for cardiovascular diagnoses. Of note, all record regarding cardiovascular health not available at time of consult. Information obtained from patient and notes from cardiologist.   Patient reportedly suffered an MI back in 11/1998 that was treated with PCI and stent placement to LCx (type unknown).   Patient suffered an NSTEMI on 12/03/2004. LHC at that time revealed a low normal left ventricular systolic function with an EF of 50%.  There was multivessel CAD; 75% proximal LAD, 30% proximal RCA, and 30% mid RCA.  PCI was performed placing a 3.0 x 13 mm Cypher DES to the proximal LAD yielding excellent angiographic result and TIMI-3 flow.  Last cardiac catheterization was performed on 02/19/2008 revealing multivessel CAD; 40% mid LAD, 25% proximal LCx, 30% proximal RCA-1, 40% proximal RCA-2, 40% mid RCA, and 10% distal RCA.  There was ISR noted in previously placed stents; 10% proximal LAD and 10% LCx.  Further intervention was deferred opting for aggressive medical management.  Last myocardial perfusion imaging study was performed on 01/19/2018 revealing a moderately reduced left ventricular systolic function of 24-26%.  There was inferior wall hypokinesis noted.  Additionally, there was an inferior wall perfusion abnormality observed consistent with scar.  There was no evidence of reversible ischemia.  Study determined to be abnormal overall.  Last TTE was performed on 08/04/2019 revealing a moderately reduced left ventricular systolic function with mild LVH; LVEF 35%.  Left atrium moderately enlarged.  There was trivial to mild pan valvular regurgitation.  There was no significant transvalvular gradient noted to suggest stenosis.  Blood pressure reasonably controlled at 138/74 on currently prescribed alpha blocker and ARB therapies. He is on a statin for his HLD diagnosis and further ASCVD prevention. T2DM is well controlled on currently prescribed regimen; last HgbA1c was 6.3% when  checked on  08/16/2021. Patient has an OSAH diagnosis, however he does not utilize prescribed nocturnal PAP therapy. He was formerly on supplemental oxygen at night, however he has stopped using this as well. Functional capacity, as defined by DASI, is documented as being >/= 4 METS. No changes were made to patient's medication regimen.  Patient to follow-up with outpatient cardiology in 3 months or sooner if needed.  [name] is scheduled for an PAIN PUMP REMOVAL on 12/20/2021 with Dr. Meade Maw, MD.  Given patient's past medical history significant for cardiovascular diagnoses, presurgical cardiac clearance was sought by the PAT team. Per cardiology, "this patient is optimized for surgery and may proceed with the planned procedural course with a MODERATE risk of significant perioperative cardiovascular complications".  In review of his medication reconciliation, it is noted the patient is on daily antiplatelet therapy. He has been instructed on recommendations from his cardiologist for holding his daily low-dose ASA for 5 days prior to his procedure with plans to restart as soon as postoperative bleeding risk felt to be minimized by his primary attending surgeon. The patient is aware that his last dose of ASA should be on 12/14/2021.  Patient denies previous perioperative complications with anesthesia in the past. In review of the available records, it is noted that patient underwent a general + regional anesthetic course at [facility] (ASA IV) in 10/2020 without documented complications.      11/26/2021   12:45 PM 11/01/2021    7:41 AM 11/01/2021    5:22 AM  Vitals with BMI  Height 6' 2.5"    Weight 218 lbs    BMI 56.43    Systolic 329 518 841  Diastolic 78 98 77  Pulse 89 50 46    Providers/Specialists:   NOTE: Primary physician provider listed below. Patient may have been seen by APP or partner within same practice.   PROVIDER ROLE /  SPECIALTY LAST Avel Peace, MD Pain Management (Surgeon) 11/26/2021  Adin Hector, MD Primary Care Provider 11/07/2021  Katrine Coho, MD Cardiology 12/19/2021   Allergies:  Celebrex [celecoxib], Codeine, and Penicillins  Current Home Medications:   No current facility-administered medications for this encounter.    albuterol (VENTOLIN HFA) 108 (90 Base) MCG/ACT inhaler   alfuzosin (UROXATRAL) 10 MG 24 hr tablet   amitriptyline (ELAVIL) 100 MG tablet   aspirin EC 81 MG EC tablet   azelastine (ASTELIN) 0.1 % nasal spray   bisacodyl (DULCOLAX) 5 MG EC tablet   cloNIDine (CATAPRES) 0.1 MG tablet   DULoxetine (CYMBALTA) 60 MG capsule   [START ON 12/28/2021] HYDROmorphone (DILAUDID) 4 MG tablet   [START ON 01/27/2022] HYDROmorphone (DILAUDID) 4 MG tablet   [START ON 02/26/2022] HYDROmorphone (DILAUDID) 4 MG tablet   latanoprost (XALATAN) 0.005 % ophthalmic solution   levothyroxine (SYNTHROID) 125 MCG tablet   loratadine (CLARITIN) 10 MG tablet   losartan (COZAAR) 50 MG tablet   methocarbamol (ROBAXIN) 500 MG tablet   naloxone (NARCAN) nasal spray 4 mg/0.1 mL   pregabalin (LYRICA) 75 MG capsule   [START ON 01/15/2022] pregabalin (LYRICA) 75 MG capsule   promethazine (PHENERGAN) 25 MG tablet   senna-docusate (SENOKOT-S) 8.6-50 MG tablet   simvastatin (ZOCOR) 40 MG tablet   History:   Past Medical History:  Diagnosis Date   Acute anterolateral wall MI ([place]) 2000   a.) PCI with stent x 1 to LCx (type unknown).   Acute non-Q wave non-ST elevation myocardial infarction (NSTEMI) (  [place]) 12/03/2004   a.) LHC 12/04/2004: EF 50%; 75% pLAD, 30% pRCA, 30% mRCA --> PCI performed placing a 3.0 x 13 mm Cypher DES to pLAD.   Adenomatous polyp of colon    Agent orange exposure    a.) [place]   Angina pectoris ([place])    Anxiety    Aortic atherosclerosis ([place])    Asthma    Bladder stones    Bradycardia    CHF (congestive heart failure) (HCC)    Chronic lower back pain     a.) post-laminectomy syndrome   Chronic pain syndrome    a.) on COT; has naloxone Rx in place   COPD (chronic obstructive pulmonary disease) ([place])    Coronary artery disease    a.) LHC 04/18/1987: normal cors.  b.) MI 2000 --> PCI with stent x 1 to LCx (unknown type). c.) LHC 02/03/2002: 20% pRCA, 20% mRCA-1, 20% mRCA-2, 50% mLAD; med mgmt. d.) NSTEMI 12/04/2004 --> LHC 75% pLAD, 30% pRCA, 30% mRCA --> PCI placing a 3.0x41m Cypher DES. e.) LHC 02/19/2008: 40 % mLAD, 25% pLCx, 30% pRCA-1, 40% pRCA-2, 40% mRCA, 10% dRCA; 10% ISR pLAD and LCx; med mgmt   Depression    DJD (degenerative joint disease)    DVT of lower extremity, bilateral ([place]    a.) IVC filter placed 09/2007; removed 2011.   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 b.) rate/rhythm maintained without pharmacological intervention; not currently on chronic anticoagulation therapy   Paralysis of RIGHT diaphragm    Peripheral vascular disease (HCC)    Rhabdomyolysis 2006   Senile purpura ([place]    Sepsis due to pneumonia ([place] 10/28/2021   Stasis dermatitis of both legs    T2DM (type 2 diabetes mellitus) ([place]    Therapeutic opioid-induced constipation (OIC)    a.) takes daily Senokot-S + bisacodyl   Past Surgical History:  Procedure Laterality Date   COLONOSCOPY N/A 11/22/2013   COLONOSCOPY N/A 09/19/2008    COLONOSCOPY N/A 08/25/2003   COLONOSCOPY N/A 10/15/1994   COLONOSCOPY WITH PROPOFOL N/A 02/27/2018   Procedure: COLONOSCOPY WITH PROPOFOL;  Surgeon: EManya Silvas MD;  [facility]  Service: Endoscopy;  Laterality: N/A;   CORONARY ANGIOPLASTY     2000   CORONARY ANGIOPLASTY WITH STENT PLACEMENT Left 12/04/2004   Procedure: CORONARY ANGIOPLASTY WITH STENT PLACEMENT; [place]geon: BSerafina Royals MD   EXTERNAL EAR SURGERY     x3   INTRATHECAL PUMP IMPLANTATION N/A 03/2008   INTRATHECAL PUMP REVISION N/A 09/28/2014   IVC FILTER PLACEMENT (AFordsHX) N/A 09/2007   IVC FILTER REMOVAL N/A 2011   LEFT HEART CATH AND CORONARY ANGIOGRAPHY Left 02/19/2008   Procedure: LEFT HEART CATH AND CORONARY ANGIOGRAPHY; Location: [facility]; Surgeon: BSerafina Royals MD   LEFT HEART CATH AND CORONARY ANGIOGRAPHY Left  04/18/1987   Procedure: LEFT HEART CATH AND CORONARY ANGIOGRAPHY; Location: [facility]; Surgeon: Sueanne Margarita, MD   LEFT HEART CATH AND CORONARY ANGIOGRAPHY Left 02/03/2002   Procedure: LEFT HEART CATH AND CORONARY ANGIOGRAPHY; Location: [place] [facility]; Surgeon: Katrine Coho, MD   MASTOIDECTOMY     x 3   NISSEN FUNDOPLICATION N/A 0960   PARTIAL COLECTOMY N/A    Procedure: PARTIAL BOWEL RESECTION WITH LYSIS OF ADHESIONS   POSTERIOR LAMINECTOMY / DECOMPRESSION CERVICAL SPINE N/A 1984   POSTERIOR LAMINECTOMY / DECOMPRESSION CERVICAL SPINE N/A 1985   POSTERIOR LUMBAR FUSION N/A 1999   SKIN CANCER EXCISION     TONSILLECTOMY Bilateral 1958   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   Procedure: TRANSURETHRAL RESECTION OF BLADDER TUMOR (TURBT);  Surgeon: Royston Cowper, MD;  Location: [facility]S;  Service: Urology;  Laterality: N/A;   Family History  Problem Relation Age of Onset   Heart disease Mother    Emphysema Father    Heart attack Father    Emphysema Sister    Heart disease Brother    Social History   Tobacco Use   Smoking status: Every Day    Packs/day: 0.75     Years: 59.00    Pack years: 44.25    Types: Cigarettes   Smokeless tobacco: Never  Vaping Use   Vaping Use: Never used  Substance Use Topics   Alcohol use: No   Drug use: No    Pertinent Clinical Results:  LABS: Labs reviewed: Acceptable for surgery.  Lab Results  Component Value Date   WBC 12.1 (H) 11/01/2021   HGB 15.4 11/01/2021   HCT 48.2 11/01/2021   MCV 89.1 11/01/2021   PLT 258 11/01/2021   Lab Results  Component Value Date   NA 141 11/01/2021   K 4.1 11/01/2021   CO2 29 11/01/2021   GLUCOSE 105 (H) 11/01/2021   BUN 21 11/01/2021   CREATININE 0.97 11/01/2021   CALCIUM 8.8 (L) 11/01/2021   EGFR 86 02/07/2021   GFRNONAA >60 11/01/2021    ECG: Date: 10/31/2021 Time ECG obtained: 0829 AM Rate: 70 bpm Rhythm:  Sinus rhythm with frequent PVCs in a bigeminy pattern Axis (leads I and aVF): Left axis deviation Intervals: PR 176 ms. QRS 100 ms. QTc 451 ms. ST segment and T wave changes: No evidence of acute ST segment elevation or depression. Evidence of age undetermined inferior and anterior infarcts noted.  Comparison: Bigeminy pattern noted when compared to tracing on 10/28/2021   IMAGING / PROCEDURES: CT ANGIO CHEST PULMONARY EMBOLISM (PE) W OR WO CONTRAST performed on 10/28/2021 No pulmonary embolism Acute displaced fractures of the LEFT lateral fifth and sixth ribs Small LEFT pleural effusion No pneumothorax New patchy consolidations at the BILATERAL lung bases most likely representing atelectasis Three-vessel coronary artery calcifications Aortic atherosclerosis Emphysema  TRANSTHORACIC ECHOCARDIOGRAM performed on 08/04/2019 Moderately reduced left ventricular systolic function with mild LVH; LVEF 35% Left atrium moderately enlarged Trivial AR and PR Mild MR and TR Normal gradients; no valvular stenosis No pericardial effusion  MYOCARDIAL PERFUSION IMAGING STUDY (LEXISCAN) performed on 01/19/2018 Moderately with reduced left ventricular  systolic function with an EF of 39% Inferior wall hypokinesis No artifact Left ventricular cavity enlarged SPECT images demonstrated a moderate perfusion abnormality of severe intensity present in the inferior wall region on stress images consistent with possible scar. No clear evidence of reversible ischemia.   Recommend medical management.  Impression and Plan:  [name] has been referred for pre-anesthesia review and  clearance prior to him undergoing the planned anesthetic and procedural courses. Available labs, pertinent testing, and imaging results were personally reviewed by me. This patient has been appropriately cleared by cardiology with an overall MODERATE risk of significant perioperative cardiovascular complications.  Based on clinical review performed today (12/20/21), barring any significant acute changes in the patient's overall condition, it is anticipated that he will be able to proceed with the planned surgical intervention. Any acute changes in clinical condition may necessitate his procedure being postponed and/or cancelled. Patient will meet with anesthesia team (MD and/or CRNA) on the day of his procedure for preoperative evaluation/assessment. Questions regarding anesthetic course will be fielded at that time.   Pre-surgical instructions were reviewed with the patient during his PAT appointment and questions were fielded by PAT clinical staff. Patient was advised that if any questions or concerns arise prior to his procedure then he should return a call to PAT and/or his surgeon's office to discuss.  Honor Loh, MSN, APRN, FNP-C, CEN [facility]  Peri-operative Services Nurse Practitioner Phone: [phone] Fax: [phone] 12/20/21 8:40 AM  NOTE: This note has been prepared using Dragon dictation software. Despite my best ability to proofread, there is always the potential that unintentional transcriptional errors may still occur from this  process.

## 2021-12-20 ENCOUNTER — Ambulatory Visit
Admission: RE | Admit: 2021-12-20 | Discharge: 2021-12-20 | Disposition: A | Discharge status: Home / Self Care | Payer: Medicare (Managed Care) | Attending: Pain Medicine | Admitting: Pain Medicine

## 2021-12-20 ENCOUNTER — Encounter: Payer: Self-pay | Admitting: Pain Medicine

## 2021-12-20 ENCOUNTER — Ambulatory Visit: Payer: Medicare (Managed Care) | Admitting: Urgent Care

## 2021-12-20 ENCOUNTER — Other Ambulatory Visit: Payer: Self-pay

## 2021-12-20 ENCOUNTER — Ambulatory Visit: Payer: Medicare (Managed Care)

## 2021-12-20 ENCOUNTER — Encounter
Admission: RE | Disposition: A | Discharge status: Home / Self Care | Payer: Self-pay | Source: Home / Self Care | Attending: Pain Medicine

## 2021-12-20 DIAGNOSIS — M79605 Pain in left leg: Secondary | ICD-10-CM | POA: Insufficient documentation

## 2021-12-20 DIAGNOSIS — M5442 Lumbago with sciatica, left side: Secondary | ICD-10-CM | POA: Insufficient documentation

## 2021-12-20 DIAGNOSIS — G4730 Sleep apnea, unspecified: Secondary | ICD-10-CM | POA: Insufficient documentation

## 2021-12-20 DIAGNOSIS — Z86718 Personal history of other venous thrombosis and embolism: Secondary | ICD-10-CM | POA: Diagnosis not present

## 2021-12-20 DIAGNOSIS — Y753 Surgical instruments, materials and neurological devices (including sutures) associated with adverse incidents: Secondary | ICD-10-CM | POA: Insufficient documentation

## 2021-12-20 DIAGNOSIS — I2510 Atherosclerotic heart disease of native coronary artery without angina pectoris: Secondary | ICD-10-CM | POA: Diagnosis not present

## 2021-12-20 DIAGNOSIS — Z9981 Dependence on supplemental oxygen: Secondary | ICD-10-CM | POA: Diagnosis not present

## 2021-12-20 DIAGNOSIS — E1142 Type 2 diabetes mellitus with diabetic polyneuropathy: Secondary | ICD-10-CM | POA: Insufficient documentation

## 2021-12-20 DIAGNOSIS — Z9889 Other specified postprocedural states: Secondary | ICD-10-CM | POA: Insufficient documentation

## 2021-12-20 DIAGNOSIS — F17210 Nicotine dependence, cigarettes, uncomplicated: Secondary | ICD-10-CM | POA: Insufficient documentation

## 2021-12-20 DIAGNOSIS — E039 Hypothyroidism, unspecified: Secondary | ICD-10-CM | POA: Diagnosis not present

## 2021-12-20 DIAGNOSIS — E1151 Type 2 diabetes mellitus with diabetic peripheral angiopathy without gangrene: Secondary | ICD-10-CM | POA: Insufficient documentation

## 2021-12-20 DIAGNOSIS — M542 Cervicalgia: Secondary | ICD-10-CM | POA: Insufficient documentation

## 2021-12-20 DIAGNOSIS — J439 Emphysema, unspecified: Secondary | ICD-10-CM | POA: Insufficient documentation

## 2021-12-20 DIAGNOSIS — M79604 Pain in right leg: Secondary | ICD-10-CM | POA: Insufficient documentation

## 2021-12-20 DIAGNOSIS — I509 Heart failure, unspecified: Secondary | ICD-10-CM | POA: Diagnosis not present

## 2021-12-20 DIAGNOSIS — F32A Depression, unspecified: Secondary | ICD-10-CM | POA: Insufficient documentation

## 2021-12-20 DIAGNOSIS — Z978 Presence of other specified devices: Secondary | ICD-10-CM

## 2021-12-20 DIAGNOSIS — Z01818 Encounter for other preprocedural examination: Secondary | ICD-10-CM

## 2021-12-20 DIAGNOSIS — T85610A Breakdown (mechanical) of epidural and subdural infusion catheter, initial encounter: Secondary | ICD-10-CM | POA: Insufficient documentation

## 2021-12-20 DIAGNOSIS — I110 Hypertensive heart disease with heart failure: Secondary | ICD-10-CM | POA: Insufficient documentation

## 2021-12-20 DIAGNOSIS — T85610S Breakdown (mechanical) of epidural and subdural infusion catheter, sequela: Secondary | ICD-10-CM

## 2021-12-20 DIAGNOSIS — C674 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: Secondary | ICD-10-CM | POA: Diagnosis not present

## 2021-12-20 DIAGNOSIS — F419 Anxiety disorder, unspecified: Secondary | ICD-10-CM | POA: Insufficient documentation

## 2021-12-20 DIAGNOSIS — G894 Chronic pain syndrome: Secondary | ICD-10-CM | POA: Diagnosis not present

## 2021-12-20 DIAGNOSIS — I700 Atherosclerosis of aorta: Secondary | ICD-10-CM | POA: Insufficient documentation

## 2021-12-20 DIAGNOSIS — M79672 Pain in left foot: Secondary | ICD-10-CM | POA: Diagnosis not present

## 2021-12-20 DIAGNOSIS — M79671 Pain in right foot: Secondary | ICD-10-CM | POA: Diagnosis not present

## 2021-12-20 DIAGNOSIS — M961 Postlaminectomy syndrome, not elsewhere classified: Secondary | ICD-10-CM | POA: Diagnosis present

## 2021-12-20 HISTORY — DX: Other nonthrombocytopenic purpura: D69.2

## 2021-12-20 HISTORY — DX: Angina pectoris, unspecified: I20.9

## 2021-12-20 HISTORY — DX: Hyperlipidemia, unspecified: E78.5

## 2021-12-20 HISTORY — DX: Obstructive sleep apnea (adult) (pediatric): G47.33

## 2021-12-20 HISTORY — DX: Low back pain, unspecified: M54.50

## 2021-12-20 HISTORY — DX: Calculus in bladder: N21.0

## 2021-12-20 HISTORY — DX: Type 2 diabetes mellitus without complications: E11.9

## 2021-12-20 HISTORY — DX: Atherosclerosis of aorta: I70.0

## 2021-12-20 HISTORY — DX: Paroxysmal atrial fibrillation: I48.0

## 2021-12-20 HISTORY — DX: Drug induced constipation: K59.03

## 2021-12-20 HISTORY — DX: Disorders of diaphragm: J98.6

## 2021-12-20 HISTORY — DX: Contact with and (suspected) exposure to other hazardous, chiefly nonmedicinal, chemicals: Z77.098

## 2021-12-20 HISTORY — DX: Gastro-esophageal reflux disease without esophagitis: K21.9

## 2021-12-20 HISTORY — DX: Adverse effect of other opioids, initial encounter: T40.2X5A

## 2021-12-20 HISTORY — DX: Inflammatory polyarthropathy: M06.4

## 2021-12-20 HISTORY — DX: Unspecified osteoarthritis, unspecified site: M19.90

## 2021-12-20 HISTORY — DX: Other hemorrhoids: K64.8

## 2021-12-20 HISTORY — DX: Benign neoplasm of colon, unspecified: D12.6

## 2021-12-20 HISTORY — DX: Age-related osteoporosis without current pathological fracture: M81.0

## 2021-12-20 HISTORY — PX: PAIN PUMP REMOVAL: SHX6391

## 2021-12-20 HISTORY — DX: Bradycardia, unspecified: R00.1

## 2021-12-20 HISTORY — DX: Other chronic pain: G89.29

## 2021-12-20 HISTORY — DX: Chronic pain syndrome: G89.4

## 2021-12-20 HISTORY — DX: Long term (current) use of opiate analgesic: Z79.891

## 2021-12-20 LAB — GLUCOSE, CAPILLARY
Glucose-Capillary: 125 mg/dL — ABNORMAL HIGH (ref 70–99)
Glucose-Capillary: 132 mg/dL — ABNORMAL HIGH (ref 70–99)

## 2021-12-20 SURGERY — PAIN PUMP REMOVAL
Anesthesia: Monitor Anesthesia Care

## 2021-12-20 MED ORDER — BUPIVACAINE HCL (PF) 0.5 % IJ SOLN
INTRAMUSCULAR | Status: AC
  Filled 2021-12-20: qty 30

## 2021-12-20 MED ORDER — LIDOCAINE HCL 2 % IJ SOLN
20.0000 mL | Freq: Once | INTRAMUSCULAR | Status: DC
  Filled 2021-12-20: qty 20

## 2021-12-20 MED ORDER — FENTANYL CITRATE (PF) 100 MCG/2ML IJ SOLN
INTRAMUSCULAR | Status: DC | PRN
  Administered 2021-12-20 (×2): 50 ug via INTRAVENOUS

## 2021-12-20 MED ORDER — FENTANYL CITRATE (PF) 100 MCG/2ML IJ SOLN
INTRAMUSCULAR | Status: AC
  Filled 2021-12-20: qty 2

## 2021-12-20 MED ORDER — MENTHOL 3 MG MT LOZG: 1.0000 | LOZENGE | OROMUCOSAL | Status: DC | PRN

## 2021-12-20 MED ORDER — EPHEDRINE 5 MG/ML INJ
INTRAVENOUS | Status: AC
  Filled 2021-12-20: qty 5

## 2021-12-20 MED ORDER — ONDANSETRON HCL 4 MG PO TABS: 4.0000 mg | ORAL_TABLET | Freq: Four times a day (QID) | ORAL | Status: DC | PRN

## 2021-12-20 MED ORDER — FENTANYL CITRATE (PF) 100 MCG/2ML IJ SOLN
INTRAMUSCULAR | Status: AC
  Administered 2021-12-20: 50 ug via INTRAVENOUS
  Filled 2021-12-20: qty 2

## 2021-12-20 MED ORDER — MIDAZOLAM HCL 2 MG/2ML IJ SOLN
INTRAMUSCULAR | Status: AC
  Filled 2021-12-20: qty 2

## 2021-12-20 MED ORDER — BACITRACIN ZINC 500 UNIT/GM EX OINT
TOPICAL_OINTMENT | CUTANEOUS | Status: DC | PRN
  Administered 2021-12-20: 1 via TOPICAL

## 2021-12-20 MED ORDER — OXYCODONE HCL 5 MG PO TABS: 5.0000 mg | ORAL_TABLET | Freq: Once | ORAL | Status: DC | PRN

## 2021-12-20 MED ORDER — ORAL CARE MOUTH RINSE: 15.0000 mL | Freq: Once | OROMUCOSAL | Status: AC

## 2021-12-20 MED ORDER — ACETAMINOPHEN 500 MG PO TABS
ORAL_TABLET | ORAL | Status: AC
  Filled 2021-12-20: qty 2

## 2021-12-20 MED ORDER — CEPHALEXIN 500 MG PO CAPS: 500.0000 mg | ORAL_CAPSULE | Freq: Three times a day (TID) | ORAL | 0 refills | Status: DC

## 2021-12-20 MED ORDER — BACITRACIN ZINC 500 UNIT/GM EX OINT
TOPICAL_OINTMENT | CUTANEOUS | Status: AC
Start: 2021-12-20 — End: ?
  Filled 2021-12-20: qty 28.35

## 2021-12-20 MED ORDER — LIDOCAINE HCL (CARDIAC) PF 100 MG/5ML IV SOSY
PREFILLED_SYRINGE | INTRAVENOUS | Status: DC | PRN
  Administered 2021-12-20: 50 mg via INTRAVENOUS

## 2021-12-20 MED ORDER — VANCOMYCIN HCL IN DEXTROSE 1-5 GM/200ML-% IV SOLN: 1000.0000 mg | INTRAVENOUS | Status: AC

## 2021-12-20 MED ORDER — ACETAMINOPHEN 325 MG PO TABS: 650.0000 mg | ORAL_TABLET | ORAL | Status: DC | PRN

## 2021-12-20 MED ORDER — FENTANYL CITRATE (PF) 100 MCG/2ML IJ SOLN
25.0000 ug | INTRAMUSCULAR | Status: DC | PRN
  Administered 2021-12-20: 50 ug via INTRAVENOUS

## 2021-12-20 MED ORDER — PROPOFOL 1000 MG/100ML IV EMUL
INTRAVENOUS | Status: AC
  Filled 2021-12-20: qty 100

## 2021-12-20 MED ORDER — HYDROGEN PEROXIDE 3 % EX SOLN
CUTANEOUS | Status: DC | PRN
  Administered 2021-12-20: 1

## 2021-12-20 MED ORDER — DEXMEDETOMIDINE HCL IN NACL 200 MCG/50ML IV SOLN
INTRAVENOUS | Status: DC | PRN
  Administered 2021-12-20: 12 ug via INTRAVENOUS
  Administered 2021-12-20: 8 ug via INTRAVENOUS

## 2021-12-20 MED ORDER — PHENYLEPHRINE 80 MCG/ML (10ML) SYRINGE FOR IV PUSH (FOR BLOOD PRESSURE SUPPORT)
PREFILLED_SYRINGE | INTRAVENOUS | Status: AC
  Filled 2021-12-20: qty 10

## 2021-12-20 MED ORDER — STERILE WATER FOR IRRIGATION IR SOLN
Status: DC | PRN
  Administered 2021-12-20: 500 mL

## 2021-12-20 MED ORDER — ACETAMINOPHEN 650 MG RE SUPP: 650.0000 mg | RECTAL | Status: DC | PRN

## 2021-12-20 MED ORDER — ONDANSETRON HCL 4 MG/2ML IJ SOLN
4.0000 mg | Freq: Four times a day (QID) | INTRAMUSCULAR | Status: DC | PRN
Start: 2021-12-20 — End: 2021-12-20

## 2021-12-20 MED ORDER — SODIUM CHLORIDE 0.9 % IV SOLN: INTRAVENOUS | Status: DC

## 2021-12-20 MED ORDER — PROPOFOL 10 MG/ML IV BOLUS
INTRAVENOUS | Status: DC | PRN
  Administered 2021-12-20: 20 mg via INTRAVENOUS

## 2021-12-20 MED ORDER — ACETAMINOPHEN 500 MG PO TABS
1000.0000 mg | ORAL_TABLET | Freq: Once | ORAL | Status: AC
  Administered 2021-12-20: 1000 mg via ORAL

## 2021-12-20 MED ORDER — PROPOFOL 500 MG/50ML IV EMUL
INTRAVENOUS | Status: DC | PRN
  Administered 2021-12-20: 75 ug/kg/min via INTRAVENOUS

## 2021-12-20 MED ORDER — LIDOCAINE HCL (PF) 1 % IJ SOLN
INTRAMUSCULAR | Status: DC | PRN
  Administered 2021-12-20: 19 mL via INTRAMUSCULAR

## 2021-12-20 MED ORDER — MIDAZOLAM HCL 2 MG/2ML IJ SOLN
INTRAMUSCULAR | Status: DC | PRN
  Administered 2021-12-20: 2 mg via INTRAVENOUS

## 2021-12-20 MED ORDER — PHENOL 1.4 % MT LIQD: 1.0000 | OROMUCOSAL | Status: DC | PRN

## 2021-12-20 MED ORDER — EPHEDRINE SULFATE (PRESSORS) 50 MG/ML IJ SOLN
INTRAMUSCULAR | Status: DC | PRN
  Administered 2021-12-20 (×2): 5 mg via INTRAVENOUS
  Administered 2021-12-20: 7.5 mg via INTRAVENOUS
  Administered 2021-12-20: 5 mg via INTRAVENOUS

## 2021-12-20 MED ORDER — CHLORHEXIDINE GLUCONATE 0.12 % MT SOLN
OROMUCOSAL | Status: AC
  Administered 2021-12-20: 15 mL via OROMUCOSAL
  Filled 2021-12-20: qty 15

## 2021-12-20 MED ORDER — VANCOMYCIN HCL IN DEXTROSE 1-5 GM/200ML-% IV SOLN
INTRAVENOUS | Status: AC
  Administered 2021-12-20: 1000 mg via INTRAVENOUS
  Filled 2021-12-20: qty 200

## 2021-12-20 MED ORDER — ACETAMINOPHEN 10 MG/ML IV SOLN: 1000.0000 mg | Freq: Once | INTRAVENOUS | Status: DC | PRN

## 2021-12-20 MED ORDER — LIDOCAINE HCL (PF) 1 % IJ SOLN
INTRAMUSCULAR | Status: AC
  Filled 2021-12-20: qty 30

## 2021-12-20 MED ORDER — CHLORHEXIDINE GLUCONATE 0.12 % MT SOLN: 15.0000 mL | Freq: Once | OROMUCOSAL | Status: AC

## 2021-12-20 MED ORDER — FAMOTIDINE 20 MG PO TABS: 20.0000 mg | ORAL_TABLET | Freq: Once | ORAL | Status: DC

## 2021-12-20 MED ORDER — VASOPRESSIN 20 UNIT/ML IV SOLN
INTRAVENOUS | Status: AC
  Filled 2021-12-20: qty 1

## 2021-12-20 MED ORDER — PHENYLEPHRINE HCL (PRESSORS) 10 MG/ML IV SOLN
INTRAVENOUS | Status: DC | PRN
  Administered 2021-12-20 (×3): 80 ug via INTRAVENOUS
  Administered 2021-12-20: 160 ug via INTRAVENOUS
  Administered 2021-12-20 (×3): 80 ug via INTRAVENOUS

## 2021-12-20 MED ORDER — PROMETHAZINE HCL 25 MG/ML IJ SOLN: 6.2500 mg | INTRAMUSCULAR | Status: DC | PRN

## 2021-12-20 MED ORDER — OXYCODONE HCL 5 MG/5ML PO SOLN: 5.0000 mg | Freq: Once | ORAL | Status: DC | PRN

## 2021-12-20 SURGICAL SUPPLY — 43 items
BINDER ABDOMINAL 12 ML 46-62 (SOFTGOODS) ×2 IMPLANT
BLADE SURG 15 STRL LF DISP TIS (BLADE) ×1 IMPLANT
BLADE SURG 15 STRL SS (BLADE) ×1
CNTNR SPEC 2.5X3XGRAD LEK (MISCELLANEOUS) ×1
CONT SPEC 4OZ STER OR WHT (MISCELLANEOUS) ×1
CONTAINER SPEC 2.5X3XGRAD LEK (MISCELLANEOUS) ×1 IMPLANT
COVER LIGHT HANDLE STERIS (MISCELLANEOUS) ×2 IMPLANT
DRAPE C-ARM XRAY 36X54 (DRAPES) ×2 IMPLANT
DRAPE INCISE IOBAN 66X45 STRL (DRAPES) ×2 IMPLANT
DRSG TEGADERM 4X10 (GAUZE/BANDAGES/DRESSINGS) ×1 IMPLANT
DRSG TEGADERM 4X4.75 (GAUZE/BANDAGES/DRESSINGS) ×4 IMPLANT
DRSG TEGADERM 8X12 (GAUZE/BANDAGES/DRESSINGS) ×1 IMPLANT
DRSG TELFA 3X8 NADH (GAUZE/BANDAGES/DRESSINGS) ×2 IMPLANT
DURAPREP 26ML APPLICATOR (WOUND CARE) ×2 IMPLANT
ELECT REM PT RETURN 9FT ADLT (ELECTROSURGICAL) ×2
ELECTRODE REM PT RTRN 9FT ADLT (ELECTROSURGICAL) ×1 IMPLANT
GAUZE 4X4 16PLY ~~LOC~~+RFID DBL (SPONGE) ×2 IMPLANT
GLOVE BIO SURGEON STRL SZ8 (GLOVE) ×2 IMPLANT
GLOVE SURG XRAY 8.5 LX (GLOVE) ×2 IMPLANT
GOWN STRL REUS W/ TWL LRG LVL3 (GOWN DISPOSABLE) ×1 IMPLANT
GOWN STRL REUS W/ TWL XL LVL3 (GOWN DISPOSABLE) ×1 IMPLANT
GOWN STRL REUS W/TWL LRG LVL3 (GOWN DISPOSABLE) ×1
GOWN STRL REUS W/TWL XL LVL3 (GOWN DISPOSABLE) ×1
KIT TURNOVER KIT A (KITS) ×2 IMPLANT
LABEL OR SOLS (LABEL) ×2 IMPLANT
MANIFOLD NEPTUNE II (INSTRUMENTS) ×2 IMPLANT
NDL HYPO 25X1 1.5 SAFETY (NEEDLE) ×1 IMPLANT
NDL SPNL 22GX3.5 QUINCKE BK (NEEDLE) ×1 IMPLANT
NEEDLE HYPO 25X1 1.5 SAFETY (NEEDLE) ×2 IMPLANT
NEEDLE SPNL 22GX3.5 QUINCKE BK (NEEDLE) ×2 IMPLANT
PACK BASIN MINOR ARMC (MISCELLANEOUS) ×2 IMPLANT
PACK UNIVERSAL (MISCELLANEOUS) ×2 IMPLANT
PAD DRESSING TELFA 3X8 NADH (GAUZE/BANDAGES/DRESSINGS) ×2 IMPLANT
PENCIL SMOKE EVACUATOR (MISCELLANEOUS) ×1 IMPLANT
SET SPINAL AND EPIDURAL ANES (NEEDLE) ×2 IMPLANT
SOL PREP PVP 2OZ (MISCELLANEOUS) ×2
SOLUTION PREP PVP 2OZ (MISCELLANEOUS) ×1 IMPLANT
STAPLER SKIN PROX 35W (STAPLE) ×2 IMPLANT
SUT SILK 0 SH 30 (SUTURE) ×2 IMPLANT
SUT VIC AB 2-0 SH 27 (SUTURE) ×2
SUT VIC AB 2-0 SH 27XBRD (SUTURE) ×2 IMPLANT
SYR 10ML LL (SYRINGE) ×4 IMPLANT
WATER STERILE IRR 500ML POUR (IV SOLUTION) ×2 IMPLANT

## 2021-12-20 NOTE — Brief Op Note (Addendum)
Patient: [name]  (76 y.o. male)  Date: 12/20/2021  Time: 4:18 PM  Diagnosis Pre-op: BACK PAIN Post-op: BACK PAIN Procedure: PAIN PUMP REMOVAL: 10960 (CPT) Operating Room: [facility] OR ROOM 08  Surgeon: Milinda Pointer, MD   Assistant(s): None  Anesthesia: Monitor Anesthesia Care  Anesthesia Staff:  Anesthesiologist: Iran Ouch, MD CRNA: Lia Foyer, CRNA; Rolla Plate, CRNA EBL: Minimal  Blood Administered: None Drains: None Meds ordered this encounter  Medications   vancomycin (VANCOCIN) IVPB 1000 mg/200 mL premix    Order Specific Question:   Indication:    Answer:   Surgical Prophylaxis   DISCONTD: lidocaine (XYLOCAINE) 2 % (with pres) injection 400 mg   acetaminophen (TYLENOL) tablet 1,000 mg   OR Linked Order Group    chlorhexidine (PERIDEX) 0.12 % solution 15 mL    MEDLINE mouth rinse   DISCONTD: 0.9 %  sodium chloride infusion   DISCONTD: famotidine (PEPCID) tablet 20 mg   chlorhexidine (PERIDEX) 0.12 % solution    Hopkins, Erika W: cabinet override   vancomycin (VANCOCIN) 1-5 GM/200ML-% IVPB    Hopkins, Erika W: cabinet override   DISCONTD: acetaminophen (TYLENOL) 500 MG tablet    Hopkins, Erika W: cabinet override   DISCONTD: oxyCODONE (Oxy IR/ROXICODONE) immediate release tablet 5 mg   DISCONTD: oxyCODONE (ROXICODONE) 5 MG/5ML solution 5 mg   DISCONTD: fentaNYL (SUBLIMAZE) injection 25-50 mcg   DISCONTD: acetaminophen (OFIRMEV) IV 1,000 mg    Order Specific Question:   IV acetaminophen for PACU patients (adjunct analgesic)    Answer:   Yes   DISCONTD: promethazine (PHENERGAN) injection 6.25 mg   DISCONTD: sterile water for irrigation for irrigation   DISCONTD: bupivacaine(PF) (MARCAINE) 0.5 % 30 mL, lidocaine (PF) (XYLOCAINE) 1 % 30 mL   DISCONTD: acetaminophen (TYLENOL) tablet 650 mg   DISCONTD: acetaminophen (TYLENOL) suppository 650 mg   DISCONTD: ondansetron (ZOFRAN) tablet 4 mg   DISCONTD: ondansetron (ZOFRAN) injection 4 mg    DISCONTD: menthol-cetylpyridinium (CEPACOL) lozenge 3 mg   DISCONTD: phenol (CHLORASEPTIC) mouth spray 1 spray   DISCONTD: hydrogen peroxide 3 % external solution   DISCONTD: bacitracin ointment   cephALEXin (KEFLEX) 500 MG capsule    Sig: Take 1 capsule (500 mg total) by mouth 3 (three) times daily for 7 days.    Dispense:  21 capsule    Refill:  0    Do not place medication on "Automatic Refill".   fentaNYL (SUBLIMAZE) 100 MCG/2ML injection    Apple, Jeanina V: cabinet override   fentaNYL (SUBLIMAZE) 100 MCG/2ML injection    Apple, Jeanina V: cabinet override   Specimen & disposition: * No specimens in log * Complete Count: YES Tourniquet: * No tourniquets in log *  Dictation: Note written in EPIC Plan of Care: Discharge to 01-Home or Self Care after PACU  Patient Disposition: F/U at Pain Clinic as outpatient. Implants     No active implants to display in this view.

## 2021-12-20 NOTE — Discharge Instructions (Signed)

## 2021-12-20 NOTE — Anesthesia Preprocedure Evaluation (Addendum)
Anesthesia Evaluation  Patient identified by MRN, date of birth, ID band Patient awake    Reviewed: Allergy & Precautions, NPO status , Patient's Chart, lab work & pertinent test results, reviewed documented beta blocker date and time   Airway Mallampati: III  TM Distance: >3 FB     Dental  (+) Partial Upper, Implants   Pulmonary asthma , sleep apnea , COPD, Current Smoker,  Chronic right diaphragm paralysis   Pulmonary exam normal        Cardiovascular Exercise Tolerance: Poor hypertension, Pt. on medications and Pt. on home beta blockers + CAD, + Cardiac Stents (2006), + Peripheral Vascular Disease and +CHF  Past MI: 11/1998.  + dysrhythmias (h/o afib-  rate/rhythm maintained without pharmacological intervention; not currently on chronic anticoagulation therapy)  Rhythm:Irregular Rate:Abnormal - Systolic murmurs, - Diastolic murmurs and - Peripheral Edema    Neuro/Psych PSYCHIATRIC DISORDERS Anxiety Depression    GI/Hepatic GERD  Controlled,S/p nissen fundoplication   Endo/Other  diabetes, Well Controlled, Type 2Hypothyroidism   Renal/GU negative Renal ROS     Musculoskeletal  (+) Arthritis ,   Abdominal Normal abdominal exam  (+)   Peds  Hematology   Anesthesia Other Findings Has intratheal pump. Uses O2 at night.  1. Systolic congestive heart failure "Chronic dyspnea due to his right diaphragm paralysis, chronic peripheral edema without worsening in which he attributes to his diabetes and arthritis.  Cardiovascular Diagnostic Studies:  Echocardiogram 2D complete: (07/30/2019) INTERPRETATION MODERATE LV SYSTOLIC DYSFUNCTION (See above) NORMAL RIGHT VENTRICULAR SYSTOLIC FUNCTION MILD VALVULAR REGURGITATION (See above) NO VALVULAR STENOSIS EF=35-40 %  -Patient with low extremity pain/weakness/balance problems which limit his ambulation. He uses a Administrator, arts at all times. Denies CP with activity.  2.  Chronic obstructive pulmonary disease No recent exacerbation.  Of note patient also with h/o paralyzed right diaphragm  3. Paroxysmal atrial fibrillation   4. Coronary artery disease CAD, STEMI s/p PCI/DES to proximal LAD (2006). Denies CP with activity.  NM Myocardial Perfusion SPECT multiple (stress and rest): (01/14/2018) IMPRESSION: Abnormal myocardial perfusion scan left ventricular enlargement moderately depressed left ventricular function with ejection fraction around 40% patient has evidence of inferior defect persistent consistent with possible scar no clear evidence of reversible ischemia recommend medical management  5. Diabetes mellitus type 2 DM: Diet controlled.   6. Hypertension, unspecified type CV:  Followed regularly by cardiologist. Denies cardiac signs and symptoms. ambulatory with a cane or a walker at baseline.   7. Pulm: + tobacco abuse.   8. CKD  9. GER  10. Chronic back pain. Patient with intra-thecal pump (possibly not currently running-in right abdomen).    Reproductive/Obstetrics                           Anesthesia Physical  Anesthesia Plan  ASA: III  Anesthesia Plan: MAC   Post-op Pain Management: Tylenol PO (pre-op)*, Regional block* and Minimal or no pain anticipated   Induction: Intravenous  PONV Risk Score and Plan: 0 and TIVA  Airway Management Planned: Natural Airway and Nasal Cannula  Additional Equipment:   Intra-op Plan:   Post-operative Plan:   Informed Consent: I have reviewed the patients History and Physical, chart, labs and discussed the procedure including the risks, benefits and alternatives for the proposed anesthesia with the patient or authorized representative who has indicated his/her understanding and acceptance.     Dental advisory given  Plan Discussed with: CRNA  Anesthesia Plan Comments:  Anesthesia Quick Evaluation  

## 2021-12-20 NOTE — Transfer of Care (Signed)
Immediate Anesthesia Transfer of Care Note  Patient: [name]  Procedure(s) Performed: PAIN PUMP REMOVAL  Patient Location: PACU  Anesthesia Type:General  Level of Consciousness: drowsy  Airway & Oxygen Therapy: Patient Spontanous Breathing and Patient connected to face mask oxygen  Post-op Assessment: Report given to RN and Post -op Vital signs reviewed and stable  Post vital signs: Reviewed and stable  Last Vitals: see Epic Flowsheets Vitals Value Taken Time  BP    Temp    Pulse    Resp    SpO2      Last Pain:  Vitals:   12/20/21 1027  TempSrc: Temporal  PainSc: 8          Complications: No notable events documented.

## 2021-12-20 NOTE — Interval H&P Note (Signed)
Patient's Name: [name]  MRN: [id number]  Referring Provider: No ref. provider found  DOB: Apr 05, 1946  PCP: Adin Hector, MD  DOS: 10/22/2021  Note by: [name], MD  Service setting: Outpatient Same Day Surgery  Specialty: Interventional Pain Management  Patient type: Established  Location: [facility]  Encounter type: Pre-operative Evaluation   Admission Date & Time: 12/20/2021  9:53 AM  History and Physical Interval Note:  Mr. DEMICHAEL TRAUM has presented today for surgery, with the diagnosis of : Mechanical breakdown of spinal infusion catheter.  I have reviewed the patient's chart, labs, images, history and physical exam. No new additional findings. No change in status. Mr. Mascio is stable for surgery. The various alternatives of treatment have been discussed with the patient and family. The patient has been informed of the risks and possible complications. After consideration of risks, benefits and other options for treatment, the patient has consented to Procedure(s): PAIN PUMP REMOVAL: 58309 (CPT), as a surgical intervention . All questions have been answered to the patient's satisfaction.  Plan: Proceed with surgery as planned.  OR Scheduled Time: 1237  Note by: [name], MD Date: 10/22/2021; Time: 11:35 AM

## 2021-12-21 ENCOUNTER — Encounter: Payer: Self-pay | Admitting: Pain Medicine

## 2021-12-21 NOTE — Anesthesia Postprocedure Evaluation (Signed)
Anesthesia Post Note  Patient: [name]  Procedure(s) Performed: PAIN PUMP REMOVAL  Patient location during evaluation: PACU Anesthesia Type: MAC Level of consciousness: awake and alert Pain management: pain level controlled Vital Signs Assessment: post-procedure vital signs reviewed and stable Respiratory status: spontaneous breathing, nonlabored ventilation and respiratory function stable Cardiovascular status: blood pressure returned to baseline and stable Postop Assessment: no apparent nausea or vomiting Anesthetic complications: no   No notable events documented.   Last Vitals:  Vitals:   12/20/21 1445 12/20/21 1503  BP: (!) 160/84 (!) 162/97  Pulse: 66 69  Resp: 13 18  Temp: (!) 36.3 C 36.4 C  SpO2: 93% 93%    Last Pain:  Vitals:   12/20/21 1503  TempSrc: Temporal  PainSc: [place]

## 2021-12-30 NOTE — Progress Notes (Unsigned)
PROVIDER NOTE: Information contained herein reflects review and annotations entered in association with encounter. Interpretation of such information and data should be left to medically-trained personnel. Information provided to patient can be located elsewhere in the medical record under "Patient Instructions". Document created using STT-dictation technology, any transcriptional errors that may result from process are unintentional.    Patient: [name]  Service Category: E/M  Provider: [name], MD  DOB: 17-Apr-1946  DOS: 12/31/2021  Specialty: Interventional Pain Management  MRN: [id number]  Setting: Ambulatory outpatient  PCP: Adin Hector, MD  Type: Established Patient    Referring Provider: Adin Hector, MD  Location: Office  Delivery: Face-to-face     HPI  [name], a 76 y.o. year old male, is here today because of his No primary diagnosis found.. [name] primary complain today is No chief complaint on file. Last encounter: My last encounter with him was on 11/26/2021. Pertinent problems: 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[place]); Chronic ankle pain (Bilateral); Pain in joint, ankle and foot; Polyneuropathy associated with underlying disease ([place]); Presence of intrathecal pump; Lumbosacral radiculopathy at S1 (Bilateral) (R>L); Chronic feet pain (3ry area of Pain) (Bilateral) (R>L); Chronic knee pain (4th area of Pain) (Bilateral) (R>L); Failed back surgical syndrome; Lower extremity weakness (Bilateral); Chronic neuropathic pain; Peripheral neurogenic pain; Nonfamilial nocturnal leg cramps (Bilateral); Restless leg syndrome; Malfunction of intrathecal infusion pump; End of battery life of  intrathecal infusion pump; Pain in right knee; DDD (degenerative disc disease), lumbosacral; Abnormal MRI, lumbar spine (11/01/2019); 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 Pain Assessment: Severity of   is reported as a  /10. Location:    / . Onset:  . Quality:  . Timing:  . Modifying factor(s):  Marland Kitchen Vitals:  vitals were not taken for this visit.   Reason for encounter:  *** . ***  Pharmacotherapy Assessment  Analgesic: Hydromorphone 4 mg tablet, 1 tab p.o. 3 times daily (last filled on 04/02/2021) MME/day: 0 mg/day    Monitoring:  PMP: PDMP reviewed during this encounter.       Pharmacotherapy: No side-effects or adverse reactions reported. Compliance: No problems identified. Effectiveness: Clinically acceptable.  No notes on file  UDS:  Summary  Date Value Ref Range Status  09/19/2021 Note  Final    Comment:    ==================================================================== [employer] Select 13 (MW) ==================================================================== Test                             Result       Flag       Units  Drug Present and Declared for Prescription Verification   Hydromorphone                  1862         EXPECTED   ng/mg creat    Hydromorphone may be  administered as a scheduled prescription    medication; it is also an expected metabolite of  hydrocodone.  ==================================================================== Test                      Result    Flag   Units      Ref Range   Creatinine              26               mg/dL      >=20 ==================================================================== Declared Medications:  The flagging and interpretation on this report are based on the  following declared medications.  Unexpected results may arise from  inaccuracies in the declared medications.   **Note: The testing scope of this panel includes these medications:   Hydromorphone (Dilaudid)   **Note: The testing scope of this panel does not include the  following reported medications:   Albuterol (Proair HFA)  Amitriptyline (Elavil)  Azelastine  Bisacodyl  Clonidine (Catapres)  Duloxetine (Cymbalta)  Eye Drop  Levothyroxine (Synthroid)  Losartan (Cozaar)  Multivitamin  Naloxone (Narcan)  Olodaterol  Pregabalin (Lyrica)  Promethazine (Phenergan)  Simvastatin (Zocor)  Tiotropium ==================================================================== For clinical consultation, please call [phone]. ====================================================================      ROS  Constitutional: Denies any fever or chills Gastrointestinal: No reported hemesis, hematochezia, vomiting, or acute GI distress Musculoskeletal: Denies any acute onset joint swelling, redness, loss of ROM, or weakness Neurological: No reported episodes of acute onset apraxia, aphasia, dysarthria, agnosia, amnesia, paralysis, loss of coordination, or loss of consciousness  Medication Review  DULoxetine, HYDROmorphone, albuterol, alfuzosin, amitriptyline, aspirin EC, azelastine, bisacodyl, cephALEXin, cloNIDine, latanoprost, levothyroxine, loratadine, losartan, methocarbamol, naloxone, pregabalin, promethazine, senna-docusate, and simvastatin  History Review  Allergy: [name] is allergic to celebrex [celecoxib], codeine, and  penicillins. Drug: [name]  reports no history of drug use. Alcohol:  reports no history of alcohol use. Tobacco:  reports that he has been smoking cigarettes. He has a 44.25 pack-year smoking history. He has never used smokeless tobacco. Social: [name]  reports that he has been smoking cigarettes. He has a 44.25 pack-year smoking history. He has never used smokeless tobacco. He reports that he does not drink alcohol and does not use drugs. Medical:  has a past medical history of Acute anterolateral wall MI ([place]) (2000), Acute non-Q wave non-ST elevation myocardial infarction (NSTEMI) ([place]) (12/03/2004), Adenomatous polyp of colon, Agent orange exposure, Angina pectoris ([place]), Anxiety, Aortic atherosclerosis ([place]), Asthma, Bladder stones, Bradycardia, CHF (congestive heart failure) ([place]), Chronic lower back pain, Chronic pain syndrome, COPD (chronic obstructive pulmonary disease) ([place]), Coronary artery disease, Depression, DJD (degenerative joint disease), DVT of lower extremity, bilateral ([place]), Dyspnea, GERD (gastroesophageal reflux disease), History of hiatal hernia, HLD (hyperlipidemia), Hypertension, Hypothyroidism, Inflammatory polyarthropathy ([place]), Internal hemorrhoids, Long term prescription opiate use, 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 Surgical: [name]  has a past surgical history that includes Coronary angioplasty; Posterior lumbar fusion (N/A, 1999); Nissen fundoplication (N/A, 1638); Mastoidectomy; Partial  colectomy (N/A); Intrathecal pump implantation (N/A, 03/2008); Intrathecal pump revision (N/A, 09/28/2014); Transurethral resection of bladder tumor (N/A, 03/07/2015); Colonoscopy with propofol (N/A, 02/27/2018); External ear surgery; Skin  cancer excision; IVC FILTER PLACEMENT ([facility]) (N/A, 09/2007); IVC FILTER REMOVAL (N/A, 2011); Colonoscopy (N/A, 11/22/2013); Colonoscopy (N/A, 09/19/2008); Colonoscopy (N/A, 08/25/2003); Colonoscopy (N/A, 10/15/1994); Posterior laminectomy / decompression cervical spine (N/A, 1984); Posterior laminectomy / decompression cervical spine (N/A, 1985); Tonsillectomy (Bilateral, 1958); Coronary angioplasty with stent (Left, 12/04/2004); LEFT HEART CATH AND CORONARY ANGIOGRAPHY (Left, 02/19/2008); LEFT HEART CATH AND CORONARY ANGIOGRAPHY (Left, 04/18/1987); LEFT HEART CATH AND CORONARY ANGIOGRAPHY (Left, 02/03/2002); and Pain pump removal (N/A, 12/20/2021). Family: family history includes Emphysema in his father and sister; Heart attack in his father; Heart disease in his brother and mother.  Laboratory Chemistry Profile   Renal Lab Results  Component Value Date   BUN 21 11/01/2021   CREATININE 0.97 11/01/2021   BCR 11 02/07/2021   GFRAA >60 03/02/2015   GFRNONAA >60 11/01/2021    Hepatic Lab Results  Component Value Date   AST 18 02/07/2021   ALBUMIN 4.2 02/07/2021   ALKPHOS 123 (H) 02/07/2021    Electrolytes Lab Results  Component Value Date   NA 141 11/01/2021   K 4.1 11/01/2021   CL 105 11/01/2021   CALCIUM 8.8 (L) 11/01/2021   MG 2.3 11/01/2021   PHOS 3.7 11/01/2021    Bone Lab Results  Component Value Date   25OHVITD1 22 (L) 02/07/2021   25OHVITD2 <1.0 02/07/2021   25OHVITD3 22 02/07/2021    Inflammation (CRP: Acute Phase) (ESR: Chronic Phase) Lab Results  Component Value Date   CRP 3 02/07/2021   ESRSEDRATE 6 02/07/2021   LATICACIDVEN 0.9 10/28/2021         Note: Above Lab results reviewed.  Recent Imaging Review  DG C-Arm 1-60  Min-No Report Fluoroscopy was utilized by the requesting physician.  No radiographic  interpretation.  Note: Reviewed        Physical Exam  General appearance: Well nourished, well developed, and well hydrated. In no apparent acute distress Mental status: Alert, oriented x 3 (person, place, & time)       Respiratory: No evidence of acute respiratory distress Eyes: PERLA Vitals: There were no vitals taken for this visit. BMI: Estimated body mass index is 26.96 kg/m as calculated from the following:   Height as of 12/20/21: _0  (1.88 m).   Weight as of 12/20/21: 210 lb (95.3 kg). Ideal: Ideal body weight: 82.2 kg (181 lb 3.5 oz) Adjusted ideal body weight: 87.4 kg (192 lb 11.7 oz)  Assessment   Diagnosis Status  No diagnosis found. Controlled Controlled Controlled   Updated Problems: No problems updated.  Plan of Care  Problem-specific:  No problem-specific Assessment & Plan notes found for this encounter.  [name] has a current medication list which includes the following long-term medication(s): albuterol, amitriptyline, azelastine, cephalexin, duloxetine, hydromorphone, [START ON 01/27/2022] hydromorphone, [START ON 02/26/2022] hydromorphone, levothyroxine, loratadine, losartan, pregabalin, [START ON 01/15/2022] pregabalin, and simvastatin.  Pharmacotherapy (Medications Ordered): No orders of the defined types were placed in this encounter.  Orders:  No orders of the defined types were placed in this encounter.  Follow-up plan:   No follow-ups on file.     Interventional Therapies  Risk  Complexity Considerations:   Estimated body mass index is 27.6 kg/m as calculated from the following:   Height as of this encounter: _1  (1.88 m).   Weight as of this encounter: 215 lb (97.5 kg). WNL   Planned  Pending:   Intrathecal catheter and pump removal (2ry to end of battery life & catheter malfunction)  Under consideration:   Diagnostic intrathecal pump  catheter test/evaluation  Diagnostic/therapeutic bilateral lumbar facet MBB #1  Therapeutic/palliative left L4-5 LESI #1    Completed:   Diagnostic left caudal ESI and epidurogram x1 (04/24/2021) (LBP: 50/50/0  LEP; 100/100/50-60)  Diagnostic intrathecal pump catheter test/evaluation x1 (06/21/2021) (Dx: Catheter malfunction/dysfunction)  Referral (02/07/2021 & 09/19/2021) to [facility] neurosurgery Deetta Perla, MD) for ITP replacement. Cardiology clearance to Dr. Clayborn Bigness requested (09/19/2021)    Therapeutic  Palliative (PRN) options:   None established     Recent Visits Date Type Provider Dept  11/26/21 Office Visit Milinda Pointer, MD [facility]  10/17/21 Office Visit Milinda Pointer, MD [facility]  Showing recent visits within past 90 days and meeting all other requirements Future Appointments Date Type Provider Dept  12/31/21 Appointment Milinda Pointer, MD [facility]  03/20/22 Appointment Milinda Pointer, MD [facility]  Showing future appointments within next 90 days and meeting all other requirements  I discussed the assessment and treatment plan with the patient. The patient was provided an opportunity to ask questions and all were answered. The patient agreed with the plan and demonstrated an understanding of the instructions.  Patient advised to call back or seek an in-person evaluation if the symptoms or condition worsens.  Duration of encounter: *** minutes.  Note by: [name], MD Date: 12/31/2021; Time: 7:48 AM

## 2021-12-31 ENCOUNTER — Encounter: Payer: Self-pay | Admitting: Pain Medicine

## 2021-12-31 ENCOUNTER — Ambulatory Visit: Payer: Medicare (Managed Care) | Attending: Pain Medicine | Admitting: Pain Medicine

## 2021-12-31 VITALS — BP 142/85 | HR 69 | Temp 97.7°F | Ht 75.0 in | Wt 210.0 lb

## 2021-12-31 DIAGNOSIS — G8929 Other chronic pain: Secondary | ICD-10-CM | POA: Insufficient documentation

## 2021-12-31 DIAGNOSIS — Z79891 Long term (current) use of opiate analgesic: Secondary | ICD-10-CM

## 2021-12-31 DIAGNOSIS — M25562 Pain in left knee: Secondary | ICD-10-CM | POA: Diagnosis present

## 2021-12-31 DIAGNOSIS — M5441 Lumbago with sciatica, right side: Secondary | ICD-10-CM | POA: Diagnosis not present

## 2021-12-31 DIAGNOSIS — Z462 Encounter for fitting and adjustment of other devices related to nervous system and special senses: Secondary | ICD-10-CM | POA: Diagnosis present

## 2021-12-31 DIAGNOSIS — Z79899 Other long term (current) drug therapy: Secondary | ICD-10-CM | POA: Diagnosis present

## 2021-12-31 DIAGNOSIS — Z4802 Encounter for removal of sutures: Secondary | ICD-10-CM | POA: Diagnosis present

## 2021-12-31 DIAGNOSIS — M79605 Pain in left leg: Secondary | ICD-10-CM | POA: Insufficient documentation

## 2021-12-31 DIAGNOSIS — Z4889 Encounter for other specified surgical aftercare: Secondary | ICD-10-CM | POA: Insufficient documentation

## 2021-12-31 DIAGNOSIS — Z09 Encounter for follow-up examination after completed treatment for conditions other than malignant neoplasm: Secondary | ICD-10-CM | POA: Diagnosis not present

## 2021-12-31 DIAGNOSIS — M79604 Pain in right leg: Secondary | ICD-10-CM

## 2021-12-31 DIAGNOSIS — M79671 Pain in right foot: Secondary | ICD-10-CM | POA: Diagnosis present

## 2021-12-31 DIAGNOSIS — T85610S Breakdown (mechanical) of epidural and subdural infusion catheter, sequela: Secondary | ICD-10-CM | POA: Diagnosis not present

## 2021-12-31 DIAGNOSIS — M79672 Pain in left foot: Secondary | ICD-10-CM | POA: Diagnosis present

## 2021-12-31 DIAGNOSIS — M25561 Pain in right knee: Secondary | ICD-10-CM | POA: Diagnosis present

## 2021-12-31 DIAGNOSIS — M5442 Lumbago with sciatica, left side: Secondary | ICD-10-CM | POA: Insufficient documentation

## 2021-12-31 NOTE — Progress Notes (Signed)
Safety precautions to be maintained throughout the outpatient stay will include: orient to surroundings, keep bed in low position, maintain call bell within reach at all times, provide assistance with transfer out of bed and ambulation.  

## 2021-12-31 NOTE — Patient Instructions (Signed)
PRN appt.

## 2022-01-18 ENCOUNTER — Other Ambulatory Visit: Payer: Self-pay | Admitting: Internal Medicine

## 2022-01-18 DIAGNOSIS — I208 Other forms of angina pectoris: Secondary | ICD-10-CM

## 2022-01-18 DIAGNOSIS — I2089 Other forms of angina pectoris: Secondary | ICD-10-CM

## 2022-01-21 ENCOUNTER — Telehealth (HOSPITAL_COMMUNITY): Payer: Self-pay | Admitting: *Deleted

## 2022-01-21 NOTE — Telephone Encounter (Signed)
Attempted to call patient regarding upcoming cardiac CT appointment. °Left message on voicemail with name and callback number ° °Shirin Echeverry RN Navigator Cardiac Imaging °[facility] °[phone] Office °[phone] Cell ° °

## 2022-01-23 ENCOUNTER — Encounter (HOSPITAL_COMMUNITY): Payer: Self-pay

## 2022-01-23 ENCOUNTER — Other Ambulatory Visit (HOSPITAL_COMMUNITY): Payer: Self-pay | Admitting: *Deleted

## 2022-01-23 ENCOUNTER — Encounter: Payer: Medicare (Managed Care) | Admitting: Pain Medicine

## 2022-01-23 ENCOUNTER — Other Ambulatory Visit: Payer: Self-pay | Admitting: Pain Medicine

## 2022-01-23 MED ORDER — METOPROLOL TARTRATE 100 MG PO TABS: ORAL_TABLET | ORAL | 0 refills | Status: DC

## 2022-01-23 NOTE — Procedures (Addendum)
PROVIDER NOTE: Information contained herein reflects review and annotations entered in association with encounter. Interpretation of such information and data should be left to medically-trained personnel. Information provided to patient can be located elsewhere in the medical record under "Patient Instructions". Document created using STT-dictation technology, any transcriptional errors that may result from process are unintentional.    Patient: [name]  Service Category: Surgery  Provider: [name], MD  DOB: [birth year]  DOS: 10/22/2021  Location: [place]  MRN: [id number]  Setting: Ambulatory - outpatient  Referring Provider: No ref. provider found  Type: Established Patient  Specialty: Interventional Pain Management  PCP: Adin Hector, MD   Operative Note    Planned intervention: Surgical removal of intrathecal pump/reservoir and catheter. Pre-op Dx: Mechanical breakdown of spinal infusion catheter Procedure: PAIN PUMP REMOVAL: 24097 (CPT)  Post-op Dx: Mechanical breakdown of spinal infusion catheter Laterality: Right (-RT)  Catheter insertion level: Lumbar  Imaging: N/A. Landmark-guided DOS: 12/20/2021  Time: 4:10 PM Operating Room: [facility] OR ROOM 08  Surgeon: Milinda Pointer, MD  Assistant(s): None Anesthesia: Monitor Anesthesia Care  Anesthesia Staff: Anesthesiologist: Iran Ouch, MD CRNA: Lia Foyer, CRNA; Rolla Plate, CRNA OR Staff: Circulator: Alverda Skeans, RN Radiology Technologist: Alita Chyle, RT Relief Scrub: Cephas Darby Scrub Person: Alben Deeds Circulator Assistant: Vena Rua, Austan Prose, RN EBL: Minimal  Blood Administered: None Drains: None Specimen & disposition: * No specimens in log * Complete Count: YES Tourniquet: * No tourniquets in log *  Performed by: [name], MD    Target: Intrathecal catheter and pump reservoir Catheter Location: Right subcutaneous path  from anterior abdominal region to intraspinal insertion site on lower back region. Pump/reservoir Location: Right abdominal wall. Region: Lumbar/Abdominal  Type of procedure: Surgical   Position / Prep / Materials:  Position: Lateral Decubitus with Pump implant side up  Prep solution: DuraPrep (Iodine Povacrylex [0.7% available iodine] and Isopropyl Alcohol, 74% w/w) Prep Area: Abdominal area over implant, flank, and thoraco-lumbar region  Materials:  Tray: Surgical  Imaging Guidance  Type of Imaging Technique: None required. Indication(s): N/A Exposure Time: Not applicable. Contrast: None required. Fluoroscopic Guidance: Not used or required. Interpretation: Not applicable.  Pre-Procedure Preparation:  Monitoring: As per clinic protocol. Respiration, ETCO2, SpO2, BP, heart rate and rhythm monitor placed and checked for adequate function Safety Precautions: Patient was assessed for positional comfort and pressure points before starting the procedure. Time-out: I initiated and conducted the "Time-out" before starting the procedure, as per protocol. The patient was asked to participate by confirming the accuracy of the "Time Out" information. Verification of the correct person, site, and procedure were performed and confirmed by me, the nursing staff, and the patient. "Time-out" conducted as per Joint Commission's Universal Protocol (UP.01.01.01). Time:    Description/Narrative of Procedure:  Procedural Technique Safety Precautions: Aspiration looking for blood return was conducted prior to all injections. At no point did we inject any substances, as a needle was being advanced. No attempts were made at seeking any paresthesias. Safe injection practices and needle disposal techniques used. Medications properly checked for expiration dates. SDV (single dose vial) medications used. Description of the Procedure: Protocol guidelines were followed. The patient was assisted into a comfortable  position. The target area was identified and the area prepped in the usual manner. Skin & deeper tissues infiltrated with local anesthetic. Appropriate amount of time allowed to pass for local anesthetics to take effect.  Technical description of  procedure:  Once the local anesthetic had taken effect and the patient was provided with sedation, an incision was made in the back, where the intrathecal catheter anchor was palpated.  Surgical dissection was performed to expose the anchor and the catheter.  The catheter was removed from the intrathecal space and a pursestring suture was placed around the connection with the intrathecal canal.  The anchor was removed and separated from the catheter.  A second incision was then made over the pump reservoir exposing the device.  Surgical dissection was performed in order to explant the device from the abdominal pocket.  Both the reservoir and the intrathecal catheter were removed in their entirety.  Both pockets were then irrigated and hemostasis achieved using the cautery.  Both sides of the incision were approximated using Vicryl sutures.  The skin was closed using surgical staples.  Antibiotic ointment was placed over the incision and cover by Telfa.  The incisions were then covered by a nonocclusive transparent dressing (Tegaderm/OpSite).  The patient tolerated the entire procedure well and was then transported to the recovery area for observation by anesthesia.  Medication(s): Medication Name Total Dose  sterile water for irrigation for irrigation 500 mL  bupivacaine(PF) (MARCAINE) 0.5 % 30 mL, lidocaine (PF) (XYLOCAINE) 1 % 30 mL 19 mL  hydrogen peroxide 3 % external solution 1 application   bacitracin ointment 1 application   acetaminophen (TYLENOL) tablet 1,000 mg 1,000 mg  vancomycin (VANCOCIN) IVPB 1000 mg/200 mL premix 200 mL  0.9 %  sodium chloride infusion 59.99 mL    Vitals:   12/20/21 1415 12/20/21 1430 12/20/21 1445 12/20/21 1503  BP: (!)  152/84 (!) 156/100 (!) 160/84 (!) 162/97  Pulse: 70 62 66 69  Resp: '14 17 13 18  '$ Temp:   (!) 97.3 F (36.3 C) 97.6 F (36.4 C)  TempSrc:    Temporal  SpO2: 92% 92% 93% 93%  Weight:      Height:         Scheduled time: 1237 Start time: 12:52 PM Surgical time: 1 Hr 31 Min 0 Sec End time: Incision (Closed) 12/20/21 Abdomen-Dressing Type: Transparent dressing (telfa, bacitracin) Incision (Closed) 12/20/21 Back-Dressing Type: Transparent dressing (telfa, bacitracin)  Implants     No active implants to display in this view.      Post-operative Assessment:  Post-procedure Vital Signs:  Pulse/HCG Rate: 6965 Temp: 97.6 F (36.4 C) Resp: 18 BP:  (!) 162/97 SpO2: 93 %  EBL: None  Complications: No immediate post-treatment complications observed by team, or reported by patient.  Note: The patient tolerated the entire procedure well. A repeat set of vitals were taken after the procedure and the patient was kept under observation following institutional policy, for this type of procedure. Post-procedural neurological assessment was performed, showing return to baseline, prior to discharge. The patient was provided with post-procedure discharge instructions, including a section on how to identify potential problems. Should any problems arise concerning this procedure, the patient was given instructions to immediately contact us, at any time, without hesitation. In any case, we plan to contact the patient by telephone for a follow-up status report regarding this interventional procedure.  Comments:  No additional relevant information.  Plan of Care  Disposition: Discharge home under the care of a responsible, capable, adult driver. Return to clinics in 6-10 days for post-procedure evaluation and removal of staples.  Discharge (Date  Time): 12/20/2021  3:19 PM  Primary Care Physician: Adin Hector, MD Location: [facility] Note by: [name],  MD  Disclaimer:  Medicine is not an exact science. The only guarantee in medicine is that nothing is guaranteed. It is important to note that the decision to proceed with this intervention was based on the information collected from the patient. The Data and conclusions were drawn from the patient's questionnaire, the interview, and the physical examination. Because the information was provided in large part by the patient, it cannot be guaranteed that it has not been purposely or unconsciously manipulated. Every effort has been made to obtain as much relevant data as possible for this evaluation. It is important to note that the conclusions that lead to this procedure are derived in large part from the available data. Always take into account that the treatment will also be dependent on availability of resources and existing treatment guidelines, considered by other Pain Management Practitioners as being common knowledge and practice, at the time of the intervention. For Medico-Legal purposes, it is also important to point out that variation in procedural techniques and pharmacological choices are the acceptable norm. The indications, contraindications, technique, and results of the above procedure should only be interpreted and judged by a Board-Certified Interventional Pain Specialist with extensive familiarity and expertise in the same exact procedure and technique.

## 2022-01-24 ENCOUNTER — Telehealth: Payer: Self-pay | Admitting: Pain Medicine

## 2022-01-24 ENCOUNTER — Telehealth (HOSPITAL_COMMUNITY): Payer: Self-pay | Admitting: *Deleted

## 2022-01-24 ENCOUNTER — Ambulatory Visit: Admission: RE | Admit: 2022-01-24 | Payer: Medicare (Managed Care) | Source: Ambulatory Visit

## 2022-01-24 ENCOUNTER — Ambulatory Visit: Payer: Medicare (Managed Care) | Admitting: Pain Medicine

## 2022-01-24 ENCOUNTER — Encounter: Payer: Medicare (Managed Care) | Admitting: Pain Medicine

## 2022-01-24 NOTE — Telephone Encounter (Signed)
Patient called stated that he is in a lot of pain and the only thing that helps is sitting. Please give patient a call. Thanks

## 2022-01-24 NOTE — Telephone Encounter (Signed)
Needs appt

## 2022-01-24 NOTE — Telephone Encounter (Signed)
Reaching out to the patient and wife answered the phone.  Informed her that after receiving more information and consulting Dr. Garen Lah, the CCTA was not the best test for this patient. Informed her that we would let Dr. Etta Quill office aware. She verbalized understanding.  Gordy Clement RN Navigator Cardiac Imaging [facility] [phone] Office [phone] Cell

## 2022-01-25 ENCOUNTER — Other Ambulatory Visit: Payer: Self-pay

## 2022-01-25 ENCOUNTER — Emergency Department: Payer: Medicare (Managed Care)

## 2022-01-25 ENCOUNTER — Encounter: Payer: Self-pay | Admitting: Emergency Medicine

## 2022-01-25 ENCOUNTER — Emergency Department
Admission: EM | Admit: 2022-01-25 | Discharge: 2022-01-25 | Disposition: A | Discharge status: Home / Self Care | Payer: Medicare (Managed Care) | Attending: Emergency Medicine | Admitting: Emergency Medicine

## 2022-01-25 DIAGNOSIS — I872 Venous insufficiency (chronic) (peripheral): Secondary | ICD-10-CM | POA: Diagnosis not present

## 2022-01-25 DIAGNOSIS — I2510 Atherosclerotic heart disease of native coronary artery without angina pectoris: Secondary | ICD-10-CM | POA: Insufficient documentation

## 2022-01-25 DIAGNOSIS — N200 Calculus of kidney: Secondary | ICD-10-CM | POA: Insufficient documentation

## 2022-01-25 DIAGNOSIS — I509 Heart failure, unspecified: Secondary | ICD-10-CM | POA: Diagnosis not present

## 2022-01-25 DIAGNOSIS — Z87442 Personal history of urinary calculi: Secondary | ICD-10-CM | POA: Diagnosis not present

## 2022-01-25 DIAGNOSIS — G8929 Other chronic pain: Secondary | ICD-10-CM | POA: Diagnosis not present

## 2022-01-25 DIAGNOSIS — J449 Chronic obstructive pulmonary disease, unspecified: Secondary | ICD-10-CM | POA: Insufficient documentation

## 2022-01-25 DIAGNOSIS — M545 Low back pain, unspecified: Secondary | ICD-10-CM | POA: Diagnosis present

## 2022-01-25 DIAGNOSIS — M4316 Spondylolisthesis, lumbar region: Secondary | ICD-10-CM | POA: Insufficient documentation

## 2022-01-25 DIAGNOSIS — J90 Pleural effusion, not elsewhere classified: Secondary | ICD-10-CM | POA: Insufficient documentation

## 2022-01-25 LAB — URINALYSIS, ROUTINE W REFLEX MICROSCOPIC
Bilirubin Urine: NEGATIVE
Glucose, UA: NEGATIVE mg/dL
Hgb urine dipstick: NEGATIVE
Ketones, ur: NEGATIVE mg/dL
Leukocytes,Ua: NEGATIVE
Nitrite: NEGATIVE
Protein, ur: NEGATIVE mg/dL
Specific Gravity, Urine: 1.003 — ABNORMAL LOW (ref 1.005–1.030)
pH: 6 (ref 5.0–8.0)

## 2022-01-25 MED ORDER — HYDROMORPHONE HCL 1 MG/ML IJ SOLN
1.0000 mg | Freq: Once | INTRAMUSCULAR | Status: AC
  Administered 2022-01-25: 1 mg via INTRAMUSCULAR
  Filled 2022-01-25: qty 1

## 2022-01-25 MED ORDER — LIDOCAINE 5 % EX PTCH: 1.0000 | MEDICATED_PATCH | Freq: Two times a day (BID) | CUTANEOUS | 0 refills | Status: DC

## 2022-01-25 MED ORDER — METHOCARBAMOL 500 MG PO TABS
500.0000 mg | ORAL_TABLET | Freq: Once | ORAL | Status: AC
  Administered 2022-01-25: 500 mg via ORAL
  Filled 2022-01-25: qty 1

## 2022-01-25 MED ORDER — LIDOCAINE 5 % EX PTCH
1.0000 | MEDICATED_PATCH | CUTANEOUS | Status: DC
  Administered 2022-01-25: 1 via TRANSDERMAL
  Filled 2022-01-25: qty 1

## 2022-01-25 MED ORDER — ORPHENADRINE CITRATE 30 MG/ML IJ SOLN: 60.0000 mg | Freq: Two times a day (BID) | INTRAMUSCULAR | Status: DC

## 2022-01-25 MED ORDER — TIZANIDINE HCL 4 MG PO TABS: 4.0000 mg | ORAL_TABLET | Freq: Three times a day (TID) | ORAL | 0 refills | Status: AC | PRN

## 2022-01-25 MED ORDER — METHOCARBAMOL 500 MG PO TABS
500.0000 mg | ORAL_TABLET | Freq: Once | ORAL | Status: AC
Start: 2022-01-25 — End: 2022-01-25
  Administered 2022-01-25: 500 mg via ORAL
  Filled 2022-01-25: qty 1

## 2022-01-25 NOTE — ED Triage Notes (Signed)
Patient to ED via POV for lower back pain x 1 month with worsening pain for the past few days. Patient states he is "unable to stand or walk" due to the pain. Hx of back surgery.

## 2022-01-25 NOTE — Discharge Instructions (Addendum)
Continue taking your hydromorphone at home as directed by Dr. Dossie Arbour and call his office if additional pain medication or not improving.  A prescription for Zanaflex 1 tablet every 8 hours as needed for muscle spasms.  Ice or heat to your back as needed for discomfort.  Return to the emergency department if any severe worsening of your back pain such as loss of bowel or bladder control or new onset of weakness to your lower extremities.

## 2022-01-25 NOTE — ED Provider Notes (Signed)
[facility] Provider Note    Event Date/Time   First MD Initiated Contact with Patient 01/25/22 1131     (approximate)   History   Back Pain   HPI  [name] is a 76 y.o. male   presents to the ED with complaint of low back pain for 1 month which has been worsening for the last few days.  Patient has history of chronic low back pain and has been seen by the pain clinic at [facility].  Patient also has history of back surgery.  He also has history of kidney stones but at this time denies any nausea or vomiting.  Patient routinely takes hydromorphone for his back pain and states this is not helping.  Denies any recent injury.  Patient also had an intrathecal infusion pump for his chronic pain which had to be removed recently due to the battery life of his device.  He has an appointment already scheduled with Dr. Dossie Arbour for follow-up of this.  Currently he denies any incontinence of bowel or bladder.  Patient has a history of congestive heart failure, chronic low back pain, chronic venous insufficiency, COPD, degenerative disc disease, RLS, lumbar post laminectomy syndrome.      Physical Exam   Triage Vital Signs: ED Triage Vitals  Enc Vitals Group     BP 01/25/22 1113 (!) 115/103     Pulse Rate 01/25/22 1113 82     Resp 01/25/22 1113 18     Temp 01/25/22 1113 98.5 F (36.9 C)     Temp Source 01/25/22 1113 Oral     SpO2 01/25/22 1113 95 %     Weight 01/25/22 1114 200 lb (90.7 kg)     Height 01/25/22 1114 '6\' 3"'$  (1.905 m)     Head Circumference --      Peak Flow --      Pain Score 01/25/22 1114 9     Pain Loc --      Pain Edu? --      Excl. in [place]? --     Most recent vital signs: Vitals:   01/25/22 1113  BP: (!) 115/103  Pulse: 82  Resp: 18  Temp: 98.5 F (36.9 C)  SpO2: 95%     General: Awake, no distress.  CV:  Good peripheral perfusion.  Heart regular rate and rhythm. Resp:  Normal effort.  Clear bilaterally. Abd:  No distention.   Other:  On examination of the lumbar spine there is no deformity noted however postsurgical scars are well-healed.  There is diffuse tenderness on palpation of the lower lumbar region bilaterally.  Range of motion is difficult for patient without increasing his pain.  Right lower quadrant abdominal region is a well-healed surgical scar due to removal of his implant device for pain control.  No erythema or warmth.  No fluctuance in this area to suggest an abscess.   ED Results / Procedures / Treatments   Labs (all labs ordered are listed, but only abnormal results are displayed) Labs Reviewed  URINALYSIS, ROUTINE W REFLEX MICROSCOPIC - Abnormal; Notable for the following components:      Result Value   Color, Urine STRAW (*)    APPearance CLEAR (*)    Specific Gravity, Urine 1.003 (*)    All other components within normal limits      RADIOLOGY CT renal stone study per radiologist shows a nonobstructing 6 mm left intrarenal calculus, small left pleural effusion, significant CAD, aortic arthrosclerosis.  No obstruction noted to small bowel, anterior right abdominal wall residual seroma cavity in the region where patient has spinal stimulator device was removed.  Stable degenerative grade 1 anterolisthesis of L4-L5.    PROCEDURES:  Critical Care performed:   Procedures   MEDICATIONS ORDERED IN ED: Medications  lidocaine (LIDODERM) 5 % 1 patch (1 patch Transdermal Patch Applied 01/25/22 1242)  methocarbamol (ROBAXIN) tablet 500 mg (has no administration in time range)  methocarbamol (ROBAXIN) tablet 500 mg (500 mg Oral Given 01/25/22 1241)  HYDROmorphone (DILAUDID) injection 1 mg (1 mg Intramuscular Given 01/25/22 1242)     IMPRESSION / MDM / ASSESSMENT AND PLAN / ED COURSE  I reviewed the triage vital signs and the nursing notes.   Differential diagnosis includes, but is not limited to, chronic low back pain, acute exacerbation of chronic low back pain, kidney stone, new compression  fracture.  76 year old male presents to the ED with complaint of acute exacerbation of his chronic low back pain.  Patient has a history  of low back pain and lumbar postlaminectomy syndrome.  Patient denies any recent injury.  He states that pain has been controlled with medications from the pain clinic and seeing Dr. Dossie Arbour.  He gets steroid injections and until recently was using a implanted intrathecal pain pump which was removed last month.  Patient states that he has used this method for pain control for the last 16 years but felt that it was not helping him any.  He denies any new symptoms suggestive of cauda equina or increased weakness in his lower extremities.  Patient was noted to have increased pain with movement but was able to rest if lying supine on stretcher.  Urinalysis reassuring and was negative.  CT renal study did show an intrarenal stone on the left but no new findings for his back.  We discussed possibility that his back pain is exacerbated by muscle spasms.  Patient was given hydromorphone and methocarbamol 500 mg while in the ED which did not make him drowsy.  I discussed with Dr. Ellender Hose increasing his methocarbamol while in the ED to a total of 1000 mg.  Patient is agreeable with this plan and also a prescription for Lidoderm patch and Zanaflex was sent to the pharmacy.  Patient already has an appointment for evaluation by Dr. Dossie Arbour at the pain clinic for August.  At that time most likely he will get steroid injections.  Patient is to return to the emergency department if any worsening of his symptoms such as sudden loss of bowel or bladder control or new onset of weakness to the lower extremities.      Patient's presentation is most consistent with severe exacerbation of chronic illness.  FINAL CLINICAL IMPRESSION(S) / ED DIAGNOSES   Final diagnoses:  Acute exacerbation of chronic low back pain     Rx / DC Orders   ED Discharge Orders          Ordered    tiZANidine  (ZANAFLEX) 4 MG tablet  Every 8 hours PRN        01/25/22 1522    lidocaine (LIDODERM) 5 %  Every 12 hours        01/25/22 1525             Note:  This document was prepared using Dragon voice recognition software and may include unintentional dictation errors.   Johnn Hai, PA-C 01/25/22 1540    Duffy Bruce, MD 01/25/22 2234

## 2022-01-29 ENCOUNTER — Encounter: Payer: Self-pay | Admitting: Pain Medicine

## 2022-01-30 ENCOUNTER — Ambulatory Visit
Admission: RE | Admit: 2022-01-30 | Discharge: 2022-01-30 | Disposition: A | Discharge status: Home / Self Care | Payer: Medicare (Managed Care) | Attending: Pain Medicine | Admitting: Pain Medicine

## 2022-01-30 ENCOUNTER — Ambulatory Visit
Admission: RE | Admit: 2022-01-30 | Discharge: 2022-01-30 | Disposition: A | Discharge status: Home / Self Care | Payer: Medicare (Managed Care) | Source: Ambulatory Visit | Attending: Pain Medicine | Admitting: Pain Medicine

## 2022-01-30 ENCOUNTER — Ambulatory Visit (HOSPITAL_BASED_OUTPATIENT_CLINIC_OR_DEPARTMENT_OTHER): Payer: Medicare (Managed Care) | Admitting: Pain Medicine

## 2022-01-30 ENCOUNTER — Encounter: Payer: Self-pay | Admitting: Pain Medicine

## 2022-01-30 VITALS — BP 129/59 | HR 58 | Temp 98.1°F | Ht 75.0 in | Wt 200.0 lb

## 2022-01-30 DIAGNOSIS — M62830 Muscle spasm of back: Secondary | ICD-10-CM

## 2022-01-30 DIAGNOSIS — G894 Chronic pain syndrome: Secondary | ICD-10-CM

## 2022-01-30 DIAGNOSIS — R937 Abnormal findings on diagnostic imaging of other parts of musculoskeletal system: Secondary | ICD-10-CM

## 2022-01-30 DIAGNOSIS — M4316 Spondylolisthesis, lumbar region: Secondary | ICD-10-CM | POA: Insufficient documentation

## 2022-01-30 DIAGNOSIS — M51379 Other intervertebral disc degeneration, lumbosacral region without mention of lumbar back pain or lower extremity pain: Secondary | ICD-10-CM

## 2022-01-30 DIAGNOSIS — M545 Low back pain, unspecified: Secondary | ICD-10-CM

## 2022-01-30 DIAGNOSIS — G8929 Other chronic pain: Secondary | ICD-10-CM | POA: Insufficient documentation

## 2022-01-30 DIAGNOSIS — Z79899 Other long term (current) drug therapy: Secondary | ICD-10-CM | POA: Insufficient documentation

## 2022-01-30 DIAGNOSIS — M47816 Spondylosis without myelopathy or radiculopathy, lumbar region: Secondary | ICD-10-CM

## 2022-01-30 DIAGNOSIS — M961 Postlaminectomy syndrome, not elsewhere classified: Secondary | ICD-10-CM

## 2022-01-30 DIAGNOSIS — M47817 Spondylosis without myelopathy or radiculopathy, lumbosacral region: Secondary | ICD-10-CM

## 2022-01-30 DIAGNOSIS — F32A Depression, unspecified: Secondary | ICD-10-CM | POA: Insufficient documentation

## 2022-01-30 DIAGNOSIS — M5441 Lumbago with sciatica, right side: Secondary | ICD-10-CM

## 2022-01-30 DIAGNOSIS — M5137 Other intervertebral disc degeneration, lumbosacral region: Secondary | ICD-10-CM

## 2022-01-30 DIAGNOSIS — M5442 Lumbago with sciatica, left side: Secondary | ICD-10-CM | POA: Insufficient documentation

## 2022-01-30 DIAGNOSIS — M5386 Other specified dorsopathies, lumbar region: Secondary | ICD-10-CM

## 2022-01-30 DIAGNOSIS — Z789 Other specified health status: Secondary | ICD-10-CM | POA: Insufficient documentation

## 2022-01-30 DIAGNOSIS — M899 Disorder of bone, unspecified: Secondary | ICD-10-CM

## 2022-01-30 DIAGNOSIS — M79673 Pain in unspecified foot: Secondary | ICD-10-CM | POA: Insufficient documentation

## 2022-01-30 MED ORDER — KETOROLAC TROMETHAMINE 60 MG/2ML IM SOLN
60.0000 mg | Freq: Once | INTRAMUSCULAR | Status: AC
  Administered 2022-01-30: 60 mg via INTRAMUSCULAR

## 2022-01-30 MED ORDER — ORPHENADRINE CITRATE 30 MG/ML IJ SOLN
60.0000 mg | Freq: Once | INTRAMUSCULAR | Status: AC
  Administered 2022-01-30: 60 mg via INTRAMUSCULAR

## 2022-01-30 MED ORDER — KETOROLAC TROMETHAMINE 60 MG/2ML IM SOLN
INTRAMUSCULAR | Status: AC
  Filled 2022-01-30: qty 2

## 2022-01-30 MED ORDER — ORPHENADRINE CITRATE 30 MG/ML IJ SOLN
INTRAMUSCULAR | Status: AC
  Filled 2022-01-30: qty 2

## 2022-01-30 NOTE — Patient Instructions (Signed)
______________________________________________________________________  Preparing for Procedure with Sedation  NOTICE: Due to recent regulatory changes, starting on February 19, 2021, procedures requiring intravenous (IV) sedation will no longer be performed at the [facility].  These types of procedures are required to be performed at [facility] ambulatory surgery facility.  We are very sorry for the inconvenience.  Procedure appointments are limited to planned procedures: No Prescription Refills. No disability issues will be discussed. No medication changes will be discussed.  Instructions: Oral Intake: Do not eat or drink anything for at least 8 hours prior to your procedure. (Exception: Blood Pressure Medication. See below.) Transportation: A driver is required. You may not drive yourself after the procedure. Blood Pressure Medicine: Do not forget to take your blood pressure medicine with a sip of water the morning of the procedure. If your Diastolic (lower reading) is above 100 mmHg, elective cases will be cancelled/rescheduled. Blood thinners: These will need to be stopped for procedures. Notify our staff if you are taking any blood thinners. Depending on which one you take, there will be specific instructions on how and when to stop it. Diabetics on insulin: Notify the staff so that you can be scheduled 1st case in the morning. If your diabetes requires high dose insulin, take only  of your normal insulin dose the morning of the procedure and notify the staff that you have done so. Preventing infections: Shower with an antibacterial soap the morning of your procedure. Build-up your immune system: Take 1000 mg of Vitamin C with every meal (3 times a day) the day prior to your procedure. Antibiotics: Inform the staff if you have a condition or reason that requires you to take antibiotics before dental procedures. Pregnancy: If you are pregnant, call and cancel the procedure. Sickness: If  you have a cold, fever, or any active infections, call and cancel the procedure. Arrival: You must be in the facility at least 30 minutes prior to your scheduled procedure. Children: Do not bring children with you. Dress appropriately: There is always the possibility that your clothing may get soiled. Valuables: Do not bring any jewelry or valuables.  Reasons to call and reschedule or cancel your procedure: (Following these recommendations will minimize the risk of a serious complication.) Surgeries: Avoid having procedures within 2 weeks of any surgery. (Avoid for 2 weeks before or after any surgery). Flu Shots: Avoid having procedures within 2 weeks of a flu shots. (Avoid for 2 weeks before or after immunizations). Barium: Avoid having a procedure within 7-10 days after having had a radiological study involving the use of radiological contrast. (Myelograms, Barium swallow or enema study). Heart attacks: Avoid any elective procedures or surgeries for the initial 6 months after a "Myocardial Infarction" (Heart Attack). Blood thinners: It is imperative that you stop these medications before procedures. Let us know if you if you take any blood thinner.  Infection: Avoid procedures during or within two weeks of an infection (including chest colds or gastrointestinal problems). Symptoms associated with infections include: Localized redness, fever, chills, night sweats or profuse sweating, burning sensation when voiding, cough, congestion, stuffiness, runny nose, sore throat, diarrhea, nausea, vomiting, cold or Flu symptoms, recent or current infections. It is specially important if the infection is over the area that we intend to treat. Heart and lung problems: Symptoms that may suggest an active cardiopulmonary problem include: cough, chest pain, breathing difficulties or shortness of breath, dizziness, ankle swelling, uncontrolled high or unusually low blood pressure, and/or palpitations. If you are    experiencing any of these symptoms, cancel your procedure and contact your primary care physician for an evaluation.  Remember:  Regular Business hours are:  Monday to Thursday 8:00 AM to 4:00 PM  Provider's Schedule: Billy Rocco, MD:  Procedure days: Tuesday and Thursday 7:30 AM to 4:00 PM  Bilal Lateef, MD:  Procedure days: Monday and Wednesday 7:30 AM to 4:00 PM ______________________________________________________________________  ____________________________________________________________________________________________  General Risks and Possible Complications  Patient Responsibilities: It is important that you read this as it is part of your informed consent. It is our duty to inform you of the risks and possible complications associated with treatments offered to you. It is your responsibility as a patient to read this and to ask questions about anything that is not clear or that you believe was not covered in this document.  Patient's Rights: You have the right to refuse treatment. You also have the right to change your mind, even after initially having agreed to have the treatment done. However, under this last option, if you wait until the last second to change your mind, you may be charged for the materials used up to that point.  Introduction: Medicine is not an exact science. Everything in Medicine, including the lack of treatment(s), carries the potential for danger, harm, or loss (which is by definition: Risk). In Medicine, a complication is a secondary problem, condition, or disease that can aggravate an already existing one. All treatments carry the risk of possible complications. The fact that a side effects or complications occurs, does not imply that the treatment was conducted incorrectly. It must be clearly understood that these can happen even when everything is done following the highest safety standards.  No treatment: You can choose not to proceed with the  proposed treatment alternative. The "PRO(s)" would include: avoiding the risk of complications associated with the therapy. The "CON(s)" would include: not getting any of the treatment benefits. These benefits fall under one of three categories: diagnostic; therapeutic; and/or palliative. Diagnostic benefits include: getting information which can ultimately lead to improvement of the disease or symptom(s). Therapeutic benefits are those associated with the successful treatment of the disease. Finally, palliative benefits are those related to the decrease of the primary symptoms, without necessarily curing the condition (example: decreasing the pain from a flare-up of a chronic condition, such as incurable terminal cancer).  General Risks and Complications: These are associated to most interventional treatments. They can occur alone, or in combination. They fall under one of the following six (6) categories: no benefit or worsening of symptoms; bleeding; infection; nerve damage; allergic reactions; and/or death. No benefits or worsening of symptoms: In Medicine there are no guarantees, only probabilities. No healthcare provider can ever guarantee that a medical treatment will work, they can only state the probability that it may. Furthermore, there is always the possibility that the condition may worsen, either directly, or indirectly, as a consequence of the treatment. Bleeding: This is more common if the patient is taking a blood thinner, either prescription or over the counter (example: Goody Powders, Fish oil, Aspirin, Garlic, etc.), or if suffering a condition associated with impaired coagulation (example: Hemophilia, cirrhosis of the liver, low platelet counts, etc.). However, even if you do not have one on these, it can still happen. If you have any of these conditions, or take one of these drugs, make sure to notify your treating physician. Infection: This is more common in patients with a compromised  immune system, either due to disease (example:   diabetes, cancer, human immunodeficiency virus [HIV], etc.), or due to medications or treatments (example: therapies used to treat cancer and rheumatological diseases). However, even if you do not have one on these, it can still happen. If you have any of these conditions, or take one of these drugs, make sure to notify your treating physician. Nerve Damage: This is more common when the treatment is an invasive one, but it can also happen with the use of medications, such as those used in the treatment of cancer. The damage can occur to small secondary nerves, or to large primary ones, such as those in the spinal cord and brain. This damage may be temporary or permanent and it may lead to impairments that can range from temporary numbness to permanent paralysis and/or brain death. Allergic Reactions: Any time a substance or material comes in contact with our body, there is the possibility of an allergic reaction. These can range from a mild skin rash (contact dermatitis) to a severe systemic reaction (anaphylactic reaction), which can result in death. Death: In general, any medical intervention can result in death, most of the time due to an unforeseen complication. ____________________________________________________________________________________________  

## 2022-01-30 NOTE — Progress Notes (Signed)
PROVIDER NOTE: Information contained herein reflects review and annotations entered in association with encounter. Interpretation of such information and data should be left to medically-trained personnel. Information provided to patient can be located elsewhere in the medical record under "Patient Instructions". Document created using STT-dictation technology, any transcriptional errors that may result from process are unintentional.    Patient: [name]  Service Category: E/M  Provider: [name], MD  DOB: 08-03-1945  DOS: 01/30/2022  Specialty: Interventional Pain Management  MRN: [id number]  Setting: Ambulatory outpatient  PCP: Adin Hector, MD  Type: Established Patient    Referring Provider: Adin Hector, MD  Location: Office  Delivery: Face-to-face     HPI  [name], a 76 y.o. year old male, is here today because of his Acute exacerbation of chronic low back pain [M54.50, G89.29]. [name] primary complain today is Back Pain (lower) Last encounter: My last encounter with him was on 01/24/2022. 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[place]); Chronic ankle pain (Bilateral); Pain in joint, ankle and foot; Polyneuropathy associated with underlying disease ([place]); Presence of intrathecal pump; Ankle sprain; Rotator cuff sprain; Lumbosacral radiculopathy at S1 (Bilateral) (R>L); Chronic feet pain (3ry area of Pain) (Bilateral) (R>L); Chronic knee pain (4th area of Pain) (Bilateral) (R>L); Failed back surgical syndrome; Lower extremity weakness (Bilateral); Chronic neuropathic pain; Peripheral neurogenic pain; Nonfamilial nocturnal leg cramps (Bilateral); Restless leg syndrome; Pain in right  knee; DDD (degenerative disc disease), lumbosacral; Abnormal MRI, lumbar spine (11/01/2019); 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; Lumbar facet arthropathy (Multilevel) (Bilateral); Lumbosacral facet syndrome (Bilateral); Lumbar foraminal narrowing (Multilevel) (Bilateral); Cervicalgia; Chronic shoulder pain (Bilateral) (R>L); Radicular pain of shoulder; Occipital neuralgia (Right); Cervico-occipital neuralgia (Right); Cervical facet hypertrophy (Multilevel) (Bilateral); Cervical facet pain; Painful cervical range of motion; Chronic neurogenic pain; Left rib fracture; Chronic pain; Foot pain; Lumbago; 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 Pain Assessment: Severity of Chronic pain is reported as a 10-Worst pain ever/10. Location: Back Right, Left/Denies. Onset: More than a month ago. Quality: Aching, Burning, Constant, Stabbing, Sharp. Timing: Constant. Modifying factor(s): sitting. Vitals:  height is 6' 3"  (1.905 m) and weight is 200 lb (90.7 kg). His temperature is 98.1 F (36.7 C). His blood pressure is 129/59 (abnormal) and his pulse is 58 (abnormal).   Reason for encounter: evaluation of worsening, or previously known (established) problem.  The patient comes into the clinic today with an acute exacerbation of his low back pain.  He indicated that this started several days ago and he has not been able to sleep.  He ended up going to the emergency department where he had a CT scan of the kidneys.  There were negative for any obstructive kidney problems.  However the patient does have a history of a lumbar postlaminectomy syndrome and a grade 1 anterolisthesis of L4 over L5.  Currently he refers that although his pain is bilateral, currently it is much worse on the right side.  He denies  any pain or numbness going down the legs.  He refers that the pain does not even get  to the buttocks area.  Today he is unable to straighten out his back.  Patrick maneuver was negative for sacroiliac joint arthralgia but positive for decreased range of motion of the hip joints with posterior hip joint arthralgia, bilaterally.  However, the patient indicated that that particular discomfort is not the type of pain or the location of the the pain that he is experiencing.  In the ED he was prescribed some type tizanidine for his back pain and as a muscle relaxant.  Palpation of his lumbar spine today does show increased muscle tension on the right lower back, but the pain is much deeper than the muscle according to the patient.  The patient does have a prior MRI of the lumbar spine that was done on 11/01/2019.  He refers that his back pain has worsened considerably since that exam.  In the meanwhile we have also removed his intrathecal pump.  He is managing his pain now with oral opioid analgesics which he seems to tolerate well except for the problem that he is currently going through with this exacerbation.  Because the patient does have a history of an L4/L5 anterolisthesis, I will be ordering today x-rays of his lumbar spine on flexion and extension to assess for possible instability.  Because he also has a prior history of back surgery and his condition has exacerbated after a recent removal of an intrathecal pump, I will be ordering an MRI to evaluate for late complications of this surgery.  He does have a history of diabetes, increasing his risk of infection.  However skin wounds from his surgery have fully healed with no evidence of redness, swelling, tenderness, or discharge, suggesting that if there is a problem from the surgery it may be in the deeper tissues where it cannot be externally observed.  He is denying problems with fever or night sweats and therefore it is unlikely that it is an infection.   However, he does have history of problems with facet joint disease and therefore further evaluation is warranted.  He does not seem to have any contraindications to doing an MRI and since the possibility of soft tissue pathology is present, the study of choice here would be the MRI.  In addition to the above, I am ordering lab work which includes a comprehensive metabolic panel to check on his kidney function and a sed rate and C-reactive protein to evaluate for inflammatory spondylopathy.  Today he is also given an IM injection of Toradol/Norflex so as to decrease his current discomfort and perhaps be able to better observe changes on his flexion and extension x-rays.  I have encouraged him to go today to have those x-rays done.  Tentatively, I will be scheduling him to come in for a diagnostic right-sided lumbar facet block under fluoroscopic guidance and IV sedation.  Pharmacotherapy Assessment  Analgesic: Hydromorphone 4 mg tablet, 1 tab p.o. 3 times daily (last filled on 04/02/2021) MME/day: 0 mg/day    Monitoring: [place] PMP: PDMP reviewed during this encounter.       Pharmacotherapy: No side-effects or adverse reactions reported. Compliance: No problems identified. Effectiveness: Clinically acceptable.  Chauncey Fischer, RN  01/30/2022  8:43 AM  Sign when Signing Visit Safety precautions to be maintained throughout the outpatient stay will include: orient to surroundings, keep bed in low position, maintain call bell within reach at all times, provide assistance with transfer out of bed and ambulation.     UDS:  Summary  Date Value Ref Range  Status  09/19/2021 Note  Final    Comment:    ==================================================================== [employer] [facility] 13 (MW) ==================================================================== Test                             Result       Flag       Units  Drug Present and Declared for Prescription Verification   Hydromorphone                   1862         EXPECTED   ng/mg creat    Hydromorphone may be administered as a scheduled prescription    medication; it is also an expected metabolite of hydrocodone.  ==================================================================== Test                      Result    Flag   Units      Ref Range   Creatinine              26               mg/dL      >=20 ==================================================================== Declared Medications:  The flagging and interpretation on this report are based on the  following declared medications.  Unexpected results may arise from  inaccuracies in the declared medications.   **Note: The testing scope of this panel includes these medications:   Hydromorphone (Dilaudid)   **Note: The testing scope of this panel does not include the  following reported medications:   Albuterol (Proair HFA)  Amitriptyline (Elavil)  Azelastine  Bisacodyl  Clonidine (Catapres)  Duloxetine (Cymbalta)  Eye Drop  Levothyroxine (Synthroid)  Losartan (Cozaar)  Multivitamin  Naloxone (Narcan)  Olodaterol  Pregabalin (Lyrica)  Promethazine (Phenergan)  Simvastatin (Zocor)  Tiotropium ==================================================================== For clinical consultation, please call [phone]. ====================================================================      ROS  Constitutional: Denies any fever or chills Gastrointestinal: No reported hemesis, hematochezia, vomiting, or acute GI distress Musculoskeletal: Denies any acute onset joint swelling, redness, loss of ROM, or weakness Neurological: No reported episodes of acute onset apraxia, aphasia, dysarthria, agnosia, amnesia, paralysis, loss of coordination, or loss of consciousness  Medication Review  DULoxetine, HYDROmorphone, albuterol, amitriptyline, aspirin EC, azelastine, bisacodyl, cephALEXin, cloNIDine, latanoprost, levothyroxine, lidocaine, loratadine, losartan,  metoprolol tartrate, naloxone, pregabalin, promethazine, senna-docusate, simvastatin, and tiZANidine  History Review  Allergy: [name] is allergic to celebrex [celecoxib], codeine, and penicillins. Drug: [name]  reports no history of drug use. Alcohol:  reports no history of alcohol use. Tobacco:  reports that he has been smoking cigarettes. He has a 44.25 pack-year smoking history. He has never used smokeless tobacco. Social: [name]  reports that he has been smoking cigarettes. He has a 44.25 pack-year smoking history. He has never used smokeless tobacco. He reports that he does not drink alcohol and does not use drugs. Medical:  has a past medical history of Acute anterolateral wall MI ([place]) (2000), Acute non-Q wave non-ST elevation myocardial infarction (NSTEMI) ([place]) (12/03/2004), Adenomatous polyp of colon, Agent orange exposure, Angina pectoris ([place]), Anxiety, Aortic atherosclerosis ([place]), Asthma, Bladder stones, Bradycardia, CHF (congestive heart failure) ([place]), Chronic lower back pain, Chronic pain syndrome, COPD (chronic obstructive pulmonary disease) ([place]), Coronary artery disease, Depression, DJD (degenerative joint disease), DVT of lower extremity, bilateral ([place]), Dyspnea, GERD (gastroesophageal reflux disease), History of hiatal hernia, HLD (hyperlipidemia), Hypertension, Hypothyroidism, Inflammatory polyarthropathy ([place]), Internal hemorrhoids, Long term prescription opiate use, Malignant neoplasm  of posterior wall of bladder ([place]) (10/03/2015), Neuropathy, NSVT (nonsustained ventricular tachycardia) ([place]) (12/04/2004), OSA (obstructive sleep apnea), Osteoarthritis, Osteoporosis, PAF (paroxysmal atrial fibrillation) ([place]), Paralysis of RIGHT diaphragm, Peripheral vascular disease ([place]), Rhabdomyolysis (2006), Senile purpura ([place]), Sepsis due to pneumonia ([place]) (10/28/2021), Stasis dermatitis of both legs, T2DM (type 2 diabetes mellitus) ([place]), and Therapeutic opioid-induced constipation  ([facility]). Surgical: [name]  has a past surgical history that includes Coronary angioplasty; Posterior lumbar fusion (N/A, 1999); Nissen fundoplication (N/A, 5035); Mastoidectomy; Partial colectomy (N/A); Intrathecal pump implantation (N/A, 03/2008); Intrathecal pump revision (N/A, 09/28/2014); Transurethral resection of bladder tumor (N/A, 03/07/2015); Colonoscopy with propofol (N/A, 02/27/2018); External ear surgery; Skin cancer excision; IVC FILTER PLACEMENT ([facility]) (N/A, 09/2007); IVC FILTER REMOVAL (N/A, 2011); Colonoscopy (N/A, 11/22/2013); Colonoscopy (N/A, 09/19/2008); Colonoscopy (N/A, 08/25/2003); Colonoscopy (N/A, 10/15/1994); Posterior laminectomy / decompression cervical spine (N/A, 1984); Posterior laminectomy / decompression cervical spine (N/A, 1985); Tonsillectomy (Bilateral, 1958); Coronary angioplasty with stent (Left, 12/04/2004); LEFT HEART CATH AND CORONARY ANGIOGRAPHY (Left, 02/19/2008); LEFT HEART CATH AND CORONARY ANGIOGRAPHY (Left, 04/18/1987); LEFT HEART CATH AND CORONARY ANGIOGRAPHY (Left, 02/03/2002); and Pain pump removal (N/A, 12/20/2021). Family: family history includes Emphysema in his father and sister; Heart attack in his father; Heart disease in his brother and mother.  Laboratory Chemistry Profile   Renal Lab Results  Component Value Date   BUN 21 11/01/2021   CREATININE 0.97 11/01/2021   BCR 11 02/07/2021   GFRAA >60 03/02/2015   GFRNONAA >60 11/01/2021    Hepatic Lab Results  Component Value Date   AST 18 02/07/2021   ALBUMIN 4.2 02/07/2021   ALKPHOS 123 (H) 02/07/2021    Electrolytes Lab Results  Component Value Date   NA 141 11/01/2021   K 4.1 11/01/2021   CL 105 11/01/2021   CALCIUM 8.8 (L) 11/01/2021   MG 2.3 11/01/2021   PHOS 3.7 11/01/2021    Bone Lab Results  Component Value Date   25OHVITD1 22 (L) 02/07/2021   25OHVITD2 <1.0 02/07/2021   25OHVITD3 22 02/07/2021    Inflammation (CRP: Acute Phase) (ESR: Chronic Phase) Lab Results   Component Value Date   CRP 3 02/07/2021   ESRSEDRATE 6 02/07/2021   LATICACIDVEN 0.9 10/28/2021         Note: Above Lab results reviewed.  Recent Imaging Review  CT Renal Stone Study CLINICAL DATA:  Flank pain. Kidney stones suspected. LOWER back pain for 1 month. Unable to stand or walk.  EXAM: CT ABDOMEN AND PELVIS WITHOUT CONTRAST  TECHNIQUE: Multidetector CT imaging of the abdomen and pelvis was performed following the standard protocol without IV contrast.  RADIATION DOSE REDUCTION: This exam was performed according to the departmental dose-optimization program which includes automated exposure control, adjustment of the mA and/or kV according to patient size and/or use of iterative reconstruction technique.  COMPARISON:  12/11/2020  FINDINGS: Lower chest: There is small LEFT pleural effusion. RIGHT basilar subsegmental atelectasis is present. Motion degraded images of the LOWER chest. There is significant atherosclerosis of the coronary arteries. Heart size is UPPER normal.  Hepatobiliary: No focal liver abnormality is seen. No radiopaque gallstones, biliary dilatation, or pericholecystic inflammatory changes.  Pancreas: Unremarkable. No pancreatic ductal dilatation or surrounding inflammatory changes.  Spleen: Normal in size without focal abnormality.  Adrenals/Urinary Tract: Adrenal glands are normal. RIGHT kidney is unremarkable. RIGHT ureter is normal.  Intrarenal calculus in the mid to LOWER pole region of the LEFT kidney is 6 millimeters. LEFT kidney is otherwise normal in appearance. LEFT ureter is  unremarkable.  The bladder and visualized portion of the urethra are normal.  Stomach/Bowel: Stomach is normal in appearance. 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 There is no evidence for obstruction however. There is significant stool burden throughout colon large stool burden is identified  within the rectosigmoid colon, distending the sigmoid colon to 8.7 centimeters. There is significant stool burden throughout the colon to the level of the cecum. The appendix is well seen and has a normal appearance.  Vascular/Lymphatic: There is atherosclerotic calcification of the abdominal aorta. No associated aneurysm. No retroperitoneal or mesenteric adenopathy.  Reproductive: Prostate is unremarkable.  Other: No ascites. Small fat containing paraumbilical hernia is present. Within the RIGHT anterior abdominal wall, there is a focal fluid collection, at the site of prior spinal stimulator power pack. Collection measures 6.9 x 2.4 centimeters.  Musculoskeletal: Interval removal of spinal stimulator. Moderate degenerative changes in the LOWER lumbar spine. There is 7 millimeters of anterolisthesis of L4 on L5. No lytic or blastic lesions. No acute fracture.  IMPRESSION: 1. Nonobstructing 6 millimeter LEFT intrarenal calculus. 2. No evidence for obstructive uropathy. 3. Small LEFT pleural effusion. 4. Significant coronary artery disease. 5.  Aortic atherosclerosis.  (ICD10-I70.0) 6. 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 7. Interval removal of spinal stimulator device and power pack from the anterior abdominal wall with residual seroma cavity in this region. 8. Stable degenerative grade 1 anterolisthesis of L4 on L5.  Electronically Signed   By: Nolon Nations M.D.   On: 01/25/2022 14:43 Note: Reviewed        Physical Exam  General appearance: Well nourished, well developed, and well hydrated. In no apparent acute distress Mental status: Alert, oriented x 3 (person, place, & time)       Respiratory: No evidence of acute respiratory distress Eyes: PERLA Vitals: BP (!) 129/59   Pulse (!) 58   Temp 98.1 F (36.7 C)   Ht 6' 3"  (1.905 m)   Wt 200 lb (90.7 kg)   BMI 25.00 kg/m  BMI: Estimated body mass index is 25  kg/m as calculated from the following:   Height as of this encounter: 6' 3"  (1.905 m).   Weight as of this encounter: 200 lb (90.7 kg). Ideal: Ideal body weight: 84.5 kg (186 lb 4.6 oz) Adjusted ideal body weight: 87 kg (191 lb 12.4 oz)  Assessment   Diagnosis Status  1. Acute exacerbation of chronic low back pain   2. Lumbar paraspinal muscle spasm   3. Decreased range of motion of lumbar spine   4. 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 Lumbosacral facet syndrome (Bilateral)   6. Lumbar facet arthropathy (Multilevel) (Bilateral)   7. Spondylosis without myelopathy or radiculopathy, lumbosacral region   8. Chronic low back pain (1ry area of Pain) (Bilateral) (R>L) w/ sciatica (Bilateral)   9. DDD (degenerative disc disease), lumbosacral   10. Failed back surgical syndrome   11. Abnormal MRI, lumbar spine (11/01/2019)   12. Chronic pain syndrome   13. Pharmacologic therapy   14. Disorder of skeletal system   15. 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)  Decreased Range of Motion of Lumbar Spine  Lumbar Paraspinal Muscle Spasm  Spondylosis Without Myelopathy Or Radiculopathy, Lumbosacral Region  Left Rib Fracture  Chronic  Pain  Ankle Sprain  Rotator Cuff Sprain  Overweight (Bmi 25.0-29.9)  Fall  Copd (Chronic Obstructive Pulmonary Disease) ([facility])  Cad (Coronary Artery Disease)  Paf (Paroxysmal Atrial Fibrillation) ([facility])  Depression  Aortic Atherosclerosis ([facility])  Elevated Troponin  Acute On Chronic Respiratory Failure With Hypoxia ([facility])  Depression With Anxiety  Irritable Bowel Syndrome  Gastroesophageal Reflux Disease  Hypothyroidism  Mechanical Breakdown of Spinal Infusion Catheter (Resolved)  Malfunction of Intrathecal Infusion Pump (Resolved)  End of Battery Life of Intrathecal Infusion Pump (Resolved)    Plan of Care   Problem-specific:  No problem-specific Assessment & Plan notes found for this encounter.  [name] has a current medication list which includes the following long-term medication(s): albuterol, amitriptyline, azelastine, duloxetine, hydromorphone, [START ON 02/26/2022] hydromorphone, levothyroxine, loratadine, losartan, metoprolol tartrate, pregabalin, simvastatin, cephalexin, and hydromorphone.  Pharmacotherapy (Medications Ordered): Meds ordered this encounter  Medications   ketorolac (TORADOL) injection 60 mg   orphenadrine (NORFLEX) injection 60 mg   Orders:  Orders Placed This Encounter  Procedures   LUMBAR FACET(MEDIAL BRANCH NERVE BLOCK) MBNB    Standing Status:   Future    Standing Expiration Date:   05/02/2022    Scheduling Instructions:     Procedure: Lumbar facet block (AKA.: Lumbosacral medial branch nerve block)     Side: Right-sided     Level: L3-4 & L5-S1 Facets (L2, L3, L4, L5, & S1 Medial Branch Nerves)     Sedation: With Sedation.     Timeframe: ASAP    Order Specific Question:   Where will this procedure be performed?    Answer:   [facility]   DG Lumbar Spine Complete W/Bend    Patient presents with axial pain with possible radicular component. Please assist [place] in identifying specific level(s) and laterality of any additional findings such as: 1. Facet (Zygapophyseal) joint DJD (Hypertrophy, space narrowing, subchondral sclerosis, and/or osteophyte formation) 2. DDD and/or IVDD (Loss of disc height, desiccation, gas patterns, osteophytes, endplate sclerosis, or "Black disc disease") 3. Pars defects 4. Spondylolisthesis, spondylosis, and/or spondyloarthropathies (include Degree/Grade of displacement in mm) (stability) 5. Vertebral body Fractures (acute/chronic) (state percentage of collapse) 6. Demineralization (osteopenia/osteoporotic) 7. Bone pathology 8. Foraminal narrowing  9. Surgical changes    Standing Status:   Future    Standing  Expiration Date:   03/02/2022    Scheduling Instructions:     Imaging must be done as soon as possible. Inform patient that order will expire within 30 days and I will not renew it.    Order Specific Question:   Reason for Exam (SYMPTOM  OR DIAGNOSIS REQUIRED)    Answer:   Low back pain    Order Specific Question:   Preferred imaging location?    Answer:   [facility]    Order Specific Question:   Call Results- Best Contact Number?    Answer:   [phone] ([facility])    Order Specific Question:   Radiology Contrast Protocol - do NOT remove file path    Answer:   [email or web address]    Order Specific Question:   Release to patient    Answer:   Immediate   MR LUMBAR SPINE WO CONTRAST    Patient presents with axial pain with possible radicular component. Please assist [place] in identifying specific level(s) and laterality of any additional findings such as: 1. Facet (Zygapophyseal) joint DJD (Hypertrophy, space narrowing, subchondral sclerosis, and/or osteophyte formation) 2. DDD and/or IVDD (Loss of disc height, desiccation, gas  patterns, osteophytes, endplate sclerosis, or "Black disc disease") 3. Pars defects 4. Spondylolisthesis, spondylosis, and/or spondyloarthropathies (include Degree/Grade of displacement in mm) (stability) 5. Vertebral body Fractures (acute/chronic) (state percentage of collapse) 6. Demineralization (osteopenia/osteoporotic) 7. Bone pathology 8. Foraminal narrowing  9. Surgical changes 10. Central, Lateral Recess, and/or Foraminal Stenosis (include AP diameter of stenosis in mm) 11. Surgical changes (hardware type, status, and presence of fibrosis) 12. Modic Type Changes (MRI only) 13. IVDD (Disc bulge, protrusion, herniation, extrusion) (Level, laterality, extent)    Standing Status:   Future    Standing Expiration Date:   03/02/2022    Scheduling Instructions:     Imaging must be done as soon as possible. Inform  patient that order will expire within 30 days and I will not renew it.    Order Specific Question:   What is the patient's sedation requirement?    Answer:   No Sedation    Order Specific Question:   Does the patient have a pacemaker or implanted devices?    Answer:   No    Order Specific Question:   Preferred imaging location?    Answer:   [facility] (table limit-350lbs)    Order Specific Question:   Call Results- Best Contact Number?    Answer:   [phone] ([facility])    Order Specific Question:   Radiology Contrast Protocol - do NOT remove file path    Answer:   [email or web address]   Sedimentation rate    Indication: Disorder of skeletal system (M89.9) Cc PCP: Adin Hector, MD    Order Specific Question:   CC Results    Answer:   [id number] [[id number]]    Order Specific Question:   Release to patient    Answer:   Immediate   C-reactive protein    Indication: Problems influencing health status (Z78.9) Cc PCP: Adin Hector, MD    Order Specific Question:   CC Results    Answer:   [id number] [[id number]]    Order Specific Question:   Release to patient    Answer:   Immediate   Comp. Metabolic Panel (12)    With GFR. Indications: Chronic Pain Syndrome (G89.4) & Pharmacotherapy (J75.301) Cc PCP: Adin Hector, MD    Order Specific Question:   Has the patient fasted?    Answer:   No    Order Specific Question:   CC Results    Answer:   PCP-NURSE [[id number]]    Order Specific Question:   Release to patient    Answer:   Immediate   Informed Consent Details: Physician/Practitioner Attestation; Transcribe to consent form and obtain patient signature    Do not administer NSAIDs (Toradol, etc.) if patient has an allergy or intolerance to NSAIDs or if patient has CKD (chronic kidney disease or failure). Avoid the use of Muscle Relaxants (orphenedrine/Norflex, etc.) if patient has allergy or intolerance to this medication.    Scheduling  Instructions:     Nursing orders: Complete pain questionnaire before administration of therapy. Document location, laterality, onset, level, trigger, and current medications taken for the pain.    Order Specific Question:   Physician/Practitioner attestation of informed consent for procedure/surgical case    Answer:   I, the physician/practitioner, attest that I have discussed with the patient the benefits, risks, side effects, alternatives, likelihood of achieving goals and potential problems during recovery for the procedure that I have provided informed consent.    Order Specific Question:   Procedure  Answer:   IM injection of therapeutic substance    Order Specific Question:   Physician/Practitioner performing the procedure    Answer:   Zarahi Fuerst A. Dossie Arbour, MD    Order Specific Question:   Indication/Reason    Answer:   Acute on chronic pain   Follow-up plan:   Return for (ECT) procedure: (R) L-FCT Blk.     Interventional Therapies  Risk  Complexity Considerations:   Estimated body mass index is 27.6 kg/m as calculated from the following:   Height as of this encounter: 6' 2"  (1.88 m).   Weight as of this encounter: 215 lb (97.5 kg). WNL   Planned  Pending:   Intrathecal catheter and pump removal (2ry to end of battery life & catheter malfunction)    Under consideration:   Diagnostic intrathecal pump catheter test/evaluation  Diagnostic/therapeutic bilateral lumbar facet MBB #1  Therapeutic/palliative left L4-5 LESI #1    Completed:   Diagnostic left caudal ESI and epidurogram x1 (04/24/2021) (LBP: 50/50/0  LEP; 100/100/50-60)  Diagnostic intrathecal pump catheter test/evaluation x1 (06/21/2021) (Dx: Catheter malfunction/dysfunction)  Referral (02/07/2021 & 09/19/2021) to [facility] neurosurgery Deetta Perla, MD) for ITP replacement. Cardiology clearance to Dr. Clayborn Bigness requested (09/19/2021)    Therapeutic  Palliative (PRN) options:   None established     Recent Visits Date  Type Provider Dept  12/31/21 Office Visit Milinda Pointer, MD [facility]  11/26/21 Office Visit Milinda Pointer, MD [facility]  Showing recent visits within past 90 days and meeting all other requirements Today's Visits Date Type Provider Dept  01/30/22 Office Visit Milinda Pointer, MD [facility]  Showing today's visits and meeting all other requirements Future Appointments Date Type Provider Dept  03/20/22 Appointment Milinda Pointer, MD [facility]  Showing future appointments within next 90 days and meeting all other requirements  I discussed the assessment and treatment plan with the patient. The patient was provided an opportunity to ask questions and all were answered. The patient agreed with the plan and demonstrated an understanding of the instructions.  Patient advised to call back or seek an in-person evaluation if the symptoms or condition worsens.  Duration of encounter: 38 minutes.  Total time on encounter, as per AMA guidelines included both the face-to-face and non-face-to-face time personally spent by the physician and/or other qualified health care professional(s) on the day of the encounter (includes time in activities that require the physician or other qualified health care professional and does not include time in activities normally performed by clinical staff). Physician's time may include the following activities when performed: preparing to see the patient (eg, review of tests, pre-charting review of records) obtaining and/or reviewing separately obtained history performing a medically appropriate examination and/or evaluation counseling and educating the patient/family/caregiver ordering medications, tests, or procedures referring and communicating with other health care professionals (when not separately reported) documenting clinical information in the electronic or other health record independently  interpreting results (not separately reported) and communicating results to the patient/ family/caregiver care coordination (not separately reported)  Note by: [name], MD Date: 01/30/2022; Time: 9:54 AM

## 2022-01-30 NOTE — Progress Notes (Signed)
Safety precautions to be maintained throughout the outpatient stay will include: orient to surroundings, keep bed in low position, maintain call bell within reach at all times, provide assistance with transfer out of bed and ambulation.  

## 2022-01-31 LAB — COMP. METABOLIC PANEL (12)
AST: 31 IU/L (ref 0–40)
Albumin/Globulin Ratio: 1.5 (ref 1.2–2.2)
Albumin: 4 g/dL (ref 3.8–4.8)
Alkaline Phosphatase: 96 IU/L (ref 44–121)
BUN/Creatinine Ratio: 11 (ref 10–24)
BUN: 10 mg/dL (ref 8–27)
Bilirubin Total: 0.3 mg/dL (ref 0.0–1.2)
Calcium: 9.6 mg/dL (ref 8.6–10.2)
Chloride: 104 mmol/L (ref 96–106)
Creatinine, Ser: 0.91 mg/dL (ref 0.76–1.27)
Globulin, Total: 2.6 g/dL (ref 1.5–4.5)
Glucose: 107 mg/dL — ABNORMAL HIGH (ref 70–99)
Potassium: 5.2 mmol/L (ref 3.5–5.2)
Sodium: 145 mmol/L — ABNORMAL HIGH (ref 134–144)
Total Protein: 6.6 g/dL (ref 6.0–8.5)
eGFR: 88 mL/min/{1.73_m2} (ref >59)

## 2022-01-31 LAB — C-REACTIVE PROTEIN: CRP: 5 mg/L (ref 0–10)

## 2022-01-31 LAB — SEDIMENTATION RATE: Sed Rate: 11 mm/hr (ref 0–30)

## 2022-02-12 ENCOUNTER — Ambulatory Visit
Admission: RE | Admit: 2022-02-12 | Discharge: 2022-02-12 | Disposition: A | Discharge status: Home / Self Care | Payer: Medicare (Managed Care) | Source: Ambulatory Visit | Attending: Pain Medicine | Admitting: Pain Medicine

## 2022-02-12 DIAGNOSIS — M5441 Lumbago with sciatica, right side: Secondary | ICD-10-CM | POA: Insufficient documentation

## 2022-02-12 DIAGNOSIS — M545 Low back pain, unspecified: Secondary | ICD-10-CM | POA: Diagnosis present

## 2022-02-12 DIAGNOSIS — G8929 Other chronic pain: Secondary | ICD-10-CM | POA: Diagnosis present

## 2022-02-12 DIAGNOSIS — R937 Abnormal findings on diagnostic imaging of other parts of musculoskeletal system: Secondary | ICD-10-CM | POA: Diagnosis present

## 2022-02-12 DIAGNOSIS — M961 Postlaminectomy syndrome, not elsewhere classified: Secondary | ICD-10-CM

## 2022-02-12 DIAGNOSIS — M5137 Other intervertebral disc degeneration, lumbosacral region: Secondary | ICD-10-CM | POA: Insufficient documentation

## 2022-02-12 DIAGNOSIS — M47816 Spondylosis without myelopathy or radiculopathy, lumbar region: Secondary | ICD-10-CM | POA: Diagnosis present

## 2022-02-12 DIAGNOSIS — M5442 Lumbago with sciatica, left side: Secondary | ICD-10-CM | POA: Diagnosis present

## 2022-02-12 DIAGNOSIS — M47817 Spondylosis without myelopathy or radiculopathy, lumbosacral region: Secondary | ICD-10-CM | POA: Diagnosis present

## 2022-02-12 DIAGNOSIS — M4316 Spondylolisthesis, lumbar region: Secondary | ICD-10-CM

## 2022-02-12 DIAGNOSIS — M51379 Other intervertebral disc degeneration, lumbosacral region without mention of lumbar back pain or lower extremity pain: Secondary | ICD-10-CM

## 2022-02-12 DIAGNOSIS — M62830 Muscle spasm of back: Secondary | ICD-10-CM | POA: Diagnosis present

## 2022-02-12 DIAGNOSIS — M5386 Other specified dorsopathies, lumbar region: Secondary | ICD-10-CM | POA: Diagnosis present

## 2022-03-17 NOTE — Progress Notes (Unsigned)
PROVIDER NOTE: Information contained herein reflects review and annotations entered in association with encounter. Interpretation of such information and data should be left to medically-trained personnel. Information provided to patient can be located elsewhere in the medical record under "Patient Instructions". Document created using STT-dictation technology, any transcriptional errors that may result from process are unintentional.    Patient: [name]  Service Category: E/M  Provider: [name], MD  DOB: 05/07/1946  DOS: 03/20/2022  Referring Provider: Adin Hector, MD  MRN: [id number]  Specialty: Interventional Pain Management  PCP: Gabriel Hector, MD  Type: Established Patient  Setting: Ambulatory outpatient    Location: Office  Delivery: Face-to-face     HPI  [name], a 76 y.o. year old male, is here today because of his No primary diagnosis found.. [name] primary complain today is No chief complaint on file. Last encounter: My last encounter with him was on 01/30/2022. 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[name]); Chronic ankle pain (Bilateral); Pain in joint, ankle and foot; Polyneuropathy associated with underlying disease ([place]); Presence of intrathecal pump; Ankle sprain; Rotator cuff sprain; Lumbosacral radiculopathy at S1 (Bilateral) (R>L); Chronic feet pain (3ry area of Pain) (Bilateral) (R>L); Chronic knee pain (4th area of Pain) (Bilateral) (R>L); Failed back surgical syndrome; Lower extremity weakness (Bilateral); Chronic neuropathic pain; Peripheral neurogenic pain; Nonfamilial nocturnal leg cramps (Bilateral); Restless leg syndrome; Pain in right knee; DDD  (degenerative disc disease), lumbosacral; Abnormal MRI, lumbar spine (11/01/2019); 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; Lumbar facet arthropathy (Multilevel) (Bilateral); Lumbosacral facet syndrome (Bilateral); Lumbar foraminal narrowing (Multilevel) (Bilateral); Cervicalgia; Chronic shoulder pain (Bilateral) (R>L); Radicular pain of shoulder; Occipital neuralgia (Right); Cervico-occipital neuralgia (Right); Cervical facet hypertrophy (Multilevel) (Bilateral); Cervical facet pain; Painful cervical range of motion; Chronic neurogenic pain; Left rib fracture; Chronic pain; Foot pain; Lumbago; 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 Pain Assessment: Severity of   is reported as a  /10. Location:    / . Onset:  . Quality:  . Timing:  . Modifying factor(s):  Marland Kitchen Vitals:  vitals were not taken for this visit.   Reason for encounter:  *** . ***  Pharmacotherapy Assessment  Analgesic: Hydromorphone 4 mg tablet, 1 tab p.o. 3 times daily (last filled on 04/02/2021) MME/day: 0 mg/day    Monitoring: [place] PMP: PDMP reviewed during this encounter.       Pharmacotherapy: No side-effects or adverse reactions reported. Compliance: No problems identified. Effectiveness: Clinically acceptable.  No notes on file  No results found for: "CBDTHCR" No results found for: "D8THCCBX" No results found for: "D9THCCBX"  UDS:  Summary  Date Value Ref Range Status  09/19/2021 Note  Final    Comment:    ==================================================================== [employer] [facility] 13 (MW) ==================================================================== Test                             Result       Flag       Units  Drug Present and Declared for Prescription Verification   Hydromorphone  1862         EXPECTED   ng/mg creat    Hydromorphone may be administered as a scheduled prescription    medication; it is also an expected metabolite of hydrocodone.  ==================================================================== Test                      Result    Flag   Units      Ref Range   Creatinine              26               mg/dL      >=20 ==================================================================== Declared Medications:  The flagging and interpretation on this report are based on the  following declared medications.  Unexpected results may arise from  inaccuracies in the declared medications.   **Note: The testing scope of this panel includes these medications:   Hydromorphone (Dilaudid)   **Note: The testing scope of this panel does not include the  following reported medications:   Albuterol (Proair HFA)  Amitriptyline (Elavil)  Azelastine  Bisacodyl  Clonidine (Catapres)  Duloxetine (Cymbalta)  Eye Drop  Levothyroxine (Synthroid)  Losartan (Cozaar)  Multivitamin  Naloxone (Narcan)  Olodaterol  Pregabalin (Lyrica)  Promethazine (Phenergan)  Simvastatin (Zocor)  Tiotropium ==================================================================== For clinical consultation, please call [phone]. ====================================================================       ROS  Constitutional: Denies any fever or chills Gastrointestinal: No reported hemesis, hematochezia, vomiting, or acute GI distress Musculoskeletal: Denies any acute onset joint swelling, redness, loss of ROM, or weakness Neurological: No reported episodes of acute onset apraxia, aphasia, dysarthria, agnosia, amnesia, paralysis, loss of coordination, or loss of consciousness  Medication Review  DULoxetine, HYDROmorphone, albuterol, amitriptyline, aspirin EC, azelastine, bisacodyl, cephALEXin, cloNIDine, latanoprost, levothyroxine, lidocaine, loratadine, losartan,  metoprolol tartrate, naloxone, pregabalin, promethazine, senna-docusate, simvastatin, and tiZANidine  History Review  Allergy: [name] is allergic to celebrex [celecoxib], codeine, and penicillins. Drug: [name]  reports no history of drug use. Alcohol:  reports no history of alcohol use. Tobacco:  reports that he has been smoking cigarettes. He has a 44.25 pack-year smoking history. He has never used smokeless tobacco. Social: [name]  reports that he has been smoking cigarettes. He has a 44.25 pack-year smoking history. He has never used smokeless tobacco. He reports that he does not drink alcohol and does not use drugs. Medical:  has a past medical history of Acute anterolateral wall MI ([place]) (2000), Acute non-Q wave non-ST elevation myocardial infarction (NSTEMI) ([place]) (12/03/2004), Adenomatous polyp of colon, Agent orange exposure, Angina pectoris ([place]), Anxiety, Aortic atherosclerosis ([place]), Asthma, Bladder stones, Bradycardia, CHF (congestive heart failure) ([place]), Chronic lower back pain, Chronic pain syndrome, COPD (chronic obstructive pulmonary disease) ([place]), Coronary artery disease, Depression, DJD (degenerative joint disease), DVT of lower extremity, bilateral ([place]), Dyspnea, GERD (gastroesophageal reflux disease), History of hiatal hernia, HLD (hyperlipidemia), Hypertension, Hypothyroidism, Inflammatory polyarthropathy ([place]), Internal hemorrhoids, Long term prescription opiate use, 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 Surgical: [name]  has a past surgical history that includes Coronary angioplasty; Posterior lumbar fusion (N/A, 1999); Nissen fundoplication (N/A, 3159); Mastoidectomy; Partial colectomy (N/A); Intrathecal pump implantation (N/A,  03/2008); Intrathecal pump revision (N/A, 09/28/2014); Transurethral resection of bladder tumor (N/A, 03/07/2015); Colonoscopy with propofol (N/A, 02/27/2018); External ear surgery; Skin cancer excision; IVC FILTER PLACEMENT ([facility]) (N/A, 09/2007); IVC FILTER REMOVAL (N/A, 2011); Colonoscopy (N/A, 11/22/2013); Colonoscopy (N/A, 09/19/2008); Colonoscopy (N/A, 08/25/2003); Colonoscopy (N/A, 10/15/1994); Posterior laminectomy / decompression cervical spine (N/A, 1984); Posterior laminectomy / decompression cervical spine (N/A, 1985); Tonsillectomy (Bilateral, 1958); Coronary angioplasty with stent (Left, 12/04/2004); LEFT HEART CATH AND CORONARY ANGIOGRAPHY (Left, 02/19/2008); LEFT HEART CATH AND CORONARY ANGIOGRAPHY (Left, 04/18/1987); LEFT HEART CATH AND CORONARY ANGIOGRAPHY (Left, 02/03/2002); and Pain pump removal (N/A, 12/20/2021). Family: family history includes Emphysema in his father and sister; Heart attack in his father; Heart disease in his brother and mother.  Laboratory Chemistry Profile   Renal Lab Results  Component Value Date   BUN 10 01/30/2022   CREATININE 0.91 01/30/2022   BCR 11 01/30/2022   GFRAA >60 03/02/2015   GFRNONAA >60 11/01/2021    Hepatic Lab Results  Component Value Date   AST 31 01/30/2022   ALBUMIN 4.0 01/30/2022   ALKPHOS 96 01/30/2022    Electrolytes Lab Results  Component Value Date   NA 145 (H) 01/30/2022   K 5.2 01/30/2022   CL 104 01/30/2022   CALCIUM 9.6 01/30/2022   MG 2.3 11/01/2021   PHOS 3.7 11/01/2021    Bone Lab Results  Component Value Date   25OHVITD1 22 (L) 02/07/2021   25OHVITD2 <1.0 02/07/2021   25OHVITD3 22 02/07/2021    Inflammation (CRP: Acute Phase) (ESR: Chronic Phase) Lab Results  Component  Value Date   CRP 5 01/30/2022   ESRSEDRATE 11 01/30/2022   LATICACIDVEN 0.9 10/28/2021         Note: Above Lab results reviewed.  Recent Imaging Review  MR LUMBAR SPINE WO CONTRAST CLINICAL DATA:  Low back pain and bilateral leg pain for 3 weeks. History of surgery in 1999.  EXAM: MRI LUMBAR SPINE WITHOUT CONTRAST  TECHNIQUE: Multiplanar, multisequence MR imaging of the lumbar spine was performed. No intravenous contrast was administered.  COMPARISON:  CT abdomen/pelvis 01/25/2022, lumbar spine MRI 10/21/2005  FINDINGS: Segmentation: Standard; the lowest formed disc space is designated L5-S1  Alignment: There is grade 1 anterolisthesis of L4 on L5, unchanged since the recent prior CT abdomen/pelvis but new since 2007.  Vertebrae: Vertebral body heights are preserved. Background marrow signal is normal. There is no suspicious marrow signal abnormality or marrow edema.  A focus of T1 and T2 hyperintensity in the right iliac bone is unchanged since 2007, benign. A similar appearing lesion in the left iliac bone is also likely benign.  Conus medullaris and cauda equina: Conus extends to the L1 level. Conus and cauda equina appear normal.  Paraspinal and other soft tissues: Unremarkable.  Disc levels:  T12-L1: No significant spinal canal or neural foraminal stenosis  L1-L2: There is a shallow central protrusion and mild facet arthropathy without significant spinal canal or neural foraminal stenosis.  L2-L3: There is a disc bulge eccentric to the left and mild bilateral facet arthropathy resulting in narrowing of the left subarticular zone with potential mass effect on the traversing left L3 nerve root (8-17, 5-11), and no significant neural foraminal stenosis  L3-L4: There is a diffuse disc bulge with a prominent left foraminal/extraforaminal protrusion/extrusion and bilateral facet arthropathy with ligamentum flavum thickening resulting in moderate spinal  canal stenosis with bilateral subarticular zone narrowing, left worse than right, without evidence of frank impingement and moderate left and mild right neural  foraminal stenosis with contact and probable displacement of the exiting left L3 nerve root and possible contact of the exiting right L3 nerve root (5-14, 5-4).  L4-L5: There is grade 1 anterolisthesis with uncovering of the disc posteriorly. Suspect decompressive laminectomies at this level. There is a mild disc bulge and moderate facet arthropathy resulting in moderate spinal canal stenosis with left worse than right subarticular zone narrowing but no evidence of frank impingement and moderate left and mild right neural foraminal stenosis  L5-S1: Status post decompressive laminectomies. There is bilateral facet arthropathy without significant spinal canal or neural foraminal stenosis.  IMPRESSION: 1. L2-L3: Disc bulge eccentric to the left resulting in narrowing of the left subarticular zone with possible impingement of the traversing left L3 nerve root. 2. L3-L4: Disc bulge with a prominent left foraminal/extraforaminal protrusion and bilateral facet arthropathy resulting in moderate spinal canal stenosis with left worse than right subarticular zone narrowing and moderate left and mild right neural foraminal stenosis with contact and possible displacement of either or both exiting L3 nerve roots, left worse than right. 3. L4-L5: Suspect decompressive laminectomies. Grade 1 anterolisthesis and bulky facet arthropathy results in moderate spinal canal stenosis with left worse than right subarticular zone narrowing and moderate left neural foraminal stenosis. 4. L5-S1: Decompressive laminectomies with bilateral facet arthropathy no significant spinal canal or neural foraminal stenosis.  Electronically Signed   By: Valetta Mole M.D.   On: 02/12/2022 12:14 Note: Reviewed        Physical Exam  General appearance: Well  nourished, well developed, and well hydrated. In no apparent acute distress Mental status: Alert, oriented x 3 (person, place, & time)       Respiratory: No evidence of acute respiratory distress Eyes: PERLA Vitals: There were no vitals taken for this visit. BMI: Estimated body mass index is 25 kg/m as calculated from the following:   Height as of 01/30/22: '6\' 3"'  (1.905 m).   Weight as of 01/30/22: 200 lb (90.7 kg). Ideal: Patient weight not recorded  Assessment   Diagnosis Status  No diagnosis found. Controlled Controlled Controlled   Updated Problems: No problems updated.  Plan of Care  Problem-specific:  No problem-specific Assessment & Plan notes found for this encounter.  [name] has a current medication list which includes the following long-term medication(s): albuterol, amitriptyline, azelastine, cephalexin, duloxetine, hydromorphone, hydromorphone, hydromorphone, levothyroxine, loratadine, losartan, metoprolol tartrate, pregabalin, and simvastatin.  Pharmacotherapy (Medications Ordered): No orders of the defined types were placed in this encounter.  Orders:  No orders of the defined types were placed in this encounter.  Follow-up plan:   No follow-ups on file.     Interventional Therapies  Risk  Complexity Considerations:   Estimated body mass index is 27.6 kg/m as calculated from the following:   Height as of this encounter: '6\' 2"'  (1.88 m).   Weight as of this encounter: 215 lb (97.5 kg). WNL   Planned  Pending:   Intrathecal catheter and pump removal (2ry to end of battery life & catheter malfunction)    Under consideration:   Diagnostic intrathecal pump catheter test/evaluation  Diagnostic/therapeutic bilateral lumbar facet MBB #1  Therapeutic/palliative left L4-5 LESI #1    Completed:   Diagnostic left caudal ESI and epidurogram x1 (04/24/2021) (LBP: 50/50/0  LEP; 100/100/50-60)  Diagnostic intrathecal pump catheter test/evaluation x1  (06/21/2021) (Dx: Catheter malfunction/dysfunction)  Referral (02/07/2021 & 09/19/2021) to [facility] neurosurgery Deetta Perla, MD) for ITP replacement. Cardiology clearance to Dr. Clayborn Bigness requested (09/19/2021)  Therapeutic  Palliative (PRN) options:   None established      Recent Visits Date Type Provider Dept  01/30/22 Office Visit Milinda Pointer, MD [facility]  12/31/21 Office Visit Milinda Pointer, MD [facility]  Showing recent visits within past 90 days and meeting all other requirements Future Appointments Date Type Provider Dept  03/20/22 Appointment Milinda Pointer, MD [facility]  Showing future appointments within next 90 days and meeting all other requirements  I discussed the assessment and treatment plan with the patient. The patient was provided an opportunity to ask questions and all were answered. The patient agreed with the plan and demonstrated an understanding of the instructions.  Patient advised to call back or seek an in-person evaluation if the symptoms or condition worsens.  Duration of encounter: *** minutes.  Total time on encounter, as per AMA guidelines included both the face-to-face and non-face-to-face time personally spent by the physician and/or other qualified health care professional(s) on the day of the encounter (includes time in activities that require the physician or other qualified health care professional and does not include time in activities normally performed by clinical staff). Physician's time may include the following activities when performed: preparing to see the patient (eg, review of tests, pre-charting review of records) obtaining and/or reviewing separately obtained history performing a medically appropriate examination and/or evaluation counseling and educating the patient/family/caregiver ordering medications, tests, or procedures referring and communicating with other health care  professionals (when not separately reported) documenting clinical information in the electronic or other health record independently interpreting results (not separately reported) and communicating results to the patient/ family/caregiver care coordination (not separately reported)  Note by: Gabriel Cola, MD Date: 03/20/2022; Time: 9:15 AM

## 2022-03-20 ENCOUNTER — Ambulatory Visit: Payer: Medicare (Managed Care) | Attending: Pain Medicine | Admitting: Pain Medicine

## 2022-03-20 ENCOUNTER — Encounter: Payer: Self-pay | Admitting: Pain Medicine

## 2022-03-20 VITALS — BP 117/80 | HR 69 | Temp 97.9°F | Resp 20 | Ht 74.5 in | Wt 200.0 lb

## 2022-03-20 DIAGNOSIS — Z79891 Long term (current) use of opiate analgesic: Secondary | ICD-10-CM | POA: Insufficient documentation

## 2022-03-20 DIAGNOSIS — G8929 Other chronic pain: Secondary | ICD-10-CM | POA: Diagnosis present

## 2022-03-20 DIAGNOSIS — M25561 Pain in right knee: Secondary | ICD-10-CM | POA: Diagnosis present

## 2022-03-20 DIAGNOSIS — M5442 Lumbago with sciatica, left side: Secondary | ICD-10-CM | POA: Insufficient documentation

## 2022-03-20 DIAGNOSIS — G63 Polyneuropathy in diseases classified elsewhere: Secondary | ICD-10-CM | POA: Diagnosis present

## 2022-03-20 DIAGNOSIS — M5441 Lumbago with sciatica, right side: Secondary | ICD-10-CM | POA: Insufficient documentation

## 2022-03-20 DIAGNOSIS — G894 Chronic pain syndrome: Secondary | ICD-10-CM | POA: Diagnosis present

## 2022-03-20 DIAGNOSIS — Z79899 Other long term (current) drug therapy: Secondary | ICD-10-CM | POA: Diagnosis present

## 2022-03-20 DIAGNOSIS — M25562 Pain in left knee: Secondary | ICD-10-CM | POA: Diagnosis present

## 2022-03-20 DIAGNOSIS — M792 Neuralgia and neuritis, unspecified: Secondary | ICD-10-CM | POA: Diagnosis present

## 2022-03-20 DIAGNOSIS — M961 Postlaminectomy syndrome, not elsewhere classified: Secondary | ICD-10-CM | POA: Diagnosis present

## 2022-03-20 DIAGNOSIS — M79672 Pain in left foot: Secondary | ICD-10-CM | POA: Insufficient documentation

## 2022-03-20 DIAGNOSIS — M79604 Pain in right leg: Secondary | ICD-10-CM | POA: Diagnosis present

## 2022-03-20 DIAGNOSIS — M79605 Pain in left leg: Secondary | ICD-10-CM | POA: Diagnosis present

## 2022-03-20 DIAGNOSIS — M79671 Pain in right foot: Secondary | ICD-10-CM | POA: Diagnosis present

## 2022-03-20 MED ORDER — HYDROMORPHONE HCL 4 MG PO TABS: 4.0000 mg | ORAL_TABLET | Freq: Three times a day (TID) | ORAL | 0 refills | Status: DC | PRN

## 2022-03-20 MED ORDER — PREGABALIN 75 MG PO CAPS: 75.0000 mg | ORAL_CAPSULE | Freq: Three times a day (TID) | ORAL | 5 refills | Status: DC

## 2022-03-20 NOTE — Progress Notes (Signed)
Nursing Pain Medication Assessment:  Safety precautions to be maintained throughout the outpatient stay will include: orient to surroundings, keep bed in low position, maintain call bell within reach at all times, provide assistance with transfer out of bed and ambulation.  Medication Inspection Compliance: Pill count conducted under aseptic conditions, in front of the patient. Neither the pills nor the bottle was removed from the patient's sight at any time. Once count was completed pills were immediately returned to the patient in their original bottle.  Medication: Hydromorphone (Dilaudid) Pill/Patch Count:  66 of 90 pills remain Pill/Patch Appearance: Markings consistent with prescribed medication Bottle Appearance: Standard pharmacy container. Clearly labeled. Filled Date: 8 / 47 / 2023 Last Medication intake:  Today

## 2022-03-20 NOTE — Patient Instructions (Signed)

## 2022-04-24 ENCOUNTER — Ambulatory Visit: Payer: Medicare (Managed Care) | Attending: Cardiology | Admitting: Cardiology

## 2022-04-24 DIAGNOSIS — I5022 Chronic systolic (congestive) heart failure: Secondary | ICD-10-CM

## 2022-04-24 DIAGNOSIS — I82409 Acute embolism and thrombosis of unspecified deep veins of unspecified lower extremity: Secondary | ICD-10-CM

## 2022-04-24 DIAGNOSIS — I480 Paroxysmal atrial fibrillation: Secondary | ICD-10-CM

## 2022-04-24 DIAGNOSIS — I493 Ventricular premature depolarization: Secondary | ICD-10-CM

## 2022-04-25 ENCOUNTER — Encounter: Payer: Self-pay | Admitting: Cardiology

## 2022-05-20 ENCOUNTER — Encounter (INDEPENDENT_AMBULATORY_CARE_PROVIDER_SITE_OTHER): Payer: Self-pay

## 2022-06-15 NOTE — Progress Notes (Signed)
PROVIDER NOTE: Information contained herein reflects review and annotations entered in association with encounter. Interpretation of such information and data should be left to medically-trained personnel. Information provided to patient can be located elsewhere in the medical record under "Patient Instructions". Document created using STT-dictation technology, any transcriptional errors that may result from process are unintentional.    Patient: [name]  Service Category: E/M  Provider: [name], MD  DOB: 03/16/1946  DOS: 06/17/2022  Referring Provider: Adin Hector, MD  MRN: [id number]  Specialty: Interventional Pain Management  PCP: Adin Hector, MD  Type: Established Patient  Setting: Ambulatory outpatient    Location: Office  Delivery: Face-to-face     HPI  [name], a 76 y.o. year old male, is here today because of his Chronic pain syndrome [G89.4]. [name] primary complain today is Back Pain Last encounter: My last encounter with him was on 03/20/2022. 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[place]); Chronic ankle pain (Bilateral); Pain in joint, ankle and foot; Polyneuropathy associated with underlying disease ([place]); Presence of intrathecal pump; Ankle sprain; Rotator cuff sprain; Lumbosacral radiculopathy at S1 (Bilateral) (R>L); Chronic feet pain (3ry area of Pain) (Bilateral) (R>L); Chronic knee pain (4th area of Pain) (Bilateral) (R>L); Failed back surgical syndrome; Lower extremity weakness (Bilateral); Chronic neuropathic pain; Peripheral neurogenic pain; Nonfamilial nocturnal leg cramps (Bilateral); Restless leg syndrome; Pain in right knee; DDD (degenerative disc  disease), lumbosacral; Abnormal MRI, lumbar spine (11/01/2019); 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; Lumbar facet arthropathy (Multilevel) (Bilateral); Lumbosacral facet syndrome (Bilateral); Lumbar foraminal narrowing (Multilevel) (Bilateral); Cervicalgia; Chronic shoulder pain (Bilateral) (R>L); Radicular pain of shoulder; Occipital neuralgia (Right); Cervico-occipital neuralgia (Right); Cervical facet hypertrophy (Multilevel) (Bilateral); Cervical facet pain; Painful cervical range of motion; Chronic neurogenic pain; Left rib fracture; Chronic pain; Foot pain; Lumbago; 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 Pain Assessment: Severity of Chronic pain is reported as a 8 /10. Location: Back Right, Left/radiates down right leg to foot in the back. Onset: More than a month ago. Quality: Sharp. Timing: Intermittent. Modifying factor(s): rest. Vitals:  height is _0  (1.905 m) and weight is 210 lb (95.3 kg). His temperature is 98 F (36.7 C). His blood pressure is 156/92 (abnormal) and his pulse is 79. His respiration is 16 and oxygen saturation is 100%.   Reason for encounter: medication management.  The patient indicates doing well with the current medication regimen. No adverse reactions or side effects reported to the medications.   For some reason the patient was under the impression that he had no more prescriptions left, but according to my calculations, he should still have another prescription to take him until 06/26/2022.  He refers having taken his last pill last night.  According to my notes, on 03/20/2022 we sent 3 prescriptions to the [facility] in [address].  The last of these 3 prescriptions.  Apparently part of the problem is that my prescriptions were discontinued  by Pricilla Handler, [place] on 04/24/2022, citing the reason to be "duplicate" prescriptions, which they were not.  RTCB: 07/26/2022  Nonopioids transferred (03/20/2022): Pregabalin (Lyrica) 75 mg TID.  Pharmacotherapy Assessment  Analgesic: Hydromorphone 4 mg tablet, 1 tab p.o. 3 times daily MME/day: 60 mg/day    Monitoring: [place] PMP: PDMP reviewed during this encounter.       Pharmacotherapy: No side-effects or adverse reactions reported. Compliance: No problems identified. Effectiveness: Clinically acceptable.  Dewayne Shorter, RN  06/17/2022 12:55 PM  Sign when Signing Visit Nursing Pain Medication Assessment:  Safety precautions to be maintained throughout the outpatient stay will include: orient to surroundings, keep bed in low position, maintain call bell within reach at all times, provide assistance with transfer out of bed and ambulation.  Medication Inspection Compliance: [name] did not comply with our request to bring his pills to be counted. He was reminded that bringing the medication bottles, even when empty, is a requirement.  Medication: None brought in. Pill/Patch Count: None available to be counted. Bottle Appearance: No container available. Did not bring bottle(s) to appointment. Filled Date: N/A Last Medication intake:  Yesterday    No results found for: "CBDTHCR" No results found for: "D8THCCBX" No results found for: "D9THCCBX"  UDS:  Summary  Date Value Ref Range Status  09/19/2021 Note  Final    Comment:    ==================================================================== [employer] [facility] 13 (MW) ==================================================================== Test                             Result       Flag       Units  Drug Present and Declared for Prescription Verification   Hydromorphone                  1862         EXPECTED   ng/mg creat    Hydromorphone may be administered as a scheduled prescription    medication; it is also an expected  metabolite of hydrocodone.  ==================================================================== Test                      Result    Flag   Units      Ref Range   Creatinine              26               mg/dL      >=20 ==================================================================== Declared Medications:  The flagging and interpretation on this report are based on the  following declared medications.  Unexpected results may arise from  inaccuracies in the declared medications.   **Note: The testing scope of this panel includes these medications:   Hydromorphone (Dilaudid)   **Note: The testing scope of this panel does not include the  following reported medications:   Albuterol (Proair HFA)  Amitriptyline (Elavil)  Azelastine  Bisacodyl  Clonidine (Catapres)  Duloxetine (Cymbalta)  Eye Drop  Levothyroxine (Synthroid)  Losartan (Cozaar)  Multivitamin  Naloxone (Narcan)  Olodaterol  Pregabalin (Lyrica)  Promethazine (Phenergan)  Simvastatin (Zocor)  Tiotropium ==================================================================== For clinical consultation, please call [phone]. ====================================================================       ROS  Constitutional: Denies any fever or chills Gastrointestinal: No reported hemesis, hematochezia, vomiting, or acute GI distress Musculoskeletal: Denies any acute onset joint swelling, redness, loss of ROM, or weakness Neurological: No reported episodes of acute onset apraxia, aphasia, dysarthria, agnosia, amnesia, paralysis, loss of coordination, or loss of consciousness  Medication Review  DULoxetine, HYDROmorphone, albuterol, amitriptyline, apixaban, aspirin EC, azelastine, bisacodyl, cloNIDine, latanoprost, levothyroxine,  lidocaine, loratadine, losartan, metoprolol succinate, metoprolol tartrate, naloxone, pregabalin, promethazine, senna-docusate, simvastatin, spironolactone, and tiZANidine  History Review   Allergy: [name] is allergic to celebrex [celecoxib], codeine, and penicillins. Drug: [name]  reports no history of drug use. Alcohol:  reports no history of alcohol use. Tobacco:  reports that he has been smoking cigarettes. He has a 44.25 pack-year smoking history. He has never used smokeless tobacco. Social: [name]  reports that he has been smoking cigarettes. He has a 44.25 pack-year smoking history. He has never used smokeless tobacco. He reports that he does not drink alcohol and does not use drugs. Medical:  has a past medical history of Acute anterolateral wall MI ([place]) (2000), Acute non-Q wave non-ST elevation myocardial infarction (NSTEMI) ([place]) (12/03/2004), Adenomatous polyp of colon, Agent orange exposure, Angina pectoris ([place]), Anxiety, Aortic atherosclerosis ([place]), Asthma, Bladder stones, Bradycardia, CHF (congestive heart failure) ([place]), Chronic lower back pain, Chronic pain syndrome, COPD (chronic obstructive pulmonary disease) ([place]), Coronary artery disease, Depression, DJD (degenerative joint disease), DVT of lower extremity, bilateral ([place]), Dyspnea, GERD (gastroesophageal reflux disease), History of hiatal hernia, HLD (hyperlipidemia), Hypertension, Hypothyroidism, Inflammatory polyarthropathy ([place]), Internal hemorrhoids, Long term prescription opiate use, 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 Surgical: Mr. Buenger  has a past surgical history that includes Coronary angioplasty; Posterior lumbar fusion (N/A,  1999); Nissen fundoplication (N/A, 7026); Mastoidectomy; Partial colectomy (N/A); Intrathecal pump implantation (N/A, 03/2008); Intrathecal pump revision (N/A, 09/28/2014); Transurethral resection of bladder tumor (N/A, 03/07/2015); Colonoscopy with propofol (N/A, 02/27/2018); External ear surgery; Skin cancer excision; IVC FILTER PLACEMENT ([facility] HX) (N/A, 09/2007); IVC FILTER REMOVAL (N/A, 2011); Colonoscopy (N/A, 11/22/2013); Colonoscopy (N/A, 09/19/2008); Colonoscopy (N/A, 08/25/2003); Colonoscopy (N/A, 10/15/1994); Posterior laminectomy / decompression cervical spine (N/A, 1984); Posterior laminectomy / decompression cervical spine (N/A, 1985); Tonsillectomy (Bilateral, 1958); Coronary angioplasty with stent (Left, 12/04/2004); LEFT HEART CATH AND CORONARY ANGIOGRAPHY (Left, 02/19/2008); LEFT HEART CATH AND CORONARY ANGIOGRAPHY (Left, 04/18/1987); LEFT HEART CATH AND CORONARY ANGIOGRAPHY (Left, 02/03/2002); and Pain pump removal (N/A, 12/20/2021). Family: family history includes Emphysema in his father and sister; Heart attack in his father; Heart disease in his brother and mother.  Laboratory Chemistry Profile   Renal Lab Results  Component Value Date   BUN 10 01/30/2022   CREATININE 0.91 01/30/2022   BCR 11 01/30/2022   GFRAA >60 03/02/2015   GFRNONAA >60 11/01/2021    Hepatic Lab Results  Component Value Date   AST 31 01/30/2022   ALBUMIN 4.0 01/30/2022   ALKPHOS 96 01/30/2022    Electrolytes Lab Results  Component Value Date   NA 145 (H) 01/30/2022   K 5.2 01/30/2022   CL 104 01/30/2022   CALCIUM 9.6 01/30/2022   MG 2.3 11/01/2021   PHOS 3.7 11/01/2021    Bone Lab Results  Component Value Date   25OHVITD1 22 (L) 02/07/2021   25OHVITD2 <1.0 02/07/2021   25OHVITD3 22 02/07/2021    Inflammation (CRP: Acute Phase) (ESR: Chronic Phase) Lab Results  Component Value Date   CRP 5 01/30/2022   ESRSEDRATE 11 01/30/2022   LATICACIDVEN 0.9 10/28/2021         Note: Above Lab  results reviewed.  Recent  Imaging Review  MR LUMBAR SPINE WO CONTRAST CLINICAL DATA:  Low back pain and bilateral leg pain for 3 weeks. History of surgery in 1999.  EXAM: MRI LUMBAR SPINE WITHOUT CONTRAST  TECHNIQUE: Multiplanar, multisequence MR imaging of the lumbar spine was performed. No intravenous contrast was administered.  COMPARISON:  CT abdomen/pelvis 01/25/2022, lumbar spine MRI 10/21/2005  FINDINGS: Segmentation: Standard; the lowest formed disc space is designated L5-S1  Alignment: There is grade 1 anterolisthesis of L4 on L5, unchanged since the recent prior CT abdomen/pelvis but new since 2007.  Vertebrae: Vertebral body heights are preserved. Background marrow signal is normal. There is no suspicious marrow signal abnormality or marrow edema.  A focus of T1 and T2 hyperintensity in the right iliac bone is unchanged since 2007, benign. A similar appearing lesion in the left iliac bone is also likely benign.  Conus medullaris and cauda equina: Conus extends to the L1 level. Conus and cauda equina appear normal.  Paraspinal and other soft tissues: Unremarkable.  Disc levels:  T12-L1: No significant spinal canal or neural foraminal stenosis  L1-L2: There is a shallow central protrusion and mild facet arthropathy without significant spinal canal or neural foraminal stenosis.  L2-L3: There is a disc bulge eccentric to the left and mild bilateral facet arthropathy resulting in narrowing of the left subarticular zone with potential mass effect on the traversing left L3 nerve root (8-17, 5-11), and no significant neural foraminal stenosis  L3-L4: There is a diffuse disc bulge with a prominent left foraminal/extraforaminal protrusion/extrusion and bilateral facet arthropathy with ligamentum flavum thickening resulting in moderate spinal canal stenosis with bilateral subarticular zone narrowing, left worse than right, without evidence of frank impingement  and moderate left and mild right neural foraminal stenosis with contact and probable displacement of the exiting left L3 nerve root and possible contact of the exiting right L3 nerve root (5-14, 5-4).  L4-L5: There is grade 1 anterolisthesis with uncovering of the disc posteriorly. Suspect decompressive laminectomies at this level. There is a mild disc bulge and moderate facet arthropathy resulting in moderate spinal canal stenosis with left worse than right subarticular zone narrowing but no evidence of frank impingement and moderate left and mild right neural foraminal stenosis  L5-S1: Status post decompressive laminectomies. There is bilateral facet arthropathy without significant spinal canal or neural foraminal stenosis.  IMPRESSION: 1. L2-L3: Disc bulge eccentric to the left resulting in narrowing of the left subarticular zone with possible impingement of the traversing left L3 nerve root. 2. L3-L4: Disc bulge with a prominent left foraminal/extraforaminal protrusion and bilateral facet arthropathy resulting in moderate spinal canal stenosis with left worse than right subarticular zone narrowing and moderate left and mild right neural foraminal stenosis with contact and possible displacement of either or both exiting L3 nerve roots, left worse than right. 3. L4-L5: Suspect decompressive laminectomies. Grade 1 anterolisthesis and bulky facet arthropathy results in moderate spinal canal stenosis with left worse than right subarticular zone narrowing and moderate left neural foraminal stenosis. 4. L5-S1: Decompressive laminectomies with bilateral facet arthropathy no significant spinal canal or neural foraminal stenosis.  Electronically Signed   By: Valetta Mole M.D.   On: 02/12/2022 12:14 Note: Reviewed        Physical Exam  General appearance: Well nourished, well developed, and well hydrated. In no apparent acute distress Mental status: Alert, oriented x 3 (person,  place, & time)       Respiratory: No evidence of acute respiratory distress Eyes: PERLA Vitals: BP Marland Kitchen)  156/92   Pulse 79   Temp 98 F (36.7 C)   Resp 16   Ht _0  (1.905 m)   Wt 210 lb (95.3 kg)   SpO2 100%   BMI 26.25 kg/m  BMI: Estimated body mass index is 26.25 kg/m as calculated from the following:   Height as of this encounter: _1  (1.905 m).   Weight as of this encounter: 210 lb (95.3 kg). Ideal: Ideal body weight: 84.5 kg (186 lb 4.6 oz) Adjusted ideal body weight: 88.8 kg (195 lb 12.4 oz)  Assessment   Diagnosis Status  1. Chronic pain syndrome   2. 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 Chronic lower extremity pain (2ry area of Pain) (Bilateral) (R>L)   4. Chronic feet pain (3ry area of Pain) (Bilateral) (R>L)   5. Chronic knee pain (4th area of Pain) (Bilateral) (R>L)   6. Lumbar post-laminectomy syndrome   7. Failed back surgical syndrome   8. Pharmacologic therapy   9. Chronic use of opiate for therapeutic purpose   10. Encounter for medication management   11. Encounter for chronic pain management    Controlled Controlled Controlled   Updated Problems: No problems updated.  Plan of Care  Problem-specific:  No problem-specific Assessment & Plan notes found for this encounter.  [name] has a current medication list which includes the following long-term medication(s): albuterol, amitriptyline, azelastine, duloxetine, eliquis, [START ON 06/26/2022] hydromorphone, levothyroxine, loratadine, losartan, metoprolol tartrate, pregabalin, simvastatin, spironolactone, and hydromorphone.  Pharmacotherapy (Medications Ordered): Meds ordered this encounter  Medications   HYDROmorphone (DILAUDID) 4 MG tablet    Sig: Take 1 tablet (4 mg total) by mouth every 8 (eight) hours as needed. Must last 30 days.    Dispense:  90 tablet    Refill:  0    DO NOT: delete (not duplicate); no partial-fill (will deny  script to complete), no refill request (F/U required). DISPENSE: 1 day early if closed on fill date. WARN: No CNS-depressants within 8 hrs of med.   naloxone (NARCAN) nasal spray 4 mg/0.1 mL    Sig: Place 1 spray into the nose as needed for up to 365 doses (for opioid-induced respiratory depresssion). In case of emergency (overdose), spray once into each nostril. If no response within 3 minutes, repeat application and call [phone].    Dispense:  1 each    Refill:  0    Instruct patient in proper use of device.   HYDROmorphone (DILAUDID) 4 MG tablet    Sig: Take 1 tablet (4 mg total) by mouth every 8 (eight) hours as needed for up to 9 days. Must last 30 days.    Dispense:  27 tablet    Refill:  0    DO NOT: delete (not duplicate); no partial-fill (will deny script to complete), no refill request (F/U required). DISPENSE: 1 day early if closed on fill date. WARN: No CNS-depressants within 8 hrs of med.   Orders:  No orders of the defined types were placed in this encounter.  Follow-up plan:   Return in about 6 weeks (around 07/26/2022) for Eval-day (M,W), (F2F), (MM).     Interventional Therapies  Risk  Complexity Considerations:   Estimated body mass index is 27.6 kg/m as calculated from the following:   Height as of this encounter: _2  (1.88 m).   Weight as of this encounter: 215 lb (97.5 kg). WNL   Planned  Pending:  Intrathecal catheter and pump removal (2ry to end of battery life & catheter malfunction)    Under consideration:   Diagnostic intrathecal pump catheter test/evaluation  Diagnostic/therapeutic bilateral lumbar facet MBB #1  Therapeutic/palliative left L4-5 LESI #1    Completed:   Diagnostic left caudal ESI and epidurogram x1 (04/24/2021) (LBP: 50/50/0  LEP; 100/100/50-60)  Diagnostic intrathecal pump catheter test/evaluation x1 (06/21/2021) (Dx: Catheter malfunction/dysfunction)  Referral (02/07/2021 & 09/19/2021) to [facility] neurosurgery Deetta Perla, MD) for ITP  replacement. Cardiology clearance to Dr. Clayborn Bigness requested (09/19/2021)    Therapeutic  Palliative (PRN) options:   None established   Pharmacological Recommendations:   Nonopioids transferred (03/20/2022): Pregabalin (Lyrica) 75 mg TID.     Recent Visits Date Type Provider Dept  03/20/22 Office Visit Milinda Pointer, MD [facility]  Showing recent visits within past 90 days and meeting all other requirements Today's Visits Date Type Provider Dept  06/17/22 Office Visit Milinda Pointer, MD [facility]  Showing today's visits and meeting all other requirements Future Appointments Date Type Provider Dept  07/03/22 Appointment Milinda Pointer, MD [facility]  Showing future appointments within next 90 days and meeting all other requirements  I discussed the assessment and treatment plan with the patient. The patient was provided an opportunity to ask questions and all were answered. The patient agreed with the plan and demonstrated an understanding of the instructions.  Patient advised to call back or seek an in-person evaluation if the symptoms or condition worsens.  Duration of encounter: 30 minutes.  Total time on encounter, as per AMA guidelines included both the face-to-face and non-face-to-face time personally spent by the physician and/or other qualified health care professional(s) on the day of the encounter (includes time in activities that require the physician or other qualified health care professional and does not include time in activities normally performed by clinical staff). Physician's time may include the following activities when performed: preparing to see the patient (eg, review of tests, pre-charting review of records) obtaining and/or reviewing separately obtained history performing a medically appropriate examination and/or evaluation counseling and educating the patient/family/caregiver ordering medications, tests, or  procedures referring and communicating with other health care professionals (when not separately reported) documenting clinical information in the electronic or other health record independently interpreting results (not separately reported) and communicating results to the patient/ family/caregiver care coordination (not separately reported)  Note by: [name], MD Date: 06/17/2022; Time: 1:37 PM

## 2022-06-16 NOTE — Patient Instructions (Signed)
____________________________________________________________________________________________  Patient Information update  To: All of our patients.  Re: Name change.  It has been made official that our current name, "[facility]"   will soon be changed to "[facility]".   The purpose of this change is to eliminate any confusion created by the concept of our practice being a "Medication Management Pain Clinic". In the past this has led to the misconception that we treat pain primarily by the use of prescription medications.  Nothing can be farther from the truth.   Understanding PAIN MANAGEMENT: To further understand what our practice does, you first have to understand that "Pain Management" is a subspecialty that requires additional training once a physician has completed their specialty training, which can be in either Anesthesia, Neurology, Psychiatry, or Physical Medicine and Rehabilitation (PMR). Each one of these contributes to the final approach taken by each physician to the management of their patient's pain. To be a "Pain Management Specialist" you must have first completed one of the specialty trainings below.  Anesthesiologists - trained in clinical pharmacology and interventional techniques such as nerve blockade and regional as well as central neuroanatomy. They are trained to block pain before, during, and after surgical interventions.  Neurologists - trained in the diagnosis and pharmacological treatment of complex neurological conditions, such as Multiple Sclerosis, Parkinson's, spinal cord injuries, and other systemic conditions that may be associated with symptoms that may include but are not limited to pain. They tend to rely primarily on the treatment of chronic pain using prescription medications.  Psychiatrist - trained in conditions affecting the psychosocial  wellbeing of patients including but not limited to depression, anxiety, schizophrenia, personality disorders, addiction, and other substance use disorders that may be associated with chronic pain. They tend to rely primarily on the treatment of chronic pain using prescription medications.   Physical Medicine and Rehabilitation (PMR) physicians, also known as physiatrists - trained to treat a wide variety of medical conditions affecting the brain, spinal cord, nerves, bones, joints, ligaments, muscles, and tendons. Their training is primarily aimed at treating patients that have suffered injuries that have caused severe physical impairment. Their training is primarily aimed at the physical therapy and rehabilitation of those patients. They may also work alongside orthopedic surgeons or neurosurgeons using their expertise in assisting surgical patients to recover after their surgeries.  INTERVENTIONAL PAIN MANAGEMENT is sub-subspecialty of Pain Management.  Our physicians are Board-certified in Anesthesia, Pain Management, and Interventional Pain Management.  This meaning that not only have they been trained and Board-certified in their specialty of Anesthesia, and subspecialty of Pain Management, but they have also received further training in the sub-subspecialty of Interventional Pain Management, in order to become Board-certified as INTERVENTIONAL PAIN MANAGEMENT SPECIALIST.    Mission: Our goal is to use our skills in  INTERVENTIONAL PAIN MANAGEMENT as alternatives to the chronic use of prescription opioid medications for the treatment of pain. To make this more clear, we have changed our name to reflect what we do and offer. We will continue to offer medication management assessment and recommendations, but we will not be taking over any patient's medication management.  ____________________________________________________________________________________________      _______________________________________________________________________  Medication Rules  Purpose: To inform patients, and their family members, of our medication rules and regulations.  Applies to: All patients receiving prescriptions from our practice (written or electronic).  Pharmacy of record: This is the pharmacy where your electronic prescriptions   will be sent. Make sure we have the correct one.  Electronic prescriptions: In compliance with the [place] Strengthen Opioid Misuse Prevention (STOP) Act of 2017 (Session Law 2017-74/H243), effective July 22, 2018, all controlled substances must be electronically prescribed. Written prescriptions, faxing, or calling prescriptions to a pharmacy will no longer be done.  Prescription refills: These will be provided only during in-person appointments. No medications will be renewed without a "face-to-face" evaluation with your provider. Applies to all prescriptions.  NOTE: The following applies primarily to controlled substances (Opioid* Pain Medications).   Type of encounter (visit): For patients receiving controlled substances, face-to-face visits are required. (Not an option and not up to the patient.)  Patient's responsibilities: Pain Pills: Bring all pain pills to every appointment (except for procedure appointments). Pill Bottles: Bring pills in original pharmacy bottle. Bring bottle, even if empty. Always bring the bottle of the most recent fill.  Medication refills: You are responsible for knowing and keeping track of what medications you are taking and when is it that you will need a refill. The day before your appointment: write a list of all prescriptions that need to be refilled. The day of the appointment: give the list to the admitting nurse. Prescriptions will be written only during appointments. No prescriptions will be written on procedure days. If you forget a medication: it will not be "Called in", "Faxed", or  "electronically sent". You will need to get another appointment to get these prescribed. No early refills. Do not call asking to have your prescription filled early. Partial  or short prescriptions: Occasionally your pharmacy may not have enough pills to fill your prescription.  NEVER ACCEPT a partial fill or a prescription that is short of the total amount of pills that you were prescribed.  With controlled substances the law allows 72 hours for the pharmacy to complete the prescription.  If the prescription is not completed within 72 hours, the pharmacist will require a new prescription to be written. This means that you will be short on your medicine and we WILL NOT send another prescription to complete your original prescription.  Instead, request the pharmacy to send a carrier to a nearby branch to get enough medication to provide you with your full prescription. Prescription Accuracy: You are responsible for carefully inspecting your prescriptions before leaving our office. Have the discharge nurse carefully go over each prescription with you, before taking them home. Make sure that your name is accurately spelled, that your address is correct. Check the name and dose of your medication to make sure it is accurate. Check the number of pills, and the written instructions to make sure they are clear and accurate. Make sure that you are given enough medication to last until your next medication refill appointment. Taking Medication: Take medication as prescribed. When it comes to controlled substances, taking less pills or less frequently than prescribed is permitted and encouraged. Never take more pills than instructed. Never take the medication more frequently than prescribed.  Inform other Doctors: Always inform, all of your healthcare providers, of all the medications you take. Pain Medication from other Providers: You are not allowed to accept any additional pain medication from any other Doctor or  Healthcare provider. There are two exceptions to this rule. (see below) In the event that you require additional pain medication, you are responsible for notifying us, as stated below. Cough Medicine: Often these contain an opioid, such as codeine or hydrocodone. Never accept or take cough medicine containing   these opioids if you are already taking an opioid* medication. The combination may cause respiratory failure and death. Medication Agreement: You are responsible for carefully reading and following our Medication Agreement. This must be signed before receiving any prescriptions from our practice. Safely store a copy of your signed Agreement. Violations to the Agreement will result in no further prescriptions. (Additional copies of our Medication Agreement are available upon request.) Laws, Rules, & Regulations: All patients are expected to follow all Federal and State Laws, Statutes, Rules, & Regulations. Ignorance of the Laws does not constitute a valid excuse.  Illegal drugs and Controlled Substances: The use of illegal substances (including, but not limited to marijuana and its derivatives) and/or the illegal use of any controlled substances is strictly prohibited. Violation of this rule may result in the immediate and permanent discontinuation of any and all prescriptions being written by our practice. The use of any illegal substances is prohibited. Adopted CDC guidelines & recommendations: Target dosing levels will be at or below 60 MME/day. Use of benzodiazepines** is not recommended.  Exceptions: There are only two exceptions to the rule of not receiving pain medications from other Healthcare Providers. Exception #1 (Emergencies): In the event of an emergency (i.e.: accident requiring emergency care), you are allowed to receive additional pain medication. However, you are responsible for: As soon as you are able, call our office [phone], at any time of the day or night, and leave a  message stating your name, the date and nature of the emergency, and the name and dose of the medication prescribed. In the event that your call is answered by a member of our staff, make sure to document and save the date, time, and the name of the person that took your information.  Exception #2 (Planned Surgery): In the event that you are scheduled by another doctor or dentist to have any type of surgery or procedure, you are allowed (for a period no longer than 30 days), to receive additional pain medication, for the acute post-op pain. However, in this case, you are responsible for picking up a copy of our "Post-op Pain Management for Surgeons" handout, and giving it to your surgeon or dentist. This document is available at our office, and does not require an appointment to obtain it. Simply go to our office during business hours (Monday-Thursday from 8:00 AM to 4:00 PM) (Friday 8:00 AM to 12:00 Noon) or if you have a scheduled appointment with us, prior to your surgery, and ask for it by name. In addition, you are responsible for: calling our office [phone], at any time of the day or night, and leaving a message stating your name, name of your surgeon, type of surgery, and date of procedure or surgery. Failure to comply with your responsibilities may result in termination of therapy involving the controlled substances. Medication Agreement Violation. Following the above rules, including your responsibilities will help you in avoiding a Medication Agreement Violation ("Breaking your Pain Medication Contract").  Consequences:  Not following the above rules may result in permanent discontinuation of medication prescription therapy.  *Opioid medications include: morphine, codeine, oxycodone, oxymorphone, hydrocodone, hydromorphone, meperidine, tramadol, tapentadol, buprenorphine, fentanyl, methadone. **Benzodiazepine medications include: diazepam (Valium), alprazolam (Xanax), clonazepam (Klonopine),  lorazepam (Ativan), clorazepate (Tranxene), chlordiazepoxide (Librium), estazolam (Prosom), oxazepam (Serax), temazepam (Restoril), triazolam (Halcion) (Last updated: 05/14/2022) ______________________________________________________________________    ______________________________________________________________________  Medication Recommendations and Reminders  Applies to: All patients receiving prescriptions (written and/or electronic).  Medication Rules & Regulations: You are responsible   for reading, knowing, and following our "Medication Rules" document. These exist for your safety and that of others. They are not flexible and neither are we. Dismissing or ignoring them is an act of "non-compliance" that may result in complete and irreversible termination of such medication therapy. For safety reasons, "non-compliance" will not be tolerated. As with the [place] fundamental legal principle of "ignorance of the law is no defense", we will accept no excuses for not having read and knowing the content of documents provided to you by our practice.  Pharmacy of record:  Definition: This is the pharmacy where your electronic prescriptions will be sent.  We do not endorse any particular pharmacy. It is up to you and your insurance to decide what pharmacy to use.  We do not restrict you in your choice of pharmacy. However, once we write for your prescriptions, we will NOT be re-sending more prescriptions to fix restricted supply problems created by your pharmacy, or your insurance.  The pharmacy listed in the electronic medical record should be the one where you want electronic prescriptions to be sent. If you choose to change pharmacy, simply notify our nursing staff. Changes will be made only during your regular appointments and not over the phone.  Recommendations: Keep all of your pain medications in a safe place, under lock and key, even if you live alone. We will NOT replace lost, stolen, or  damaged medication. We do not accept "Police Reports" as proof of medications having been stolen. After you fill your prescription, take 1 week's worth of pills and put them away in a safe place. You should keep a separate, properly labeled bottle for this purpose. The remainder should be kept in the original bottle. Use this as your primary supply, until it runs out. Once it's gone, then you know that you have 1 week's worth of medicine, and it is time to come in for a prescription refill. If you do this correctly, it is unlikely that you will ever run out of medicine. To make sure that the above recommendation works, it is very important that you make sure your medication refill appointments are scheduled at least 1 week before you run out of medicine. To do this in an effective manner, make sure that you do not leave the office without scheduling your next medication management appointment. Always ask the nursing staff to show you in your prescription , when your medication will be running out. Then arrange for the receptionist to get you a return appointment, at least 7 days before you run out of medicine. Do not wait until you have 1 or 2 pills left, to come in. This is very poor planning and does not take into consideration that we may need to cancel appointments due to bad weather, sickness, or emergencies affecting our staff. DO NOT ACCEPT A "Partial Fill": If for any reason your pharmacy does not have enough pills/tablets to completely fill or refill your prescription, do not allow for a "partial fill". The law allows the pharmacy to complete that prescription within 72 hours, without requiring a new prescription. If they do not fill the rest of your prescription within those 72 hours, you will need a separate prescription to fill the remaining amount, which we will NOT provide. If the reason for the partial fill is your insurance, you will need to talk to the pharmacist about payment alternatives for  the remaining tablets, but again, DO NOT ACCEPT A PARTIAL FILL, unless you can trust   your pharmacist to obtain the remainder of the pills within 72 hours.  Prescription refills and/or changes in medication(s):  Prescription refills, and/or changes in dose or medication, will be conducted only during scheduled medication management appointments. (Applies to both, written and electronic prescriptions.) No refills on procedure days. No medication will be changed or started on procedure days. No changes, adjustments, and/or refills will be conducted on a procedure day. Doing so will interfere with the diagnostic portion of the procedure. No phone refills. No medications will be "called into the pharmacy". No Fax refills. No weekend refills. No Holliday refills. No after hours refills.  Remember:  Business hours are:  Monday to Thursday 8:00 AM to 4:00 PM Provider's Schedule: Martez Weiand, MD - Appointments are:  Medication management: Monday and Wednesday 8:00 AM to 4:00 PM Procedure day: Tuesday and Thursday 7:30 AM to 4:00 PM Bilal Lateef, MD - Appointments are:  Medication management: Tuesday and Thursday 8:00 AM to 4:00 PM Procedure day: Monday and Wednesday 7:30 AM to 4:00 PM (Last update: 05/14/2022) ______________________________________________________________________    ____________________________________________________________________________________________  Pharmacy Shortages of Pain Medication   Introduction Shockingly as it may seem, .  "No U.S[place] Supreme Court decision has ever interpreted the Constitution as guaranteeing a right to health care for all Americans." - https://www.healthequityandpolicylab.com/elusive-right-to-health-care-under-us-law  "With respect to human rights, the [place] has no formally codified right to health, nor does it participate in a human rights treaty that specifies a right to health." - Scott J. Schweikart, JD,  MBE  Situation By now, most of our patients have had the experience of being told by their pharmacist that they do not have enough medication to cover their prescription. If you have not had this experience, just know that you soon will.  Problem There appears to be a shortage of these medications, either at the national level or locally. This is happening with all pharmacies. When there is not enough medication, patients are offered a partial fill and they are told that they will try to get the rest of the medicine for them at a later time. If they do not have enough for even a partial fill, the pharmacists are telling the patients to call us (the prescribing physicians) to request that we send another prescription to another pharmacy to get the medicine.   This reordering of a controlled substance creates documentation problems where additional paperwork needs to be created to explain why two prescriptions for the same period of time and the same medicine are being prescribed to the same patient. It also creates situations where the last appointment note does not accurately reflect when and what prescriptions were given to a patient. This leads to prescribing errors down the line, in subsequent follow-up visits.   [place] Board of Pharmacy ([place]BOP) Research revealed that Board of Pharmacy Rule .1806 (21 NCAC 46.1806) authorizes pharmacists to the transfer of prescriptions among pharmacies, and it sets forth procedural and recordkeeping requirements for doing so. However, this requires the pharmacist to complete the previously mentioned procedural paperwork to accomplish the transfer. As it turns out, it is much easier for them to have the prescribing physicians do the work.   Possible solutions 1. You can ask your physician to assist you in weaning yourself off these medications. 2. Ask your pharmacy if the medication is in stock, 3 days prior to your refill. 3. If you need a pharmacy change,  let us know at your medication management visit. Prescriptions that have already been   electronically sent to a pharmacy will not be re-sent to a different pharmacy if your pharmacy of record does not have it in stock. Proper stocking of medication is a pharmacy problem, not a prescriber problem. Work with your pharmacist to solve the problem. 4. Have the [place] Assembly add a provision to the "STOP ACT" (the law that mandates how controlled substances are prescribed) where there is an exception to the electronic prescribing rule that states that in the event there are shortages of medications the physicians are allowed to use written prescriptions as opposed to electronic ones. This would allow patients to take their prescriptions to a different pharmacy that may have enough medication available to fill the prescription. The problem is that currently there is a law that does not allow for written prescriptions, with the exception of instances where the electronic medical record is down due to technical issues.  5. Have [place] Congress ease the pressure on pharmaceutical companies, allowing them to produce enough quantities of the medication to adequately supply the population. 6. Have pharmacies keep enough stocks of these medications to cover their client base.  7. Have the [place] State Assembly add a provision to the "STOP ACT" where they ease the regulations surrounding the transfer of controlled substances between pharmacies, so as to simplify the transfer of supplies. As an alternative, develop a system to allow patients to obtain the remainder of their prescription at another one of their pharmacies or at an associate pharmacy.   How this shortage will affect you.  Understand that this is a pharmacy supply problem, not a prescriber problem. Work with your pharmacy to solve it. The job of the prescriber is to evaluate and monitor the patient for the appropriate indications and use of  these medicines. It is not the job of the prescriber to supply the medication or to solve problems with that supply. The responsibility and the choice to obtain the medication resides on the patient. By law, supplying the medication is the job of the pharmacy. It is certainly not the job of the prescriber to solve supply problems.   Due to the above problems we are no longer taking patients to write for their pain medication. Future discussions with your physician may include potentially weaning medications or transitioning to alternatives.  We will be focusing primarily on interventional based pain management. We will continue to evaluate for appropriate indications and we may provide recommendations regarding medication, dose, and schedule, as well as monitoring recommendations, however, we will not be taking over the actual prescribing of these substances. On those patients where we are treating their chronic pain with interventional therapies, exceptions will be considered on a case by case basis. At this time, we will try to continue providing this supplemental service to those patients we have been managing in the past. However, as of August 1st, 2023, we no longer will be sending additional prescriptions to other pharmacies for the purpose of solving their supply problems. Once we send a prescription to a pharmacy, we will not be resending it again to another pharmacy to cover for their shortages.   What to do. Write as many letters as you can. Recruit the help of family members in writing these letters. Below are some of the places where you can write to make your voice heard. Let them know what the problem is and push them to look for solutions.   Search internet for: "[place] find your legislators" https://www.ncleg.gov/findyourlegislators  Search   internet for: "[employer] commissioner  complaints" Starlas.fi  Search internet for: "[place] complaints" https://www.hernandez-brewer.com/.htm  Search internet for: "[facility] pharmacy complaints" Email [facility] Customer Relations woondaal.com.jsp?callType=store  Search internet for: Programme researcher, broadcasting/film/video customer service complaints" https://www.[facility].com/topic/marketing/contactus/contactus_customerservice.jsp  ____________________________________________________________________________________________     ____________________________________________________________________________________________  Drug Holidays (Slow)  What is a "Drug Holiday"? Drug Holiday: is the name given to the period of time during which a patient stops taking a medication(s) for the purpose of eliminating tolerance to the drug.  Benefits Improved effectiveness of opioids. Decreased opioid dose needed to achieve benefits. Improved pain with lesser dose.  What is tolerance? Tolerance: is the progressive decreased in effectiveness of a drug due to its repetitive use. With repetitive use, the body gets use to the medication and as a consequence, it loses its effectiveness. This is a common problem seen with opioid pain medications. As a result, a larger dose of the drug is needed to achieve the same effect that used to be obtained with a smaller dose.  How long should a "Drug Holiday" last? You should stay off of the pain medicine for at least 14 consecutive days. (2 weeks)  Should I stop the medicine "cold [place]"? No. You should always coordinate with your Pain Specialist so that he/she can provide you with the correct medication dose to make the transition as smoothly as possible.  How do I stop the medicine? Slowly. You will be instructed to decrease the daily amount of pills that you take by one (1) pill every seven (7) days.  This is called a "slow downward taper" of your dose. For example: if you normally take four (4) pills per day, you will be asked to drop this dose to three (3) pills per day for seven (7) days, then to two (2) pills per day for seven (7) days, then to one (1) per day for seven (7) days, and at the end of those last seven (7) days, this is when the "Drug Holiday" would start.   Will I have withdrawals? By doing a "slow downward taper" like this one, it is unlikely that you will experience any significant withdrawal symptoms. Typically, what triggers withdrawals is the sudden stop of a high dose opioid therapy. Withdrawals can usually be avoided by slowly decreasing the dose over a prolonged period of time. If you do not follow these instructions and decide to stop your medication abruptly, withdrawals may be possible.  What are withdrawals? Withdrawals: refers to the wide range of symptoms that occur after stopping or dramatically reducing opiate drugs after heavy and prolonged use. Withdrawal symptoms do not occur to patients that use low dose opioids, or those who take the medication sporadically. Contrary to benzodiazepine (example: Valium, Xanax, etc.) or alcohol withdrawals ("Delirium Tremens"), opioid withdrawals are not lethal. Withdrawals are the physical manifestation of the body getting rid of the excess receptors.  Expected Symptoms Early symptoms of withdrawal may include: Agitation Anxiety Muscle aches Increased tearing Insomnia Runny nose Sweating Yawning  Late symptoms of withdrawal may include: Abdominal cramping Diarrhea Dilated pupils Goose bumps Nausea Vomiting  Will I experience withdrawals? Due to the slow nature of the taper, it is very unlikely that you will experience any.  What is a slow taper? Taper: refers to the gradual decrease in dose.  (Last update:  02/09/2020) ____________________________________________________________________________________________    ____________________________________________________________________________________________  CBD (cannabidiol) & Delta-8 (Delta-8 tetrahydrocannabinol) WARNING  Intro: Cannabidiol (CBD) and tetrahydrocannabinol (THC), are two natural compounds found in  plants of the Cannabis genus. They can both be extracted from hemp or cannabis. Hemp and cannabis come from the Cannabis sativa plant. Both compounds interact with your body's endocannabinoid system, but they have very different effects. CBD does not produce the high sensation associated with cannabis. Delta-8 tetrahydrocannabinol, also known as delta-8 THC, is a psychoactive substance found in the Cannabis sativa plant, of which marijuana and hemp are two varieties. THC is responsible for the high associated with the illicit use of marijuana.  Applicable to: All individuals currently taking or considering taking CBD (cannabidiol) and, more important, all patients taking opioid analgesic controlled substances (pain medication). (Example: oxycodone; oxymorphone; hydrocodone; hydromorphone; morphine; methadone; tramadol; tapentadol; fentanyl; buprenorphine; butorphanol; dextromethorphan; meperidine; codeine; etc.)  Legal status: CBD remains a Schedule I drug prohibited for any use. CBD is illegal with one exception. In the [place], CBD has a limited Transport planner (FDA) approval for the treatment of two specific types of epilepsy disorders. Only one CBD product has been approved by the FDA for this purpose: "Epidiolex". FDA is aware that some companies are marketing products containing cannabis and cannabis-derived compounds in ways that violate the [employer], Drug and Cosmetic Act [facility] Act) and that may put the health and safety of consumers at risk. The FDA, a Federal agency, has not enforced the CBD status since 2018.  UPDATE: (09/07/2021) The Drug Enforcement Agency ([place]) issued a letter stating that "delta" cannabinoids, including Delta-8-THCO and Delta-9-THCO, synthetically derived from hemp do not qualify as hemp and will be viewed as Schedule I drugs. (Schedule I drugs, substances, or chemicals are defined as drugs with no currently accepted medical use and a high potential for abuse. Some examples of Schedule I drugs are: heroin, lysergic acid diethylamide (LSD), marijuana (cannabis), 3,4-methylenedioxymethamphetamine (ecstasy), methaqualone, and peyote.) (https://jennings.com/)  Legality: Some manufacturers ship CBD products nationally, which is illegal. Often such products are sold online and are therefore available throughout the country. CBD is openly sold in head shops and health food stores in some states where such sales have not been explicitly legalized. Selling unapproved products with unsubstantiated therapeutic claims is not only a violation of the law, but also can put patients at risk, as these products have not been proven to be safe or effective. Federal illegality makes it difficult to conduct research on CBD.  Reference: "FDA Regulation of Cannabis and Cannabis-Derived Products, Including Cannabidiol (CBD)" - SeekArtists.[employer].pt  Warning: CBD is not FDA approved and has not undergo the same manufacturing controls as prescription drugs.  This means that the purity and safety of available CBD may be questionable. Most of the time, despite manufacturer's claims, it is contaminated with THC (delta-9-tetrahydrocannabinol - the chemical in marijuana responsible for the "HIGH").  When this is the case, the [facility] contaminant will trigger a positive urine drug screen (UDS) test for Marijuana (carboxy-THC). Because a positive UDS for any illicit substance is a violation of our medication agreement, your  opioid analgesics (pain medicine) may be permanently discontinued. The FDA recently put out a warning about 5 things that everyone should be aware of regarding Delta-8 THC: Delta-8 THC products have not been evaluated or approved by the FDA for safe use and may be marketed in ways that put the public health at risk. The FDA has received adverse event reports involving delta-8 THC-containing products. Delta-8 THC has psychoactive and intoxicating effects. Delta-8 THC manufacturing often involve use of potentially harmful chemicals to create the concentrations of delta-8 THC claimed in  the marketplace. The final delta-8 THC product may have potentially harmful by-products (contaminants) due to the chemicals used in the process. Manufacturing of delta-8 THC products may occur in uncontrolled or unsanitary settings, which may lead to the presence of unsafe contaminants or other potentially harmful substances. Delta-8 THC products should be kept out of the reach of children and pets.  MORE ABOUT CBD  General Information: CBD was discovered in [birth year] and it is a derivative of the cannabis sativa genus plants (Marijuana and Hemp). It is one of the 113 identified substances found in Marijuana. It accounts for up to 40% of the plant's extract. As of 2018, preliminary clinical studies on CBD included research for the treatment of anxiety, movement disorders, and pain. CBD is available and consumed in multiple forms, including inhalation of smoke or vapor, as an aerosol spray, and by mouth. It may be supplied as an oil containing CBD, capsules, dried cannabis, or as a liquid solution. CBD is thought not to be as psychoactive as THC (delta-9-tetrahydrocannabinol - the chemical in marijuana responsible for the "HIGH"). Studies suggest that CBD may interact with different biological target receptors in the body, including cannabinoid and other neurotransmitter receptors. As of 2018 the mechanism of action for its  biological effects has not been determined.  Side-effects  Adverse reactions: Dry mouth, diarrhea, decreased appetite, fatigue, drowsiness, malaise, weakness, sleep disturbances, and others.  Drug interactions: CBC may interact with other medications such as blood-thinners. Because CBD causes drowsiness on its own, it also increases the drowsiness caused by other medications, including antihistamines (such as Benadryl), benzodiazepines (Xanax, Ativan, Valium), antipsychotics, antidepressants and opioids, as well as alcohol and supplements such as kava, melatonin and St. John's Wort. Be cautious with the following combinations:   Brivaracetam (Briviact) Brivaracetam is changed and broken down by the body. CBD might decrease how quickly the body breaks down brivaracetam. This might increase levels of brivaracetam in the body.  Caffeine Caffeine is changed and broken down by the body. CBD might decrease how quickly the body breaks down caffeine. This might increase levels of caffeine in the body.  Carbamazepine (Tegretol) Carbamazepine is changed and broken down by the body. CBD might decrease how quickly the body breaks down carbamazepine. This might increase levels of carbamazepine in the body and increase its side effects.  Citalopram (Celexa) Citalopram is changed and broken down by the body. CBD might decrease how quickly the body breaks down citalopram. This might increase levels of citalopram in the body and increase its side effects.  Clobazam (Onfi) Clobazam is changed and broken down by the liver. CBD might decrease how quickly the liver breaks down clobazam. This might increase the effects and side effects of clobazam.  Eslicarbazepine (Aptiom) Eslicarbazepine is changed and broken down by the body. CBD might decrease how quickly the body breaks down eslicarbazepine. This might increase levels of eslicarbazepine in the body by a small amount.  Everolimus (Zostress) Everolimus is  changed and broken down by the body. CBD might decrease how quickly the body breaks down everolimus. This might increase levels of everolimus in the body.  Lithium Taking higher doses of CBD might increase levels of lithium. This can increase the risk of lithium toxicity.  Medications changed by the liver (Cytochrome P450 1A1 (CYP1A1) substrates) Some medications are changed and broken down by the liver. CBD might change how quickly the liver breaks down these medications. This could change the effects and side effects of these medications.  Medications changed by  the liver (Cytochrome P450 1A2 (CYP1A2) substrates) Some medications are changed and broken down by the liver. CBD might change how quickly the liver breaks down these medications. This could change the effects and side effects of these medications.  Medications changed by the liver (Cytochrome P450 1B1 (CYP1B1) substrates) Some medications are changed and broken down by the liver. CBD might change how quickly the liver breaks down these medications. This could change the effects and side effects of these medications.  Medications changed by the liver (Cytochrome P450 2A6 (CYP2A6) substrates) Some medications are changed and broken down by the liver. CBD might change how quickly the liver breaks down these medications. This could change the effects and side effects of these medications.  Medications changed by the liver (Cytochrome P450 2B6 (CYP2B6) substrates) Some medications are changed and broken down by the liver. CBD might change how quickly the liver breaks down these medications. This could change the effects and side effects of these medications.  Medications changed by the liver (Cytochrome P450 2C19 (CYP2C19) substrates) Some medications are changed and broken down by the liver. CBD might change how quickly the liver breaks down these medications. This could change the effects and side effects of these  medications.  Medications changed by the liver (Cytochrome P450 2C8 (CYP2C8) substrates) Some medications are changed and broken down by the liver. CBD might change how quickly the liver breaks down these medications. This could change the effects and side effects of these medications.  Medications changed by the liver (Cytochrome P450 2C9 (CYP2C9) substrates) Some medications are changed and broken down by the liver. CBD might change how quickly the liver breaks down these medications. This could change the effects and side effects of these medications.  Medications changed by the liver (Cytochrome P450 2D6 (CYP2D6) substrates) Some medications are changed and broken down by the liver. CBD might change how quickly the liver breaks down these medications. This could change the effects and side effects of these medications.  Medications changed by the liver (Cytochrome P450 2E1 (CYP2E1) substrates) Some medications are changed and broken down by the liver. CBD might change how quickly the liver breaks down these medications. This could change the effects and side effects of these medications.  Medications changed by the liver (Cytochrome P450 3A4 (CYP3A4) substrates) Some medications are changed and broken down by the liver. CBD might change how quickly the liver breaks down these medications. This could change the effects and side effects of these medications.  Medications changed by the liver (Glucuronidated drugs) Some medications are changed and broken down by the liver. CBD might change how quickly the liver breaks down these medications. This could change the effects and side effects of these medications.  Medications that decrease the breakdown of other medications by the liver (Cytochrome P450 2C19 (CYP2C19) inhibitors) CBD is changed and broken down by the liver. Some drugs decrease how quickly the liver changes and breaks down CBD. This could change the effects and side effects of  CBD.  Medications that decrease the breakdown of other medications in the liver (Cytochrome P450 3A4 (CYP3A4) inhibitors) CBD is changed and broken down by the liver. Some drugs decrease how quickly the liver changes and breaks down CBD. This could change the effects and side effects of CBD.  Medications that increase breakdown of other medications by the liver (Cytochrome P450 3A4 (CYP3A4) inducers) CBD is changed and broken down by the liver. Some drugs increase how quickly the liver changes and breaks down  CBD. This could change the effects and side effects of CBD.  Medications that increase the breakdown of other medications by the liver (Cytochrome P450 2C19 (CYP2C19) inducers) CBD is changed and broken down by the liver. Some drugs increase how quickly the liver changes and breaks down CBD. This could change the effects and side effects of CBD.  Methadone (Dolophine) Methadone is broken down by the liver. CBD might decrease how quickly the liver breaks down methadone. Taking cannabidiol along with methadone might increase the effects and side effects of methadone.  Rufinamide (Banzel) Rufinamide is changed and broken down by the body. CBD might decrease how quickly the body breaks down rufinamide. This might increase levels of rufinamide in the body by a small amount.  Sedative medications (CNS depressants) CBD might cause sleepiness and slowed breathing. Some medications, called sedatives, can also cause sleepiness and slowed breathing. Taking CBD with sedative medications might cause breathing problems and/or too much sleepiness.  Sirolimus (Rapamune) Sirolimus is changed and broken down by the body. CBD might decrease how quickly the body breaks down sirolimus. This might increase levels of sirolimus in the body.  Stiripentol (Diacomit) Stiripentol is changed and broken down by the body. CBD might decrease how quickly the body breaks down stiripentol. This might increase levels of  stiripentol in the body and increase its side effects.  Tacrolimus (Prograf) Tacrolimus is changed and broken down by the body. CBD might decrease how quickly the body breaks down tacrolimus. This might increase levels of tacrolimus in the body.  Tamoxifen (Soltamox) Tamoxifen is changed and broken down by the body. CBD might affect how quickly the body breaks down tamoxifen. This might affect levels of tamoxifen in the body.  Topiramate (Topamax) Topiramate is changed and broken down by the body. CBD might decrease how quickly the body breaks down topiramate. This might increase levels of topiramate in the body by a small amount.  Valproate Valproic acid can cause liver injury. Taking cannabidiol with valproic acid might increase the chance of liver injury. CBD and/or valproic acid might need to be stopped, or the dose might need to be reduced.  Warfarin (Coumadin) CBD might increase levels of warfarin, which can increase the risk for bleeding. CBD and/or warfarin might need to be stopped, or the dose might need to be reduced.  Zonisamide Zonisamide is changed and broken down by the body. CBD might decrease how quickly the body breaks down zonisamide. This might increase levels of zonisamide in the body by a small amount. (Last update: 09/19/2021) ____________________________________________________________________________________________   ____________________________________________________________________________________________  Naloxone Nasal Spray  Why am I receiving this medication? [place] STOP ACT requires that all patients taking high dose opioids or at risk of opioids respiratory depression, be prescribed an opioid reversal agent, such as Naloxone (AKA: Narcan).  What is this medication? NALOXONE (nal OX one) treats opioid overdose, which causes slow or shallow breathing, severe drowsiness, or trouble staying awake. Call emergency services after using this medication.  You may need additional treatment. Naloxone works by reversing the effects of opioids. It belongs to a group of medications called opioid blockers.  COMMON BRAND NAME(S): Kloxxado, Narcan  What should I tell my care team before I take this medication? They need to know if you have any of these conditions: Heart disease Substance use disorder An unusual or allergic reaction to naloxone, other medications, foods, dyes, or preservatives Pregnant or trying to get pregnant Breast-feeding  When to use this medication? This medication is to  be used for the treatment of respiratory depression (less than 8 breaths per minute) secondary to opioid overdose.   How to use this medication? This medication is for use in the nose. Lay the person on their back. Support their neck with your hand and allow the head to tilt back before giving the medication. The nasal spray should be given into 1 nostril. After giving the medication, move the person onto their side. Do not remove or test the nasal spray until ready to use. Get emergency medical help right away after giving the first dose of this medication, even if the person wakes up. You should be familiar with how to recognize the signs and symptoms of a narcotic overdose. If more doses are needed, give the additional dose in the other nostril. Talk to your care team about the use of this medication in children. While this medication may be prescribed for children as young as newborns for selected conditions, precautions do apply.  Naloxone Overdosage: If you think you have taken too much of this medicine contact a poison control center or emergency room at once.  NOTE: This medicine is only for you. Do not share this medicine with others.  What if I miss a dose? This does not apply.  What may interact with this medication? This is only used during an emergency. No interactions are expected during emergency use. This list may not describe all possible  interactions. Give your health care provider a list of all the medicines, herbs, non-prescription drugs, or dietary supplements you use. Also tell them if you smoke, drink alcohol, or use illegal drugs. Some items may interact with your medicine.  What should I watch for while using this medication? Keep this medication ready for use in the case of an opioid overdose. Make sure that you have the phone number of your care team and local hospital ready. You may need to have additional doses of this medication. Each nasal spray contains a single dose. Some emergencies may require additional doses. After use, bring the treated person to the nearest hospital or call 911. Make sure the treating care team knows that the person has received a dose of this medication. You will receive additional instructions on what to do during and after use of this medication before an emergency occurs.  What side effects may I notice from receiving this medication? Side effects that you should report to your care team as soon as possible: Allergic reactions--skin rash, itching, hives, swelling of the face, lips, tongue, or throat Side effects that usually do not require medical attention (report these to your care team if they continue or are bothersome): Constipation Dryness or irritation inside the nose Headache Increase in blood pressure Muscle spasms Stuffy nose Toothache This list may not describe all possible side effects. Call your doctor for medical advice about side effects. You may report side effects to FDA at 1-800-FDA-1088.  Where should I keep my medication? Because this is an emergency medication, you should keep it with you at all times.  Keep out of the reach of children and pets. Store between 20 and 25 degrees C (68 and 77 degrees F). Do not freeze. Throw away any unused medication after the expiration date. Keep in original box until ready to use.  NOTE: This sheet is a summary. It may not cover  all possible information. If you have questions about this medicine, talk to your doctor, pharmacist, or health care provider.   2023 Elsevier/Gold Standard (  2021-03-16 00:00:00)  ____________________________________________________________________________________________   

## 2022-06-17 ENCOUNTER — Encounter: Payer: Self-pay | Admitting: Pain Medicine

## 2022-06-17 ENCOUNTER — Ambulatory Visit: Payer: Medicare (Managed Care) | Attending: Pain Medicine | Admitting: Pain Medicine

## 2022-06-17 VITALS — BP 156/92 | HR 79 | Temp 98.0°F | Resp 16 | Ht 75.0 in | Wt 210.0 lb

## 2022-06-17 DIAGNOSIS — Z79891 Long term (current) use of opiate analgesic: Secondary | ICD-10-CM | POA: Insufficient documentation

## 2022-06-17 DIAGNOSIS — G894 Chronic pain syndrome: Secondary | ICD-10-CM | POA: Diagnosis not present

## 2022-06-17 DIAGNOSIS — M5441 Lumbago with sciatica, right side: Secondary | ICD-10-CM | POA: Diagnosis present

## 2022-06-17 DIAGNOSIS — M79671 Pain in right foot: Secondary | ICD-10-CM | POA: Diagnosis not present

## 2022-06-17 DIAGNOSIS — Z79899 Other long term (current) drug therapy: Secondary | ICD-10-CM | POA: Insufficient documentation

## 2022-06-17 DIAGNOSIS — M25561 Pain in right knee: Secondary | ICD-10-CM | POA: Diagnosis present

## 2022-06-17 DIAGNOSIS — M25562 Pain in left knee: Secondary | ICD-10-CM | POA: Diagnosis present

## 2022-06-17 DIAGNOSIS — M79605 Pain in left leg: Secondary | ICD-10-CM | POA: Diagnosis present

## 2022-06-17 DIAGNOSIS — G8929 Other chronic pain: Secondary | ICD-10-CM | POA: Insufficient documentation

## 2022-06-17 DIAGNOSIS — M5442 Lumbago with sciatica, left side: Secondary | ICD-10-CM | POA: Diagnosis not present

## 2022-06-17 DIAGNOSIS — M79672 Pain in left foot: Secondary | ICD-10-CM | POA: Diagnosis present

## 2022-06-17 DIAGNOSIS — M79604 Pain in right leg: Secondary | ICD-10-CM | POA: Insufficient documentation

## 2022-06-17 DIAGNOSIS — M961 Postlaminectomy syndrome, not elsewhere classified: Secondary | ICD-10-CM | POA: Insufficient documentation

## 2022-06-17 MED ORDER — HYDROMORPHONE HCL 4 MG PO TABS: 4.0000 mg | ORAL_TABLET | Freq: Three times a day (TID) | ORAL | 0 refills | Status: DC | PRN

## 2022-06-17 MED ORDER — NALOXONE HCL 4 MG/0.1ML NA LIQD: 1.0000 | NASAL | 0 refills | Status: DC | PRN

## 2022-06-17 NOTE — Progress Notes (Signed)
Nursing Pain Medication Assessment:  Safety precautions to be maintained throughout the outpatient stay will include: orient to surroundings, keep bed in low position, maintain call bell within reach at all times, provide assistance with transfer out of bed and ambulation.  Medication Inspection Compliance: [name] did not comply with our request to bring his pills to be counted. He was reminded that bringing the medication bottles, even when empty, is a requirement.  Medication: None brought in. Pill/Patch Count: None available to be counted. Bottle Appearance: No container available. Did not bring bottle(s) to appointment. Filled Date: N/A Last Medication intake:  Yesterday

## 2022-06-17 NOTE — Progress Notes (Signed)
Pt was called at home to pick up his medication for Hydromorphone '4mg'$  today.

## 2022-07-03 ENCOUNTER — Encounter: Payer: Self-pay | Admitting: Pain Medicine

## 2022-07-03 ENCOUNTER — Ambulatory Visit: Payer: Medicare (Managed Care) | Attending: Pain Medicine | Admitting: Pain Medicine

## 2022-07-03 VITALS — BP 144/74 | HR 69 | Temp 97.2°F | Ht 75.0 in | Wt 210.0 lb

## 2022-07-03 DIAGNOSIS — M79672 Pain in left foot: Secondary | ICD-10-CM | POA: Diagnosis present

## 2022-07-03 DIAGNOSIS — M961 Postlaminectomy syndrome, not elsewhere classified: Secondary | ICD-10-CM | POA: Insufficient documentation

## 2022-07-03 DIAGNOSIS — M79604 Pain in right leg: Secondary | ICD-10-CM | POA: Insufficient documentation

## 2022-07-03 DIAGNOSIS — Z79891 Long term (current) use of opiate analgesic: Secondary | ICD-10-CM | POA: Insufficient documentation

## 2022-07-03 DIAGNOSIS — Z79899 Other long term (current) drug therapy: Secondary | ICD-10-CM | POA: Insufficient documentation

## 2022-07-03 DIAGNOSIS — M25561 Pain in right knee: Secondary | ICD-10-CM | POA: Insufficient documentation

## 2022-07-03 DIAGNOSIS — G8929 Other chronic pain: Secondary | ICD-10-CM | POA: Insufficient documentation

## 2022-07-03 DIAGNOSIS — M5441 Lumbago with sciatica, right side: Secondary | ICD-10-CM | POA: Insufficient documentation

## 2022-07-03 DIAGNOSIS — M79671 Pain in right foot: Secondary | ICD-10-CM | POA: Diagnosis not present

## 2022-07-03 DIAGNOSIS — M25562 Pain in left knee: Secondary | ICD-10-CM | POA: Diagnosis present

## 2022-07-03 DIAGNOSIS — G894 Chronic pain syndrome: Secondary | ICD-10-CM | POA: Insufficient documentation

## 2022-07-03 DIAGNOSIS — M5442 Lumbago with sciatica, left side: Secondary | ICD-10-CM | POA: Diagnosis not present

## 2022-07-03 DIAGNOSIS — M79605 Pain in left leg: Secondary | ICD-10-CM | POA: Insufficient documentation

## 2022-07-03 MED ORDER — HYDROMORPHONE HCL 4 MG PO TABS: 4.0000 mg | ORAL_TABLET | Freq: Three times a day (TID) | ORAL | 0 refills | Status: DC | PRN

## 2022-07-03 MED ORDER — HYDROMORPHONE HCL 4 MG PO TABS: 4.0000 mg | ORAL_TABLET | Freq: Three times a day (TID) | ORAL | 0 refills | Status: DC

## 2022-07-03 NOTE — Progress Notes (Signed)
PROVIDER NOTE: Information contained herein reflects review and annotations entered in association with encounter. Interpretation of such information and data should be left to medically-trained personnel. Information provided to patient can be located elsewhere in the medical record under "Patient Instructions". Document created using STT-dictation technology, any transcriptional errors that may result from process are unintentional.    Patient: [name]  Service Category: E/M  Provider: [name], MD  DOB: 1945-12-18  DOS: 07/03/2022  Referring Provider: Adin Hector, MD  MRN: [id number]  Specialty: Interventional Pain Management  PCP: Adin Hector, MD  Type: Established Patient  Setting: Ambulatory outpatient    Location: Office  Delivery: Face-to-face     HPI  [name], a 76 y.o. year old male, is here today because of his Chronic pain syndrome [G89.4]. [name] primary complain today is Back Pain (lower) Last encounter: My last encounter with him was on 06/17/2022. Pertinent problems: 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[place]); Chronic ankle pain (Bilateral); Pain in joint, ankle and foot; Polyneuropathy associated with underlying disease ([place]); Presence of intrathecal pump; Ankle sprain; Rotator cuff sprain; Lumbosacral radiculopathy at S1 (Bilateral) (R>L); Chronic feet pain (3ry area of Pain) (Bilateral) (R>L); Chronic knee pain (4th area of Pain) (Bilateral) (R>L); Failed back surgical syndrome; Lower extremity weakness (Bilateral); Chronic neuropathic pain; Peripheral neurogenic pain; Nonfamilial nocturnal leg cramps (Bilateral); Restless leg syndrome; Pain in right knee; DDD (degenerative  disc disease), lumbosacral; Abnormal MRI, lumbar spine (02/12/2022); 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; Lumbar facet arthropathy (Multilevel) (Bilateral); Lumbosacral facet syndrome (Bilateral); Lumbar foraminal narrowing (Multilevel) (Bilateral); Cervicalgia; Chronic shoulder pain (Bilateral) (R>L); Radicular pain of shoulder; Occipital neuralgia (Right); Cervico-occipital neuralgia (Right); Cervical facet hypertrophy (Multilevel) (Bilateral); Cervical facet pain; Painful cervical range of motion; Chronic neurogenic pain; Left rib fracture; Chronic pain; Foot pain; Lumbago; 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 Pain Assessment: Severity of Chronic pain is reported as a 8 /10. Location: Back Right, Left, Lower/radiates to bottom of feet. Onset: More than a month ago. Quality: Constant. Timing: Constant. Modifying factor(s): rest, meds. Vitals:  height is _0  (1.905 m) and weight is 210 lb (95.3 kg). His temporal temperature is 97.2 F (36.2 C) (abnormal). His blood pressure is 144/74 (abnormal) and his pulse is 69. His oxygen saturation is 98%.  BMI: Estimated body mass index is 26.25 kg/m as calculated from the following:   Height as of this encounter: _1  (1.905 m).   Weight as of this encounter: 210 lb (95.3 kg).  Reason for encounter: medication management.  The patient indicates doing well with the current medication regimen. No adverse reactions or side effects reported to the medications.   Today there was a mass associated to his prescriptions. According to my notes, on 03/20/2022 we sent 3 prescriptions to the [facility] in [address].  The last of these 3 prescriptions.  Apparently part of the problem is that my prescriptions were discontinued by Pricilla Handler, [place] on 04/24/2022, citing the reason to be "duplicate" prescriptions, which they were not.  Because these prescriptions were canceled,  the patient found himself without prescriptions to be filled.  On 06/17/2022, to compensate for this mass created by Pricilla Handler, [place] on 04/24/2022, I sent 2 prescriptions to his pharmacy.  1 for 27 pills to be filled on 06/17/2022 and to last until 07/03/2022.  A second prescription for 90 pills was sent on 06/17/2022 to be filled on 06/26/2022, the last until 07/26/2022.  The second prescription was never filled by the pharmacy.  The prescription for the 27 pills was filled on 06/17/2022 and for some reason the patient was apparently told that there were no other prescriptions, when in fact I had sent a second 1 for the 90 pills.  Because this messed our counts today and the first of the 3 prescriptions was sent to be filled on 07/26/2022, he now finds himself with no pills left.  Today, rather than changing these prescriptions, I have given him a fourth prescription that he can have filled today 07/03/2022, the last until 07/26/2022, when he should then resume his normal cycle.  Today we have called the pharmacy to make sure that they do cancel the second prescription that I wrote on 06/17/2022 for 90 pills, if they still have it available.  I am concerned that the patient might have called the pharmacy and asked for "refills" on his hydromorphone.  We have been having a lot of problems with pharmacist telling the patient is that there are no "refills", which is technically correct but they should also inform the patient that there are other "prescriptions".  RTCB: 10/24/2022  Nonopioids transferred (03/20/2022): Pregabalin (Lyrica) 75 mg TID.  Pharmacotherapy Assessment  Analgesic: Hydromorphone 4 mg tablet, 1 tab p.o. 3 times daily MME/day: 60 mg/day    Monitoring: [place] PMP: PDMP reviewed during this encounter.       Pharmacotherapy: No side-effects or adverse  reactions reported. Compliance: No problems identified. Effectiveness: Clinically acceptable.  Arlice Colt, RN  07/03/2022  8:05 AM  Sign when Signing Visit Nursing Pain Medication Assessment:  Safety precautions to be maintained throughout the outpatient stay will include: orient to surroundings, keep bed in low position, maintain call bell within reach at all times, provide assistance with transfer out of bed and ambulation.  Medication Inspection Compliance: Pill count conducted under aseptic conditions, in front of the patient. Neither the pills nor the bottle was removed from the patient's sight at any time. Once count was completed pills were immediately returned to the patient in their original bottle.  Medication: Hydromorphone (Dilaudid) Pill/Patch Count:  0 of 27 pills remain Pill/Patch Appearance: Markings consistent with prescribed medication Bottle Appearance: Standard pharmacy container. Clearly labeled. Filled Date: 15 / 27 / 2023 Last Medication intake:  YesterdaySafety precautions to be maintained throughout the outpatient stay will include: orient to surroundings, keep bed in low position, maintain call bell within reach at all times, provide assistance with transfer out of bed and ambulation.     No results found for: "CBDTHCR" No results found for: "D8THCCBX" No results found for: "D9THCCBX"  UDS:  Summary  Date Value Ref Range Status  09/19/2021 Note  Final    Comment:    ==================================================================== [employer] Select 13 (MW) ==================================================================== Test                             Result       Flag       Units  Drug Present and Declared for Prescription Verification  Hydromorphone                  1862         EXPECTED   ng/mg creat    Hydromorphone may be administered as a scheduled prescription    medication; it is also an expected metabolite of  hydrocodone.  ==================================================================== Test                      Result    Flag   Units      Ref Range   Creatinine              26               mg/dL      >=20 ==================================================================== Declared Medications:  The flagging and interpretation on this report are based on the  following declared medications.  Unexpected results may arise from  inaccuracies in the declared medications.   **Note: The testing scope of this panel includes these medications:   Hydromorphone (Dilaudid)   **Note: The testing scope of this panel does not include the  following reported medications:   Albuterol (Proair HFA)  Amitriptyline (Elavil)  Azelastine  Bisacodyl  Clonidine (Catapres)  Duloxetine (Cymbalta)  Eye Drop  Levothyroxine (Synthroid)  Losartan (Cozaar)  Multivitamin  Naloxone (Narcan)  Olodaterol  Pregabalin (Lyrica)  Promethazine (Phenergan)  Simvastatin (Zocor)  Tiotropium ==================================================================== For clinical consultation, please call [phone]. ====================================================================       ROS  Constitutional: Denies any fever or chills Gastrointestinal: No reported hemesis, hematochezia, vomiting, or acute GI distress Musculoskeletal: Denies any acute onset joint swelling, redness, loss of ROM, or weakness Neurological: No reported episodes of acute onset apraxia, aphasia, dysarthria, agnosia, amnesia, paralysis, loss of coordination, or loss of consciousness  Medication Review  DULoxetine, HYDROmorphone, albuterol, amitriptyline, apixaban, aspirin EC, azelastine, bisacodyl, cloNIDine, esomeprazole, latanoprost, levothyroxine, lidocaine, loratadine, losartan, metoprolol succinate, metoprolol tartrate, naloxone, pregabalin, promethazine, senna-docusate, simvastatin, spironolactone, and tiZANidine  History Review   Allergy: Mr. Seeberger is allergic to celebrex [celecoxib], codeine, and penicillins. Drug: Mr. Lembo  reports no history of drug use. Alcohol:  reports no history of alcohol use. Tobacco:  reports that he has been smoking cigarettes. He has a 44.25 pack-year smoking history. He has never used smokeless tobacco. Social: Mr. Martine  reports that he has been smoking cigarettes. He has a 44.25 pack-year smoking history. He has never used smokeless tobacco. He reports that he does not drink alcohol and does not use drugs. Medical:  has a past medical history of Acute anterolateral wall MI ([place]) (2000), Acute non-Q wave non-ST elevation myocardial infarction (NSTEMI) ([place]) (12/03/2004), Adenomatous polyp of colon, Agent orange exposure, Angina pectoris ([place]), Anxiety, Aortic atherosclerosis ([place]), Asthma, Bladder stones, Bradycardia, CHF (congestive heart failure) ([place]), Chronic lower back pain, Chronic pain syndrome, COPD (chronic obstructive pulmonary disease) ([place]), Coronary artery disease, Depression, DJD (degenerative joint disease), DVT of lower extremity, bilateral ([place]), Dyspnea, GERD (gastroesophageal reflux disease), History of hiatal hernia, HLD (hyperlipidemia), Hypertension, Hypothyroidism, Inflammatory polyarthropathy ([place]), Internal hemorrhoids, Long term prescription opiate use, 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 Surgical: Mr. Snellgrove  has a past surgical  history that includes Coronary angioplasty; Posterior lumbar fusion (N/A,  1999); Nissen fundoplication (N/A, 1655); Mastoidectomy; Partial colectomy (N/A); Intrathecal pump implantation (N/A, 03/2008); Intrathecal pump revision (N/A, 09/28/2014); Transurethral resection of bladder tumor (N/A, 03/07/2015); Colonoscopy with propofol (N/A, 02/27/2018); External ear surgery; Skin cancer excision; IVC FILTER PLACEMENT ([facility] HX) (N/A, 09/2007); IVC FILTER REMOVAL (N/A, 2011); Colonoscopy (N/A, 11/22/2013); Colonoscopy (N/A, 09/19/2008); Colonoscopy (N/A, 08/25/2003); Colonoscopy (N/A, 10/15/1994); Posterior laminectomy / decompression cervical spine (N/A, 1984); Posterior laminectomy / decompression cervical spine (N/A, 1985); Tonsillectomy (Bilateral, 1958); Coronary angioplasty with stent (Left, 12/04/2004); LEFT HEART CATH AND CORONARY ANGIOGRAPHY (Left, 02/19/2008); LEFT HEART CATH AND CORONARY ANGIOGRAPHY (Left, 04/18/1987); LEFT HEART CATH AND CORONARY ANGIOGRAPHY (Left, 02/03/2002); and Pain pump removal (N/A, 12/20/2021). Family: family history includes Emphysema in his father and sister; Heart attack in his father; Heart disease in his brother and mother.  Laboratory Chemistry Profile   Renal Lab Results  Component Value Date   BUN 10 01/30/2022   CREATININE 0.91 01/30/2022   BCR 11 01/30/2022   GFRAA >60 03/02/2015   GFRNONAA >60 11/01/2021    Hepatic Lab Results  Component Value Date   AST 31 01/30/2022   ALBUMIN 4.0 01/30/2022   ALKPHOS 96 01/30/2022    Electrolytes Lab Results  Component Value Date   NA 145 (H) 01/30/2022   K 5.2 01/30/2022   CL 104 01/30/2022   CALCIUM 9.6 01/30/2022   MG 2.3 11/01/2021   PHOS 3.7 11/01/2021    Bone Lab Results  Component Value Date   25OHVITD1 22 (L) 02/07/2021   25OHVITD2 <1.0 02/07/2021   25OHVITD3 22 02/07/2021    Inflammation (CRP: Acute Phase) (ESR: Chronic Phase) Lab Results  Component Value Date   CRP 5 01/30/2022   ESRSEDRATE 11 01/30/2022   LATICACIDVEN 0.9 10/28/2021         Note: Above Lab  results reviewed.  Recent Imaging Review  MR LUMBAR SPINE WO CONTRAST CLINICAL DATA:  Low back pain and bilateral leg pain for 3 weeks. History of surgery in 1999.  EXAM: MRI LUMBAR SPINE WITHOUT CONTRAST  TECHNIQUE: Multiplanar, multisequence MR imaging of the lumbar spine was performed. No intravenous contrast was administered.  COMPARISON:  CT abdomen/pelvis 01/25/2022, lumbar spine MRI 10/21/2005  FINDINGS: Segmentation: Standard; the lowest formed disc space is designated L5-S1  Alignment: There is grade 1 anterolisthesis of L4 on L5, unchanged since the recent prior CT abdomen/pelvis but new since 2007.  Vertebrae: Vertebral body heights are preserved. Background marrow signal is normal. There is no suspicious marrow signal abnormality or marrow edema.  A focus of T1 and T2 hyperintensity in the right iliac bone is unchanged since 2007, benign. A similar appearing lesion in the left iliac bone is also likely benign.  Conus medullaris and cauda equina: Conus extends to the L1 level. Conus and cauda equina appear normal.  Paraspinal and other soft tissues: Unremarkable.  Disc levels:  T12-L1: No significant spinal canal or neural foraminal stenosis  L1-L2: There is a shallow central protrusion and mild facet arthropathy without significant spinal canal or neural foraminal stenosis.  L2-L3: There is a disc bulge eccentric to the left and mild bilateral facet arthropathy resulting in narrowing of the left subarticular zone with potential mass effect on the traversing left L3 nerve root (8-17, 5-11), and no significant neural foraminal stenosis  L3-L4: There is a diffuse disc bulge with a prominent left foraminal/extraforaminal protrusion/extrusion and bilateral facet arthropathy with ligamentum flavum thickening resulting in moderate spinal canal  stenosis with bilateral subarticular zone narrowing, left worse than right, without evidence of frank impingement  and moderate left and mild right neural foraminal stenosis with contact and probable displacement of the exiting left L3 nerve root and possible contact of the exiting right L3 nerve root (5-14, 5-4).  L4-L5: There is grade 1 anterolisthesis with uncovering of the disc posteriorly. Suspect decompressive laminectomies at this level. There is a mild disc bulge and moderate facet arthropathy resulting in moderate spinal canal stenosis with left worse than right subarticular zone narrowing but no evidence of frank impingement and moderate left and mild right neural foraminal stenosis  L5-S1: Status post decompressive laminectomies. There is bilateral facet arthropathy without significant spinal canal or neural foraminal stenosis.  IMPRESSION: 1. L2-L3: Disc bulge eccentric to the left resulting in narrowing of the left subarticular zone with possible impingement of the traversing left L3 nerve root. 2. L3-L4: Disc bulge with a prominent left foraminal/extraforaminal protrusion and bilateral facet arthropathy resulting in moderate spinal canal stenosis with left worse than right subarticular zone narrowing and moderate left and mild right neural foraminal stenosis with contact and possible displacement of either or both exiting L3 nerve roots, left worse than right. 3. L4-L5: Suspect decompressive laminectomies. Grade 1 anterolisthesis and bulky facet arthropathy results in moderate spinal canal stenosis with left worse than right subarticular zone narrowing and moderate left neural foraminal stenosis. 4. L5-S1: Decompressive laminectomies with bilateral facet arthropathy no significant spinal canal or neural foraminal stenosis.  Electronically Signed   By: Valetta Mole M.D.   On: 02/12/2022 12:14 Note: Reviewed        Physical Exam  General appearance: Well nourished, well developed, and well hydrated. In no apparent acute distress Mental status: Alert, oriented x 3 (person,  place, & time)       Respiratory: No evidence of acute respiratory distress Eyes: PERLA Vitals: BP (!) 144/74 (BP Location: Left Arm, Patient Position: Sitting, Cuff Size: Normal)   Pulse 69   Temp (!) 97.2 F (36.2 C) (Temporal)   Ht _0  (1.905 m)   Wt 210 lb (95.3 kg)   SpO2 98%   BMI 26.25 kg/m  BMI: Estimated body mass index is 26.25 kg/m as calculated from the following:   Height as of this encounter: _1  (1.905 m).   Weight as of this encounter: 210 lb (95.3 kg). Ideal: Ideal body weight: 84.5 kg (186 lb 4.6 oz) Adjusted ideal body weight: 88.8 kg (195 lb 12.4 oz)  Assessment   Diagnosis Status  1. Chronic pain syndrome   2. 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 Chronic lower extremity pain (2ry area of Pain) (Bilateral) (R>L)   4. Chronic feet pain (3ry area of Pain) (Bilateral) (R>L)   5. Chronic knee pain (4th area of Pain) (Bilateral) (R>L)   6. Lumbar post-laminectomy syndrome   7. Failed back surgical syndrome   8. Pharmacologic therapy   9. Chronic use of opiate for therapeutic purpose   10. Encounter for medication management   11. Encounter for chronic pain management    Controlled Controlled Controlled   Updated Problems: Problem  Abnormal MRI, lumbar spine (02/12/2022)   (02/12/2022) LUMBAR MRI FINDINGS: Alignment: There is grade 1 anterolisthesis of L4 on L5 A focus of T1 and T2 hyperintensity in the right iliac bone is unchanged since 2007, benign. A similar appearing lesion in the left iliac bone is also likely benign.  DISC LEVELS: L1-L2: There is a shallow central protrusion and mild facet arthropathy without significant spinal canal or neural foraminal stenosis. L2-L3: There is a disc bulge eccentric to the left and mild bilateral facet arthropathy resulting in narrowing of the left subarticular zone with potential mass effect on the traversing left L3 nerve root (8-17, 5-11), and no  significant neural foraminal stenosis L3-L4: There is a diffuse disc bulge with a prominent left foraminal/extraforaminal protrusion/extrusion and bilateral facet arthropathy with ligamentum flavum thickening resulting in moderate spinal canal stenosis with bilateral subarticular zone narrowing, left worse than right, without evidence of frank impingement and moderate left and mild right neural foraminal stenosis with contact and probable displacement of the exiting left L3 nerve root and possible contact of the exiting right L3 nerve root (5-14, 5-4). L4-L5: There is grade 1 anterolisthesis with uncovering of the disc posteriorly. Suspect decompressive laminectomies at this level. There is a mild disc bulge and moderate facet arthropathy resulting in moderate spinal canal stenosis with left worse than right subarticular zone narrowing but no evidence of frank impingement and moderate left and mild right neural foraminal stenosis L5-S1: Status post decompressive laminectomies. There is bilateral facet arthropathy without significant spinal canal or neural foraminal stenosis.   IMPRESSION: 1. L2-L3: Disc bulge eccentric to the left resulting in narrowing of the left subarticular zone with possible impingement of the traversing left L3 nerve root. 2. L3-L4: Disc bulge with a prominent left foraminal/extraforaminal protrusion and bilateral facet arthropathy resulting in moderate spinal canal stenosis with left worse than right subarticular zone narrowing and moderate left and mild right neural foraminal stenosis with contact and possible displacement of either or both exiting L3 nerve roots, left worse than right. 3. L4-L5: Suspect decompressive laminectomies. Grade 1 anterolisthesis and bulky facet arthropathy results in moderate spinal canal stenosis with left worse than right subarticular zone narrowing and moderate left neural foraminal stenosis. 4. L5-S1: Decompressive  laminectomies with bilateral facet arthropathy no significant spinal canal or neural foraminal stenosis.    (11/01/2019) LUMBAR MRI FINDINGS:  1.6 cm lesion noted in the left proximal femur intertrochanteric region. This may  relate to focal sclerotic lesion but incompletely characterized on this  exam. Findings nonspecific but statistically benign in absence of known  malignancy. Dedicated plain films of the left femur can be performed for  further assessment if not already previously assessed.   Laminectomy defect at L5 noted. Nonspecific mild subcutaneous edema in the  subcutaneous fat of lower back region. Paraspinal muscle atrophy, greatest  at the lumbosacral junction, nonspecific.   Disc desiccation throughout the lumbar spine. Grade 1 anterolisthesis of L4  on L5, likely degenerative in nature. Mild degenerative disc space narrowing at L1-2 and L2-3. Mild T2 high signal posteriorly at L5-S1 disc  suggesting annular fissure.  Nonspecific 1 cm T2 high signal focus in right iliac bone (series 15 image  41), statistically benign in absence of known malignancy but incompletely  characterized on this exam.   8 mm T2 high signal lesion anteriorly in mid right kidney which is  incompletely characterized but statistically small cyst. Renal ultrasound  could be performed if further assessment warranted clinically.   9 mm perineural sleeve cyst suspected in the left T12-L1 neural foramen.   L1-2: Mild degenerative disc bulging. Mild facet joint degenerative changes. Mild to moderate bilateral neuroforaminal narrowing.  L2-3: Mild broad-based disc herniation more eccentric to the left into the left neuroforamen. Mild facet joint degenerative changes with probable small amount  of fluid in the facet joints. Moderate narrowing of left  neuroforamen. Mild-to-moderate narrowing of right neuroforamen. Mild central spinal canal stenosis.  L3-4: Mild broad-based disc herniation. Moderate facet joint  degenerative  changes. Small amount of fluid suspected in the facet joints. Moderate  spinal canal stenosis. Disc herniation appears to mildly abut the bilateral L3 nerve roots. Moderate bilateral neuroforaminal narrowing.  L4-5: Mild broad-based disc herniation abutting the L4 nerve roots bilaterally. Disc herniation questionable left L5 nerve roots. Mild spinal canal stenosis. Moderate bilateral neuroforaminal narrowing. Moderate to severe facet joint degenerative changes with small amount of fluid in the facet joints. Severe bilateral neuroforaminal narrowing.  L5-S1: Negative for disc herniation. No significant spinal canal stenosis.  Neuroforamina are patent. Mild facet joint degenerative changes.   IMPRESSION:  1. Multilevel mild disc herniations with multilevel spinal canal stenosis and neuroforaminal narrowing as described.  2. Multilevel disc desiccation and degenerative disc space narrowing as  noted.  3. Grade 1 anterolisthesis of L4 on L5 which may be degenerative in nature.  4. Negative for definite acute fracture or dislocation.  5. Nonspecific, probable sclerotic lesion in the proximal left femur. Dedicated plain films of left femur can be performed for further assessment  if not previously evaluated.     Plan of Care  Problem-specific:  No problem-specific Assessment & Plan notes found for this encounter.  Mr. EZANA HUBBERT has a current medication list which includes the following long-term medication(s): albuterol, amitriptyline, azelastine, duloxetine, eliquis, esomeprazole, [START ON 07/26/2022] hydromorphone, [START ON 08/25/2022] hydromorphone, [START ON 09/24/2022] hydromorphone, levothyroxine, loratadine, losartan, metoprolol tartrate, pregabalin, simvastatin, spironolactone, and hydromorphone.  Pharmacotherapy (Medications Ordered): Meds ordered this encounter  Medications   HYDROmorphone (DILAUDID) 4 MG tablet    Sig: Take 1 tablet (4 mg total) by mouth every 8 (eight)  hours as needed. Must last 30 days.    Dispense:  90 tablet    Refill:  0    DO NOT: delete (not duplicate); no partial-fill (will deny script to complete), no refill request (F/U required). DISPENSE: 1 day early if closed on fill date. WARN: No CNS-depressants within 8 hrs of med.   HYDROmorphone (DILAUDID) 4 MG tablet    Sig: Take 1 tablet (4 mg total) by mouth every 8 (eight) hours as needed. Must last 30 days.    Dispense:  90 tablet    Refill:  0    DO NOT: delete (not duplicate); no partial-fill (will deny script to complete), no refill request (F/U required). DISPENSE: 1 day early if closed on fill date. WARN: No CNS-depressants within 8 hrs of med.   HYDROmorphone (DILAUDID) 4 MG tablet    Sig: Take 1 tablet (4 mg total) by mouth every 8 (eight) hours as needed. Must last 30 days.    Dispense:  90 tablet    Refill:  0    DO NOT: delete (not duplicate); no partial-fill (will deny script to complete), no refill request (F/U required). DISPENSE: 1 day early if closed on fill date. WARN: No CNS-depressants within 8 hrs of med.   HYDROmorphone (DILAUDID) 4 MG tablet    Sig: Take 1 tablet (4 mg total) by mouth every 8 (eight) hours for 23 days. To last until 07/26/2022.    Dispense:  69 tablet    Refill:  0    DO NOT: delete (not duplicate); no partial-fill (will deny script to complete), no refill request (F/U required). DISPENSE: 1 day early if closed on fill date. WARN:  No CNS-depressants within 8 hrs of med.   Orders:  No orders of the defined types were placed in this encounter.  Follow-up plan:   Return in about 4 months (around 10/24/2022) for Eval-day (M,W), (F2F), (MM).      Interventional Therapies  Risk Factors  Complex Considerations:  N/A   Planned  Pending:      Under consideration:   Diagnostic/therapeutic bilateral lumbar facet MBB #1  Therapeutic/palliative left L4-5 LESI #1    Completed:   Diagnostic intrathecal pump catheter test/evaluation x1 (06/21/2021)  (Dx: Catheter malfunction/dysfunction)  Intrathecal catheter and pump removal by me (12/20/2021) (2ry to end of battery life & catheter malfunction)  Diagnostic left caudal ESI and epidurogram x1 (04/24/2021) (LBP: 50/50/0  LEP; 100/100/50-60)    Completed by other providers:   Referral (02/07/2021 & 09/19/2021) to [facility] neurosurgery Deetta Perla, MD) for ITP replacement.  Cardiology clearance to Dr. Clayborn Bigness requested (09/19/2021)    Therapeutic  Palliative (PRN) options:   None established   Pharmacotherapy:  Nonopioids transferred (03/20/2022): Pregabalin (Lyrica) 75 mg TID. Recommendations:   None at this time.     Recent Visits Date Type Provider Dept  06/17/22 Office Visit Milinda Pointer, MD [facility]  Showing recent visits within past 90 days and meeting all other requirements Today's Visits Date Type Provider Dept  07/03/22 Office Visit Milinda Pointer, MD [facility]  Showing today's visits and meeting all other requirements Future Appointments No visits were found meeting these conditions. Showing future appointments within next 90 days and meeting all other requirements  I discussed the assessment and treatment plan with the patient. The patient was provided an opportunity to ask questions and all were answered. The patient agreed with the plan and demonstrated an understanding of the instructions.  Patient advised to call back or seek an in-person evaluation if the symptoms or condition worsens.  Duration of encounter: 53 minutes.  Total time on encounter, as per AMA guidelines included both the face-to-face and non-face-to-face time personally spent by the physician and/or other qualified health care professional(s) on the day of the encounter (includes time in activities that require the physician or other qualified health care professional and does not include time in activities normally performed by clinical staff). Physician's time may include  the following activities when performed: preparing to see the patient (eg, review of tests, pre-charting review of records) obtaining and/or reviewing separately obtained history performing a medically appropriate examination and/or evaluation counseling and educating the patient/family/caregiver ordering medications, tests, or procedures referring and communicating with other health care professionals (when not separately reported) documenting clinical information in the electronic or other health record independently interpreting results (not separately reported) and communicating results to the patient/ family/caregiver care coordination (not separately reported)  Note by: [name], MD Date: 07/03/2022; Time: 8:54 AM

## 2022-07-03 NOTE — Patient Instructions (Signed)
____________________________________________________________________________________________  Patient Information update  To: All of our patients.  Re: Name change.  It has been made official that our current name, "[facility]"   will soon be changed to "[facility]".   The purpose of this change is to eliminate any confusion created by the concept of our practice being a "Medication Management Pain Clinic". In the past this has led to the misconception that we treat pain primarily by the use of prescription medications.  Nothing can be farther from the truth.   Understanding PAIN MANAGEMENT: To further understand what our practice does, you first have to understand that "Pain Management" is a subspecialty that requires additional training once a physician has completed their specialty training, which can be in either Anesthesia, Neurology, Psychiatry, or Physical Medicine and Rehabilitation (PMR). Each one of these contributes to the final approach taken by each physician to the management of their patient's pain. To be a "Pain Management Specialist" you must have first completed one of the specialty trainings below.  Anesthesiologists - trained in clinical pharmacology and interventional techniques such as nerve blockade and regional as well as central neuroanatomy. They are trained to block pain before, during, and after surgical interventions.  Neurologists - trained in the diagnosis and pharmacological treatment of complex neurological conditions, such as Multiple Sclerosis, Parkinson's, spinal cord injuries, and other systemic conditions that may be associated with symptoms that may include but are not limited to pain. They tend to rely primarily on the treatment of chronic pain using prescription medications.  Psychiatrist - trained in conditions affecting the psychosocial  wellbeing of patients including but not limited to depression, anxiety, schizophrenia, personality disorders, addiction, and other substance use disorders that may be associated with chronic pain. They tend to rely primarily on the treatment of chronic pain using prescription medications.   Physical Medicine and Rehabilitation (PMR) physicians, also known as physiatrists - trained to treat a wide variety of medical conditions affecting the brain, spinal cord, nerves, bones, joints, ligaments, muscles, and tendons. Their training is primarily aimed at treating patients that have suffered injuries that have caused severe physical impairment. Their training is primarily aimed at the physical therapy and rehabilitation of those patients. They may also work alongside orthopedic surgeons or neurosurgeons using their expertise in assisting surgical patients to recover after their surgeries.  INTERVENTIONAL PAIN MANAGEMENT is sub-subspecialty of Pain Management.  Our physicians are Board-certified in Anesthesia, Pain Management, and Interventional Pain Management.  This meaning that not only have they been trained and Board-certified in their specialty of Anesthesia, and subspecialty of Pain Management, but they have also received further training in the sub-subspecialty of Interventional Pain Management, in order to become Board-certified as INTERVENTIONAL PAIN MANAGEMENT SPECIALIST.    Mission: Our goal is to use our skills in  INTERVENTIONAL PAIN MANAGEMENT as alternatives to the chronic use of prescription opioid medications for the treatment of pain. To make this more clear, we have changed our name to reflect what we do and offer. We will continue to offer medication management assessment and recommendations, but we will not be taking over any patient's medication management.  ____________________________________________________________________________________________      ____________________________________________________________________________________________  National Pain Medication Shortage  The [place] is experiencing worsening drug shortages. These have had a negative widespread effect on patient care and treatment. Not expected to improve any time soon. Predicted to last past 2029.   Drug shortage list (generic   names) Oxycodone IR Oxycodone/APAP Oxymorphone IR Hydromorphone Hydrocodone/APAP Morphine  Where is the problem?  Manufacturing and supply level.  Will this shortage affect you?  Only if you take any of the above pain medications.  How? You may be unable to fill your prescription.  Your pharmacist may offer a "partial fill" of your prescription. (Warning: Do not accept partial fills.) Prescriptions partially filled cannot be transferred to another pharmacy. Read our Medication Rules and Regulation. Depending on how much medicine you are dependent on, you may experience withdrawals when unable to get the medication.  Recommendations: Consider ending your dependence on opioid pain medications. Ask your pain specialist to assist you with the process. Consider switching to a medication currently not in shortage, such as Buprenorphine. Talk to your pain specialist about this option. Consider decreasing your pain medication requirements by managing tolerance thru "Drug Holidays". This may help minimize withdrawals, should you run out of medicine. Control your pain thru the use of non-pharmacological interventional therapies.   Your prescriber: Prescribers cannot be blamed for shortages. Medication manufacturing and supply issues cannot be fixed by the prescriber.   NOTE: The prescriber is not responsible for supplying the medication, or solving supply issues. Work with your pharmacist to solve it. The patient is responsible for the decision to take or continue taking the medication and for identifying and securing a legal supply source. By  law, supplying the medication is the job and responsibility of the pharmacy. The prescriber is responsible for the evaluation, monitoring, and prescribing of these medications.   Prescribers will NOT: Re-issue prescriptions that have been partially filled. Re-issue prescriptions already sent to a pharmacy.  Re-send prescriptions to a different pharmacy because yours did not have your medication. Ask pharmacist to order more medicine or transfer the prescription to another pharmacy. (Read below.)  New 2023 regulation: "March 22, 2022 Revised Regulation Allows DEA-Registered Pharmacies to Transfer Electronic Prescriptions at a Patient's Request DEA Headquarters Division - Public Information Office Patients now have the ability to request their electronic prescription be transferred to another pharmacy without having to go back to their practitioner to initiate the request. This revised regulation went into effect on Monday, March 18, 2022.     At a patient's request, a DEA-registered retail pharmacy can now transfer an electronic prescription for a controlled substance (schedules II-V) to another DEA-registered retail pharmacy. Prior to this change, patients would have to go through their practitioner to cancel their prescription and have it re-issued to a different pharmacy. The process was taxing and time consuming for both patients and practitioners.    The Drug Enforcement Administration (DEA) published its intent to revise the process for transferring electronic prescriptions on June 09, 2020.  The final rule was published in the federal register on February 14, 2022 and went into effect 30 days later.  Under the final rule, a prescription can only be transferred once between pharmacies, and only if allowed under existing state or other applicable law. The prescription must remain in its electronic form; may not be altered in any way; and the transfer must be communicated directly between  two licensed pharmacists. It's important to note, any authorized refills transfer with the original prescription, which means the entire prescription will be filled at the same pharmacy".  Reference: https://www.dea.gov/stories/2023/2023-03/2022-09-01/revised-regulation-allows-dea-registered-pharmacies-transfer (DEA website announcement)  https://www.govinfo.gov/content/pkg/FR-2022-02-14/pdf/2023-15847.pdf (Federal Register  Department of Justice)   Federal Register / Vol. 88, No. 143 / Thursday, February 14, 2022 / Rules and Regulations DEPARTMENT OF JUSTICE  Drug Enforcement   Administration  21 CFR Part 1306  [Docket No. [id number]]  RIN 1117-AB64 Transfer of Electronic Prescriptions for Schedules II-V Controlled Substances Between Pharmacies for Initial Filling  ____________________________________________________________________________________________     ____________________________________________________________________________________________  Drug Holidays  What is a "Drug Holiday"? Drug Holiday: is the name given to the process of slowly tapering down and temporarily stopping the pain medication for the purpose of decreasing or eliminating tolerance to the drug.  Benefits Improved effectiveness Decreased required effective dose Improved pain control End dependence on high dose therapy Decrease cost of therapy Uncovering "opioid-induced hyperalgesia". (OIH)  What is "opioid hyperalgesia"? It is a paradoxical increase in pain caused by exposure to opioids. Stopping the opioid pain medication, contrary to the expected, it actually decreases or completely eliminates the pain. Ref.: "A comprehensive review of opioid-induced hyperalgesia". Marion Lee, et.al. Pain Physician. 2011 Mar-Apr;14(2):145-61.  What is tolerance? Tolerance: the progressive loss of effectiveness of a pain medicine due to repetitive use. A common problem of opioid pain medications.  How long should a "Drug  Holiday" last? Effectiveness depends on the patient staying off all opioid pain medicines for a minimum of 14 consecutive days. (2 weeks)  How about just taking less of the medicine? Does not work. Will not accomplish goal of eliminating the excess receptors.  How about switching to a different pain medicine? (AKA. "Opioid rotation") Does not work. Creates the illusion of effectiveness by taking advantage of inaccurate equivalent dose calculations between different opioids. -This "technique" was promoted by studies funded by pharmaceutical companies, such as [employer], creators of "OxyContin".  Can I stop the medicine "cold turkey"? Depends. You should always coordinate with your Pain Specialist to make the transition as smoothly as possible. Avoid stopping the medicine abruptly without consulting. We recommend a "slow taper".  What is a slow taper? Taper: refers to the gradual decrease in dose.   How do I stop/taper the dose? Slowly. Decrease the daily amount of pills that you take by one (1) pill every seven (7) days. This is called a "slow downward taper". Example: if you normally take four (4) pills per day, drop it to three (3) pills per day for seven (7) days, then to two (2) pills per day for seven (7) days, then to one (1) per day for seven (7) days, and then stop the medicine. The 14 day "Drug Holiday" starts on the first day without medicine.   Will I experience withdrawals? Unlikely with a slow taper.  What triggers withdrawals? Withdrawals are triggered by the sudden/abrupt stop of high dose opioids. Withdrawals can be avoided by slowly decreasing the dose over a prolonged period of time.  What are withdrawals? Symptoms associated with sudden/abrupt reduction/stopping of high-dose, long-term use of pain medication. Withdrawal are seldom seen on low dose therapy, or patients rarely taking opioid medication.  Early Withdrawal Symptoms may include: Agitation Anxiety Muscle  aches Increased tearing Insomnia Runny nose Sweating Yawning  Late symptoms may include: Abdominal cramping Diarrhea Dilated pupils Goose bumps Nausea Vomiting  (Last update: 06/30/2022) ____________________________________________________________________________________________    _______________________________________________________________________  Medication Rules  Purpose: To inform patients, and their family members, of our medication rules and regulations.  Applies to: All patients receiving prescriptions from our practice (written or electronic).  Pharmacy of record: This is the pharmacy where your electronic prescriptions will be sent. Make sure we have the correct one.  Electronic prescriptions: In compliance with the [place] Strengthen Opioid Misuse Prevention (STOP) Act of 2017 (Session Law 2017-74/H243), effective July 22, 2018, all controlled substances must be electronically prescribed. Written prescriptions, faxing, or calling prescriptions to a pharmacy will no longer be done.  Prescription refills: These will be provided only during in-person appointments. No medications will be renewed without a "face-to-face" evaluation with your provider. Applies to all prescriptions.  NOTE: The following applies primarily to controlled substances (Opioid* Pain Medications).   Type of encounter (visit): For patients receiving controlled substances, face-to-face visits are required. (Not an option and not up to the patient.)  Patient's responsibilities: Pain Pills: Bring all pain pills to every appointment (except for procedure appointments). Pill Bottles: Bring pills in original pharmacy bottle. Bring bottle, even if empty. Always bring the bottle of the most recent fill.  Medication refills: You are responsible for knowing and keeping track of what medications you are taking and when is it that you will need a refill. The day before your appointment: write a  list of all prescriptions that need to be refilled. The day of the appointment: give the list to the admitting nurse. Prescriptions will be written only during appointments. No prescriptions will be written on procedure days. If you forget a medication: it will not be "Called in", "Faxed", or "electronically sent". You will need to get another appointment to get these prescribed. No early refills. Do not call asking to have your prescription filled early. Partial  or short prescriptions: Occasionally your pharmacy may not have enough pills to fill your prescription.  NEVER ACCEPT a partial fill or a prescription that is short of the total amount of pills that you were prescribed.  With controlled substances the law allows 72 hours for the pharmacy to complete the prescription.  If the prescription is not completed within 72 hours, the pharmacist will require a new prescription to be written. This means that you will be short on your medicine and we WILL NOT send another prescription to complete your original prescription.  Instead, request the pharmacy to send a carrier to a nearby branch to get enough medication to provide you with your full prescription. Prescription Accuracy: You are responsible for carefully inspecting your prescriptions before leaving our office. Have the discharge nurse carefully go over each prescription with you, before taking them home. Make sure that your name is accurately spelled, that your address is correct. Check the name and dose of your medication to make sure it is accurate. Check the number of pills, and the written instructions to make sure they are clear and accurate. Make sure that you are given enough medication to last until your next medication refill appointment. Taking Medication: Take medication as prescribed. When it comes to controlled substances, taking less pills or less frequently than prescribed is permitted and encouraged. Never take more pills than  instructed. Never take the medication more frequently than prescribed.  Inform other Doctors: Always inform, all of your healthcare providers, of all the medications you take. Pain Medication from other Providers: You are not allowed to accept any additional pain medication from any other Doctor or Healthcare provider. There are two exceptions to this rule. (see below) In the event that you require additional pain medication, you are responsible for notifying us, as stated below. Cough Medicine: Often these contain an opioid, such as codeine or hydrocodone. Never accept or take cough medicine containing these opioids if you are already taking an opioid* medication. The combination may cause respiratory failure and death. Medication Agreement: You are responsible for carefully reading and following our Medication Agreement. This   must be signed before receiving any prescriptions from our practice. Safely store a copy of your signed Agreement. Violations to the Agreement will result in no further prescriptions. (Additional copies of our Medication Agreement are available upon request.) Laws, Rules, & Regulations: All patients are expected to follow all Federal and State Laws, Statutes, Rules, & Regulations. Ignorance of the Laws does not constitute a valid excuse.  Illegal drugs and Controlled Substances: The use of illegal substances (including, but not limited to marijuana and its derivatives) and/or the illegal use of any controlled substances is strictly prohibited. Violation of this rule may result in the immediate and permanent discontinuation of any and all prescriptions being written by our practice. The use of any illegal substances is prohibited. Adopted CDC guidelines & recommendations: Target dosing levels will be at or below 60 MME/day. Use of benzodiazepines** is not recommended.  Exceptions: There are only two exceptions to the rule of not receiving pain medications from other Healthcare  Providers. Exception #1 (Emergencies): In the event of an emergency (i.e.: accident requiring emergency care), you are allowed to receive additional pain medication. However, you are responsible for: As soon as you are able, call our office [phone], at any time of the day or night, and leave a message stating your name, the date and nature of the emergency, and the name and dose of the medication prescribed. In the event that your call is answered by a member of our staff, make sure to document and save the date, time, and the name of the person that took your information.  Exception #2 (Planned Surgery): In the event that you are scheduled by another doctor or dentist to have any type of surgery or procedure, you are allowed (for a period no longer than 30 days), to receive additional pain medication, for the acute post-op pain. However, in this case, you are responsible for picking up a copy of our "Post-op Pain Management for Surgeons" handout, and giving it to your surgeon or dentist. This document is available at our office, and does not require an appointment to obtain it. Simply go to our office during business hours (Monday-Thursday from 8:00 AM to 4:00 PM) (Friday 8:00 AM to 12:00 Noon) or if you have a scheduled appointment with us, prior to your surgery, and ask for it by name. In addition, you are responsible for: calling our office [phone], at any time of the day or night, and leaving a message stating your name, name of your surgeon, type of surgery, and date of procedure or surgery. Failure to comply with your responsibilities may result in termination of therapy involving the controlled substances. Medication Agreement Violation. Following the above rules, including your responsibilities will help you in avoiding a Medication Agreement Violation ("Breaking your Pain Medication Contract").  Consequences:  Not following the above rules may result in permanent discontinuation of  medication prescription therapy.  *Opioid medications include: morphine, codeine, oxycodone, oxymorphone, hydrocodone, hydromorphone, meperidine, tramadol, tapentadol, buprenorphine, fentanyl, methadone. **Benzodiazepine medications include: diazepam (Valium), alprazolam (Xanax), clonazepam (Klonopine), lorazepam (Ativan), clorazepate (Tranxene), chlordiazepoxide (Librium), estazolam (Prosom), oxazepam (Serax), temazepam (Restoril), triazolam (Halcion) (Last updated: 05/14/2022) ______________________________________________________________________    ______________________________________________________________________  Medication Recommendations and Reminders  Applies to: All patients receiving prescriptions (written and/or electronic).  Medication Rules & Regulations: You are responsible for reading, knowing, and following our "Medication Rules" document. These exist for your safety and that of others. They are not flexible and neither are we. Dismissing or ignoring them is an   act of "non-compliance" that may result in complete and irreversible termination of such medication therapy. For safety reasons, "non-compliance" will not be tolerated. As with the [place] fundamental legal principle of "ignorance of the law is no defense", we will accept no excuses for not having read and knowing the content of documents provided to you by our practice.  Pharmacy of record:  Definition: This is the pharmacy where your electronic prescriptions will be sent.  We do not endorse any particular pharmacy. It is up to you and your insurance to decide what pharmacy to use.  We do not restrict you in your choice of pharmacy. However, once we write for your prescriptions, we will NOT be re-sending more prescriptions to fix restricted supply problems created by your pharmacy, or your insurance.  The pharmacy listed in the electronic medical record should be the one where you want electronic prescriptions to be  sent. If you choose to change pharmacy, simply notify our nursing staff. Changes will be made only during your regular appointments and not over the phone.  Recommendations: Keep all of your pain medications in a safe place, under lock and key, even if you live alone. We will NOT replace lost, stolen, or damaged medication. We do not accept "Police Reports" as proof of medications having been stolen. After you fill your prescription, take 1 week's worth of pills and put them away in a safe place. You should keep a separate, properly labeled bottle for this purpose. The remainder should be kept in the original bottle. Use this as your primary supply, until it runs out. Once it's gone, then you know that you have 1 week's worth of medicine, and it is time to come in for a prescription refill. If you do this correctly, it is unlikely that you will ever run out of medicine. To make sure that the above recommendation works, it is very important that you make sure your medication refill appointments are scheduled at least 1 week before you run out of medicine. To do this in an effective manner, make sure that you do not leave the office without scheduling your next medication management appointment. Always ask the nursing staff to show you in your prescription , when your medication will be running out. Then arrange for the receptionist to get you a return appointment, at least 7 days before you run out of medicine. Do not wait until you have 1 or 2 pills left, to come in. This is very poor planning and does not take into consideration that we may need to cancel appointments due to bad weather, sickness, or emergencies affecting our staff. DO NOT ACCEPT A "Partial Fill": If for any reason your pharmacy does not have enough pills/tablets to completely fill or refill your prescription, do not allow for a "partial fill". The law allows the pharmacy to complete that prescription within 72 hours, without requiring a new  prescription. If they do not fill the rest of your prescription within those 72 hours, you will need a separate prescription to fill the remaining amount, which we will NOT provide. If the reason for the partial fill is your insurance, you will need to talk to the pharmacist about payment alternatives for the remaining tablets, but again, DO NOT ACCEPT A PARTIAL FILL, unless you can trust your pharmacist to obtain the remainder of the pills within 72 hours.  Prescription refills and/or changes in medication(s):  Prescription refills, and/or changes in dose or medication, will be conducted only   during scheduled medication management appointments. (Applies to both, written and electronic prescriptions.) No refills on procedure days. No medication will be changed or started on procedure days. No changes, adjustments, and/or refills will be conducted on a procedure day. Doing so will interfere with the diagnostic portion of the procedure. No phone refills. No medications will be "called into the pharmacy". No Fax refills. No weekend refills. No Holliday refills. No after hours refills.  Remember:  Business hours are:  Monday to Thursday 8:00 AM to 4:00 PM Provider's Schedule: Jawad Wiacek, MD - Appointments are:  Medication management: Monday and Wednesday 8:00 AM to 4:00 PM Procedure day: Tuesday and Thursday 7:30 AM to 4:00 PM Bilal Lateef, MD - Appointments are:  Medication management: Tuesday and Thursday 8:00 AM to 4:00 PM Procedure day: Monday and Wednesday 7:30 AM to 4:00 PM (Last update: 05/14/2022) ______________________________________________________________________    ____________________________________________________________________________________________  WARNING: CBD (cannabidiol) & Delta (Delta-8 tetrahydrocannabinol) products.   Applicable to:  All individuals currently taking or considering taking CBD (cannabidiol) and, more important, all patients taking opioid  analgesic controlled substances (pain medication). (Example: oxycodone; oxymorphone; hydrocodone; hydromorphone; morphine; methadone; tramadol; tapentadol; fentanyl; buprenorphine; butorphanol; dextromethorphan; meperidine; codeine; etc.)  Introduction:  Recently there has been a drive towards the use of "natural" products for the treatment of different conditions, including pain anxiety and sleep disorders. Marijuana and hemp are two varieties of the cannabis genus plants. Marijuana and its derivatives are illegal, while hemp and its derivatives are not. Cannabidiol (CBD) and tetrahydrocannabinol (THC), are two natural compounds found in plants of the Cannabis genus. They can both be extracted from hemp or marijuana. Both compounds interact with your body's endocannabinoid system in very different ways. CBD is associated with pain relief (analgesia) while THC is associated with the psychoactive effects ("the high") obtained from the use of marijuana products. There are two main types of THC: Delta-9, which comes from the marijuana plant and it is illegal, and Delta-8, which comes from the hemp plant, and it is legal. (Both, Delta-9-THC and Delta-8-THC are psychoactive and give you "the high".)   Legality:  Marijuana and its derivatives: illegal Hemp and its derivatives: Legal (State dependent) UPDATE: (09/07/2021) The Drug Enforcement Agency (DEA) issued a letter stating that "delta" cannabinoids, including Delta-8-THCO and Delta-9-THCO, synthetically derived from hemp do not qualify as hemp and will be viewed as Schedule I drugs. (Schedule I drugs, substances, or chemicals are defined as drugs with no currently accepted medical use and a high potential for abuse. Some examples of Schedule I drugs are: heroin, lysergic acid diethylamide (LSD), marijuana (cannabis), 3,4-methylenedioxymethamphetamine (ecstasy), methaqualone, and peyote.) (https://www.dea.gov)  Legal status of CBD in [place]:  "Conditionally  Legal"  Reference: "FDA Regulation of Cannabis and Cannabis-Derived Products, Including Cannabidiol (CBD)" - https://www.fda.gov/news-events/public-health-focus/fda-regulation-cannabis-and-cannabis-derived-products-including-cannabidiol-cbd  Warning:  CBD is not FDA approved and has not undergo the same manufacturing controls as prescription drugs.  This means that the purity and safety of available CBD may be questionable. Most of the time, despite manufacturer's claims, it is contaminated with THC (delta-9-tetrahydrocannabinol - the chemical in marijuana responsible for the "HIGH").  When this is the case, the THC contaminant will trigger a positive urine drug screen (UDS) test for Marijuana (carboxy-THC).   The FDA recently put out a warning about 5 things that everyone should be aware of regarding Delta-8 THC: Delta-8 THC products have not been evaluated or approved by the FDA for safe use and may be marketed in ways that put the public health at   risk. The FDA has received adverse event reports involving delta-8 THC-containing products. Delta-8 THC has psychoactive and intoxicating effects. Delta-8 THC manufacturing often involve use of potentially harmful chemicals to create the concentrations of delta-8 THC claimed in the marketplace. The final delta-8 THC product may have potentially harmful by-products (contaminants) due to the chemicals used in the process. Manufacturing of delta-8 THC products may occur in uncontrolled or unsanitary settings, which may lead to the presence of unsafe contaminants or other potentially harmful substances. Delta-8 THC products should be kept out of the reach of children and pets.  NOTE: Because a positive UDS for any illicit substance is a violation of our medication agreement, your opioid analgesics (pain medicine) may be permanently discontinued.  MORE ABOUT CBD  General Information: CBD was discovered in 1940 and it is a derivative of the cannabis sativa  genus plants (Marijuana and Hemp). It is one of the 113 identified substances found in Marijuana. It accounts for up to 40% of the plant's extract. As of 2018, preliminary clinical studies on CBD included research for the treatment of anxiety, movement disorders, and pain. CBD is available and consumed in multiple forms, including inhalation of smoke or vapor, as an aerosol spray, and by mouth. It may be supplied as an oil containing CBD, capsules, dried cannabis, or as a liquid solution. CBD is thought not to be as psychoactive as THC (delta-9-tetrahydrocannabinol - the chemical in marijuana responsible for the "HIGH"). Studies suggest that CBD may interact with different biological target receptors in the body, including cannabinoid and other neurotransmitter receptors. As of 2018 the mechanism of action for its biological effects has not been determined.  Side-effects  Adverse reactions: Dry mouth, diarrhea, decreased appetite, fatigue, drowsiness, malaise, weakness, sleep disturbances, and others.  Drug interactions:  CBD may interact with medications such as blood-thinners. CBD causes drowsiness on its own and it will increase drowsiness caused by other medications, including antihistamines (such as Benadryl), benzodiazepines (Xanax, Ativan, Valium), antipsychotics, antidepressants, opioids, alcohol and supplements such as kava, melatonin and St. John's Wort.  Other drug interactions: Brivaracetam (Briviact); Caffeine; Carbamazepine (Tegretol); Citalopram (Celexa); Clobazam (Onfi); Eslicarbazepine (Aptiom); Everolimus (Zostress); Lithium; Methadone (Dolophine); Rufinamide (Banzel); Sedative medications (CNS depressants); Sirolimus (Rapamune); Stiripentol (Diacomit); Tacrolimus (Prograf); Tamoxifen ; Soltamox); Topiramate (Topamax); Valproate; Warfarin (Coumadin); Zonisamide. (Last update: 07/01/2022) ____________________________________________________________________________________________    ____________________________________________________________________________________________  Naloxone Nasal Spray  Why am I receiving this medication? [place] STOP ACT requires that all patients taking high dose opioids or at risk of opioids respiratory depression, be prescribed an opioid reversal agent, such as Naloxone (AKA: Narcan).  What is this medication? NALOXONE (nal OX one) treats opioid overdose, which causes slow or shallow breathing, severe drowsiness, or trouble staying awake. Call emergency services after using this medication. You may need additional treatment. Naloxone works by reversing the effects of opioids. It belongs to a group of medications called opioid blockers.  COMMON BRAND NAME(S): Kloxxado, Narcan  What should I tell my care team before I take this medication? They need to know if you have any of these conditions: Heart disease Substance use disorder An unusual or allergic reaction to naloxone, other medications, foods, dyes, or preservatives Pregnant or trying to get pregnant Breast-feeding  When to use this medication? This medication is to be used for the treatment of respiratory depression (less than 8 breaths per minute) secondary to opioid overdose.   How to use this medication? This medication is for use in the nose. Lay the person on their   back. Support their neck with your hand and allow the head to tilt back before giving the medication. The nasal spray should be given into 1 nostril. After giving the medication, move the person onto their side. Do not remove or test the nasal spray until ready to use. Get emergency medical help right away after giving the first dose of this medication, even if the person wakes up. You should be familiar with how to recognize the signs and symptoms of a narcotic overdose. If more doses are needed, give the additional dose in the other nostril. Talk to your care team about the use of this medication in children.  While this medication may be prescribed for children as young as newborns for selected conditions, precautions do apply.  Naloxone Overdosage: If you think you have taken too much of this medicine contact a poison control center or emergency room at once.  NOTE: This medicine is only for you. Do not share this medicine with others.  What if I miss a dose? This does not apply.  What may interact with this medication? This is only used during an emergency. No interactions are expected during emergency use. This list may not describe all possible interactions. Give your health care provider a list of all the medicines, herbs, non-prescription drugs, or dietary supplements you use. Also tell them if you smoke, drink alcohol, or use illegal drugs. Some items may interact with your medicine.  What should I watch for while using this medication? Keep this medication ready for use in the case of an opioid overdose. Make sure that you have the phone number of your care team and local hospital ready. You may need to have additional doses of this medication. Each nasal spray contains a single dose. Some emergencies may require additional doses. After use, bring the treated person to the nearest hospital or call 911. Make sure the treating care team knows that the person has received a dose of this medication. You will receive additional instructions on what to do during and after use of this medication before an emergency occurs.  What side effects may I notice from receiving this medication? Side effects that you should report to your care team as soon as possible: Allergic reactions--skin rash, itching, hives, swelling of the face, lips, tongue, or throat Side effects that usually do not require medical attention (report these to your care team if they continue or are bothersome): Constipation Dryness or irritation inside the nose Headache Increase in blood pressure Muscle spasms Stuffy  nose Toothache This list may not describe all possible side effects. Call your doctor for medical advice about side effects. You may report side effects to FDA at 1-800-FDA-1088.  Where should I keep my medication? Because this is an emergency medication, you should keep it with you at all times.  Keep out of the reach of children and pets. Store between 20 and 25 degrees C (68 and 77 degrees F). Do not freeze. Throw away any unused medication after the expiration date. Keep in original box until ready to use.  NOTE: This sheet is a summary. It may not cover all possible information. If you have questions about this medicine, talk to your doctor, pharmacist, or health care provider.   2023 Elsevier/Gold Standard (2021-03-16 00:00:00)  ____________________________________________________________________________________________   

## 2022-07-03 NOTE — Progress Notes (Signed)
Nursing Pain Medication Assessment:  Safety precautions to be maintained throughout the outpatient stay will include: orient to surroundings, keep bed in low position, maintain call bell within reach at all times, provide assistance with transfer out of bed and ambulation.  Medication Inspection Compliance: Pill count conducted under aseptic conditions, in front of the patient. Neither the pills nor the bottle was removed from the patient's sight at any time. Once count was completed pills were immediately returned to the patient in their original bottle.  Medication: Hydromorphone (Dilaudid) Pill/Patch Count:  0 of 27 pills remain Pill/Patch Appearance: Markings consistent with prescribed medication Bottle Appearance: Standard pharmacy container. Clearly labeled. Filled Date: 44 / 27 / 2023 Last Medication intake:  YesterdaySafety precautions to be maintained throughout the outpatient stay will include: orient to surroundings, keep bed in low position, maintain call bell within reach at all times, provide assistance with transfer out of bed and ambulation.

## 2022-08-05 ENCOUNTER — Telehealth: Payer: Self-pay

## 2022-08-05 ENCOUNTER — Other Ambulatory Visit: Payer: Self-pay

## 2022-08-05 ENCOUNTER — Telehealth: Payer: Self-pay | Admitting: Pain Medicine

## 2022-08-05 DIAGNOSIS — M961 Postlaminectomy syndrome, not elsewhere classified: Secondary | ICD-10-CM

## 2022-08-05 DIAGNOSIS — G894 Chronic pain syndrome: Secondary | ICD-10-CM

## 2022-08-05 DIAGNOSIS — Z79891 Long term (current) use of opiate analgesic: Secondary | ICD-10-CM

## 2022-08-05 DIAGNOSIS — Z79899 Other long term (current) drug therapy: Secondary | ICD-10-CM

## 2022-08-05 DIAGNOSIS — G8929 Other chronic pain: Secondary | ICD-10-CM

## 2022-08-05 MED ORDER — HYDROMORPHONE HCL 4 MG PO TABS: 4.0000 mg | ORAL_TABLET | Freq: Three times a day (TID) | ORAL | 0 refills | Status: DC | PRN

## 2022-08-05 NOTE — Telephone Encounter (Signed)
Patient notified

## 2022-08-05 NOTE — Telephone Encounter (Signed)
[name] called stating someone told her to find a pharmacy that has pain med. [facility].

## 2022-08-05 NOTE — Telephone Encounter (Signed)
Refill request sent to Dr Naveira  

## 2022-08-06 ENCOUNTER — Other Ambulatory Visit: Payer: Self-pay | Admitting: Pain Medicine

## 2022-08-06 DIAGNOSIS — G894 Chronic pain syndrome: Secondary | ICD-10-CM

## 2022-08-06 DIAGNOSIS — Z79899 Other long term (current) drug therapy: Secondary | ICD-10-CM

## 2022-08-06 DIAGNOSIS — G8929 Other chronic pain: Secondary | ICD-10-CM

## 2022-08-06 DIAGNOSIS — M961 Postlaminectomy syndrome, not elsewhere classified: Secondary | ICD-10-CM

## 2022-08-06 DIAGNOSIS — Z79891 Long term (current) use of opiate analgesic: Secondary | ICD-10-CM

## 2022-08-07 ENCOUNTER — Encounter: Payer: Self-pay | Admitting: Cardiology

## 2022-08-07 ENCOUNTER — Ambulatory Visit: Payer: Medicare (Managed Care) | Attending: Cardiology | Admitting: Cardiology

## 2022-08-07 VITALS — BP 160/90 | HR 68 | Ht 75.0 in | Wt 210.0 lb

## 2022-08-07 DIAGNOSIS — I5022 Chronic systolic (congestive) heart failure: Secondary | ICD-10-CM

## 2022-08-07 DIAGNOSIS — J9611 Chronic respiratory failure with hypoxia: Secondary | ICD-10-CM

## 2022-08-07 DIAGNOSIS — I493 Ventricular premature depolarization: Secondary | ICD-10-CM

## 2022-08-07 MED ORDER — MEXILETINE HCL 150 MG PO CAPS: 150.0000 mg | ORAL_CAPSULE | Freq: Two times a day (BID) | ORAL | 3 refills | Status: DC

## 2022-08-07 NOTE — Progress Notes (Signed)
Electrophysiology Office Note:    Date:  08/07/2022   ID:  [name], DOB April 24, 1946, MRN [id number]  PCP:  Adin Hector, MD  [facility] Cardiologist:  None  [facility] Electrophysiologist:  None   Referring MD: Yolonda Kida, MD   Chief Complaint: A-fib and PVCs  History of Present Illness:    [name] is a 77 y.o. male who presents for an evaluation of A-fib and PVCs at the request of [place]. Their medical history includes chronic systolic heart failure, paroxysmal atrial fibrillation, coronary artery disease, chronic respiratory failure on supplemental oxygen, COPD, hypertension, diabetes, DVT history, GERD, CKD, bladder cancer, tobacco abuse.  The patient has extensive coronary artery disease with a history of a STEMI treated with a stent to the proximal LAD in 2006.  The patient was seen by Gladstone Pih on April 04, 2022.  That was after the patient wore a heart monitor which showed 18% PVC burden.  He has a history of atrial fibrillation and takes Eliquis for stroke prophylaxis.  The recent heart monitor showed no episodes of A-fib.  He is with his wife today in clinic.  He is tolerating Eliquis.  He does not feel his PVCs.  He does report weakness and fatigue.  He has reduced exercise capacity.  He stopped using his oxygen.     Past Medical History:  Diagnosis Date   Acute anterolateral wall MI ([place]) 2000   a.) PCI with stent x 1 to LCx (type unknown).   Acute non-Q wave non-ST elevation myocardial infarction (NSTEMI) ([place]) 12/03/2004   a.) LHC 12/04/2004: EF 50%; 75% pLAD, 30% pRCA, 30% mRCA --> PCI performed placing a 3.0 x 13 mm Cypher DES to pLAD.   Adenomatous polyp of colon    Agent orange exposure    a.) [place] War   Angina pectoris ([place])    Anxiety    Aortic atherosclerosis ([place])    Asthma    Bladder stones    Bradycardia    CHF (congestive heart failure) (HCC)    Chronic lower back pain    a.) post-laminectomy syndrome    Chronic pain syndrome    a.) on COT; has naloxone Rx in place   COPD (chronic obstructive pulmonary disease) ([place])    Coronary artery disease    a.) LHC 04/18/1987: normal cors.  b.) MI 2000 --> PCI with stent x 1 to LCx (unknown type). c.) LHC 02/03/2002: 20% pRCA, 20% mRCA-1, 20% mRCA-2, 50% mLAD; med mgmt. d.) NSTEMI 12/04/2004 --> LHC 75% pLAD, 30% pRCA, 30% mRCA --> PCI placing a 3.0x6m Cypher DES. e.) LHC 02/19/2008: 40 % mLAD, 25% pLCx, 30% pRCA-1, 40% pRCA-2, 40% mRCA, 10% dRCA; 10% ISR pLAD and LCx; med mgmt   Depression    DJD (degenerative joint disease)    DVT of lower extremity, bilateral ([place]    a.) IVC filter placed 09/2007; removed 2011.   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 b.) rate/rhythm maintained without pharmacological intervention; not currently on chronic anticoagulation therapy   Paralysis of RIGHT diaphragm    Peripheral vascular disease (HCC)    Rhabdomyolysis 2006   Senile purpura ([place])    Sepsis due to pneumonia ([place]) 10/28/2021   Stasis dermatitis of both legs    T2DM (type 2 diabetes mellitus) ([place])    Therapeutic opioid-induced constipation (OIC)    a.) takes daily Senokot-S + bisacodyl    Past Surgical History:  Procedure Laterality Date   COLONOSCOPY N/A 11/22/2013   COLONOSCOPY N/A 09/19/2008   COLONOSCOPY N/A 08/25/2003    COLONOSCOPY N/A 10/15/1994   COLONOSCOPY WITH PROPOFOL N/A 02/27/2018   Procedure: COLONOSCOPY WITH PROPOFOL;  Surgeon: Manya Silvas, MD;  Location[facility] ENDOSCOPY;  Service: Endoscopy;  Laterality: N/A;   CORONARY ANGIOPLASTY     2000   CORONARY ANGIOPLASTY WITH STENT PLACEMENT Left 12/04/2004   Procedure: CORONARY ANGIOPLASTY WITH STENT PLACEMENT; Location: [facility]; Surgeon: Serafina Royals, MD   EXTERNAL EAR SURGERY     x3   INTRATHECAL PUMP IMPLANTATION N/A 03/2008   INTRATHECAL PUMP REVISION N/A 09/28/2014   IVC FILTER PLACEMENT ([place] HX) N/A 09/2007   IVC FILTER REMOVAL N/A 2011   LEFT HEART CATH AND CORONARY ANGIOGRAPHY Left 02/19/2008   Procedure: LEFT HEART CATH AND CORONARY ANGIOGRAPHY; Location: [facility]; Surgeon: Serafina Royals, MD   LEFT HEART CATH AND CORONARY ANGIOGRAPHY Left 04/18/1987   Procedure: LEFT HEART CATH AND CORONARY ANGIOGRAPHY; Location: [facility]; Surgeon: Sueanne Margarita, MD   LEFT HEART CATH AND CORONARY ANGIOGRAPHY Left 02/03/2002   Procedure: LEFT HEART CATH AND CORONARY ANGIOGRAPHY; Location: [facility]; Surgeon: Katrine Coho, MD   MASTOIDECTOMY     x 3   NISSEN FUNDOPLICATION N/A 4132   PAIN PUMP REMOVAL N/A 12/20/2021   Procedure: PAIN PUMP REMOVAL;  Surgeon: Milinda Pointer, MD;  Location: [facility] ORS;  Service: Neurosurgery;  Laterality: N/A;   PARTIAL COLECTOMY N/A    Procedure: PARTIAL BOWEL RESECTION WITH LYSIS OF ADHESIONS   POSTERIOR LAMINECTOMY / DECOMPRESSION CERVICAL SPINE N/A 1984   POSTERIOR LAMINECTOMY / DECOMPRESSION CERVICAL SPINE N/A 1985   POSTERIOR LUMBAR FUSION N/A 1999   SKIN CANCER EXCISION     TONSILLECTOMY Bilateral 1958   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   Procedure: TRANSURETHRAL RESECTION OF BLADDER TUMOR (TURBT);  Surgeon: Royston Cowper, MD;  Location: [facility] ORS;  Service: Urology;  Laterality: N/A;    Current Medications: Current Meds  Medication Sig   albuterol (VENTOLIN HFA) 108 (90 Base) MCG/ACT inhaler  Inhale into the lungs every 6 (six) hours as needed for wheezing or shortness of breath.   amitriptyline (ELAVIL) 100 MG tablet Take 100 mg by mouth at bedtime.   aspirin EC 81 MG EC tablet Take 1 tablet (81 mg total) by mouth daily. Swallow whole.   azelastine (ASTELIN) 0.1 % nasal spray Place 1 spray into both nostrils as needed.   bisacodyl (DULCOLAX) 5 MG EC tablet Take 5 mg by mouth daily as needed for moderate constipation.   cloNIDine (CATAPRES) 0.1 MG tablet Take 0.1 mg by mouth 2 (two) times daily.   DULoxetine (CYMBALTA) 60 MG capsule 60 mg every morning.   ELIQUIS 5 MG TABS tablet Take 5 mg by mouth 2 (two) times daily.   esomeprazole (NEXIUM) 40 MG capsule Take  40 mg by mouth daily.   [START ON 08/25/2022] HYDROmorphone (DILAUDID) 4 MG tablet Take 1 tablet (4 mg total) by mouth every 8 (eight) hours as needed. Must last 30 days.   latanoprost (XALATAN) 0.005 % ophthalmic solution Place 1 drop into both eyes at bedtime.   levothyroxine (SYNTHROID) 125 MCG tablet Take 125 mcg by mouth daily before breakfast.   loratadine (CLARITIN) 10 MG tablet Take 10 mg by mouth at bedtime.   losartan (COZAAR) 50 MG tablet Take 1 tablet by mouth every morning.   mexiletine (MEXITIL) 150 MG capsule Take 1 capsule (150 mg total) by mouth 2 (two) times daily.   pregabalin (LYRICA) 75 MG capsule Take 1 capsule (75 mg total) by mouth 3 (three) times daily.   promethazine (PHENERGAN) 25 MG tablet Take 25 mg by mouth every 6 (six) hours as needed for nausea or vomiting.   tiZANidine (ZANAFLEX) 4 MG tablet Take 1 tablet (4 mg total) by mouth every 8 (eight) hours as needed for muscle spasms.     Allergies:   Celebrex [celecoxib], Codeine, and Penicillins   Social History   Socioeconomic History   Marital status: Married    Spouse name: Not on file   Number of children: Not on file   Years of education: Not on file   Highest education level: Not on file  Occupational History   Not on file  Tobacco  Use   Smoking status: Every Day    Packs/day: 0.75    Years: 59.00    Total pack years: 44.25    Types: Cigarettes   Smokeless tobacco: Never  Vaping Use   Vaping Use: Never used  Substance and Sexual Activity   Alcohol use: No   Drug use: No   Sexual activity: Not on file  Other Topics Concern   Not on file  Social History Narrative   Not on file   Social Determinants of Health   Financial Resource Strain: Not on file  Food Insecurity: Not on file  Transportation Needs: Not on file  Physical Activity: Not on file  Stress: Not on file  Social Connections: Not on file     Family History: The patient's family history includes Emphysema in his father and sister; Heart attack in his father; Heart disease in his brother and mother.  ROS:   Please see the history of present illness.    All other systems reviewed and are negative.  EKGs/Labs/Other Studies Reviewed:    The following studies were reviewed today:  April 02, 2022 echo EF 30% Mild mitral regurgitation and tricuspid regurgitation  EKG on October 31, 2021 shows bigeminal PVCs.  PVCs have a steeply inferior axis and are positive throughout the precordium.  April 02, 2022 SPECT shows EF 32%, large inferior apical defect.  No reversible ischemia.  04/18/2022 Holter results Total beats 458,937 Minimum rate 44 maximum 156 average 73 Rare PACs Frequent multiple PVCs Short runs of A-fib nonsustained Short runs of VT longest 9 beats nonsustained Heavy PVC burden of 18% No ST segment changes No pauses No high-grade blocks    Recent Labs: 10/28/2021: B Natriuretic Peptide 135.4 11/01/2021: Hemoglobin 15.4; Magnesium 2.3; Platelets 258 01/30/2022: BUN 10; Creatinine, Ser 0.91; Potassium 5.2; Sodium 145  Recent Lipid Panel    Component Value Date/Time   CHOL 102 10/29/2021 0452   TRIG 64 10/29/2021 0452   HDL 37 (L) 10/29/2021 0452   CHOLHDL 2.8 10/29/2021 0452   VLDL 13 10/29/2021 0452  [place] 52  10/29/2021 0452    Physical Exam:    VS:  BP (!) 160/90   Pulse 68   Ht '6\' 3"'$  (1.905 m)   Wt 210 lb (95.3 kg)   BMI 26.25 kg/m     Wt Readings from Last 3 Encounters:  08/07/22 210 lb (95.3 kg)  07/03/22 210 lb (95.3 kg)  06/17/22 210 lb (95.3 kg)     GEN: Chronically ill-appearing in no distress CARDIAC: RRR, no murmurs, rubs, gallops RESPIRATORY:  Clear to auscultation without rales, wheezing or rhonchi  PSYCHIATRIC:  Normal affect       ASSESSMENT:    1. Chronic systolic heart failure ([place])   2. PVC's (premature ventricular contractions)   3. Chronic hypoxic respiratory failure (HCC)    PLAN:    In order of problems listed above:   #Chronic systolic heart failure NYHA class III.  Warm and dry on exam. Continue current medical therapy including spironolactone and losartan.  He did not tolerate beta-blocker. I do not think he is a candidate for defibrillator therapy at this time given his chronic, advanced comorbidities.  We did discuss ICD therapy briefly during today's visit.  #Frequent PVCs 18% on recent heart monitor. Not eligible for class Ic antiarrhythmics given extensive coronary artery disease history.  Not a good candidate for amiodarone given history of chronic hypoxic respiratory failure.  Recommend a trial of mexiletine 150 mg by mouth twice daily to see if we are able to achieve PVC suppression.  He is not a candidate for EP study and ablation.  #Atrial fibrillation No episodes on recent heart monitor Continue Eliquis for stroke prophylaxis  Follow-up in 6 to 8 weeks.  Consider ZIO monitor at that appointment.      Medication Adjustments/Labs and Tests Ordered: Current medicines are reviewed at length with the patient today.  Concerns regarding medicines are outlined above.  No orders of the defined types were placed in this encounter.  Meds ordered this encounter  Medications   mexiletine (MEXITIL) 150 MG capsule    Sig: Take 1 capsule  (150 mg total) by mouth 2 (two) times daily.    Dispense:  180 capsule    Refill:  3     Signed, Meredyth Hornung T. Quentin Ore, MD, [facility], [facility] 08/07/2022 2:50 PM    Electrophysiology [facility]

## 2022-08-07 NOTE — Patient Instructions (Signed)
Medication Instructions:  Your physician has recommended you make the following change in your medication:  1) START taking mexiletine 150 mg twice daily   *If you need a refill on your cardiac medications before your next appointment, please call your pharmacy*  Follow-Up: At [facility], you and your health needs are our priority.  As part of our continuing mission to provide you with exceptional heart care, we have created designated Provider Care Teams.  These Care Teams include your primary Cardiologist (physician) and Advanced Practice Providers (APPs -  Physician Assistants and Nurse Practitioners) who all work together to provide you with the care you need, when you need it.  Your next appointment:   6-8 week(s)  Provider:   Lars Mage, MD

## 2022-08-14 ENCOUNTER — Telehealth: Payer: Self-pay | Admitting: Cardiology

## 2022-08-14 NOTE — Telephone Encounter (Signed)
  Pt c/o medication issue:  1. Name of Medication:   mexiletine (MEXITIL) 150 MG capsule    2. How are you currently taking this medication (dosage and times per day)?   Take 1 capsule (150 mg total) by mouth 2 (two) times daily.    3. Are you having a reaction (difficulty breathing--STAT)? no  4. What is your medication issue? Pt said, he feels very sick when taking this medication. He felt nauseated, weak and headache.

## 2022-08-14 NOTE — Telephone Encounter (Signed)
Attempted to dial number several times a received a recording stating the call can't be completed as dialed.

## 2022-08-19 NOTE — Telephone Encounter (Signed)
Attempted to call the patient x 2 attempts. The call is picked up and then disconnected. We will attempt to contact the patient at a later time.

## 2022-08-23 NOTE — Telephone Encounter (Signed)
Left message on patient's voicemail asking if he is still experiencing the signs and symptoms he initially called about in regards to the medication he is taking. Asked patient to call back at [phone]

## 2022-09-11 NOTE — Progress Notes (Unsigned)
Patient's wife called this morning wanting to reschedule since [name] was rather "combative" this morning. Message sent to wife to take him to ED if he is disoriented or having any other signs of symptoms of cognitive impairment or anything suggestive of encephalopathy.

## 2022-09-12 ENCOUNTER — Ambulatory Visit (HOSPITAL_BASED_OUTPATIENT_CLINIC_OR_DEPARTMENT_OTHER): Payer: Medicare (Managed Care) | Admitting: Pain Medicine

## 2022-09-12 DIAGNOSIS — M961 Postlaminectomy syndrome, not elsewhere classified: Secondary | ICD-10-CM

## 2022-09-12 DIAGNOSIS — Z79899 Other long term (current) drug therapy: Secondary | ICD-10-CM

## 2022-09-12 DIAGNOSIS — G8929 Other chronic pain: Secondary | ICD-10-CM

## 2022-09-12 DIAGNOSIS — Z79891 Long term (current) use of opiate analgesic: Secondary | ICD-10-CM

## 2022-09-12 DIAGNOSIS — Z91199 Patient's noncompliance with other medical treatment and regimen due to unspecified reason: Secondary | ICD-10-CM

## 2022-09-12 DIAGNOSIS — G894 Chronic pain syndrome: Secondary | ICD-10-CM

## 2022-09-12 DIAGNOSIS — M47817 Spondylosis without myelopathy or radiculopathy, lumbosacral region: Secondary | ICD-10-CM

## 2022-09-12 DIAGNOSIS — M4316 Spondylolisthesis, lumbar region: Secondary | ICD-10-CM

## 2022-09-18 ENCOUNTER — Encounter: Payer: Self-pay | Admitting: Cardiology

## 2022-09-18 ENCOUNTER — Ambulatory Visit: Payer: Medicare (Managed Care) | Attending: Cardiology | Admitting: Cardiology

## 2022-09-18 VITALS — BP 154/98 | HR 91 | Ht 75.0 in | Wt 203.8 lb

## 2022-09-18 DIAGNOSIS — I5022 Chronic systolic (congestive) heart failure: Secondary | ICD-10-CM | POA: Diagnosis not present

## 2022-09-18 DIAGNOSIS — I493 Ventricular premature depolarization: Secondary | ICD-10-CM

## 2022-09-18 MED ORDER — ENTRESTO 49-51 MG PO TABS: 1.0000 | ORAL_TABLET | Freq: Two times a day (BID) | ORAL | 11 refills | Status: DC

## 2022-09-18 NOTE — Patient Instructions (Addendum)
Medication Instructions:  Your physician has recommended you make the following change in your medication:  1) STOP taking losartan 2) START taking Entresto 49-51 mg twice daily  *If you need a refill on your cardiac medications before your next appointment, please call your pharmacy*  Lab Work: BMET in two weeks You will get your lab work at [facility] hospital.  Your lab work will be done at [facility] next to [employer]. These are walk in labs- you will not need an appointment and you do not need to be fasting.    Follow-Up: At [facility], you and your health needs are our priority.  As part of our continuing mission to provide you with exceptional heart care, we have created designated Provider Care Teams.  These Care Teams include your primary Cardiologist (physician) and Advanced Practice Providers (APPs -  Physician Assistants and Nurse Practitioners) who all work together to provide you with the care you need, when you need it.  Your next appointment:   6 month(s)  Provider:   You may see Vickie Epley, MD or one of the following Advanced Practice Providers on your designated Care Team:   Tommye Standard, [place] Legrand Como "Jonni Sanger" [place], [place] Mamie Levers, NP    Other Instructions You have been referred to see PharmD for titration of your heart failure medication in 1 month.  You have been referred to Dr. Lucky Cowboy

## 2022-09-18 NOTE — Progress Notes (Signed)
  Electrophysiology Office Follow up Visit Note:    Date:  09/18/2022   ID:  [name], DOB [birth year], MRN [id number]  PCP:  Adin Hector, MD  [place] Cardiologist:  None  [facility] Electrophysiologist:  Vickie Epley, MD    Interval History:    [name] is a 77 y.o. male who presents for a follow up visit.   Last seen August 07, 2022 for atrial fibrillation and PVCs.  Mexiletine was started at the last appointment in an effort to suppress his frequent PVCs (18% on recent heart monitor).  He is chronically ill with multiple comorbidities and is not a candidate for invasive EP procedures.  He reports continuous fatigue.  No syncope or presyncope.     Past medical, surgical, social and family history were reviewed.  ROS:   Please see the history of present illness.    All other systems reviewed and are negative.  EKGs/Labs/Other Studies Reviewed:    The following studies were reviewed today:    EKG:  The ekg ordered today demonstrates sinus rhythm.  Single PVC.   Physical Exam:    VS:  BP (!) 154/98   Pulse 91   Ht 6' 3"$  (1.905 m)   Wt 203 lb 12.8 oz (92.4 kg)   SpO2 97%   BMI 25.47 kg/m     Wt Readings from Last 3 Encounters:  09/18/22 203 lb 12.8 oz (92.4 kg)  08/07/22 210 lb (95.3 kg)  07/03/22 210 lb (95.3 kg)     GEN:  Well nourished, well developed in no acute distress CARDIAC: RRR, no murmurs, rubs, gallops RESPIRATORY:  Clear to auscultation without rales, wheezing or rhonchi       ASSESSMENT:    1. Chronic systolic heart failure ([place])   2. PVC's (premature ventricular contractions)    PLAN:    In order of problems listed above:   #Chronic systolic heart failure NYHA class III.  Warm and dry on exam. Last EF 30% in September 2023 On losartan and spironolactone.  Did not tolerate beta-blocker. Transition from losartan to [facility].  Will have pharmacy assist.  Will get blood work in 2 weeks and have him  follow-up with the pharmacist in 4 weeks for further up titration.  #Frequent PVCs Improved on mexiletine.    Follow-up in 6 months for antiarrhythmic monitoring.  Plan for repeat echo at that time.    Signed, Lars Mage, MD, [facility], [facility] 09/18/2022 3:23 PM    Electrophysiology  Medical Group Heart[facility]

## 2022-09-19 NOTE — Progress Notes (Signed)
PROVIDER NOTE: Information contained herein reflects review and annotations entered in association with encounter. Interpretation of such information and data should be left to medically-trained personnel. Information provided to patient can be located elsewhere in the medical record under "Patient Instructions". Document created using STT-dictation technology, any transcriptional errors that may result from process are unintentional.    Patient: [name]  Service Category: E/M  Provider: [name], MD  DOB: 08/08/1945  DOS: 09/23/2022  Referring Provider: Adin Hector, MD  MRN: [id number]  Specialty: Interventional Pain Management  PCP: Adin Hector, MD  Type: Established Patient  Setting: Ambulatory outpatient    Location: Office  Delivery: Face-to-face     HPI  [name], a 77 y.o. year old male, is here today because of his Acute exacerbation of chronic low back pain [M54.50, G89.29]. Mr. Grun primary complain today is Back Pain (lower) Last encounter: My last encounter with him was on 09/12/2022. Pertinent problems: Mr. 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[place]); Chronic ankle pain (Bilateral); Pain in joint, ankle and foot; Polyneuropathy associated with underlying disease ([place]); Presence of intrathecal pump; Ankle sprain; Rotator cuff sprain; Lumbosacral radiculopathy at S1; Chronic feet pain (3ry area of Pain) (Bilateral) (R>L); Chronic knee pain (4th area of Pain) (Bilateral) (R>L); Failed back surgical syndrome; Lower extremity weakness (Bilateral); Chronic neuropathic pain; Peripheral neurogenic pain; Nonfamilial nocturnal leg cramps (Bilateral); Restless leg syndrome; Pain in right knee; DDD  (degenerative disc disease), lumbosacral; Abnormal MRI, lumbar spine (02/12/2022); 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; Lumbar facet arthropathy (Multilevel) (Bilateral); Lumbosacral facet syndrome (Bilateral); Lumbar foraminal narrowing (Multilevel) (Bilateral); Cervicalgia; Chronic shoulder pain (Bilateral) (R>L); Radicular pain of shoulder; Occipital neuralgia (Right); Cervico-occipital neuralgia (Right); Cervical facet hypertrophy (Multilevel) (Bilateral); Cervical facet pain; Painful cervical range of motion; Chronic neurogenic pain; Left rib fracture; Chronic pain; Foot pain; Lumbago; 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 Pain Assessment: Severity of Chronic pain is reported as a 9 /10. Location: Back Left, Right/pain radiaties down his left leg to his feet. Onset: More than a month ago. Quality: Aching, Burning, Constant, Throbbing, Stabbing, Pressure. Timing: Constant. Modifying factor(s): nothing. Vitals:  height is '6\' 3"'$  (1.905 m) and weight is 204 lb (92.5 kg). His temporal temperature is 98.7 F (37.1 C). His blood pressure is 138/84 and his pulse is 82. His respiration is 19 and oxygen saturation is 95%.  BMI: Estimated body mass index is 25.5 kg/m as calculated from the following:   Height as of this encounter: '6\' 3"'$  (1.905 m).   Weight as of this encounter: 204 lb (92.5 kg).  Reason for encounter: evaluation of worsening, or previously known (established) problem.  The patient indicates having a flareup of his low back pain and lower extremity pain going down the left lower extremity on the way down to the top of  the foot and the lateral aspect of the foot and what appears to be an L5/S1 dermatomal distribution on the left side.  He  wants to come in for an epidural steroid injection.  He was recently started on Eliquis and we need to get clearance from the prescribing physician before we stop it.  As soon as we get that, then we will proceed with the procedure.  Today we have provided the patient with an IM injection of Toradol 60 mg and Robaxin 200 mg to help break the pain cycle.  He refers that he has not been able to sleep due to the pain.  The last time that the patient had a caudal epidural steroid injection was on 04/24/2021.  Pharmacotherapy Assessment  Analgesic: Hydromorphone 4 mg tablet, 1 tab p.o. 3 times daily MME/day: 60 mg/day    Monitoring: [place] PMP: PDMP reviewed during this encounter.       Pharmacotherapy: No side-effects or adverse reactions reported. Compliance: No problems identified. Effectiveness: Clinically acceptable.  No notes on file  No results found for: "CBDTHCR" No results found for: "D8THCCBX" No results found for: "D9THCCBX"  UDS:  Summary  Date Value Ref Range Status  09/19/2021 Note  Final    Comment:    ==================================================================== [employer] [facility] (MW) ==================================================================== Test                             Result       Flag       Units  Drug Present and Declared for Prescription Verification   Hydromorphone                  1862         EXPECTED   ng/mg creat    Hydromorphone may be administered as a scheduled prescription    medication; it is also an expected metabolite of hydrocodone.  ==================================================================== Test                      Result    Flag   Units      Ref Range   Creatinine              26               mg/dL      >=20 ==================================================================== Declared Medications:   The flagging and interpretation on this report are based on the  following declared medications.  Unexpected results may arise from  inaccuracies in the declared medications.   **Note: The testing scope of this panel includes these medications:   Hydromorphone (Dilaudid)   **Note: The testing scope of this panel does not include the  following reported medications:   Albuterol (Proair HFA)  Amitriptyline (Elavil)  Azelastine  Bisacodyl  Clonidine (Catapres)  Duloxetine (Cymbalta)  Eye Drop  Levothyroxine (Synthroid)  Losartan (Cozaar)  Multivitamin  Naloxone (Narcan)  Olodaterol  Pregabalin (Lyrica)  Promethazine (Phenergan)  Simvastatin (Zocor)  Tiotropium ==================================================================== For clinical consultation, please call [phone]. ====================================================================       ROS  Constitutional: Denies any fever or chills Gastrointestinal: No reported hemesis, hematochezia, vomiting, or acute GI distress Musculoskeletal: Denies any acute onset joint swelling, redness, loss of ROM, or weakness Neurological: No reported episodes of acute onset apraxia, aphasia, dysarthria, agnosia, amnesia, paralysis, loss of coordination, or loss of consciousness  Medication Review  DULoxetine, HYDROmorphone, albuterol, amitriptyline, apixaban, aspirin EC, azelastine, bisacodyl, cloNIDine, esomeprazole, latanoprost, levothyroxine, loratadine, mexiletine, naloxone, pregabalin, promethazine, sacubitril-valsartan, simvastatin, and tiZANidine  History Review  Allergy: Mr. Granados is allergic to celebrex [celecoxib], codeine, and  penicillins. Drug: Mr. Bellmore  reports no history of drug use. Alcohol:  reports no history of alcohol use. Tobacco:  reports that he has been smoking cigarettes. He has a 44.25 pack-year smoking history. He has never used smokeless tobacco. Social: Mr. Dietl  reports that he has been smoking  cigarettes. He has a 44.25 pack-year smoking history. He has never used smokeless tobacco. He reports that he does not drink alcohol and does not use drugs. Medical:  has a past medical history of Acute anterolateral wall MI ([place]) (2000), Acute non-Q wave non-ST elevation myocardial infarction (NSTEMI) ([place]) (12/03/2004), Adenomatous polyp of colon, Agent orange exposure, Angina pectoris ([place]), Anxiety, Aortic atherosclerosis ([place]), Asthma, Bladder stones, Bradycardia, CHF (congestive heart failure) ([place]), Chronic lower back pain, Chronic pain syndrome, COPD (chronic obstructive pulmonary disease) ([place]), Coronary artery disease, Depression, DJD (degenerative joint disease), DVT of lower extremity, bilateral ([place]), Dyspnea, GERD (gastroesophageal reflux disease), History of hiatal hernia, HLD (hyperlipidemia), Hypertension, Hypothyroidism, Inflammatory polyarthropathy ([place]), Internal hemorrhoids, Long term prescription opiate use, 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 Surgical: Mr. Diponio  has a past surgical history that includes Coronary angioplasty; Posterior lumbar fusion (N/A, 1999); Nissen fundoplication (N/A, [id number]); Mastoidectomy; Partial colectomy (N/A); Intrathecal pump implantation (N/A, 03/2008); Intrathecal pump revision (N/A, 09/28/2014); Transurethral resection of bladder tumor (N/A, 03/07/2015); Colonoscopy with propofol (N/A, 02/27/2018); External ear surgery; Skin cancer excision; IVC FILTER PLACEMENT ([facility] HX) (N/A,  09/2007); IVC FILTER REMOVAL (N/A, 2011); Colonoscopy (N/A, 11/22/2013); Colonoscopy (N/A, 09/19/2008); Colonoscopy (N/A, 08/25/2003); Colonoscopy (N/A, 10/15/1994); Posterior laminectomy / decompression cervical spine (N/A, 1984); Posterior laminectomy / decompression cervical spine (N/A, 1985); Tonsillectomy (Bilateral, 1958); Coronary angioplasty with stent (Left, 12/04/2004); LEFT HEART CATH AND CORONARY ANGIOGRAPHY (Left, 02/19/2008); LEFT HEART CATH AND CORONARY ANGIOGRAPHY (Left, 04/18/1987); LEFT HEART CATH AND CORONARY ANGIOGRAPHY (Left, 02/03/2002); and Pain pump removal (N/A, 12/20/2021). Family: family history includes Emphysema in his father and sister; Heart attack in his father; Heart disease in his brother and mother.  Laboratory Chemistry Profile   Renal Lab Results  Component Value Date   BUN 10 01/30/2022   CREATININE 0.91 01/30/2022   BCR 11 01/30/2022   GFRAA >60 03/02/2015   GFRNONAA >60 11/01/2021    Hepatic Lab Results  Component Value Date   AST 31 01/30/2022   ALBUMIN 4.0 01/30/2022   ALKPHOS 96 01/30/2022    Electrolytes Lab Results  Component Value Date   NA 145 (H) 01/30/2022   K 5.2 01/30/2022   CL 104 01/30/2022   CALCIUM 9.6 01/30/2022   MG 2.3 11/01/2021   PHOS 3.7 11/01/2021    Bone Lab Results  Component Value Date   25OHVITD1 22 (L) 02/07/2021   25OHVITD2 <1.0 02/07/2021   25OHVITD3 22 02/07/2021    Inflammation (CRP: Acute Phase) (ESR: Chronic Phase) Lab Results  Component Value Date   CRP 5 01/30/2022   ESRSEDRATE 11 01/30/2022   LATICACIDVEN 0.9 10/28/2021         Note: Above Lab results reviewed.  Recent Imaging Review  MR LUMBAR SPINE WO CONTRAST CLINICAL DATA:  Low back pain and bilateral leg pain for 3 weeks. History of surgery in 1999.  EXAM: MRI LUMBAR  SPINE WITHOUT CONTRAST  TECHNIQUE: Multiplanar, multisequence MR imaging of the lumbar spine was performed. No intravenous contrast was administered.  COMPARISON:  CT  abdomen/pelvis 01/25/2022, lumbar spine MRI 10/21/2005  FINDINGS: Segmentation: Standard; the lowest formed disc space is designated L5-S1  Alignment: There is grade 1 anterolisthesis of L4 on L5, unchanged since the recent prior CT abdomen/pelvis but new since 2007.  Vertebrae: Vertebral body heights are preserved. Background marrow signal is normal. There is no suspicious marrow signal abnormality or marrow edema.  A focus of T1 and T2 hyperintensity in the right iliac bone is unchanged since 2007, benign. A similar appearing lesion in the left iliac bone is also likely benign.  Conus medullaris and cauda equina: Conus extends to the L1 level. Conus and cauda equina appear normal.  Paraspinal and other soft tissues: Unremarkable.  Disc levels:  T12-L1: No significant spinal canal or neural foraminal stenosis  L1-L2: There is a shallow central protrusion and mild facet arthropathy without significant spinal canal or neural foraminal stenosis.  L2-L3: There is a disc bulge eccentric to the left and mild bilateral facet arthropathy resulting in narrowing of the left subarticular zone with potential mass effect on the traversing left L3 nerve root (8-17, 5-11), and no significant neural foraminal stenosis  L3-L4: There is a diffuse disc bulge with a prominent left foraminal/extraforaminal protrusion/extrusion and bilateral facet arthropathy with ligamentum flavum thickening resulting in moderate spinal canal stenosis with bilateral subarticular zone narrowing, left worse than right, without evidence of frank impingement and moderate left and mild right neural foraminal stenosis with contact and probable displacement of the exiting left L3 nerve root and possible contact of the exiting right L3 nerve root (5-14, 5-4).  L4-L5: There is grade 1 anterolisthesis with uncovering of the disc posteriorly. Suspect decompressive laminectomies at this level. There is a mild disc  bulge and moderate facet arthropathy resulting in moderate spinal canal stenosis with left worse than right subarticular zone narrowing but no evidence of frank impingement and moderate left and mild right neural foraminal stenosis  L5-S1: Status post decompressive laminectomies. There is bilateral facet arthropathy without significant spinal canal or neural foraminal stenosis.  IMPRESSION: 1. L2-L3: Disc bulge eccentric to the left resulting in narrowing of the left subarticular zone with possible impingement of the traversing left L3 nerve root. 2. L3-L4: Disc bulge with a prominent left foraminal/extraforaminal protrusion and bilateral facet arthropathy resulting in moderate spinal canal stenosis with left worse than right subarticular zone narrowing and moderate left and mild right neural foraminal stenosis with contact and possible displacement of either or both exiting L3 nerve roots, left worse than right. 3. L4-L5: Suspect decompressive laminectomies. Grade 1 anterolisthesis and bulky facet arthropathy results in moderate spinal canal stenosis with left worse than right subarticular zone narrowing and moderate left neural foraminal stenosis. 4. L5-S1: Decompressive laminectomies with bilateral facet arthropathy no significant spinal canal or neural foraminal stenosis.  Electronically Signed   By: Valetta Mole M.D.   On: 02/12/2022 12:14 Note: Reviewed        Physical Exam  General appearance: Well nourished, well developed, and well hydrated. In no apparent acute distress Mental status: Alert, oriented x 3 (person, place, & time)       Respiratory: No evidence of acute respiratory distress Eyes: PERLA Vitals: BP 138/84 (BP Location: Left Arm, Patient Position: Sitting, Cuff Size: Normal)   Pulse 82   Temp 98.7 F (37.1 C) (Temporal)   Resp 19   Ht  $'6\' 3"'D$  (1.905 m)   Wt 204 lb (92.5 kg)   SpO2 95%   BMI 25.50 kg/m  BMI: Estimated body mass index is 25.5 kg/m as  calculated from the following:   Height as of this encounter: '6\' 3"'$  (1.905 m).   Weight as of this encounter: 204 lb (92.5 kg). Ideal: Ideal body weight: 84.5 kg (186 lb 4.6 oz) Adjusted ideal body weight: 87.7 kg (193 lb 6 oz)  Assessment   Diagnosis Status  1. Acute exacerbation of chronic low back pain   2. Chronic low back pain (Bilateral) w/ sciatica (Left)   3. Chronic lower extremity pain (Left)   4. Lumbosacral radiculopathy at L5   5. Lumbosacral radiculopathy at S1   6. 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 DDD (degenerative disc disease), lumbosacral   8. Lumbar post-laminectomy syndrome   9. Failed back surgical syndrome   10. Lumbar facet arthropathy (Multilevel) (Bilateral)   11. Chronic anticoagulation (Eliquis)    Having a Flare-up Worsened Recurring   Updated Problems: Problem  Chronic anticoagulation (Eliquis)   Anticoagulation: Eliquis (Stop: 3 days  Restart: 6 hrs)    Chronic low back pain (Bilateral) w/ sciatica (Left)  Chronic lower extremity pain (Left)  Lumbosacral Radiculopathy At L5  Lumbosacral Radiculopathy At S1    Plan of Care  Problem-specific:  No problem-specific Assessment & Plan notes found for this encounter.  Mr. KABE ARTHER has a current medication list which includes the following long-term medication(s): albuterol, amitriptyline, azelastine, duloxetine, eliquis, esomeprazole, hydromorphone, [START ON 09/24/2022] hydromorphone, levothyroxine, loratadine, mexiletine, pregabalin, and simvastatin.  Pharmacotherapy (Medications Ordered): Meds ordered this encounter  Medications   ketorolac (TORADOL) injection 60 mg   methocarbamol (ROBAXIN) injection 200 mg   Orders:  Orders Placed This Encounter  Procedures   Informed Consent Details: Physician/Practitioner Attestation; Transcribe to consent form and obtain patient signature    Do not administer NSAIDs (Toradol, etc.) if patient has an allergy or intolerance to  NSAIDs or if patient has CKD (chronic kidney disease or failure). Avoid the use of Muscle Relaxants (orphenedrine/Norflex, etc.) if patient has allergy or intolerance to this medication.    Scheduling Instructions:     Nursing orders: Complete pain questionnaire before administration of therapy. Document location, laterality, onset, level, trigger, and current medications taken for the pain.    Order Specific Question:   Physician/Practitioner attestation of informed consent for procedure/surgical case    Answer:   I, the physician/practitioner, attest that I have discussed with the patient the benefits, risks, side effects, alternatives, likelihood of achieving goals and potential problems during recovery for the procedure that I have provided informed consent.    Order Specific Question:   Procedure    Answer:   IM injection of therapeutic substance    Order Specific Question:   Physician/Practitioner performing the procedure    Answer:   Brenetta Penny A. Dossie Arbour, MD    Order Specific Question:   Indication/Reason    Answer:   Acute on chronic pain   Blood Thinner Instructions to Nursing    If unable to stop, ask if Lovenox-bridge therapy may be possible, and if so, request their assistance in implementing it.    Scheduling Instructions:     Contact the physician prescribing the blood thinner and request clearance to stop it for time period stipulated below.     If approved by prescribing physician, stop Eliquis (Apixaban) x 3 days prior to procedure or surgery.  Follow-up plan:   Return for [facility]): (L) Caudal ESI #2, (Blood Thinner Protocol).      Interventional Therapies  Risk Factors  Complex Considerations:  Anticoagulation: Eliquis (Stop: 3 days  Restart: 6 hrs)  T2IDDM  CAD  CHF  COPD  Hx. Nephrolithiasis  A-Fib  HTN  IBS  Hx. NSTEMI MI  Hx DVT  Depression/Anxiety    Planned  Pending:   Therapeutic left caudal ESI #2    Under consideration:   Diagnostic/therapeutic  bilateral lumbar facet MBB #1  Therapeutic/palliative left L4-5 LESI #1    Completed:   Diagnostic intrathecal pump catheter test/evaluation x1 (06/21/2021) (Dx: Catheter malfunction/dysfunction)  Intrathecal catheter and pump removal by me (12/20/2021) (2ry to end of battery life & catheter malfunction)  Diagnostic left caudal ESI and epidurogram x1 (04/24/2021) (LBP: 50/50/0  LEP; 100/100/50-60)    Completed by other providers:   Referral (02/07/2021 & 09/19/2021) to [facility] neurosurgery Deetta Perla, MD) for ITP replacement.  Cardiology clearance to Dr. Clayborn Bigness requested (09/19/2021)    Therapeutic  Palliative (PRN) options:   None established   Pharmacotherapy  Nonopioids transferred (03/20/2022): Pregabalin (Lyrica) 75 mg TID.       Recent Visits Date Type Provider Dept  07/03/22 Office Visit Milinda Pointer, MD [facility]  Showing recent visits within past 90 days and meeting all other requirements Today's Visits Date Type Provider Dept  09/23/22 Office Visit Milinda Pointer, MD [facility]  Showing today's visits and meeting all other requirements Future Appointments Date Type Provider Dept  10/14/22 Appointment Milinda Pointer, MD [facility]  Showing future appointments within next 90 days and meeting all other requirements  I discussed the assessment and treatment plan with the patient. The patient was provided an opportunity to ask questions and all were answered. The patient agreed with the plan and demonstrated an understanding of the instructions.  Patient advised to call back or seek an in-person evaluation if the symptoms or condition worsens.  Duration of encounter: 30 minutes.  Total time on encounter, as per AMA guidelines included both the face-to-face and non-face-to-face time personally spent by the physician and/or other qualified health care professional(s) on the day of the encounter (includes time in activities that  require the physician or other qualified health care professional and does not include time in activities normally performed by clinical staff). Physician's time may include the following activities when performed: Preparing to see the patient (e.g., pre-charting review of records, searching for previously ordered imaging, lab work, and nerve conduction tests) Review of prior analgesic pharmacotherapies. Reviewing PMP Interpreting ordered tests (e.g., lab work, imaging, nerve conduction tests) Performing post-procedure evaluations, including interpretation of diagnostic procedures Obtaining and/or reviewing separately obtained history Performing a medically appropriate examination and/or evaluation Counseling and educating the patient/family/caregiver Ordering medications, tests, or procedures Referring and communicating with other health care professionals (when not separately reported) Documenting clinical information in the electronic or other health record Independently interpreting results (not separately reported) and communicating results to the patient/ family/caregiver Care coordination (not separately reported)  Note by: [name], MD Date: 09/23/2022; Time: 3:32 PM

## 2022-09-23 ENCOUNTER — Telehealth: Payer: Self-pay | Admitting: Pain Medicine

## 2022-09-23 ENCOUNTER — Encounter: Payer: Self-pay | Admitting: Pain Medicine

## 2022-09-23 ENCOUNTER — Ambulatory Visit: Payer: Medicare (Managed Care) | Attending: Pain Medicine | Admitting: Pain Medicine

## 2022-09-23 VITALS — BP 138/84 | HR 82 | Temp 98.7°F | Resp 19 | Ht 75.0 in | Wt 204.0 lb

## 2022-09-23 DIAGNOSIS — M5442 Lumbago with sciatica, left side: Secondary | ICD-10-CM | POA: Diagnosis present

## 2022-09-23 DIAGNOSIS — M79604 Pain in right leg: Secondary | ICD-10-CM | POA: Insufficient documentation

## 2022-09-23 DIAGNOSIS — M961 Postlaminectomy syndrome, not elsewhere classified: Secondary | ICD-10-CM | POA: Insufficient documentation

## 2022-09-23 DIAGNOSIS — M47816 Spondylosis without myelopathy or radiculopathy, lumbar region: Secondary | ICD-10-CM | POA: Diagnosis present

## 2022-09-23 DIAGNOSIS — G8929 Other chronic pain: Secondary | ICD-10-CM | POA: Insufficient documentation

## 2022-09-23 DIAGNOSIS — M545 Low back pain, unspecified: Secondary | ICD-10-CM | POA: Diagnosis not present

## 2022-09-23 DIAGNOSIS — M79605 Pain in left leg: Secondary | ICD-10-CM | POA: Diagnosis present

## 2022-09-23 DIAGNOSIS — M5441 Lumbago with sciatica, right side: Secondary | ICD-10-CM | POA: Diagnosis present

## 2022-09-23 DIAGNOSIS — Z7901 Long term (current) use of anticoagulants: Secondary | ICD-10-CM | POA: Diagnosis present

## 2022-09-23 DIAGNOSIS — M5417 Radiculopathy, lumbosacral region: Secondary | ICD-10-CM | POA: Insufficient documentation

## 2022-09-23 DIAGNOSIS — M4316 Spondylolisthesis, lumbar region: Secondary | ICD-10-CM | POA: Diagnosis present

## 2022-09-23 DIAGNOSIS — M5137 Other intervertebral disc degeneration, lumbosacral region: Secondary | ICD-10-CM | POA: Insufficient documentation

## 2022-09-23 MED ORDER — KETOROLAC TROMETHAMINE 60 MG/2ML IM SOLN
INTRAMUSCULAR | Status: AC
  Filled 2022-09-23: qty 2

## 2022-09-23 MED ORDER — METHOCARBAMOL 1000 MG/10ML IJ SOLN
200.0000 mg | Freq: Once | INTRAMUSCULAR | Status: AC
  Administered 2022-09-23: 200 mg via INTRAMUSCULAR

## 2022-09-23 MED ORDER — KETOROLAC TROMETHAMINE 60 MG/2ML IM SOLN
60.0000 mg | Freq: Once | INTRAMUSCULAR | Status: AC
  Administered 2022-09-23: 60 mg via INTRAMUSCULAR

## 2022-09-23 MED ORDER — METHOCARBAMOL 1000 MG/10ML IJ SOLN
INTRAMUSCULAR | Status: AC
  Filled 2022-09-23: qty 10

## 2022-09-23 NOTE — Telephone Encounter (Signed)
Mr Blumenstock would like to get a referral to Dr Izora Ribas, [facility] for Cervical Spine Surgery Please ask Dr Dossie Arbour to put in referral request. Thank you

## 2022-09-23 NOTE — Patient Instructions (Signed)
____________________________________________________________________________________________  Blood Thinners  IMPORTANT NOTICE:  If you take any of these, make sure to notify the nursing staff.  Failure to do so may result in injury.  Recommended time intervals to stop and restart blood-thinners, before & after invasive procedures  Generic Name Brand Name Pre-procedure. Stop this long before procedure. Post-procedure. Minimum waiting period before restarting.  Abciximab Reopro 15 days 2 hrs  Alteplase Activase 10 days 10 days  Anagrelide Agrylin    Apixaban Eliquis 3 days 6 hrs  Cilostazol Pletal 3 days 5 hrs  Clopidogrel Plavix 7-10 days 2 hrs  Dabigatran Pradaxa 5 days 6 hrs  Dalteparin Fragmin 24 hours 4 hrs  Dipyridamole Aggrenox 11days 2 hrs  Edoxaban Lixiana; Savaysa 3 days 2 hrs  Enoxaparin  Lovenox 24 hours 4 hrs  Eptifibatide Integrillin 8 hours 2 hrs  Fondaparinux  Arixtra 72 hours 12 hrs  Hydroxychloroquine Plaquenil 11 days   Prasugrel Effient 7-10 days 6 hrs  Reteplase Retavase 10 days 10 days  Rivaroxaban Xarelto 3 days 6 hrs  Ticagrelor Brilinta 5-7 days 6 hrs  Ticlopidine Ticlid 10-14 days 2 hrs  Tinzaparin Innohep 24 hours 4 hrs  Tirofiban Aggrastat 8 hours 2 hrs  Warfarin Coumadin 5 days 2 hrs   Other medications with blood-thinning effects  Product indications Generic (Brand) names Note  Cholesterol Lipitor Stop 4 days before procedure  Blood thinner (injectable) Heparin (LMW or LMWH Heparin) Stop 24 hours before procedure  Cancer Ibrutinib (Imbruvica) Stop 7 days before procedure  Malaria/Rheumatoid Hydroxychloroquine (Plaquenil) Stop 11 days before procedure  Thrombolytics  10 days before or after procedures   Over-the-counter (OTC) Products with blood-thinning effects  Product Common names Stop Time  Aspirin > 325 mg Goody Powders, Excedrin, etc. 11 days  Aspirin ? 81 mg  7 days  Fish oil  4 days  Garlic supplements  7 days  Ginkgo biloba  36  hours  Ginseng  24 hours  NSAIDs Ibuprofen, Naprosyn, etc. 3 days  Vitamin E  4 days   ____________________________________________________________________________________________    ______________________________________________________________________  Procedure instructions  Do not eat or drink fluids (other than water) for 6 hours before your procedure  No water for 2 hours before your procedure  Take your blood pressure medicine with a sip of water  Arrive 30 minutes before your appointment  Carefully read the "Preparing for your procedure" detailed instructions  If you have questions call us at [phone]  _____________________________________________________________________    ______________________________________________________________________  Preparing for your procedure  Appointments: If you think you may not be able to keep your appointment, call 24-48 hours in advance to cancel. We need time to make it available to others.  During your procedure appointment there will be: No Prescription Refills. No disability issues to discussed. No medication changes or discussions.  Instructions: Food intake: Avoid eating anything solid for at least 8 hours prior to your procedure. Clear liquid intake: You may take clear liquids such as water up to 2 hours prior to your procedure. (No carbonated drinks. No soda.) Transportation: Unless otherwise stated by your physician, bring a driver. Morning Medicines: Except for blood thinners, take all of your other morning medications with a sip of water. Make sure to take your heart and blood pressure medicines. If your blood pressure's lower number is above 100, the case will be rescheduled. Blood thinners: Make sure to stop your blood thinners as instructed.  If you take a blood thinner, but were not instructed to stop it,  call our office [phone] and ask to talk to a nurse. Not stopping a blood thinner prior to certain  procedures could lead to serious complications. Diabetics on insulin: Notify the staff so that you can be scheduled 1st case in the morning. If your diabetes requires high dose insulin, take only  of your normal insulin dose the morning of the procedure and notify the staff that you have done so. Preventing infections: Shower with an antibacterial soap the morning of your procedure.  Build-up your immune system: Take 1000 mg of Vitamin C with every meal (3 times a day) the day prior to your procedure. Antibiotics: Inform the nursing staff if you are taking any antibiotics or if you have any conditions that may require antibiotics prior to procedures. (Example: recent joint implants)   Pregnancy: If you are pregnant make sure to notify the nursing staff. Not doing so may result in injury to the fetus, including death.  Sickness: If you have a cold, fever, or any active infections, call and cancel or reschedule your procedure. Receiving steroids while having an infection may result in complications. Arrival: You must be in the facility at least 30 minutes prior to your scheduled procedure. Tardiness: Your scheduled time is also the cutoff time. If you do not arrive at least 15 minutes prior to your procedure, you will be rescheduled.  Children: Do not bring any children with you. Make arrangements to keep them home. Dress appropriately: There is always a possibility that your clothing may get soiled. Avoid long dresses. Valuables: Do not bring any jewelry or valuables.  Reasons to call and reschedule or cancel your procedure: (Following these recommendations will minimize the risk of a serious complication.) Surgeries: Avoid having procedures within 2 weeks of any surgery. (Avoid for 2 weeks before or after any surgery). Flu Shots: Avoid having procedures within 2 weeks of a flu shots or . (Avoid for 2 weeks before or after immunizations). Barium: Avoid having a procedure within 7-10 days after having  had a radiological study involving the use of radiological contrast. (Myelograms, Barium swallow or enema study). Heart attacks: Avoid any elective procedures or surgeries for the initial 6 months after a "Myocardial Infarction" (Heart Attack). Blood thinners: It is imperative that you stop these medications before procedures. Let us know if you if you take any blood thinner.  Infection: Avoid procedures during or within two weeks of an infection (including chest colds or gastrointestinal problems). Symptoms associated with infections include: Localized redness, fever, chills, night sweats or profuse sweating, burning sensation when voiding, cough, congestion, stuffiness, runny nose, sore throat, diarrhea, nausea, vomiting, cold or Flu symptoms, recent or current infections. It is specially important if the infection is over the area that we intend to treat. Heart and lung problems: Symptoms that may suggest an active cardiopulmonary problem include: cough, chest pain, breathing difficulties or shortness of breath, dizziness, ankle swelling, uncontrolled high or unusually low blood pressure, and/or palpitations. If you are experiencing any of these symptoms, cancel your procedure and contact your primary care physician for an evaluation.  Remember:  Regular Business hours are:  Monday to Thursday 8:00 AM to 4:00 PM  Provider's Schedule: Milinda Pointer, MD:  Procedure days: Tuesday and Thursday 7:30 AM to 4:00 PM  Gillis Santa, MD:  Procedure days: Monday and Wednesday 7:30 AM to 4:00 PM  ______________________________________________________________________    ____________________________________________________________________________________________  General Risks and Possible Complications  Patient Responsibilities: It is important that you read this as it  is part of your informed consent. It is our duty to inform you of the risks and possible complications associated with treatments  offered to you. It is your responsibility as a patient to read this and to ask questions about anything that is not clear or that you believe was not covered in this document.  Patient's Rights: You have the right to refuse treatment. You also have the right to change your mind, even after initially having agreed to have the treatment done. However, under this last option, if you wait until the last second to change your mind, you may be charged for the materials used up to that point.  Introduction: Medicine is not an Chief Strategy Officer. Everything in Medicine, including the lack of treatment(s), carries the potential for danger, harm, or loss (which is by definition: Risk). In Medicine, a complication is a secondary problem, condition, or disease that can aggravate an already existing one. All treatments carry the risk of possible complications. The fact that a side effects or complications occurs, does not imply that the treatment was conducted incorrectly. It must be clearly understood that these can happen even when everything is done following the highest safety standards.  No treatment: You can choose not to proceed with the proposed treatment alternative. The "PRO(s)" would include: avoiding the risk of complications associated with the therapy. The "CON(s)" would include: not getting any of the treatment benefits. These benefits fall under one of three categories: diagnostic; therapeutic; and/or palliative. Diagnostic benefits include: getting information which can ultimately lead to improvement of the disease or symptom(s). Therapeutic benefits are those associated with the successful treatment of the disease. Finally, palliative benefits are those related to the decrease of the primary symptoms, without necessarily curing the condition (example: decreasing the pain from a flare-up of a chronic condition, such as incurable terminal cancer).  General Risks and Complications: These are associated to most  interventional treatments. They can occur alone, or in combination. They fall under one of the following six (6) categories: no benefit or worsening of symptoms; bleeding; infection; nerve damage; allergic reactions; and/or death. No benefits or worsening of symptoms: In Medicine there are no guarantees, only probabilities. No healthcare provider can ever guarantee that a medical treatment will work, they can only state the probability that it may. Furthermore, there is always the possibility that the condition may worsen, either directly, or indirectly, as a consequence of the treatment. Bleeding: This is more common if the patient is taking a blood thinner, either prescription or over the counter (example: Goody Powders, Fish oil, Aspirin, Garlic, etc.), or if suffering a condition associated with impaired coagulation (example: Hemophilia, cirrhosis of the liver, low platelet counts, etc.). However, even if you do not have one on these, it can still happen. If you have any of these conditions, or take one of these drugs, make sure to notify your treating physician. Infection: This is more common in patients with a compromised immune system, either due to disease (example: diabetes, cancer, human immunodeficiency virus [HIV], etc.), or due to medications or treatments (example: therapies used to treat cancer and rheumatological diseases). However, even if you do not have one on these, it can still happen. If you have any of these conditions, or take one of these drugs, make sure to notify your treating physician. Nerve Damage: This is more common when the treatment is an invasive one, but it can also happen with the use of medications, such as those used in the treatment  of cancer. The damage can occur to small secondary nerves, or to large primary ones, such as those in the spinal cord and brain. This damage may be temporary or permanent and it may lead to impairments that can range from temporary numbness to  permanent paralysis and/or brain death. Allergic Reactions: Any time a substance or material comes in contact with our body, there is the possibility of an allergic reaction. These can range from a mild skin rash (contact dermatitis) to a severe systemic reaction (anaphylactic reaction), which can result in death. Death: In general, any medical intervention can result in death, most of the time due to an unforeseen complication. ____________________________________________________________________________________________

## 2022-09-24 ENCOUNTER — Other Ambulatory Visit (HOSPITAL_BASED_OUTPATIENT_CLINIC_OR_DEPARTMENT_OTHER): Payer: Medicare (Managed Care) | Admitting: Pain Medicine

## 2022-09-24 DIAGNOSIS — M542 Cervicalgia: Secondary | ICD-10-CM

## 2022-09-24 DIAGNOSIS — M47812 Spondylosis without myelopathy or radiculopathy, cervical region: Secondary | ICD-10-CM

## 2022-09-24 DIAGNOSIS — M5481 Occipital neuralgia: Secondary | ICD-10-CM

## 2022-09-24 DIAGNOSIS — M5030 Other cervical disc degeneration, unspecified cervical region: Secondary | ICD-10-CM

## 2022-09-24 NOTE — Progress Notes (Signed)
The patient has called indicating that he wants to have a referral to Dr. Cari Caraway for possible surgical evaluation of cervical spine problems.  He does have an MRI that was done on 10/01/2021 of the cervical spine with the following results:  IMPRESSION: 1. 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 And advanced disc and endplate degeneration at C5-C6 and C6-C7. 2. 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 But no cord signal abnormality identified.  3. 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

## 2022-09-25 ENCOUNTER — Telehealth: Payer: Self-pay | Admitting: Cardiology

## 2022-09-25 NOTE — Telephone Encounter (Signed)
Pt c/o medication issue:  1. Name of Medication:  sacubitril-valsartan (ENTRESTO) 49-51 MG   2. How are you currently taking this medication (dosage and times per day)? Take 1 tablet by mouth 2 (two) times daily.   3. Are you having a reaction (difficulty breathing--STAT)? no  4. What is your medication issue? Patient states he has developed itchiness in his face, and scalp. He said the itchiness has been keeping him up at night. States his face is a little bit swollen as well.  He wants to know if that is one of the side effects of this medication.

## 2022-09-25 NOTE — Telephone Encounter (Signed)
Spoke with the patient and advised him to stop taking Entresto. He is not having any difficulty breathing. Advised if this were to occur he would need to go to the ER. He verbalized understanding. He has taken some Benadryl however with not much relief. Will let Dr. Quentin Ore know for further advisement.

## 2022-09-25 NOTE — Telephone Encounter (Signed)
This sounds like an allergic reaction to [facility] - pt should stop taking. Swelling of his face sounds like he is experiencing angioedema. Would confirm he hasn't had any breathing issues, would need to call 911 if he experiences breathing issues.

## 2022-09-25 NOTE — Telephone Encounter (Signed)
Attempted to call patient at both numbers listed. Unable to leave voicemail.

## 2022-09-30 MED ORDER — LOSARTAN POTASSIUM 50 MG PO TABS: 50.0000 mg | ORAL_TABLET | Freq: Every day | ORAL | 3 refills | Status: AC

## 2022-09-30 NOTE — Telephone Encounter (Signed)
Spoke with the patient and gave him recommendations from Dr. Quentin Ore to restart losartan. Patient states his symptoms are still present so he is not sure if it was caused by the [facility] as he has now been off of it for about 5 days. He is not aware of anything that he has been taking or has used different recently. Will continue to monitor and reach out to PCP if symptoms do not resolve.

## 2022-09-30 NOTE — Telephone Encounter (Signed)
Left message for patient to call back  

## 2022-10-04 ENCOUNTER — Other Ambulatory Visit: Payer: Self-pay | Admitting: Acute Care

## 2022-10-04 DIAGNOSIS — Z122 Encounter for screening for malignant neoplasm of respiratory organs: Secondary | ICD-10-CM

## 2022-10-04 DIAGNOSIS — Z87891 Personal history of nicotine dependence: Secondary | ICD-10-CM

## 2022-10-04 DIAGNOSIS — F17210 Nicotine dependence, cigarettes, uncomplicated: Secondary | ICD-10-CM

## 2022-10-07 ENCOUNTER — Telehealth: Payer: Self-pay | Admitting: Cardiology

## 2022-10-07 DIAGNOSIS — I480 Paroxysmal atrial fibrillation: Secondary | ICD-10-CM

## 2022-10-07 MED ORDER — ELIQUIS 5 MG PO TABS: 5.0000 mg | ORAL_TABLET | Freq: Two times a day (BID) | ORAL | 5 refills | Status: DC

## 2022-10-07 NOTE — Telephone Encounter (Signed)
Eliquis 5mg  refill request received. Patient is 77 years old, weight-92.5kg, Crea-0.91 on 01/30/22, Diagnosis-Afib, and last seen by Dr. Quentin Ore on 08/07/22. Dose is appropriate based on dosing criteria. Will send in refill to requested pharmacy.

## 2022-10-07 NOTE — Telephone Encounter (Signed)
*  STAT* If patient is at the pharmacy, call can be transferred to refill team.   1. Which medications need to be refilled? (please list name of each medication and dose if known)   ELIQUIS 5 MG TABS tablet    2. Which pharmacy/location (including street and city if local pharmacy) is medication to be sent to? [facility] [place], [place]   3. Do they need a 30 day or 90 day supply? 30 day

## 2022-10-07 NOTE — Telephone Encounter (Signed)
Refill request

## 2022-10-11 ENCOUNTER — Ambulatory Visit
Admission: RE | Admit: 2022-10-11 | Discharge: 2022-10-11 | Disposition: A | Discharge status: Home / Self Care | Payer: Medicare (Managed Care) | Source: Ambulatory Visit | Attending: Pain Medicine | Admitting: Pain Medicine

## 2022-10-11 DIAGNOSIS — M5030 Other cervical disc degeneration, unspecified cervical region: Secondary | ICD-10-CM | POA: Insufficient documentation

## 2022-10-11 DIAGNOSIS — M5481 Occipital neuralgia: Secondary | ICD-10-CM | POA: Diagnosis present

## 2022-10-11 DIAGNOSIS — M47812 Spondylosis without myelopathy or radiculopathy, cervical region: Secondary | ICD-10-CM | POA: Insufficient documentation

## 2022-10-11 DIAGNOSIS — M542 Cervicalgia: Secondary | ICD-10-CM | POA: Diagnosis present

## 2022-10-13 NOTE — Progress Notes (Unsigned)
PROVIDER NOTE: Information contained herein reflects review and annotations entered in association with encounter. Interpretation of such information and data should be left to medically-trained personnel. Information provided to patient can be located elsewhere in the medical record under "Patient Instructions". Document created using STT-dictation technology, any transcriptional errors that may result from process are unintentional.    Patient: [name]  Service Category: E/M  Provider: [name], MD  DOB: 1945-11-27  DOS: 10/14/2022  Referring Provider: Adin Hector, MD  MRN: [id number]  Specialty: Interventional Pain Management  PCP: Adin Hector, MD  Type: Established Patient  Setting: Ambulatory outpatient    Location: Office  Delivery: Face-to-face     HPI  [name], a 77 y.o. year old male, is here today because of his No primary diagnosis found.. [name] primary complain today is No chief complaint on file.  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[place]); Chronic ankle pain (Bilateral); Pain in joint, ankle and foot; Polyneuropathy associated with underlying disease ([place]); Presence of intrathecal pump; Ankle sprain; Rotator cuff sprain; Lumbosacral radiculopathy at S1; Chronic feet pain (3ry area of Pain) (Bilateral) (R>L); Chronic knee pain (4th area of Pain) (Bilateral) (R>L); Failed back surgical syndrome; Lower extremity weakness (Bilateral); Chronic neuropathic pain; Peripheral neurogenic pain; Nonfamilial nocturnal leg cramps (Bilateral); Restless leg syndrome; Pain in right knee; DDD (degenerative disc disease), lumbosacral; Abnormal MRI, lumbar spine (02/12/2022); 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; Lumbar facet arthropathy (Multilevel) (Bilateral); Lumbosacral facet syndrome (Bilateral); Lumbar foraminal narrowing (Multilevel) (Bilateral); Cervicalgia; Chronic shoulder pain (Bilateral) (R>L); Radicular pain of shoulder; Occipital neuralgia (Right); Cervico-occipital neuralgia (Right); Cervical facet hypertrophy (Multilevel) (Bilateral); Cervical facet pain; Painful cervical range of motion; Chronic neurogenic pain; Left rib fracture; Chronic pain; Foot pain; Lumbago; 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 Pain Assessment: Severity of   is reported as a  /10. Location:    / . Onset:  . Quality:  . Timing:  . Modifying factor(s):  Marland Kitchen Vitals:  vitals were not taken for this visit.  BMI: Estimated body mass index is 25.5 kg/m as calculated from the following:   Height as of 09/23/22: 6\' 3"  (1.905 m).   Weight as of 09/23/22: 204 lb (92.5 kg). Last encounter: 09/23/2022. Last procedure: Visit date not found.  Reason for encounter:  *** . ***  Pharmacotherapy Assessment  Analgesic: Hydromorphone 4 mg tablet, 1 tab p.o. 3 times daily MME/day: 60 mg/day    Monitoring: [place] PMP: PDMP reviewed during this encounter.       Pharmacotherapy: No side-effects or adverse reactions reported. Compliance: No problems identified. Effectiveness: Clinically acceptable.  No notes on file  No results found for: "CBDTHCR" No results found for: "D8THCCBX" No results found for: "D9THCCBX"  UDS:  Summary  Date Value Ref Range Status  09/19/2021 Note  Final    Comment:     ==================================================================== [facility] (MW) ==================================================================== Test  Result       Flag       Units  Drug Present and Declared for Prescription Verification   Hydromorphone                  1862         EXPECTED   ng/mg creat    Hydromorphone may be administered as a scheduled prescription    medication; it is also an expected metabolite of hydrocodone.  ==================================================================== Test                      Result    Flag   Units      Ref Range   Creatinine              26               mg/dL      >=20 ==================================================================== Declared Medications:  The flagging and interpretation on this report are based on the  following declared medications.  Unexpected results may arise from  inaccuracies in the declared medications.   **Note: The testing scope of this panel includes these medications:   Hydromorphone (Dilaudid)   **Note: The testing scope of this panel does not include the  following reported medications:   Albuterol (Proair HFA)  Amitriptyline (Elavil)  Azelastine  Bisacodyl  Clonidine (Catapres)  Duloxetine (Cymbalta)  Eye Drop  Levothyroxine (Synthroid)  Losartan (Cozaar)  Multivitamin  Naloxone (Narcan)  Olodaterol  Pregabalin (Lyrica)  Promethazine (Phenergan)  Simvastatin (Zocor)  Tiotropium ==================================================================== For clinical consultation, please call [phone]. ====================================================================       ROS  Constitutional: Denies any fever or chills Gastrointestinal: No reported hemesis, hematochezia, vomiting, or acute GI distress Musculoskeletal: Denies any acute onset joint swelling, redness, loss of ROM, or weakness Neurological: No reported episodes of  acute onset apraxia, aphasia, dysarthria, agnosia, amnesia, paralysis, loss of coordination, or loss of consciousness  Medication Review  DULoxetine, HYDROmorphone, albuterol, amitriptyline, apixaban, aspirin EC, azelastine, bisacodyl, cloNIDine, esomeprazole, latanoprost, levothyroxine, loratadine, losartan, mexiletine, naloxone, pregabalin, promethazine, simvastatin, and tiZANidine  History Review  Allergy: [name] is allergic to celebrex [celecoxib], codeine, penicillins, and entresto [sacubitril-valsartan]. Drug: [name]  reports no history of drug use. Alcohol:  reports no history of alcohol use. Tobacco:  reports that he has been smoking cigarettes. He has a 44.25 pack-year smoking history. He has never used smokeless tobacco. Social: [name]  reports that he has been smoking cigarettes. He has a 44.25 pack-year smoking history. He has never used smokeless tobacco. He reports that he does not drink alcohol and does not use drugs. Medical:  has a past medical history of Acute anterolateral wall MI ([place]) (2000), Acute non-Q wave non-ST elevation myocardial infarction (NSTEMI) ([place]) (12/03/2004), Adenomatous polyp of colon, Agent orange exposure, Angina pectoris ([place]), Anxiety, Aortic atherosclerosis ([place]), Asthma, Bladder stones, Bradycardia, CHF (congestive heart failure) ([place]), Chronic lower back pain, Chronic pain syndrome, COPD (chronic obstructive pulmonary disease) ([place]), Coronary artery disease, Depression, DJD (degenerative joint disease), DVT of lower extremity, bilateral ([place]), Dyspnea, GERD (gastroesophageal reflux disease), History of hiatal hernia, HLD (hyperlipidemia), Hypertension, Hypothyroidism, Inflammatory polyarthropathy ([place]), Internal hemorrhoids, Long term prescription opiate use, 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 Surgical: [name]  has a past surgical history that includes Coronary angioplasty; Posterior lumbar fusion (N/A, 1999); Nissen fundoplication (N/A, [id number]); Mastoidectomy; Partial colectomy (N/A); Intrathecal pump implantation (N/A, 03/2008); Intrathecal pump revision (N/A, 09/28/2014); Transurethral resection of bladder tumor (N/A, 03/07/2015); Colonoscopy with propofol (N/A, 02/27/2018); External ear surgery; Skin cancer excision; IVC FILTER PLACEMENT ([facility]) (N/A, 09/2007); IVC FILTER REMOVAL (N/A, 2011); Colonoscopy (N/A, 11/22/2013); Colonoscopy (N/A, 09/19/2008); Colonoscopy (N/A, 08/25/2003); Colonoscopy (N/A, 10/15/1994); Posterior laminectomy / decompression cervical spine (N/A, 1984); Posterior laminectomy / decompression cervical spine (N/A, 1985); Tonsillectomy (Bilateral, 1958); Coronary angioplasty with stent (Left, 12/04/2004); LEFT HEART CATH AND CORONARY ANGIOGRAPHY (Left, 02/19/2008); LEFT HEART CATH AND CORONARY ANGIOGRAPHY (Left, 04/18/1987); LEFT HEART CATH AND CORONARY ANGIOGRAPHY (Left, 02/03/2002); and Pain pump removal (N/A, 12/20/2021). Family: family history includes Emphysema in his father and sister; Heart attack in his father; Heart disease in his brother and mother.  Laboratory Chemistry Profile   Renal Lab Results  Component Value Date   BUN 10 01/30/2022   CREATININE 0.91 01/30/2022   BCR 11 01/30/2022   GFRAA >60 03/02/2015   GFRNONAA >60 11/01/2021    Hepatic Lab Results  Component Value Date   AST 31 01/30/2022   ALBUMIN 4.0 01/30/2022   ALKPHOS 96 01/30/2022    Electrolytes Lab Results  Component Value Date   NA 145 (H)  01/30/2022   K 5.2 01/30/2022   CL 104 01/30/2022   CALCIUM 9.6 01/30/2022   MG 2.3 11/01/2021   PHOS 3.7 11/01/2021    Bone Lab Results  Component Value Date   25OHVITD1 22 (L) 02/07/2021   25OHVITD2 <1.0 02/07/2021   25OHVITD3 22 02/07/2021    Inflammation (CRP: Acute Phase) (ESR: Chronic Phase) Lab Results  Component Value Date   CRP 5 01/30/2022   ESRSEDRATE 11 01/30/2022   LATICACIDVEN 0.9 10/28/2021         Note: Above Lab results reviewed.  Recent Imaging Review  MR LUMBAR SPINE WO CONTRAST CLINICAL DATA:  Low back pain and bilateral leg pain for 3 weeks. History of surgery in 1999.  EXAM: MRI LUMBAR SPINE WITHOUT CONTRAST  TECHNIQUE: Multiplanar, multisequence MR imaging of the lumbar spine was performed. No intravenous contrast was administered.  COMPARISON:  CT abdomen/pelvis 01/25/2022, lumbar spine MRI 10/21/2005  FINDINGS: Segmentation: Standard; the lowest formed disc space is designated L5-S1  Alignment: There is grade 1 anterolisthesis of L4 on L5, unchanged since the recent prior CT abdomen/pelvis but new since 2007.  Vertebrae: Vertebral body heights are preserved. Background marrow signal is normal. There is no suspicious marrow signal abnormality or marrow edema.  A focus of T1 and T2 hyperintensity in the right iliac bone is unchanged since 2007, benign. A similar appearing lesion in the left iliac bone is also likely benign.  Conus medullaris and cauda equina: Conus extends to the L1 level. Conus and cauda equina appear normal.  Paraspinal and other soft tissues: Unremarkable.  Disc levels:  T12-L1: No significant spinal canal or neural foraminal stenosis  L1-L2: There is a shallow central protrusion and mild facet arthropathy without significant spinal canal or neural foraminal stenosis.  L2-L3: There is a disc bulge eccentric to the left and mild bilateral facet arthropathy resulting in narrowing of the left subarticular  zone with potential mass effect on the traversing left L3 nerve root (8-17, 5-11), and no significant neural foraminal stenosis  L3-L4: There is a diffuse  disc bulge with a prominent left foraminal/extraforaminal protrusion/extrusion and bilateral facet arthropathy with ligamentum flavum thickening resulting in moderate spinal canal stenosis with bilateral subarticular zone narrowing, left worse than right, without evidence of frank impingement and moderate left and mild right neural foraminal stenosis with contact and probable displacement of the exiting left L3 nerve root and possible contact of the exiting right L3 nerve root (5-14, 5-4).  L4-L5: There is grade 1 anterolisthesis with uncovering of the disc posteriorly. Suspect decompressive laminectomies at this level. There is a mild disc bulge and moderate facet arthropathy resulting in moderate spinal canal stenosis with left worse than right subarticular zone narrowing but no evidence of frank impingement and moderate left and mild right neural foraminal stenosis  L5-S1: Status post decompressive laminectomies. There is bilateral facet arthropathy without significant spinal canal or neural foraminal stenosis.  IMPRESSION: 1. L2-L3: Disc bulge eccentric to the left resulting in narrowing of the left subarticular zone with possible impingement of the traversing left L3 nerve root. 2. L3-L4: Disc bulge with a prominent left foraminal/extraforaminal protrusion and bilateral facet arthropathy resulting in moderate spinal canal stenosis with left worse than right subarticular zone narrowing and moderate left and mild right neural foraminal stenosis with contact and possible displacement of either or both exiting L3 nerve roots, left worse than right. 3. L4-L5: Suspect decompressive laminectomies. Grade 1 anterolisthesis and bulky facet arthropathy results in moderate spinal canal stenosis with left worse than right subarticular  zone narrowing and moderate left neural foraminal stenosis. 4. L5-S1: Decompressive laminectomies with bilateral facet arthropathy no significant spinal canal or neural foraminal stenosis.  Electronically Signed   By: Valetta Mole M.D.   On: 02/12/2022 12:14 Note: Reviewed        Physical Exam  General appearance: Well nourished, well developed, and well hydrated. In no apparent acute distress Mental status: Alert, oriented x 3 (person, place, & time)       Respiratory: No evidence of acute respiratory distress Eyes: PERLA Vitals: There were no vitals taken for this visit. BMI: Estimated body mass index is 25.5 kg/m as calculated from the following:   Height as of 09/23/22: 6\' 3"  (1.905 m).   Weight as of 09/23/22: 204 lb (92.5 kg). Ideal: Patient weight not recorded  Assessment   Diagnosis Status  No diagnosis found. Controlled Controlled Controlled   Updated Problems: No problems updated.  Plan of Care  Problem-specific:  No problem-specific Assessment & Plan notes found for this encounter.  [name] has a current medication list which includes the following long-term medication(s): albuterol, amitriptyline, azelastine, duloxetine, eliquis, esomeprazole, hydromorphone, hydromorphone, levothyroxine, loratadine, losartan, mexiletine, pregabalin, and simvastatin.  Pharmacotherapy (Medications Ordered): No orders of the defined types were placed in this encounter.  Orders:  No orders of the defined types were placed in this encounter.  Follow-up plan:   No follow-ups on file.      Interventional Therapies  Risk Factors  Complex Considerations:  Anticoagulation: Eliquis (Stop: 3 days  Restart: 6 hrs)  T2IDDM  CAD  CHF  COPD  Hx. Nephrolithiasis  A-Fib  HTN  IBS  Hx. NSTEMI MI  Hx DVT  Depression/Anxiety    Planned  Pending:   Therapeutic left caudal ESI #2    Under consideration:   Diagnostic/therapeutic bilateral lumbar facet MBB #1   Therapeutic/palliative left L4-5 LESI #1    Completed:   Diagnostic intrathecal pump catheter test/evaluation x1 (06/21/2021) (Dx: Catheter malfunction/dysfunction)  Intrathecal catheter and pump removal  by me (12/20/2021) (2ry to end of battery life & catheter malfunction)  Diagnostic left caudal ESI and epidurogram x1 (04/24/2021) (LBP: 50/50/0  LEP; 100/100/50-60)    Completed by other providers:   Referral (02/07/2021 & 09/19/2021) to [facility] neurosurgery Deetta Perla, MD) for ITP replacement.  Cardiology clearance to Dr. Clayborn Bigness requested (09/19/2021)    Therapeutic  Palliative (PRN) options:   None established   Pharmacotherapy  Nonopioids transferred (03/20/2022): Pregabalin (Lyrica) 75 mg TID.        Recent Visits Date Type Provider Dept  09/23/22 Office Visit Milinda Pointer, MD [facility]  Showing recent visits within past 90 days and meeting all other requirements Future Appointments Date Type Provider Dept  10/14/22 Appointment Milinda Pointer, MD [facility]  Showing future appointments within next 90 days and meeting all other requirements  I discussed the assessment and treatment plan with the patient. The patient was provided an opportunity to ask questions and all were answered. The patient agreed with the plan and demonstrated an understanding of the instructions.  Patient advised to call back or seek an in-person evaluation if the symptoms or condition worsens.  Duration of encounter: *** minutes.  Total time on encounter, as per AMA guidelines included both the face-to-face and non-face-to-face time personally spent by the physician and/or other qualified health care professional(s) on the day of the encounter (includes time in activities that require the physician or other qualified health care professional and does not include time in activities normally performed by clinical staff). Physician's time may include the following activities  when performed: Preparing to see the patient (e.g., pre-charting review of records, searching for previously ordered imaging, lab work, and nerve conduction tests) Review of prior analgesic pharmacotherapies. Reviewing PMP Interpreting ordered tests (e.g., lab work, imaging, nerve conduction tests) Performing post-procedure evaluations, including interpretation of diagnostic procedures Obtaining and/or reviewing separately obtained history Performing a medically appropriate examination and/or evaluation Counseling and educating the patient/family/caregiver Ordering medications, tests, or procedures Referring and communicating with other health care professionals (when not separately reported) Documenting clinical information in the electronic or other health record Independently interpreting results (not separately reported) and communicating results to the patient/ family/caregiver Care coordination (not separately reported)  Note by: [name], MD Date: 10/14/2022; Time: 6:37 PM

## 2022-10-14 ENCOUNTER — Ambulatory Visit: Payer: Medicare (Managed Care) | Attending: Pain Medicine | Admitting: Pain Medicine

## 2022-10-14 ENCOUNTER — Telehealth: Payer: Self-pay | Admitting: *Deleted

## 2022-10-14 ENCOUNTER — Encounter: Payer: Self-pay | Admitting: Pain Medicine

## 2022-10-14 VITALS — BP 155/89 | HR 73 | Temp 97.0°F | Resp 18 | Ht 75.0 in | Wt 205.0 lb

## 2022-10-14 DIAGNOSIS — M5442 Lumbago with sciatica, left side: Secondary | ICD-10-CM

## 2022-10-14 DIAGNOSIS — M961 Postlaminectomy syndrome, not elsewhere classified: Secondary | ICD-10-CM | POA: Insufficient documentation

## 2022-10-14 DIAGNOSIS — M25561 Pain in right knee: Secondary | ICD-10-CM | POA: Diagnosis present

## 2022-10-14 DIAGNOSIS — M79671 Pain in right foot: Secondary | ICD-10-CM

## 2022-10-14 DIAGNOSIS — M25562 Pain in left knee: Secondary | ICD-10-CM | POA: Diagnosis present

## 2022-10-14 DIAGNOSIS — M79605 Pain in left leg: Secondary | ICD-10-CM | POA: Insufficient documentation

## 2022-10-14 DIAGNOSIS — M79672 Pain in left foot: Secondary | ICD-10-CM | POA: Diagnosis present

## 2022-10-14 DIAGNOSIS — G8929 Other chronic pain: Secondary | ICD-10-CM | POA: Insufficient documentation

## 2022-10-14 DIAGNOSIS — Z79891 Long term (current) use of opiate analgesic: Secondary | ICD-10-CM | POA: Insufficient documentation

## 2022-10-14 DIAGNOSIS — Z79899 Other long term (current) drug therapy: Secondary | ICD-10-CM

## 2022-10-14 DIAGNOSIS — M79604 Pain in right leg: Secondary | ICD-10-CM | POA: Insufficient documentation

## 2022-10-14 DIAGNOSIS — M5441 Lumbago with sciatica, right side: Secondary | ICD-10-CM | POA: Diagnosis not present

## 2022-10-14 DIAGNOSIS — G894 Chronic pain syndrome: Secondary | ICD-10-CM | POA: Diagnosis not present

## 2022-10-14 MED ORDER — HYDROMORPHONE HCL 4 MG PO TABS: 4.0000 mg | ORAL_TABLET | Freq: Three times a day (TID) | ORAL | 0 refills | Status: DC | PRN

## 2022-10-14 NOTE — Patient Instructions (Signed)
____________________________________________________________________________________________  Opioid Pain Medication Update  To: All patients taking opioid pain medications. (I.e.: hydrocodone, hydromorphone, oxycodone, oxymorphone, morphine, codeine, methadone, tapentadol, tramadol, buprenorphine, fentanyl, etc.)  Re: Updated review of side effects and adverse reactions of opioid analgesics, as well as new information about long term effects of this class of medications.  Direct risks of long-term opioid therapy are not limited to opioid addiction and overdose. Potential medical risks include serious fractures, breathing problems during sleep, hyperalgesia, immunosuppression, chronic constipation, bowel obstruction, myocardial infarction, and tooth decay secondary to xerostomia.  Unpredictable adverse effects that can occur even if you take your medication correctly: Cognitive impairment, respiratory depression, and death. Most people think that if they take their medication "correctly", and "as instructed", that they will be safe. Nothing could be farther from the truth. In reality, a significant amount of recorded deaths associated with the use of opioids has occurred in individuals that had taken the medication for a long time, and were taking their medication correctly. The following are examples of how this can happen: Patient taking his/her medication for a long time, as instructed, without any side effects, is given a certain antibiotic or another unrelated medication, which in turn triggers a "Drug-to-drug interaction" leading to disorientation, cognitive impairment, impaired reflexes, respiratory depression or an untoward event leading to serious bodily harm or injury, including death.  Patient taking his/her medication for a long time, as instructed, without any side effects, develops an acute impairment of liver and/or kidney function. This will lead to a rapid inability of the body to  breakdown and eliminate their pain medication, which will result in effects similar to an "overdose", but with the same medicine and dose that they had always taken. This again may lead to disorientation, cognitive impairment, impaired reflexes, respiratory depression or an untoward event leading to serious bodily harm or injury, including death.  A similar problem will occur with patients as they grow older and their liver and kidney function begins to decrease as part of the aging process.  Background information: Historically, the original case for using long-term opioid therapy to treat chronic noncancer pain was based on safety assumptions that subsequent experience has called into question. In 1996, the American Pain Society and the American Academy of Pain Medicine issued a consensus statement supporting long-term opioid therapy. This statement acknowledged the dangers of opioid prescribing but concluded that the risk for addiction was low; respiratory depression induced by opioids was short-lived, occurred mainly in opioid-naive patients, and was antagonized by pain; tolerance was not a common problem; and efforts to control diversion should not constrain opioid prescribing. This has now proven to be wrong. Experience regarding the risks for opioid addiction, misuse, and overdose in community practice has failed to support these assumptions.  According to the Centers for Disease Control and Prevention, fatal overdoses involving opioid analgesics have increased sharply over the past decade. Currently, more than 96,700 people die from drug overdoses every year. Opioids are a factor in 7 out of every 10 overdose deaths. Deaths from drug overdose have surpassed motor vehicle accidents as the leading cause of death for individuals between the ages of 35 and 54.  Clinical data suggest that neuroendocrine dysfunction may be very common in both men and women, potentially causing hypogonadism, erectile  dysfunction, infertility, decreased libido, osteoporosis, and depression. Recent studies linked higher opioid dose to increased opioid-related mortality. Controlled observational studies reported that long-term opioid therapy may be associated with increased risk for cardiovascular events. Subsequent meta-analysis concluded   that the safety of long-term opioid therapy in elderly patients has not been proven.   Side Effects and adverse reactions: Common side effects: Drowsiness (sedation). Dizziness. Nausea and vomiting. Constipation. Physical dependence -- Dependence often manifests with withdrawal symptoms when opioids are discontinued or decreased. Tolerance -- As you take repeated doses of opioids, you require increased medication to experience the same effect of pain relief. Respiratory depression -- This can occur in healthy people, especially with higher doses. However, people with COPD, asthma or other lung conditions may be even more susceptible to fatal respiratory impairment.  Uncommon side effects: An increased sensitivity to feeling pain and extreme response to pain (hyperalgesia). Chronic use of opioids can lead to this. Delayed gastric emptying (the process by which the contents of your stomach are moved into your small intestine). Muscle rigidity. Immune system and hormonal dysfunction. Quick, involuntary muscle jerks (myoclonus). Arrhythmia. Itchy skin (pruritus). Dry mouth (xerostomia).  Long-term side effects: Chronic constipation. Sleep-disordered breathing (SDB). Increased risk of bone fractures. Hypothalamic-pituitary-adrenal dysregulation. Increased risk of overdose.  RISKS: Fractures and Falls:  Opioids increase the risk and incidence of falls. This is of particular importance in elderly patients.  Endocrine System:  Long-term administration is associated with endocrine abnormalities (endocrinopathies). (Also known as Opioid-induced Endocrinopathy) Influences  on both the hypothalamic-pituitary-adrenal axis?and the hypothalamic-pituitary-gonadal axis have been demonstrated with consequent hypogonadism and adrenal insufficiency in both sexes. Hypogonadism and decreased levels of dehydroepiandrosterone sulfate have been reported in men and women. Endocrine effects include: Amenorrhoea in women (abnormal absence of menstruation) Reduced libido in both sexes Decreased sexual function Erectile dysfunction in men Hypogonadisms (decreased testicular function with shrinkage of testicles) Infertility Depression and fatigue Loss of muscle mass Anxiety Depression Immune suppression Hyperalgesia Weight gain Anemia Osteoporosis Patients (particularly women of childbearing age) should avoid opioids. There is insufficient evidence to recommend routine monitoring of asymptomatic patients taking opioids in the long-term for hormonal deficiencies.  Immune System: Human studies have demonstrated that opioids have an immunomodulating effect. These effects are mediated via opioid receptors both on immune effector cells and in the central nervous system. Opioids have been demonstrated to have adverse effects on antimicrobial response and anti-tumour surveillance. Buprenorphine has been demonstrated to have no impact on immune function.  Opioid Induced Hyperalgesia: Human studies have demonstrated that prolonged use of opioids can lead to a state of abnormal pain sensitivity, sometimes called opioid induced hyperalgesia (OIH). Opioid induced hyperalgesia is not usually seen in the absence of tolerance to opioid analgesia. Clinically, hyperalgesia may be diagnosed if the patient on long-term opioid therapy presents with increased pain. This might be qualitatively and anatomically distinct from pain related to disease progression or to breakthrough pain resulting from development of opioid tolerance. Pain associated with hyperalgesia tends to be more diffuse than the  pre-existing pain and less defined in quality. Management of opioid induced hyperalgesia requires opioid dose reduction.  Cancer: Chronic opioid therapy has been associated with an increased risk of cancer among noncancer patients with chronic pain. This association was more evident in chronic strong opioid users. Chronic opioid consumption causes significant pathological changes in the small intestine and colon. Epidemiological studies have found that there is a link between opium dependence and initiation of gastrointestinal cancers. Cancer is the second leading cause of death after cardiovascular disease. Chronic use of opioids can cause multiple conditions such as GERD, immunosuppression and renal damage as well as carcinogenic effects, which are associated with the incidence of cancers.   Mortality: Long-term opioid use   has been associated with increased mortality among patients with chronic non-cancer pain (CNCP).  Prescription of long-acting opioids for chronic noncancer pain was associated with a significantly increased risk of all-cause mortality, including deaths from causes other than overdose.  Reference: Von Korff M, Kolodny A, Deyo RA, Chou R. Long-term opioid therapy reconsidered. Ann Intern Med. 2011 Sep 6;155(5):325-8. doi: 10.7326/[id number]. PMID: 21893626; PMCID: PMC3280085. Bedson J, Chen Y, Ashworth J, Hayward RA, Dunn KM, Jordan KP. Risk of adverse events in patients prescribed long-term opioids: A cohort study in the [place] Clinical Practice Research Datalink. Eur J Pain. 2019 May;23(5):908-922. doi: 10.1002/ejp.1357. Epub 2019 Jan 31. PMID: 30620116. Colameco S, Coren JS, Ciervo CA. Continuous opioid treatment for chronic noncancer pain: a time for moderation in prescribing. Postgrad Med. 2009 Jul;121(4):61-6. doi: 10.3810/pgm.2009.07.2032. PMID: 19641271. Chou R, Turner JA, Devine EB, Hansen RN, Sullivan SD, Blazina I, Dana T, Bougatsos C, Deyo RA. The  effectiveness and risks of long-term opioid therapy for chronic pain: a systematic review for a National Institutes of Health Pathways to Prevention Workshop. Ann Intern Med. 2015 Feb 17;162(4):276-86. doi: 10.7326/M14-2559. PMID: 25581257. Warner M, Chen LH, Makuc DM. NCHS Data Brief No. 22. [place]: Centers for Disease Control and Prevention; 2009. Sep, Increase in Fatal Poisonings Involving Opioid Analgesics in the [place], 1999-2006. Song IA, Choi HR, Oh TK. Long-term opioid use and mortality in patients with chronic non-cancer pain: Ten-year follow-up study in [place] from 2010 through 2019. EClinicalMedicine. 2022 Jul 18;51:101558. doi: 10.1016/j.eclinm.2022.101558. PMID: 35875817; PMCID: PMC9304910. Huser, W., Schubert, T., Vogelmann, T. et al. All-cause mortality in patients with long-term opioid therapy compared with non-opioid analgesics for chronic non-cancer pain: a database study. BMC Med 18, 162 (2020). https://doi.org/10.1186/[email or web address] Rashidian H, Zendehdel K, Kamangar F, Malekzadeh R, Haghdoost AA. An Ecological Study of the Association between Opiate Use and Incidence of Cancers. Addict Health. 2016 Fall;8(4):252-260. PMID: 28819556; PMCID: PMC5554805.  Our Goal: Our goal is to control your pain with means other than the use of opioid pain medications.  Our Recommendation: Talk to your physician about coming off of these medications. We can assist you with the tapering down and stopping these medicines. Based on the new information, even if you cannot completely stop the medication, a decrease in the dose may be associated with a lesser risk. Ask for other means of controlling the pain. Decrease or eliminate those factors that significantly contribute to your pain such as smoking, obesity, and a diet heavily tilted towards "inflammatory" nutrients.  Last Updated: 09/18/2022    ____________________________________________________________________________________________     ____________________________________________________________________________________________  Patient Information update  To: All of our patients.  Re: Name change.  It has been made official that our current name, "[facility]"   will soon be changed to "[facility]".   The purpose of this change is to eliminate any confusion created by the concept of our practice being a "Medication Management Pain Clinic". In the past this has led to the misconception that we treat pain primarily by the use of prescription medications.  Nothing can be farther from the truth.   Understanding PAIN MANAGEMENT: To further understand what our practice does, you first have to understand that "Pain Management" is a subspecialty that requires additional training once a physician has completed their specialty training, which can be in either Anesthesia, Neurology, Psychiatry, or Physical Medicine and Rehabilitation (PMR). Each one of these contributes to the final approach taken by each physician to   the management of their patient's pain. To be a "Pain Management Specialist" you must have first completed one of the specialty trainings below.  Anesthesiologists - trained in clinical pharmacology and interventional techniques such as nerve blockade and regional as well as central neuroanatomy. They are trained to block pain before, during, and after surgical interventions.  Neurologists - trained in the diagnosis and pharmacological treatment of complex neurological conditions, such as Multiple Sclerosis, Parkinson's, spinal cord injuries, and other systemic conditions that may be associated with symptoms that may include but are not limited to pain. They tend to rely primarily on the treatment of chronic pain  using prescription medications.  Psychiatrist - trained in conditions affecting the psychosocial wellbeing of patients including but not limited to depression, anxiety, schizophrenia, personality disorders, addiction, and other substance use disorders that may be associated with chronic pain. They tend to rely primarily on the treatment of chronic pain using prescription medications.   Physical Medicine and Rehabilitation (PMR) physicians, also known as physiatrists - trained to treat a wide variety of medical conditions affecting the brain, spinal cord, nerves, bones, joints, ligaments, muscles, and tendons. Their training is primarily aimed at treating patients that have suffered injuries that have caused severe physical impairment. Their training is primarily aimed at the physical therapy and rehabilitation of those patients. They may also work alongside orthopedic surgeons or neurosurgeons using their expertise in assisting surgical patients to recover after their surgeries.  INTERVENTIONAL PAIN MANAGEMENT is sub-subspecialty of Pain Management.  Our physicians are Board-certified in Anesthesia, Pain Management, and Interventional Pain Management.  This meaning that not only have they been trained and Board-certified in their specialty of Anesthesia, and subspecialty of Pain Management, but they have also received further training in the sub-subspecialty of Interventional Pain Management, in order to become Board-certified as INTERVENTIONAL PAIN MANAGEMENT SPECIALIST.    Mission: Our goal is to use our skills in  INTERVENTIONAL PAIN MANAGEMENT as alternatives to the chronic use of prescription opioid medications for the treatment of pain. To make this more clear, we have changed our name to reflect what we do and offer. We will continue to offer medication management assessment and recommendations, but we will not be taking over any patient's medication  management.  ____________________________________________________________________________________________     ____________________________________________________________________________________________  National Pain Medication Shortage  The [place] is experiencing worsening drug shortages. These have had a negative widespread effect on patient care and treatment. Not expected to improve any time soon. Predicted to last past 2029.   Drug shortage list (generic names) Oxycodone IR Oxycodone/APAP Oxymorphone IR Hydromorphone Hydrocodone/APAP Morphine  Where is the problem?  Manufacturing and supply level.  Will this shortage affect you?  Only if you take any of the above pain medications.  How? You may be unable to fill your prescription.  Your pharmacist may offer a "partial fill" of your prescription. (Warning: Do not accept partial fills.) Prescriptions partially filled cannot be transferred to another pharmacy. Read our Medication Rules and Regulation. Depending on how much medicine you are dependent on, you may experience withdrawals when unable to get the medication.  Recommendations: Consider ending your dependence on opioid pain medications. Ask your pain specialist to assist you with the process. Consider switching to a medication currently not in shortage, such as Buprenorphine. Talk to your pain specialist about this option. Consider decreasing your pain medication requirements by managing tolerance thru "Drug Holidays". This may help minimize withdrawals, should you run out of medicine. Control your pain thru   the use of non-pharmacological interventional therapies.   Your prescriber: Prescribers cannot be blamed for shortages. Medication manufacturing and supply issues cannot be fixed by the prescriber.   NOTE: The prescriber is not responsible for supplying the medication, or solving supply issues. Work with your pharmacist to solve it. The patient is responsible for  the decision to take or continue taking the medication and for identifying and securing a legal supply source. By law, supplying the medication is the job and responsibility of the pharmacy. The prescriber is responsible for the evaluation, monitoring, and prescribing of these medications.   Prescribers will NOT: Re-issue prescriptions that have been partially filled. Re-issue prescriptions already sent to a pharmacy.  Re-send prescriptions to a different pharmacy because yours did not have your medication. Ask pharmacist to order more medicine or transfer the prescription to another pharmacy. (Read below.)  New 2023 regulation: "March 22, 2022 Revised Regulation Allows DEA-Registered Pharmacies to Transfer Electronic Prescriptions at a Patient's Request DEA Headquarters Division - Public Information Office Patients now have the ability to request their electronic prescription be transferred to another pharmacy without having to go back to their practitioner to initiate the request. This revised regulation went into effect on Monday, March 18, 2022.     At a patient's request, a DEA-registered retail pharmacy can now transfer an electronic prescription for a controlled substance (schedules II-V) to another DEA-registered retail pharmacy. Prior to this change, patients would have to go through their practitioner to cancel their prescription and have it re-issued to a different pharmacy. The process was taxing and time consuming for both patients and practitioners.    The Drug Enforcement Administration (DEA) published its intent to revise the process for transferring electronic prescriptions on June 09, 2020.  The final rule was published in the federal register on February 14, 2022 and went into effect 30 days later.  Under the final rule, a prescription can only be transferred once between pharmacies, and only if allowed under existing state or other applicable law. The prescription must  remain in its electronic form; may not be altered in any way; and the transfer must be communicated directly between two licensed pharmacists. It's important to note, any authorized refills transfer with the original prescription, which means the entire prescription will be filled at the same pharmacy".  Reference: https://www.dea.gov/stories/2023/2023-03/2022-09-01/revised-regulation-allows-dea-registered-pharmacies-transfer (DEA website announcement)  https://www.govinfo.gov/content/pkg/FR-2022-02-14/pdf/2023-15847.pdf (Federal Register  Department of Justice)   Federal Register / Vol. 88, No. 143 / Thursday, February 14, 2022 / Rules and Regulations DEPARTMENT OF JUSTICE  Drug Enforcement Administration  21 CFR Part 1306  [Docket No. [id number]]  RIN [id number]-AB64 Transfer of Electronic Prescriptions for Schedules II-V Controlled Substances Between Pharmacies for Initial Filling  ____________________________________________________________________________________________     ____________________________________________________________________________________________  Transfer of Pain Medication between Pharmacies  Re: 2023 DEA Clarification on existing regulation  Published on DEA Website: March 22, 2022  Title: Revised Regulation Allows DEA-Registered Pharmacies to Transfer Electronic Prescriptions at a Patient's Request DEA Headquarters Division - Public Information Office  "Patients now have the ability to request their electronic prescription be transferred to another pharmacy without having to go back to their practitioner to initiate the request. This revised regulation went into effect on Monday, March 18, 2022.     At a patient's request, a DEA-registered retail pharmacy can now transfer an electronic prescription for a controlled substance (schedules II-V) to another DEA-registered retail pharmacy. Prior to this change, patients would have to go through their practitioner to  cancel their prescription   and have it re-issued to a different pharmacy. The process was taxing and time consuming for both patients and practitioners.    The Drug Enforcement Administration (DEA) published its intent to revise the process for transferring electronic prescriptions on June 09, 2020.  The final rule was published in the federal register on February 14, 2022 and went into effect 30 days later.  Under the final rule, a prescription can only be transferred once between pharmacies, and only if allowed under existing state or other applicable law. The prescription must remain in its electronic form; may not be altered in any way; and the transfer must be communicated directly between two licensed pharmacists. It's important to note, any authorized refills transfer with the original prescription, which means the entire prescription will be filled at the same pharmacy."    REFERENCES: 1. DEA website announcement https://www.dea.gov/stories/2023/2023-03/2022-09-01/revised-regulation-allows-dea-registered-pharmacies-transfer  2. Department of Justice website  https://www.govinfo.gov/content/pkg/FR-2022-02-14/pdf/2023-15847.pdf  3. DEPARTMENT OF JUSTICE Drug Enforcement Administration 21 CFR Part 1306 [Docket No. [id number]] RIN [id number] "Transfer of Electronic Prescriptions for Schedules II-V Controlled Substances Between Pharmacies for Initial Filling"  ____________________________________________________________________________________________     _______________________________________________________________________  Medication Rules  Purpose: To inform patients, and their family members, of our medication rules and regulations.  Applies to: All patients receiving prescriptions from our practice (written or electronic).  Pharmacy of record: This is the pharmacy where your electronic prescriptions will be sent. Make sure we have the correct one.  Electronic prescriptions: In  compliance with the [place] Strengthen Opioid Misuse Prevention (STOP) Act of 2017 (Session Law 2017-74/H243), effective July 22, 2018, all controlled substances must be electronically prescribed. Written prescriptions, faxing, or calling prescriptions to a pharmacy will no longer be done.  Prescription refills: These will be provided only during in-person appointments. No medications will be renewed without a "face-to-face" evaluation with your provider. Applies to all prescriptions.  NOTE: The following applies primarily to controlled substances (Opioid* Pain Medications).   Type of encounter (visit): For patients receiving controlled substances, face-to-face visits are required. (Not an option and not up to the patient.)  Patient's responsibilities: Pain Pills: Bring all pain pills to every appointment (except for procedure appointments). Pill Bottles: Bring pills in original pharmacy bottle. Bring bottle, even if empty. Always bring the bottle of the most recent fill.  Medication refills: You are responsible for knowing and keeping track of what medications you are taking and when is it that you will need a refill. The day before your appointment: write a list of all prescriptions that need to be refilled. The day of the appointment: give the list to the admitting nurse. Prescriptions will be written only during appointments. No prescriptions will be written on procedure days. If you forget a medication: it will not be "Called in", "Faxed", or "electronically sent". You will need to get another appointment to get these prescribed. No early refills. Do not call asking to have your prescription filled early. Partial  or short prescriptions: Occasionally your pharmacy may not have enough pills to fill your prescription.  NEVER ACCEPT a partial fill or a prescription that is short of the total amount of pills that you were prescribed.  With controlled substances the law allows 72 hours for  the pharmacy to complete the prescription.  If the prescription is not completed within 72 hours, the pharmacist will require a new prescription to be written. This means that you will be short on your medicine and we WILL NOT send another prescription to complete your original   prescription.  Instead, request the pharmacy to send a carrier to a nearby branch to get enough medication to provide you with your full prescription. Prescription Accuracy: You are responsible for carefully inspecting your prescriptions before leaving our office. Have the discharge nurse carefully go over each prescription with you, before taking them home. Make sure that your name is accurately spelled, that your address is correct. Check the name and dose of your medication to make sure it is accurate. Check the number of pills, and the written instructions to make sure they are clear and accurate. Make sure that you are given enough medication to last until your next medication refill appointment. Taking Medication: Take medication as prescribed. When it comes to controlled substances, taking less pills or less frequently than prescribed is permitted and encouraged. Never take more pills than instructed. Never take the medication more frequently than prescribed.  Inform other Doctors: Always inform, all of your healthcare providers, of all the medications you take. Pain Medication from other Providers: You are not allowed to accept any additional pain medication from any other Doctor or Healthcare provider. There are two exceptions to this rule. (see below) In the event that you require additional pain medication, you are responsible for notifying us, as stated below. Cough Medicine: Often these contain an opioid, such as codeine or hydrocodone. Never accept or take cough medicine containing these opioids if you are already taking an opioid* medication. The combination may cause respiratory failure and death. Medication Agreement:  You are responsible for carefully reading and following our Medication Agreement. This must be signed before receiving any prescriptions from our practice. Safely store a copy of your signed Agreement. Violations to the Agreement will result in no further prescriptions. (Additional copies of our Medication Agreement are available upon request.) Laws, Rules, & Regulations: All patients are expected to follow all Federal and State Laws, Statutes, Rules, & Regulations. Ignorance of the Laws does not constitute a valid excuse.  Illegal drugs and Controlled Substances: The use of illegal substances (including, but not limited to marijuana and its derivatives) and/or the illegal use of any controlled substances is strictly prohibited. Violation of this rule may result in the immediate and permanent discontinuation of any and all prescriptions being written by our practice. The use of any illegal substances is prohibited. Adopted CDC guidelines & recommendations: Target dosing levels will be at or below 60 MME/day. Use of benzodiazepines** is not recommended.  Exceptions: There are only two exceptions to the rule of not receiving pain medications from other Healthcare Providers. Exception #1 (Emergencies): In the event of an emergency (i.e.: accident requiring emergency care), you are allowed to receive additional pain medication. However, you are responsible for: As soon as you are able, call our office [phone], at any time of the day or night, and leave a message stating your name, the date and nature of the emergency, and the name and dose of the medication prescribed. In the event that your call is answered by a member of our staff, make sure to document and save the date, time, and the name of the person that took your information.  Exception #2 (Planned Surgery): In the event that you are scheduled by another doctor or dentist to have any type of surgery or procedure, you are allowed (for a period no  longer than 30 days), to receive additional pain medication, for the acute post-op pain. However, in this case, you are responsible for picking up a copy of   our "Post-op Pain Management for Surgeons" handout, and giving it to your surgeon or dentist. This document is available at our office, and does not require an appointment to obtain it. Simply go to our office during business hours (Monday-Thursday from 8:00 AM to 4:00 PM) (Friday 8:00 AM to 12:00 Noon) or if you have a scheduled appointment with us, prior to your surgery, and ask for it by name. In addition, you are responsible for: calling our office [phone], at any time of the day or night, and leaving a message stating your name, name of your surgeon, type of surgery, and date of procedure or surgery. Failure to comply with your responsibilities may result in termination of therapy involving the controlled substances. Medication Agreement Violation. Following the above rules, including your responsibilities will help you in avoiding a Medication Agreement Violation ("Breaking your Pain Medication Contract").  Consequences:  Not following the above rules may result in permanent discontinuation of medication prescription therapy.  *Opioid medications include: morphine, codeine, oxycodone, oxymorphone, hydrocodone, hydromorphone, meperidine, tramadol, tapentadol, buprenorphine, fentanyl, methadone. **Benzodiazepine medications include: diazepam (Valium), alprazolam (Xanax), clonazepam (Klonopine), lorazepam (Ativan), clorazepate (Tranxene), chlordiazepoxide (Librium), estazolam (Prosom), oxazepam (Serax), temazepam (Restoril), triazolam (Halcion) (Last updated: 05/14/2022) ______________________________________________________________________    ______________________________________________________________________  Medication Recommendations and Reminders  Applies to: All patients receiving prescriptions (written and/or  electronic).  Medication Rules & Regulations: You are responsible for reading, knowing, and following our "Medication Rules" document. These exist for your safety and that of others. They are not flexible and neither are we. Dismissing or ignoring them is an act of "non-compliance" that may result in complete and irreversible termination of such medication therapy. For safety reasons, "non-compliance" will not be tolerated. As with the [place] fundamental legal principle of "ignorance of the law is no defense", we will accept no excuses for not having read and knowing the content of documents provided to you by our practice.  Pharmacy of record:  Definition: This is the pharmacy where your electronic prescriptions will be sent.  We do not endorse any particular pharmacy. It is up to you and your insurance to decide what pharmacy to use.  We do not restrict you in your choice of pharmacy. However, once we write for your prescriptions, we will NOT be re-sending more prescriptions to fix restricted supply problems created by your pharmacy, or your insurance.  The pharmacy listed in the electronic medical record should be the one where you want electronic prescriptions to be sent. If you choose to change pharmacy, simply notify our nursing staff. Changes will be made only during your regular appointments and not over the phone.  Recommendations: Keep all of your pain medications in a safe place, under lock and key, even if you live alone. We will NOT replace lost, stolen, or damaged medication. We do not accept "Police Reports" as proof of medications having been stolen. After you fill your prescription, take 1 week's worth of pills and put them away in a safe place. You should keep a separate, properly labeled bottle for this purpose. The remainder should be kept in the original bottle. Use this as your primary supply, until it runs out. Once it's gone, then you know that you have 1 week's worth of medicine,  and it is time to come in for a prescription refill. If you do this correctly, it is unlikely that you will ever run out of medicine. To make sure that the above recommendation works, it is very important that you make   sure your medication refill appointments are scheduled at least 1 week before you run out of medicine. To do this in an effective manner, make sure that you do not leave the office without scheduling your next medication management appointment. Always ask the nursing staff to show you in your prescription , when your medication will be running out. Then arrange for the receptionist to get you a return appointment, at least 7 days before you run out of medicine. Do not wait until you have 1 or 2 pills left, to come in. This is very poor planning and does not take into consideration that we may need to cancel appointments due to bad weather, sickness, or emergencies affecting our staff. DO NOT ACCEPT A "Partial Fill": If for any reason your pharmacy does not have enough pills/tablets to completely fill or refill your prescription, do not allow for a "partial fill". The law allows the pharmacy to complete that prescription within 72 hours, without requiring a new prescription. If they do not fill the rest of your prescription within those 72 hours, you will need a separate prescription to fill the remaining amount, which we will NOT provide. If the reason for the partial fill is your insurance, you will need to talk to the pharmacist about payment alternatives for the remaining tablets, but again, DO NOT ACCEPT A PARTIAL FILL, unless you can trust your pharmacist to obtain the remainder of the pills within 72 hours.  Prescription refills and/or changes in medication(s):  Prescription refills, and/or changes in dose or medication, will be conducted only during scheduled medication management appointments. (Applies to both, written and electronic prescriptions.) No refills on procedure days. No  medication will be changed or started on procedure days. No changes, adjustments, and/or refills will be conducted on a procedure day. Doing so will interfere with the diagnostic portion of the procedure. No phone refills. No medications will be "called into the pharmacy". No Fax refills. No weekend refills. No Holliday refills. No after hours refills.  Remember:  Business hours are:  Monday to Thursday 8:00 AM to 4:00 PM Provider's Schedule: Glennda Weatherholtz, MD - Appointments are:  Medication management: Monday and Wednesday 8:00 AM to 4:00 PM Procedure day: Tuesday and Thursday 7:30 AM to 4:00 PM Bilal Lateef, MD - Appointments are:  Medication management: Tuesday and Thursday 8:00 AM to 4:00 PM Procedure day: Monday and Wednesday 7:30 AM to 4:00 PM (Last update: 05/14/2022) ______________________________________________________________________    ____________________________________________________________________________________________  WARNING: CBD (cannabidiol) & Delta (Delta-8 tetrahydrocannabinol) products.   Applicable to:  All individuals currently taking or considering taking CBD (cannabidiol) and, more important, all patients taking opioid analgesic controlled substances (pain medication). (Example: oxycodone; oxymorphone; hydrocodone; hydromorphone; morphine; methadone; tramadol; tapentadol; fentanyl; buprenorphine; butorphanol; dextromethorphan; meperidine; codeine; etc.)  Introduction:  Recently there has been a drive towards the use of "natural" products for the treatment of different conditions, including pain anxiety and sleep disorders. Marijuana and hemp are two varieties of the cannabis genus plants. Marijuana and its derivatives are illegal, while hemp and its derivatives are not. Cannabidiol (CBD) and tetrahydrocannabinol (THC), are two natural compounds found in plants of the Cannabis genus. They can both be extracted from hemp or marijuana. Both compounds  interact with your body's endocannabinoid system in very different ways. CBD is associated with pain relief (analgesia) while THC is associated with the psychoactive effects ("the high") obtained from the use of marijuana products. There are two main types of THC: Delta-9, which comes from the marijuana   plant and it is illegal, and Delta-8, which comes from the hemp plant, and it is legal. (Both, Delta-9-THC and Delta-8-THC are psychoactive and give you "the high".)   Legality:  Marijuana and its derivatives: illegal Hemp and its derivatives: Legal (State dependent) UPDATE: (09/07/2021) The Drug Enforcement Agency (DEA) issued a letter stating that "delta" cannabinoids, including Delta-8-THCO and Delta-9-THCO, synthetically derived from hemp do not qualify as hemp and will be viewed as Schedule I drugs. (Schedule I drugs, substances, or chemicals are defined as drugs with no currently accepted medical use and a high potential for abuse. Some examples of Schedule I drugs are: heroin, lysergic acid diethylamide (LSD), marijuana (cannabis), 3,4-methylenedioxymethamphetamine (ecstasy), methaqualone, and peyote.) (https://www.dea.gov)  Legal status of CBD in [place]:  "Conditionally Legal"  Reference: "FDA Regulation of Cannabis and Cannabis-Derived Products, Including Cannabidiol (CBD)" - https://www.fda.gov/news-events/public-health-focus/fda-regulation-cannabis-and-cannabis-derived-products-including-cannabidiol-cbd  Warning:  CBD is not FDA approved and has not undergo the same manufacturing controls as prescription drugs.  This means that the purity and safety of available CBD may be questionable. Most of the time, despite manufacturer's claims, it is contaminated with THC (delta-9-tetrahydrocannabinol - the chemical in marijuana responsible for the "HIGH").  When this is the case, the THC contaminant will trigger a positive urine drug screen (UDS) test for Marijuana (carboxy-THC).   The FDA recently put  out a warning about 5 things that everyone should be aware of regarding Delta-8 THC: Delta-8 THC products have not been evaluated or approved by the FDA for safe use and may be marketed in ways that put the public health at risk. The FDA has received adverse event reports involving delta-8 THC-containing products. Delta-8 THC has psychoactive and intoxicating effects. Delta-8 THC manufacturing often involve use of potentially harmful chemicals to create the concentrations of delta-8 THC claimed in the marketplace. The final delta-8 THC product may have potentially harmful by-products (contaminants) due to the chemicals used in the process. Manufacturing of delta-8 THC products may occur in uncontrolled or unsanitary settings, which may lead to the presence of unsafe contaminants or other potentially harmful substances. Delta-8 THC products should be kept out of the reach of children and pets.  NOTE: Because a positive UDS for any illicit substance is a violation of our medication agreement, your opioid analgesics (pain medicine) may be permanently discontinued.  MORE ABOUT CBD  General Information: CBD was discovered in 1940 and it is a derivative of the cannabis sativa genus plants (Marijuana and Hemp). It is one of the 113 identified substances found in Marijuana. It accounts for up to 40% of the plant's extract. As of 2018, preliminary clinical studies on CBD included research for the treatment of anxiety, movement disorders, and pain. CBD is available and consumed in multiple forms, including inhalation of smoke or vapor, as an aerosol spray, and by mouth. It may be supplied as an oil containing CBD, capsules, dried cannabis, or as a liquid solution. CBD is thought not to be as psychoactive as THC (delta-9-tetrahydrocannabinol - the chemical in marijuana responsible for the "HIGH"). Studies suggest that CBD may interact with different biological target receptors in the body, including cannabinoid and  other neurotransmitter receptors. As of 2018 the mechanism of action for its biological effects has not been determined.  Side-effects  Adverse reactions: Dry mouth, diarrhea, decreased appetite, fatigue, drowsiness, malaise, weakness, sleep disturbances, and others.  Drug interactions:  CBD may interact with medications such as blood-thinners. CBD causes drowsiness on its own and it will increase drowsiness caused by other medications,   including antihistamines (such as Benadryl), benzodiazepines (Xanax, Ativan, Valium), antipsychotics, antidepressants, opioids, alcohol and supplements such as kava, melatonin and St. John's Wort.  Other drug interactions: Brivaracetam (Briviact); Caffeine; Carbamazepine (Tegretol); Citalopram (Celexa); Clobazam (Onfi); Eslicarbazepine (Aptiom); Everolimus (Zostress); Lithium; Methadone (Dolophine); Rufinamide (Banzel); Sedative medications (CNS depressants); Sirolimus (Rapamune); Stiripentol (Diacomit); Tacrolimus (Prograf); Tamoxifen ; Soltamox); Topiramate (Topamax); Valproate; Warfarin (Coumadin); Zonisamide. (Last update: 07/01/2022) ____________________________________________________________________________________________   ____________________________________________________________________________________________  Naloxone Nasal Spray  Why am I receiving this medication? [place] STOP ACT requires that all patients taking high dose opioids or at risk of opioids respiratory depression, be prescribed an opioid reversal agent, such as Naloxone (AKA: Narcan).  What is this medication? NALOXONE (nal OX one) treats opioid overdose, which causes slow or shallow breathing, severe drowsiness, or trouble staying awake. Call emergency services after using this medication. You may need additional treatment. Naloxone works by reversing the effects of opioids. It belongs to a group of medications called opioid blockers.  COMMON BRAND NAME(S): Kloxxado,  Narcan  What should I tell my care team before I take this medication? They need to know if you have any of these conditions: Heart disease Substance use disorder An unusual or allergic reaction to naloxone, other medications, foods, dyes, or preservatives Pregnant or trying to get pregnant Breast-feeding  When to use this medication? This medication is to be used for the treatment of respiratory depression (less than 8 breaths per minute) secondary to opioid overdose.   How to use this medication? This medication is for use in the nose. Lay the person on their back. Support their neck with your hand and allow the head to tilt back before giving the medication. The nasal spray should be given into 1 nostril. After giving the medication, move the person onto their side. Do not remove or test the nasal spray until ready to use. Get emergency medical help right away after giving the first dose of this medication, even if the person wakes up. You should be familiar with how to recognize the signs and symptoms of a narcotic overdose. If more doses are needed, give the additional dose in the other nostril. Talk to your care team about the use of this medication in children. While this medication may be prescribed for children as young as newborns for selected conditions, precautions do apply.  Naloxone Overdosage: If you think you have taken too much of this medicine contact a poison control center or emergency room at once.  NOTE: This medicine is only for you. Do not share this medicine with others.  What if I miss a dose? This does not apply.  What may interact with this medication? This is only used during an emergency. No interactions are expected during emergency use. This list may not describe all possible interactions. Give your health care provider a list of all the medicines, herbs, non-prescription drugs, or dietary supplements you use. Also tell them if you smoke, drink alcohol, or  use illegal drugs. Some items may interact with your medicine.  What should I watch for while using this medication? Keep this medication ready for use in the case of an opioid overdose. Make sure that you have the phone number of your care team and local hospital ready. You may need to have additional doses of this medication. Each nasal spray contains a single dose. Some emergencies may require additional doses. After use, bring the treated person to the nearest hospital or call 911. Make sure the treating care team knows that   the person has received a dose of this medication. You will receive additional instructions on what to do during and after use of this medication before an emergency occurs.  What side effects may I notice from receiving this medication? Side effects that you should report to your care team as soon as possible: Allergic reactions--skin rash, itching, hives, swelling of the face, lips, tongue, or throat Side effects that usually do not require medical attention (report these to your care team if they continue or are bothersome): Constipation Dryness or irritation inside the nose Headache Increase in blood pressure Muscle spasms Stuffy nose Toothache This list may not describe all possible side effects. Call your doctor for medical advice about side effects. You may report side effects to FDA at 1-800-FDA-1088.  Where should I keep my medication? Because this is an emergency medication, you should keep it with you at all times.  Keep out of the reach of children and pets. Store between 20 and 25 degrees C (68 and 77 degrees F). Do not freeze. Throw away any unused medication after the expiration date. Keep in original box until ready to use.  NOTE: This sheet is a summary. It may not cover all possible information. If you have questions about this medicine, talk to your doctor, pharmacist, or health care provider.   2023 Elsevier/Gold Standard (2021-03-16  00:00:00)  ____________________________________________________________________________________________   

## 2022-10-14 NOTE — Progress Notes (Signed)
Nursing Pain Medication Assessment:  Safety precautions to be maintained throughout the outpatient stay will include: orient to surroundings, keep bed in low position, maintain call bell within reach at all times, provide assistance with transfer out of bed and ambulation.  Medication Inspection Compliance: Pill count conducted under aseptic conditions, in front of the patient. Neither the pills nor the bottle was removed from the patient's sight at any time. Once count was completed pills were immediately returned to the patient in their original bottle.  Medication: Hydromorphone (Dilaudid) Pill/Patch Count:  8 of 90 pills remain Pill/Patch Appearance: Markings consistent with prescribed medication Bottle Appearance: Standard pharmacy container. Clearly labeled. Filled Date:  0 2 / 12 / 2024 Last Medication intake:  Today

## 2022-10-14 NOTE — Telephone Encounter (Signed)
Called pharmacies to cancel all previous prescriptions for Hydromorphone. There were none at [facility], there were two at [facility]

## 2022-10-15 ENCOUNTER — Encounter: Payer: Self-pay | Admitting: Pain Medicine

## 2022-10-15 DIAGNOSIS — R937 Abnormal findings on diagnostic imaging of other parts of musculoskeletal system: Secondary | ICD-10-CM | POA: Insufficient documentation

## 2022-10-18 LAB — TOXASSURE SELECT 13 (MW), URINE

## 2022-10-23 ENCOUNTER — Telehealth: Payer: Self-pay | Admitting: Cardiology

## 2022-10-23 DIAGNOSIS — I872 Venous insufficiency (chronic) (peripheral): Secondary | ICD-10-CM

## 2022-10-23 DIAGNOSIS — Z86718 Personal history of other venous thrombosis and embolism: Secondary | ICD-10-CM

## 2022-10-23 DIAGNOSIS — R29898 Other symptoms and signs involving the musculoskeletal system: Secondary | ICD-10-CM

## 2022-10-23 NOTE — Telephone Encounter (Signed)
Caller stated patient was referred to [facility] for [facility] and they do not do this procedure.  Caller wants call back to confirm referral.

## 2022-10-23 NOTE — Telephone Encounter (Signed)
Spoke with [facility] and advised on reason for referral. Referral has been updated. They will call the patient to schedule an appointment once referral is received.

## 2022-10-24 ENCOUNTER — Other Ambulatory Visit: Payer: Self-pay | Admitting: Pain Medicine

## 2022-10-24 DIAGNOSIS — G8929 Other chronic pain: Secondary | ICD-10-CM

## 2022-10-24 DIAGNOSIS — G63 Polyneuropathy in diseases classified elsewhere: Secondary | ICD-10-CM

## 2022-10-24 DIAGNOSIS — M792 Neuralgia and neuritis, unspecified: Secondary | ICD-10-CM

## 2022-10-28 ENCOUNTER — Ambulatory Visit: Payer: Medicare (Managed Care)

## 2022-10-29 ENCOUNTER — Ambulatory Visit: Payer: Medicare (Managed Care) | Attending: Internal Medicine

## 2022-11-25 ENCOUNTER — Emergency Department
Admission: EM | Admit: 2022-11-25 | Discharge: 2022-11-25 | Discharge status: Against Medical Advice | Payer: Medicare (Managed Care) | Attending: Emergency Medicine | Admitting: Emergency Medicine

## 2022-11-25 ENCOUNTER — Other Ambulatory Visit: Payer: Self-pay

## 2022-11-25 DIAGNOSIS — R1033 Periumbilical pain: Secondary | ICD-10-CM | POA: Diagnosis not present

## 2022-11-25 DIAGNOSIS — Z5321 Procedure and treatment not carried out due to patient leaving prior to being seen by health care provider: Secondary | ICD-10-CM | POA: Diagnosis not present

## 2022-11-25 DIAGNOSIS — R110 Nausea: Secondary | ICD-10-CM | POA: Insufficient documentation

## 2022-11-25 NOTE — ED Triage Notes (Signed)
Pt comes from [facility] with c/o lower belly pain for about a week. Pt states no D/V but does state nausea. Pt did go to [place] on Friday for [place] but left before it was done.

## 2022-11-25 NOTE — ED Notes (Addendum)
Patient refuses blood work, vital signs, and urine collection.  Patient states "I don't need that to get a CT scan".  Patient told NT previous statement.  Once reported to me I attempted to clarify for patient that VS and Labs were needed and discuss ED visit.  Patient had left the ED.

## 2022-12-03 ENCOUNTER — Other Ambulatory Visit: Payer: Self-pay | Admitting: Internal Medicine

## 2022-12-03 DIAGNOSIS — R1084 Generalized abdominal pain: Secondary | ICD-10-CM

## 2023-01-06 ENCOUNTER — Other Ambulatory Visit (INDEPENDENT_AMBULATORY_CARE_PROVIDER_SITE_OTHER): Payer: Self-pay | Admitting: Nurse Practitioner

## 2023-01-06 DIAGNOSIS — R29898 Other symptoms and signs involving the musculoskeletal system: Secondary | ICD-10-CM

## 2023-01-06 DIAGNOSIS — I872 Venous insufficiency (chronic) (peripheral): Secondary | ICD-10-CM

## 2023-01-07 ENCOUNTER — Encounter (INDEPENDENT_AMBULATORY_CARE_PROVIDER_SITE_OTHER): Payer: Medicare Other

## 2023-01-07 ENCOUNTER — Encounter (INDEPENDENT_AMBULATORY_CARE_PROVIDER_SITE_OTHER): Payer: Medicare Other | Admitting: Vascular Surgery

## 2023-01-07 NOTE — Progress Notes (Unsigned)
PROVIDER NOTE: Information contained herein reflects review and annotations entered in association with encounter. Interpretation of such information and data should be left to medically-trained personnel. Information provided to patient can be located elsewhere in the medical record under "Patient Instructions". Document created using STT-dictation technology, any transcriptional errors that may result from process are unintentional.    Patient: [name]  Service Category: E/M  Provider: Oswaldo Done, MD  DOB: 20-Jul-1946  DOS: 01/08/2023  Referring Provider: Lynnea Ferrier, MD  MRN: [id number]  Specialty: Interventional Pain Management  PCP: Lynnea Ferrier, MD  Type: Established Patient  Setting: Ambulatory outpatient    Location: Office  Delivery: Face-to-face     HPI  [name], a 77 y.o. year old male, is here today because of his Chronic pain syndrome [G89.4]. [name] primary complain today is No chief complaint on file.  Pertinent problems: 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); Chronic ankle pain (Bilateral); Pain in joint, ankle and foot; Polyneuropathy associated with underlying disease (HCC); Presence of intrathecal pump; Ankle sprain; Rotator cuff sprain; Lumbosacral radiculopathy at S1; Chronic feet pain (3ry area of Pain) (Bilateral) (R>L); Chronic knee pain (4th area of Pain) (Bilateral) (R>L); Failed back surgical syndrome; Lower extremity weakness (Bilateral); Chronic neuropathic pain; Peripheral neurogenic pain; Nonfamilial nocturnal leg cramps (Bilateral); Restless leg syndrome; Pain in right knee; DDD (degenerative disc disease), lumbosacral; Abnormal MRI, lumbar spine (02/12/2022);  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; Lumbar facet arthropathy (Multilevel) (Bilateral); Lumbosacral facet syndrome (Bilateral); Lumbar foraminal narrowing (Multilevel) (Bilateral); Cervicalgia; Chronic shoulder pain (Bilateral) (R>L); Radicular pain of shoulder; Occipital neuralgia (Right); Cervico-occipital neuralgia (Right); Cervical facet hypertrophy (Multilevel) (Bilateral); Cervical facet pain; Painful cervical range of motion; Chronic neurogenic pain; Left rib fracture; Chronic pain; Foot pain; Lumbago; 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 Pain Assessment: Severity of   is reported as a  /10. Location:    / . Onset:  . Quality:  . Timing:  . Modifying factor(s):  Marland Kitchen Vitals:  vitals were not taken for this visit.  BMI: Estimated body mass index is 25.62 kg/m as calculated from the following:   Height as of 10/14/22: 6\' 3"  (1.905 m).   Weight as of 10/14/22: 205 lb (93 kg). Last encounter: 10/14/2022. Last procedure: Visit date not found.  Reason for encounter: medication management. ***  RTCB: 04/13/2023   Pharmacotherapy Assessment  Analgesic: Hydromorphone 4 mg tablet, 1 tab p.o. 3 times daily MME/day: 60 mg/day    Monitoring: [place] PMP: PDMP reviewed during this encounter.       Pharmacotherapy: No side-effects or adverse reactions reported. Compliance: No problems identified. Effectiveness: Clinically acceptable.  No notes on file  No results found for: "CBDTHCR" No results found for: "D8THCCBX" No results found for: "D9THCCBX"  UDS:  Summary  Date Value  Ref Range Status  10/14/2022 Note  Final    Comment:    ==================================================================== [employer] Select 13 (MW) ====================================================================  Test                             Result       Flag       Units  Drug Present and Declared for Prescription Verification   Hydromorphone                  3914         EXPECTED   ng/mg creat    Hydromorphone may be administered as a scheduled prescription    medication; it is also an expected metabolite of hydrocodone.  ==================================================================== Test                      Result    Flag   Units      Ref Range   Creatinine              84               mg/dL      >=16 ==================================================================== Declared Medications:  The flagging and interpretation on this report are based on the  following declared medications.  Unexpected results may arise from  inaccuracies in the declared medications.   **Note: The testing scope of this panel includes these medications:   Hydromorphone (Dilaudid)   **Note: The testing scope of this panel does not include the  following reported medications:   Albuterol (Ventolin HFA)  Amitriptyline (Elavil)  Apixaban (Eliquis)  Aspirin  Azelastine (Astelin)  Bisacodyl  Clonidine (Catapres)  Duloxetine (Cymbalta)  Esomeprazole (Nexium)  Latanoprost (Xalatan)  Levothyroxine (Synthroid)  Loratadine (Claritin)  Losartan (Cozaar)  Mexiletine  Naloxone (Narcan)  Pregabalin (Lyrica)  Promethazine (Phenergan)  Simvastatin (Zocor)  Tizanidine (Zanaflex) ==================================================================== For clinical consultation, please call [phone]. ====================================================================       ROS  Constitutional: Denies any fever or chills Gastrointestinal: No reported hemesis, hematochezia,  vomiting, or acute GI distress Musculoskeletal: Denies any acute onset joint swelling, redness, loss of ROM, or weakness Neurological: No reported episodes of acute onset apraxia, aphasia, dysarthria, agnosia, amnesia, paralysis, loss of coordination, or loss of consciousness  Medication Review  DULoxetine, HYDROmorphone, albuterol, amitriptyline, apixaban, aspirin EC, azelastine, bisacodyl, cloNIDine, esomeprazole, latanoprost, levothyroxine, loratadine, losartan, mexiletine, naloxone, pregabalin, promethazine, simvastatin, and tiZANidine  History Review  Allergy: [name] is allergic to celebrex [celecoxib], codeine, penicillins, and entresto [sacubitril-valsartan]. Drug: [name]  reports no history of drug use. Alcohol:  reports no history of alcohol use. Tobacco:  reports that he has been smoking cigarettes. He has a 44.25 pack-year smoking history. He has never used smokeless tobacco. Social: [name]  reports that he has been smoking cigarettes. He has a 44.25 pack-year smoking history. He has never used smokeless tobacco. He reports that he does not drink alcohol and does not use drugs. Medical:  has a past medical history of Acute anterolateral wall MI (HCC) (2000), Acute non-Q wave non-ST elevation myocardial infarction (NSTEMI) (HCC) (12/03/2004), Adenomatous polyp of colon, Agent orange exposure, Angina pectoris (HCC), Anxiety, Aortic atherosclerosis (HCC), Asthma, Bladder stones, Bradycardia, CHF (congestive heart failure) (HCC), Chronic lower back pain, Chronic pain syndrome, COPD (chronic obstructive pulmonary disease) (HCC), Coronary artery disease, Depression, DJD (degenerative joint disease), DVT of lower extremity, bilateral (HCC), Dyspnea, GERD (gastroesophageal reflux disease), History of hiatal hernia, HLD (hyperlipidemia), Hypertension, Hypothyroidism, Inflammatory polyarthropathy (HCC), Internal hemorrhoids, Long term prescription opiate use, Malignant neoplasm of posterior wall  of bladder (HCC) (10/03/2015),  Neuropathy, NSVT (nonsustained ventricular tachycardia) (HCC) (12/04/2004), OSA (obstructive sleep apnea), Osteoarthritis, Osteoporosis, PAF (paroxysmal atrial fibrillation) (HCC), Paralysis of RIGHT diaphragm, Peripheral vascular disease (HCC), Rhabdomyolysis (2006), Senile purpura (HCC), Sepsis due to pneumonia (HCC) (10/28/2021), Stasis dermatitis of both legs, T2DM (type 2 diabetes mellitus) (HCC), and Therapeutic opioid-induced constipation (OIC). Surgical: [name]  has a past surgical history that includes Coronary angioplasty; Posterior lumbar fusion (N/A, 1999); Nissen fundoplication (N/A, 2000); Mastoidectomy; Partial colectomy (N/A); Intrathecal pump implantation (N/A, 03/2008); Intrathecal pump revision (N/A, 09/28/2014); Transurethral resection of bladder tumor (N/A, 03/07/2015); Colonoscopy with propofol (N/A, 02/27/2018); External ear surgery; Skin cancer excision; IVC FILTER PLACEMENT ([facility] HX) (N/A, 09/2007); IVC FILTER REMOVAL (N/A, 2011); Colonoscopy (N/A, 11/22/2013); Colonoscopy (N/A, 09/19/2008); Colonoscopy (N/A, 08/25/2003); Colonoscopy (N/A, 10/15/1994); Posterior laminectomy / decompression cervical spine (N/A, 1984); Posterior laminectomy / decompression cervical spine (N/A, 1985); Tonsillectomy (Bilateral, 1958); Coronary angioplasty with stent (Left, 12/04/2004); LEFT HEART CATH AND CORONARY ANGIOGRAPHY (Left, 02/19/2008); LEFT HEART CATH AND CORONARY ANGIOGRAPHY (Left, 04/18/1987); LEFT HEART CATH AND CORONARY ANGIOGRAPHY (Left, 02/03/2002); and Pain pump removal (N/A, 12/20/2021). Family: family history includes Emphysema in his father and sister; Heart attack in his father; Heart disease in his brother and mother.  Laboratory Chemistry Profile   Renal Lab Results  Component Value Date   BUN 10 01/30/2022   CREATININE 0.91 01/30/2022   BCR 11 01/30/2022   GFRAA >60 03/02/2015   GFRNONAA >60 11/01/2021    Hepatic Lab Results  Component Value  Date   AST 31 01/30/2022   ALBUMIN 4.0 01/30/2022   ALKPHOS 96 01/30/2022    Electrolytes Lab Results  Component Value Date   NA 145 (H) 01/30/2022   K 5.2 01/30/2022   CL 104 01/30/2022   CALCIUM 9.6 01/30/2022   MG 2.3 11/01/2021   PHOS 3.7 11/01/2021    Bone Lab Results  Component Value Date   25OHVITD1 22 (L) 02/07/2021   25OHVITD2 <1.0 02/07/2021   25OHVITD3 22 02/07/2021    Inflammation (CRP: Acute Phase) (ESR: Chronic Phase) Lab Results  Component Value Date   CRP 5 01/30/2022   ESRSEDRATE 11 01/30/2022   LATICACIDVEN 0.9 10/28/2021         Note: Above Lab results reviewed.  Recent Imaging Review  MR CERVICAL SPINE WO CONTRAST CLINICAL DATA:  Neck pain  EXAM: MRI CERVICAL SPINE WITHOUT CONTRAST  TECHNIQUE: Multiplanar, multisequence MR imaging of the cervical spine was performed. No intravenous contrast was administered.  COMPARISON:  MRI C Spine 10/01/21  FINDINGS: Alignment: Straightening of the normal cervical lordosis.  Vertebrae: No fracture, evidence of discitis, or bone lesion.  Cord: Normal signal and morphology.  Posterior Fossa, vertebral arteries, paraspinal tissues: Negative.  Disc levels:  C1-C2: Mild degenerative change.  C2-C3: Moderate bilateral facet degenerative change. Minimal circumferential disc bulge. Mild spinal canal narrowing. Moderate bilateral neural foraminal narrowing. Findings are unchanged from prior exam.  C3-C4: Severe bilateral facet degenerative change, right-greater-than-left. Minimal disc bulge. Severe bilateral neural foraminal narrowing. Moderate spinal canal narrowing. The degree of neural foraminal narrowing has progressed compared to 2020.  C4-C5: Severe bilateral facet degenerative change. Minimal disc bulge. There is deformation of the ventral spinal cord. Moderate spinal canal narrowing. Severe bilateral neural foraminal narrowing. The degree of spinal canal and neural foraminal stenosis is  slightly progressed compared to 2020.  C5-C6: Severe disc space loss. Circumferential disc bulge. Moderate spinal canal narrowing. Moderate bilateral facet degenerative change. Uncovertebral hypertrophy. Severe bilateral neural foraminal narrowing. Findings are unchanged compared to  2020.  C6-C7: Severe disc space loss. Right paracentral disc protrusion. Severe spinal canal narrowing. Severe bilateral neural foraminal narrowing. Findings are unchanged compared to prior exam.  C7-T1: Severe bilateral facet degenerative change. Uncovertebral hypertrophy. Moderate to severe spinal canal stenosis. Moderate bilateral neural foraminal stenosis. Findings are unchanged compared to prior exam.  IMPRESSION: 1. Redemonstrated severe multilevel degenerative changes most notably at C6-C7 where there is moderate to severe spinal canal stenosis, unchanged compared to prior exam. Moderate spinal canal stenosis is also present at C3-C4, C4-C5, and C7-T1. The degree of spinal canal stenosis is slightly progressed at C4-C5. 2. Combination of severe facet degenerative change and uncovertebral hypertrophy results in severe neural foraminal stenosis at C3-C7. The degree of neural foraminal stenosis has slightly progressed at C3-C4 compared to 2023.  Electronically Signed   By: Lorenza Cambridge M.D.   On: 10/15/2022 09:53 Note: Reviewed        Physical Exam  General appearance: Well nourished, well developed, and well hydrated. In no apparent acute distress Mental status: Alert, oriented x 3 (person, place, & time)       Respiratory: No evidence of acute respiratory distress Eyes: PERLA Vitals: There were no vitals taken for this visit. BMI: Estimated body mass index is 25.62 kg/m as calculated from the following:   Height as of 10/14/22: 6\' 3"  (1.905 m).   Weight as of 10/14/22: 205 lb (93 kg). Ideal: Patient weight not recorded  Assessment   Diagnosis Status  1. Chronic pain syndrome   2.  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 Chronic lower extremity pain (2ry area of Pain) (Bilateral) (R>L)   4. Chronic feet pain (3ry area of Pain) (Bilateral) (R>L)   5. Chronic knee pain (4th area of Pain) (Bilateral) (R>L)   6. Lumbar post-laminectomy syndrome   7. Failed back surgical syndrome   8. Pharmacologic therapy   9. Chronic use of opiate for therapeutic purpose   10. Encounter for medication management   11. Encounter for chronic pain management    Controlled Controlled Controlled   Updated Problems: No problems updated.  Plan of Care  Problem-specific:  No problem-specific Assessment & Plan notes found for this encounter.  [name] has a current medication list which includes the following long-term medication(s): albuterol, amitriptyline, azelastine, duloxetine, eliquis, esomeprazole, hydromorphone, levothyroxine, loratadine, losartan, mexiletine, pregabalin, and simvastatin.  Pharmacotherapy (Medications Ordered): No orders of the defined types were placed in this encounter.  Orders:  No orders of the defined types were placed in this encounter.  Follow-up plan:   No follow-ups on file.      Interventional Therapies  Risk Factors  Complex Considerations:  Anticoagulation: Eliquis (Stop: 3 days  Restart: 6 hrs)  T2IDDM  CAD  CHF  COPD  Hx. Nephrolithiasis  A-Fib  HTN  IBS  Hx. NSTEMI MI  Hx DVT  Depression/Anxiety    Planned  Pending:   Therapeutic left caudal ESI #2    Under consideration:   Diagnostic/therapeutic bilateral lumbar facet MBB #1  Therapeutic/palliative left L4-5 LESI #1    Completed:   Diagnostic intrathecal pump catheter test/evaluation x1 (06/21/2021) (Dx: Catheter malfunction/dysfunction)  Intrathecal catheter and pump removal by me (12/20/2021) (2ry to end of battery life & catheter malfunction)  Diagnostic left caudal ESI and epidurogram x1 (04/24/2021) (LBP: 50/50/0   LEP; 100/100/50-60)    Completed by other providers:   Referral (02/07/2021 & 09/19/2021) to [facility] neurosurgery (  Lucy Chris, MD) for ITP replacement.  Cardiology clearance to Dr. Juliann Pares requested (09/19/2021)    Therapeutic  Palliative (PRN) options:   None established   Pharmacotherapy  Nonopioids transferred (03/20/2022): Pregabalin (Lyrica) 75 mg TID.        Recent Visits Date Type Provider Dept  10/14/22 Office Visit Delano Metz, MD [facility]  Showing recent visits within past 90 days and meeting all other requirements Future Appointments Date Type Provider Dept  01/08/23 Appointment Delano Metz, MD [facility]  Showing future appointments within next 90 days and meeting all other requirements  I discussed the assessment and treatment plan with the patient. The patient was provided an opportunity to ask questions and all were answered. The patient agreed with the plan and demonstrated an understanding of the instructions.  Patient advised to call back or seek an in-person evaluation if the symptoms or condition worsens.  Duration of encounter: *** minutes.  Total time on encounter, as per AMA guidelines included both the face-to-face and non-face-to-face time personally spent by the physician and/or other qualified health care professional(s) on the day of the encounter (includes time in activities that require the physician or other qualified health care professional and does not include time in activities normally performed by clinical staff). Physician's time may include the following activities when performed: Preparing to see the patient (e.g., pre-charting review of records, searching for previously ordered imaging, lab work, and nerve conduction tests) Review of prior analgesic pharmacotherapies. Reviewing PMP Interpreting ordered tests (e.g., lab work, imaging, nerve conduction tests) Performing post-procedure evaluations, including  interpretation of diagnostic procedures Obtaining and/or reviewing separately obtained history Performing a medically appropriate examination and/or evaluation Counseling and educating the patient/family/caregiver Ordering medications, tests, or procedures Referring and communicating with other health care professionals (when not separately reported) Documenting clinical information in the electronic or other health record Independently interpreting results (not separately reported) and communicating results to the patient/ family/caregiver Care coordination (not separately reported)  Note by: Oswaldo Done, MD Date: 01/08/2023; Time: 8:22 AM

## 2023-01-07 NOTE — Patient Instructions (Signed)
____________________________________________________________________________________________  Opioid Pain Medication Update  To: All patients taking opioid pain medications. (I.e.: hydrocodone, hydromorphone, oxycodone, oxymorphone, morphine, codeine, methadone, tapentadol, tramadol, buprenorphine, fentanyl, etc.)  Re: Updated review of side effects and adverse reactions of opioid analgesics, as well as new information about long term effects of this class of medications.  Direct risks of long-term opioid therapy are not limited to opioid addiction and overdose. Potential medical risks include serious fractures, breathing problems during sleep, hyperalgesia, immunosuppression, chronic constipation, bowel obstruction, myocardial infarction, and tooth decay secondary to xerostomia.  Unpredictable adverse effects that can occur even if you take your medication correctly: Cognitive impairment, respiratory depression, and death. Most people think that if they take their medication "correctly", and "as instructed", that they will be safe. Nothing could be farther from the truth. In reality, a significant amount of recorded deaths associated with the use of opioids has occurred in individuals that had taken the medication for a long time, and were taking their medication correctly. The following are examples of how this can happen: Patient taking his/her medication for a long time, as instructed, without any side effects, is given a certain antibiotic or another unrelated medication, which in turn triggers a "Drug-to-drug interaction" leading to disorientation, cognitive impairment, impaired reflexes, respiratory depression or an untoward event leading to serious bodily harm or injury, including death.  Patient taking his/her medication for a long time, as instructed, without any side effects, develops an acute impairment of liver and/or kidney function. This will lead to a rapid inability of the body to  breakdown and eliminate their pain medication, which will result in effects similar to an "overdose", but with the same medicine and dose that they had always taken. This again may lead to disorientation, cognitive impairment, impaired reflexes, respiratory depression or an untoward event leading to serious bodily harm or injury, including death.  A similar problem will occur with patients as they grow older and their liver and kidney function begins to decrease as part of the aging process.  Background information: Historically, the original case for using long-term opioid therapy to treat chronic noncancer pain was based on safety assumptions that subsequent experience has called into question. In 1996, the American Pain Society and the American Academy of Pain Medicine issued a consensus statement supporting long-term opioid therapy. This statement acknowledged the dangers of opioid prescribing but concluded that the risk for addiction was low; respiratory depression induced by opioids was short-lived, occurred mainly in opioid-naive patients, and was antagonized by pain; tolerance was not a common problem; and efforts to control diversion should not constrain opioid prescribing. This has now proven to be wrong. Experience regarding the risks for opioid addiction, misuse, and overdose in community practice has failed to support these assumptions.  According to the Centers for Disease Control and Prevention, fatal overdoses involving opioid analgesics have increased sharply over the past decade. Currently, more than 96,700 people die from drug overdoses every year. Opioids are a factor in 7 out of every 10 overdose deaths. Deaths from drug overdose have surpassed motor vehicle accidents as the leading cause of death for individuals between the ages of 35 and 54.  Clinical data suggest that neuroendocrine dysfunction may be very common in both men and women, potentially causing hypogonadism, erectile  dysfunction, infertility, decreased libido, osteoporosis, and depression. Recent studies linked higher opioid dose to increased opioid-related mortality. Controlled observational studies reported that long-term opioid therapy may be associated with increased risk for cardiovascular events. Subsequent meta-analysis concluded   that the safety of long-term opioid therapy in elderly patients has not been proven.   Side Effects and adverse reactions: Common side effects: Drowsiness (sedation). Dizziness. Nausea and vomiting. Constipation. Physical dependence -- Dependence often manifests with withdrawal symptoms when opioids are discontinued or decreased. Tolerance -- As you take repeated doses of opioids, you require increased medication to experience the same effect of pain relief. Respiratory depression -- This can occur in healthy people, especially with higher doses. However, people with COPD, asthma or other lung conditions may be even more susceptible to fatal respiratory impairment.  Uncommon side effects: An increased sensitivity to feeling pain and extreme response to pain (hyperalgesia). Chronic use of opioids can lead to this. Delayed gastric emptying (the process by which the contents of your stomach are moved into your small intestine). Muscle rigidity. Immune system and hormonal dysfunction. Quick, involuntary muscle jerks (myoclonus). Arrhythmia. Itchy skin (pruritus). Dry mouth (xerostomia).  Long-term side effects: Chronic constipation. Sleep-disordered breathing (SDB). Increased risk of bone fractures. Hypothalamic-pituitary-adrenal dysregulation. Increased risk of overdose.  RISKS: Fractures and Falls:  Opioids increase the risk and incidence of falls. This is of particular importance in elderly patients.  Endocrine System:  Long-term administration is associated with endocrine abnormalities (endocrinopathies). (Also known as Opioid-induced Endocrinopathy) Influences  on both the hypothalamic-pituitary-adrenal axis?and the hypothalamic-pituitary-gonadal axis have been demonstrated with consequent hypogonadism and adrenal insufficiency in both sexes. Hypogonadism and decreased levels of dehydroepiandrosterone sulfate have been reported in men and women. Endocrine effects include: Amenorrhoea in women (abnormal absence of menstruation) Reduced libido in both sexes Decreased sexual function Erectile dysfunction in men Hypogonadisms (decreased testicular function with shrinkage of testicles) Infertility Depression and fatigue Loss of muscle mass Anxiety Depression Immune suppression Hyperalgesia Weight gain Anemia Osteoporosis Patients (particularly women of childbearing age) should avoid opioids. There is insufficient evidence to recommend routine monitoring of asymptomatic patients taking opioids in the long-term for hormonal deficiencies.  Immune System: Human studies have demonstrated that opioids have an immunomodulating effect. These effects are mediated via opioid receptors both on immune effector cells and in the central nervous system. Opioids have been demonstrated to have adverse effects on antimicrobial response and anti-tumour surveillance. Buprenorphine has been demonstrated to have no impact on immune function.  Opioid Induced Hyperalgesia: Human studies have demonstrated that prolonged use of opioids can lead to a state of abnormal pain sensitivity, sometimes called opioid induced hyperalgesia (OIH). Opioid induced hyperalgesia is not usually seen in the absence of tolerance to opioid analgesia. Clinically, hyperalgesia may be diagnosed if the patient on long-term opioid therapy presents with increased pain. This might be qualitatively and anatomically distinct from pain related to disease progression or to breakthrough pain resulting from development of opioid tolerance. Pain associated with hyperalgesia tends to be more diffuse than the  pre-existing pain and less defined in quality. Management of opioid induced hyperalgesia requires opioid dose reduction.  Cancer: Chronic opioid therapy has been associated with an increased risk of cancer among noncancer patients with chronic pain. This association was more evident in chronic strong opioid users. Chronic opioid consumption causes significant pathological changes in the small intestine and colon. Epidemiological studies have found that there is a link between opium dependence and initiation of gastrointestinal cancers. Cancer is the second leading cause of death after cardiovascular disease. Chronic use of opioids can cause multiple conditions such as GERD, immunosuppression and renal damage as well as carcinogenic effects, which are associated with the incidence of cancers.   Mortality: Long-term opioid use   has been associated with increased mortality among patients with chronic non-cancer pain (CNCP).  Prescription of long-acting opioids for chronic noncancer pain was associated with a significantly increased risk of all-cause mortality, including deaths from causes other than overdose.  Reference: Von Korff M, Kolodny A, Deyo RA, Chou R. Long-term opioid therapy reconsidered. Ann Intern Med. 2011 Sep 6;155(5):325-8. doi: 10.7326/[id number]. PMID: 21893626; PMCID: PMC3280085. Bedson J, Chen Y, Ashworth J, Hayward RA, Dunn KM, Jordan KP. Risk of adverse events in patients prescribed long-term opioids: A cohort study in the [place] Clinical Practice Research Datalink. Eur J Pain. 2019 May;23(5):908-922. doi: 10.1002/ejp.1357. Epub 2019 Jan 31. PMID: 30620116. Colameco S, Coren JS, Ciervo CA. Continuous opioid treatment for chronic noncancer pain: a time for moderation in prescribing. Postgrad Med. 2009 Jul;121(4):61-6. doi: 10.3810/pgm.2009.07.2032. PMID: 19641271. Chou R, Turner JA, Devine EB, Hansen RN, Sullivan SD, Blazina I, Dana T, Bougatsos C, Deyo RA. The  effectiveness and risks of long-term opioid therapy for chronic pain: a systematic review for a National Institutes of Health Pathways to Prevention Workshop. Ann Intern Med. 2015 Feb 17;162(4):276-86. doi: 10.7326/M14-2559. PMID: 25581257. Warner M, Chen LH, Makuc DM. NCHS Data Brief No. 22. [place]: Centers for Disease Control and Prevention; 2009. Sep, Increase in Fatal Poisonings Involving Opioid Analgesics in the [place], 1999-2006. Song IA, Choi HR, Oh TK. Long-term opioid use and mortality in patients with chronic non-cancer pain: Ten-year follow-up study in [place] from 2010 through 2019. EClinicalMedicine. 2022 Jul 18;51:101558. doi: 10.1016/j.eclinm.2022.101558. PMID: 35875817; PMCID: PMC9304910. Huser, W., Schubert, T., Vogelmann, T. et al. All-cause mortality in patients with long-term opioid therapy compared with non-opioid analgesics for chronic non-cancer pain: a database study. BMC Med 18, 162 (2020). https://doi.org/10.1186/[email or web address] Rashidian H, Zendehdel K, Kamangar F, Malekzadeh R, Haghdoost AA. An Ecological Study of the Association between Opiate Use and Incidence of Cancers. Addict Health. 2016 Fall;8(4):252-260. PMID: 28819556; PMCID: PMC5554805.  Our Goal: Our goal is to control your pain with means other than the use of opioid pain medications.  Our Recommendation: Talk to your physician about coming off of these medications. We can assist you with the tapering down and stopping these medicines. Based on the new information, even if you cannot completely stop the medication, a decrease in the dose may be associated with a lesser risk. Ask for other means of controlling the pain. Decrease or eliminate those factors that significantly contribute to your pain such as smoking, obesity, and a diet heavily tilted towards "inflammatory" nutrients.  Last Updated: 09/18/2022    ____________________________________________________________________________________________     ____________________________________________________________________________________________  Transfer of Pain Medication between Pharmacies  Re: 2023 DEA Clarification on existing regulation  Published on DEA Website: March 22, 2022  Title: Revised Regulation Allows DEA-Registered Pharmacies to Transfer Electronic Prescriptions at a Patient's Request DEA Headquarters Division - Public Information Office  "Patients now have the ability to request their electronic prescription be transferred to another pharmacy without having to go back to their practitioner to initiate the request. This revised regulation went into effect on Monday, March 18, 2022.     At a patient's request, a DEA-registered retail pharmacy can now transfer an electronic prescription for a controlled substance (schedules II-V) to another DEA-registered retail pharmacy. Prior to this change, patients would have to go through their practitioner to cancel their prescription and have it re-issued to a different pharmacy. The process was taxing and time consuming for both patients and practitioners.    The Drug Enforcement Administration (DEA) published its intent to revise the   process for transferring electronic prescriptions on June 09, 2020.  The final rule was published in the federal register on February 14, 2022 and went into effect 30 days later.  Under the final rule, a prescription can only be transferred once between pharmacies, and only if allowed under existing state or other applicable law. The prescription must remain in its electronic form; may not be altered in any way; and the transfer must be communicated directly between two licensed pharmacists. It's important to note, any authorized refills transfer with the original prescription, which means the entire prescription will be filled at the same pharmacy."     REFERENCES: 1. DEA website announcement https://www.dea.gov/stories/2023/2023-03/2022-09-01/revised-regulation-allows-dea-registered-pharmacies-transfer  2. Department of Justice website  https://www.govinfo.gov/content/pkg/FR-2022-02-14/pdf/2023-15847.pdf  3. DEPARTMENT OF JUSTICE Drug Enforcement Administration 21 CFR Part 1306 [Docket No. DEA-637] RIN [id number] "Transfer of Electronic Prescriptions for Schedules II-V Controlled Substances Between Pharmacies for Initial Filling"  ____________________________________________________________________________________________     _______________________________________________________________________  Medication Rules  Purpose: To inform patients, and their family members, of our medication rules and regulations.  Applies to: All patients receiving prescriptions from our practice (written or electronic).  Pharmacy of record: This is the pharmacy where your electronic prescriptions will be sent. Make sure we have the correct one.  Electronic prescriptions: In compliance with the [place] Strengthen Opioid Misuse Prevention (STOP) Act of 2017 (Session Law 2017-74/H243), effective July 22, 2018, all controlled substances must be electronically prescribed. Written prescriptions, faxing, or calling prescriptions to a pharmacy will no longer be done.  Prescription refills: These will be provided only during in-person appointments. No medications will be renewed without a "face-to-face" evaluation with your provider. Applies to all prescriptions.  NOTE: The following applies primarily to controlled substances (Opioid* Pain Medications).   Type of encounter (visit): For patients receiving controlled substances, face-to-face visits are required. (Not an option and not up to the patient.)  Patient's responsibilities: Pain Pills: Bring all pain pills to every appointment (except for procedure appointments). Pill Bottles: Bring  pills in original pharmacy bottle. Bring bottle, even if empty. Always bring the bottle of the most recent fill.  Medication refills: You are responsible for knowing and keeping track of what medications you are taking and when is it that you will need a refill. The day before your appointment: write a list of all prescriptions that need to be refilled. The day of the appointment: give the list to the admitting nurse. Prescriptions will be written only during appointments. No prescriptions will be written on procedure days. If you forget a medication: it will not be "Called in", "Faxed", or "electronically sent". You will need to get another appointment to get these prescribed. No early refills. Do not call asking to have your prescription filled early. Partial  or short prescriptions: Occasionally your pharmacy may not have enough pills to fill your prescription.  NEVER ACCEPT a partial fill or a prescription that is short of the total amount of pills that you were prescribed.  With controlled substances the law allows 72 hours for the pharmacy to complete the prescription.  If the prescription is not completed within 72 hours, the pharmacist will require a new prescription to be written. This means that you will be short on your medicine and we WILL NOT send another prescription to complete your original prescription.  Instead, request the pharmacy to send a carrier to a nearby branch to get enough medication to provide you with your full prescription. Prescription Accuracy: You are responsible for carefully inspecting your   prescriptions before leaving our office. Have the discharge nurse carefully go over each prescription with you, before taking them home. Make sure that your name is accurately spelled, that your address is correct. Check the name and dose of your medication to make sure it is accurate. Check the number of pills, and the written instructions to make sure they are clear and accurate. Make  sure that you are given enough medication to last until your next medication refill appointment. Taking Medication: Take medication as prescribed. When it comes to controlled substances, taking less pills or less frequently than prescribed is permitted and encouraged. Never take more pills than instructed. Never take the medication more frequently than prescribed.  Inform other Doctors: Always inform, all of your healthcare providers, of all the medications you take. Pain Medication from other Providers: You are not allowed to accept any additional pain medication from any other Doctor or Healthcare provider. There are two exceptions to this rule. (see below) In the event that you require additional pain medication, you are responsible for notifying us, as stated below. Cough Medicine: Often these contain an opioid, such as codeine or hydrocodone. Never accept or take cough medicine containing these opioids if you are already taking an opioid* medication. The combination may cause respiratory failure and death. Medication Agreement: You are responsible for carefully reading and following our Medication Agreement. This must be signed before receiving any prescriptions from our practice. Safely store a copy of your signed Agreement. Violations to the Agreement will result in no further prescriptions. (Additional copies of our Medication Agreement are available upon request.) Laws, Rules, & Regulations: All patients are expected to follow all Federal and State Laws, Statutes, Rules, & Regulations. Ignorance of the Laws does not constitute a valid excuse.  Illegal drugs and Controlled Substances: The use of illegal substances (including, but not limited to marijuana and its derivatives) and/or the illegal use of any controlled substances is strictly prohibited. Violation of this rule may result in the immediate and permanent discontinuation of any and all prescriptions being written by our practice. The use of  any illegal substances is prohibited. Adopted CDC guidelines & recommendations: Target dosing levels will be at or below 60 MME/day. Use of benzodiazepines** is not recommended.  Exceptions: There are only two exceptions to the rule of not receiving pain medications from other Healthcare Providers. Exception #1 (Emergencies): In the event of an emergency (i.e.: accident requiring emergency care), you are allowed to receive additional pain medication. However, you are responsible for: As soon as you are able, call our office [phone], at any time of the day or night, and leave a message stating your name, the date and nature of the emergency, and the name and dose of the medication prescribed. In the event that your call is answered by a member of our staff, make sure to document and save the date, time, and the name of the person that took your information.  Exception #2 (Planned Surgery): In the event that you are scheduled by another doctor or dentist to have any type of surgery or procedure, you are allowed (for a period no longer than 30 days), to receive additional pain medication, for the acute post-op pain. However, in this case, you are responsible for picking up a copy of our "Post-op Pain Management for Surgeons" handout, and giving it to your surgeon or dentist. This document is available at our office, and does not require an appointment to obtain it. Simply go to   our office during business hours (Monday-Thursday from 8:00 AM to 4:00 PM) (Friday 8:00 AM to 12:00 Noon) or if you have a scheduled appointment with us, prior to your surgery, and ask for it by name. In addition, you are responsible for: calling our office [phone], at any time of the day or night, and leaving a message stating your name, name of your surgeon, type of surgery, and date of procedure or surgery. Failure to comply with your responsibilities may result in termination of therapy involving the controlled  substances. Medication Agreement Violation. Following the above rules, including your responsibilities will help you in avoiding a Medication Agreement Violation ("Breaking your Pain Medication Contract").  Consequences:  Not following the above rules may result in permanent discontinuation of medication prescription therapy.  *Opioid medications include: morphine, codeine, oxycodone, oxymorphone, hydrocodone, hydromorphone, meperidine, tramadol, tapentadol, buprenorphine, fentanyl, methadone. **Benzodiazepine medications include: diazepam (Valium), alprazolam (Xanax), clonazepam (Klonopine), lorazepam (Ativan), clorazepate (Tranxene), chlordiazepoxide (Librium), estazolam (Prosom), oxazepam (Serax), temazepam (Restoril), triazolam (Halcion) (Last updated: 05/14/2022) ______________________________________________________________________    ______________________________________________________________________  Medication Recommendations and Reminders  Applies to: All patients receiving prescriptions (written and/or electronic).  Medication Rules & Regulations: You are responsible for reading, knowing, and following our "Medication Rules" document. These exist for your safety and that of others. They are not flexible and neither are we. Dismissing or ignoring them is an act of "non-compliance" that may result in complete and irreversible termination of such medication therapy. For safety reasons, "non-compliance" will not be tolerated. As with the [place] fundamental legal principle of "ignorance of the law is no defense", we will accept no excuses for not having read and knowing the content of documents provided to you by our practice.  Pharmacy of record:  Definition: This is the pharmacy where your electronic prescriptions will be sent.  We do not endorse any particular pharmacy. It is up to you and your insurance to decide what pharmacy to use.  We do not restrict you in your choice of  pharmacy. However, once we write for your prescriptions, we will NOT be re-sending more prescriptions to fix restricted supply problems created by your pharmacy, or your insurance.  The pharmacy listed in the electronic medical record should be the one where you want electronic prescriptions to be sent. If you choose to change pharmacy, simply notify our nursing staff. Changes will be made only during your regular appointments and not over the phone.  Recommendations: Keep all of your pain medications in a safe place, under lock and key, even if you live alone. We will NOT replace lost, stolen, or damaged medication. We do not accept "Police Reports" as proof of medications having been stolen. After you fill your prescription, take 1 week's worth of pills and put them away in a safe place. You should keep a separate, properly labeled bottle for this purpose. The remainder should be kept in the original bottle. Use this as your primary supply, until it runs out. Once it's gone, then you know that you have 1 week's worth of medicine, and it is time to come in for a prescription refill. If you do this correctly, it is unlikely that you will ever run out of medicine. To make sure that the above recommendation works, it is very important that you make sure your medication refill appointments are scheduled at least 1 week before you run out of medicine. To do this in an effective manner, make sure that you do not leave the office without   scheduling your next medication management appointment. Always ask the nursing staff to show you in your prescription , when your medication will be running out. Then arrange for the receptionist to get you a return appointment, at least 7 days before you run out of medicine. Do not wait until you have 1 or 2 pills left, to come in. This is very poor planning and does not take into consideration that we may need to cancel appointments due to bad weather, sickness, or emergencies  affecting our staff. DO NOT ACCEPT A "Partial Fill": If for any reason your pharmacy does not have enough pills/tablets to completely fill or refill your prescription, do not allow for a "partial fill". The law allows the pharmacy to complete that prescription within 72 hours, without requiring a new prescription. If they do not fill the rest of your prescription within those 72 hours, you will need a separate prescription to fill the remaining amount, which we will NOT provide. If the reason for the partial fill is your insurance, you will need to talk to the pharmacist about payment alternatives for the remaining tablets, but again, DO NOT ACCEPT A PARTIAL FILL, unless you can trust your pharmacist to obtain the remainder of the pills within 72 hours.  Prescription refills and/or changes in medication(s):  Prescription refills, and/or changes in dose or medication, will be conducted only during scheduled medication management appointments. (Applies to both, written and electronic prescriptions.) No refills on procedure days. No medication will be changed or started on procedure days. No changes, adjustments, and/or refills will be conducted on a procedure day. Doing so will interfere with the diagnostic portion of the procedure. No phone refills. No medications will be "called into the pharmacy". No Fax refills. No weekend refills. No Holliday refills. No after hours refills.  Remember:  Business hours are:  Monday to Thursday 8:00 AM to 4:00 PM Provider's Schedule: Larrell Rapozo, MD - Appointments are:  Medication management: Monday and Wednesday 8:00 AM to 4:00 PM Procedure day: Tuesday and Thursday 7:30 AM to 4:00 PM Bilal Lateef, MD - Appointments are:  Medication management: Tuesday and Thursday 8:00 AM to 4:00 PM Procedure day: Monday and Wednesday 7:30 AM to 4:00 PM (Last update: 05/14/2022) ______________________________________________________________________    ____________________________________________________________________________________________  Naloxone Nasal Spray  Why am I receiving this medication? [place] STOP ACT requires that all patients taking high dose opioids or at risk of opioids respiratory depression, be prescribed an opioid reversal agent, such as Naloxone (AKA: Narcan).  What is this medication? NALOXONE (nal OX one) treats opioid overdose, which causes slow or shallow breathing, severe drowsiness, or trouble staying awake. Call emergency services after using this medication. You may need additional treatment. Naloxone works by reversing the effects of opioids. It belongs to a group of medications called opioid blockers.  COMMON BRAND NAME(S): Kloxxado, Narcan  What should I tell my care team before I take this medication? They need to know if you have any of these conditions: Heart disease Substance use disorder An unusual or allergic reaction to naloxone, other medications, foods, dyes, or preservatives Pregnant or trying to get pregnant Breast-feeding  When to use this medication? This medication is to be used for the treatment of respiratory depression (less than 8 breaths per minute) secondary to opioid overdose.   How to use this medication? This medication is for use in the nose. Lay the person on their back. Support their neck with your hand and allow the head to tilt   back before giving the medication. The nasal spray should be given into 1 nostril. After giving the medication, move the person onto their side. Do not remove or test the nasal spray until ready to use. Get emergency medical help right away after giving the first dose of this medication, even if the person wakes up. You should be familiar with how to recognize the signs and symptoms of a narcotic overdose. If more doses are needed, give the additional dose in the other nostril. Talk to your care team about the use of this medication in children.  While this medication may be prescribed for children as young as newborns for selected conditions, precautions do apply.  Naloxone Overdosage: If you think you have taken too much of this medicine contact a poison control center or emergency room at once.  NOTE: This medicine is only for you. Do not share this medicine with others.  What if I miss a dose? This does not apply.  What may interact with this medication? This is only used during an emergency. No interactions are expected during emergency use. This list may not describe all possible interactions. Give your health care provider a list of all the medicines, herbs, non-prescription drugs, or dietary supplements you use. Also tell them if you smoke, drink alcohol, or use illegal drugs. Some items may interact with your medicine.  What should I watch for while using this medication? Keep this medication ready for use in the case of an opioid overdose. Make sure that you have the phone number of your care team and local hospital ready. You may need to have additional doses of this medication. Each nasal spray contains a single dose. Some emergencies may require additional doses. After use, bring the treated person to the nearest hospital or call 911. Make sure the treating care team knows that the person has received a dose of this medication. You will receive additional instructions on what to do during and after use of this medication before an emergency occurs.  What side effects may I notice from receiving this medication? Side effects that you should report to your care team as soon as possible: Allergic reactions--skin rash, itching, hives, swelling of the face, lips, tongue, or throat Side effects that usually do not require medical attention (report these to your care team if they continue or are bothersome): Constipation Dryness or irritation inside the nose Headache Increase in blood pressure Muscle spasms Stuffy  nose Toothache This list may not describe all possible side effects. Call your doctor for medical advice about side effects. You may report side effects to FDA at 1-800-FDA-1088.  Where should I keep my medication? Because this is an emergency medication, you should keep it with you at all times.  Keep out of the reach of children and pets. Store between 20 and 25 degrees C (68 and 77 degrees F). Do not freeze. Throw away any unused medication after the expiration date. Keep in original box until ready to use.  NOTE: This sheet is a summary. It may not cover all possible information. If you have questions about this medicine, talk to your doctor, pharmacist, or health care provider.   2023 [employer] Standard (2021-03-16 00:00:00)  ____________________________________________________________________________________________   

## 2023-01-08 ENCOUNTER — Ambulatory Visit (HOSPITAL_BASED_OUTPATIENT_CLINIC_OR_DEPARTMENT_OTHER): Payer: Medicare (Managed Care) | Admitting: Pain Medicine

## 2023-01-08 DIAGNOSIS — Z79891 Long term (current) use of opiate analgesic: Secondary | ICD-10-CM

## 2023-01-08 DIAGNOSIS — Z79899 Other long term (current) drug therapy: Secondary | ICD-10-CM

## 2023-01-08 DIAGNOSIS — G894 Chronic pain syndrome: Secondary | ICD-10-CM

## 2023-01-08 DIAGNOSIS — M961 Postlaminectomy syndrome, not elsewhere classified: Secondary | ICD-10-CM

## 2023-01-08 DIAGNOSIS — G8929 Other chronic pain: Secondary | ICD-10-CM

## 2023-01-08 DIAGNOSIS — Z91199 Patient's noncompliance with other medical treatment and regimen due to unspecified reason: Secondary | ICD-10-CM

## 2023-02-11 NOTE — Progress Notes (Addendum)
PROVIDER NOTE: Information contained herein reflects review and annotations entered in association with encounter. Interpretation of such information and data should be left to medically-trained personnel. Information provided to patient can be located elsewhere in the medical record under "Patient Instructions". Document created using STT-dictation technology, any transcriptional errors that may result from process are unintentional.    Patient: [name]  Service Category: E/M  Provider: Oswaldo Done, MD  DOB: [birth year]  DOS: 02/12/2023  Referring Provider: Lynnea Ferrier, MD  MRN: [id number]  Specialty: Interventional Pain Management  PCP: Lynnea Ferrier, MD  Type: Established Patient  Setting: Ambulatory outpatient    Location: Office  Delivery: Face-to-face     HPI  [name], a 77 y.o. year old male, is here today because of his Chronic pain syndrome [G89.4]. 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); Chronic ankle pain (Bilateral); Pain in joint, ankle and foot; Polyneuropathy associated with underlying disease (HCC); Presence of intrathecal pump; Ankle sprain; Rotator cuff sprain; Lumbosacral radiculopathy at S1; Chronic feet pain (3ry area of Pain) (Bilateral) (R>L); Chronic knee pain (4th area of Pain) (Bilateral) (R>L); Failed back surgical syndrome; Lower extremity weakness (Bilateral); Chronic neuropathic pain; Peripheral neurogenic pain; Nonfamilial nocturnal leg cramps (Bilateral); Restless leg syndrome; Pain in right knee; DDD (degenerative disc disease), lumbosacral; Abnormal MRI, lumbar spine (02/12/2022); 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; Lumbar facet arthropathy (Multilevel) (Bilateral); Lumbosacral facet syndrome (Bilateral); Lumbar foraminal narrowing (Multilevel) (Bilateral); Cervicalgia; Chronic shoulder pain (Bilateral) (R>L); Radicular pain of shoulder; Occipital neuralgia (Right); Cervico-occipital neuralgia (Right); Cervical facet hypertrophy (Multilevel) (Bilateral); Cervical facet pain; Painful cervical range of motion; Chronic neurogenic pain; Left rib fracture; Chronic painful diabetic neuropathy (HCC); Foot pain; Lumbago; 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); Decreased range of motion of lumbar spine; Lumbar paraspinal muscle spasm; Spondylosis without myelopathy or radiculopathy, lumbosacral region; Chronic low back pain (Bilateral) w/ sciatica (Left); Chronic lower extremity pain (Left); Lumbosacral radiculopathy at L5; DDD (degenerative disc disease), cervical; Abnormal MRI, cervical spine (10/15/2022); Lumbar facet joint pain; Central spinal stenosis (L3-4, L4-5); Lumbar lateral recess (subarticular zone) stenosis (Left: L2-3) (Bilateral: L3-4, L4-5) (L>R); Hypertrophy of ligamentum flavum (L3-4); Lumbar intervertebral disc displacement (IVDD); Lumbar nerve root impingement (Left: L3 at L2-3 & L3-4) (Right: L3 at L3-4); and Diabetic generalized sensorimotor polyneuropathy (HCC) on their pertinent problem list. Pain Assessment: Severity of Chronic pain is reported as a 9 /10. Location: Back Lower/to feet bilat. Onset: More than a month ago. Quality: Other (Comment), Constant ("teeth ginding pain"). Timing: Constant. Modifying factor(s): "meds do not help like they used to, feels like withdrawl". Vitals:  height is 6\' 3"  (1.905 m) and weight is 200 lb (90.7 kg). His temporal temperature is 99.3 F (37.4 C). His blood pressure is 127/74 and his pulse is 79. His respiration is 18 and oxygen saturation is 95%.  BMI: Estimated body mass index is 25 kg/m as  calculated from the following:   Height as of this encounter: 6\' 3"  (1.905 m).   Weight as of this encounter: 200 lb (90.7 kg). Last encounter: 01/08/2023. Last procedure: 06/21/2021.  (Intrathecal pump management) Last pain management related surgery: 12/20/2021.  (  Removal of intrathecal pump)  Reason for encounter: medication management.  The patient indicates doing well with the current medication regimen. No adverse reactions or side effects reported to the medications.  Today he comes in indicating that he is having more pain which apparently may be associated with the fact that he has ran out of medication and he feels he may be having some withdrawals.  Today he has asked for [place] to increase his strength on his medication, decrease the time interval between doses, or to switch him to another pain medicine.  Today I took the time to explain to him the concept of tolerance and how it can develop to the family of opioids in general and switching him from 1 medication to another would not really be addressing the issue of tolerance.   I took the opportunity to explain the concept of tolerance and how "Drug Holidays" help control it.  I detailed how to do a slow taper to avoid withdrawals.   In addition, I reminded him that if he keeps his appointments, it is unlikely that he will be running out of medication like he just did.  On 10/14/2022 I provided him with 3 prescriptions to last until approximately 02/02/2023.  He had an appointment to come in on 01/08/2023 for a medication evaluation and management including refills, but he did not keep that appointment.  Today I have given him enough refills to last until 05/13/2023 and I have reminded him to make sure that he comes in before that date and that way he will not be running out of medicine.  His wife was present today and she indicated that it was partially her fault since she was not available to bring him in.  In any case, he could have gotten an  earlier appointment closer to the 01/08/2023 to get his refill.  Today I have declined his request to increase his dose, decrease his frequency, and/or switch him to another medication.  He is already at 43 MME's per day and I will not be going any higher than that.  PMP: Last hydromorphone 4 mg (# 90) prescription (3/3) filled on 01/03/2023, written on 10/14/2022.  Last pregabalin 75 mg capsule (# 90) filled was on 08/02/2022, written on 03/20/2022 (by me). (10/14/2022) 3 prescriptions written to be filled on 10/15/2022, 11/14/2022, and 12/14/2022.  (3/3 filled.  Last: 01/03/2023) (01/08/2023) patient did not keep medication management appointment.  Today I have also reminded him that he probably needs to continue taking other adjuvant medications to help decrease this pain so that it would be easier for his hydromorphone to control it.  I have explained to him the concept of having multifactorial etiologies to the pain and that the neurogenic component of the pain would probably not be controlled by any type of opioid analgesics since they are notorious for not being effective in neuropathic pain.  I have recommended that he continue taking some type of membrane stabilizer such as gabapentin or Lyrica and I have also reminded him that with regards to the Lyrica, if he is not having any side effects to it, he could go as high as 450 mg/day in divided doses.  This medication should help him with the neuropathic component of the pain.  He also has an arthropathic component to his pain, but because he is anticoagulated on Eliquis, NSAIDs are relatively contraindicated.  However, I have provided them with a list of some over-the-counter supplement recommendations, such as turmeric/core coming; glucosamine/chondroitin; moringa;  and vitamin D.  All of these can be taken concomitantly to assist in the differing ways with his chronic pain.  In addition to the above, I have also reminded him that whenever he is  having a flareup of his pain, interventional therapies can be used to bring the pain down to a more controllable level.  Today he indicates his pain to be in the lower back with pain going down the legs that seem to be referred.  He describes the primary to be the lower back (Bilateral) with the lower extremity pain being secondary.  X-rays of the lumbar spine with bending views done on 01/30/2022 showed the patient to currently have no hardware that would impede treatment with radiofrequency ablation.  During today's visit I asked the patient to perform a hyperextension and rotation maneuver of the lumbar spine which worsened his pain in the lower back suggesting that some of this pain is coming from the lumbar facet joints.  The same x-ray shows the patient to have a grade 1 to grade 2 anterolisthesis of L4 over L5 with bilateral facet joint arthropathy, also worse at the L4-5 level.  RTCB: 05/13/2023   (02/26/2023) addendum*: (Please see physical exam under medical necessity statement for the specific order for facet block, under "Orders".) "Medical Necessity Statement: 1.Severe chronic axial low back pain causing functional impairment documented by ongoing pain scale assessments. 2.Pain present for longer than 3 months (Chronic) documented to have failed noninvasive conservative therapies. 3.Absence of untreated radiculopathy. 4.There is no radiological evidence of untreated fractures, tumor, infection, or deformity.  Physical Examination Findings: Positive Kemp Maneuver: (Y)  Positive Lumbar Hyperextension-Rotation provocative test: (Y)"  * "Addendum" added due to the fact that the patient's insurance company denied initial request for lumbar facet block stating that there was no physical exam to justify the requested facet blocks.  This note had initially been sent to them and they did not take the time to carefully look at the note which did contain the above information.  Today this information  was simply copied from the lower portion of this note and pasted above.  This is a perfect example of the base less denials of these insurance companies.  Today I have gone to the lower portion of this note with this information was available and I have highlighted it in bold red letters to see if this time they can find.  Pharmacotherapy Assessment  Analgesic: Hydromorphone 4 mg tablet, 1 tab p.o. 3 times daily MME/day: 60 mg/day    Monitoring: [place] PMP: PDMP reviewed during this encounter.       Pharmacotherapy: No side-effects or adverse reactions reported. Compliance: No problems identified. Effectiveness: Clinically acceptable.  Nonah Mattes, RN  02/12/2023  6:08 PM  Signed Nursing Pain Medication Assessment:  Safety precautions to be maintained throughout the outpatient stay will include: orient to surroundings, keep bed in low position, maintain call bell within reach at all times, provide assistance with transfer out of bed and ambulation.  Medication Inspection Compliance: Pill count conducted under aseptic conditions, in front of the patient. Neither the pills nor the bottle was removed from the patient's sight at any time. Once count was completed pills were immediately returned to the patient in their original bottle.  Medication: Hydromorphone (Dilaudid) Pill/Patch Count:  0 of 90 pills remain Pill/Patch Appearance: Markings consistent with prescribed medication Bottle Appearance: Standard pharmacy container. Clearly labeled. Filled Date: 6 / 25 / 2024 Last Medication intake:  Ran out of medicine  more than 48 hours ago    No results found for: "CBDTHCR" No results found for: "D8THCCBX" No results found for: "D9THCCBX"  UDS:  Summary  Date Value Ref Range Status  10/14/2022 Note  Final    Comment:    ==================================================================== [employer] Select 13 (MW) ==================================================================== Test                              Result       Flag       Units  Drug Present and Declared for Prescription Verification   Hydromorphone                  3914         EXPECTED   ng/mg creat    Hydromorphone may be administered as a scheduled prescription    medication; it is also an expected metabolite of hydrocodone.  ==================================================================== Test                      Result    Flag   Units      Ref Range   Creatinine              84               mg/dL      >=16 ==================================================================== Declared Medications:  The flagging and interpretation on this report are based on the  following declared medications.  Unexpected results may arise from  inaccuracies in the declared medications.   **Note: The testing scope of this panel includes these medications:   Hydromorphone (Dilaudid)   **Note: The testing scope of this panel does not include the  following reported medications:   Albuterol (Ventolin HFA)  Amitriptyline (Elavil)  Apixaban (Eliquis)  Aspirin  Azelastine (Astelin)  Bisacodyl  Clonidine (Catapres)  Duloxetine (Cymbalta)  Esomeprazole (Nexium)  Latanoprost (Xalatan)  Levothyroxine (Synthroid)  Loratadine (Claritin)  Losartan (Cozaar)  Mexiletine  Naloxone (Narcan)  Pregabalin (Lyrica)  Promethazine (Phenergan)  Simvastatin (Zocor)  Tizanidine (Zanaflex) ==================================================================== For clinical consultation, please call [phone]. ====================================================================       ROS  Constitutional: Denies any fever or chills Gastrointestinal: No reported hemesis, hematochezia, vomiting, or acute GI distress Musculoskeletal: Denies any acute onset joint swelling, redness, loss of ROM, or weakness Neurological: No reported episodes of acute onset apraxia, aphasia, dysarthria, agnosia, amnesia, paralysis, loss of  coordination, or loss of consciousness  Medication Review  DULoxetine, HYDROmorphone, albuterol, amitriptyline, apixaban, aspirin EC, azelastine, bisacodyl, cloNIDine, levothyroxine, loratadine, losartan, naloxone, pregabalin, and simvastatin  History Review  Allergy: [name] is allergic to celebrex [celecoxib], codeine, penicillins, and entresto [sacubitril-valsartan]. Drug: [name]  reports no history of drug use. Alcohol:  reports no history of alcohol use. Tobacco:  reports that he has been smoking cigarettes. He has a 44.3 pack-year smoking history. He has never used smokeless tobacco. Social: Mr. Kizzire  reports that he has been smoking cigarettes. He has a 44.3 pack-year smoking history. He has never used smokeless tobacco. He reports that he does not drink alcohol and does not use drugs. Medical:  has a past medical history of Acute anterolateral wall MI (HCC) (2000), Acute non-Q wave non-ST elevation myocardial infarction (NSTEMI) (HCC) (12/03/2004), Adenomatous polyp of colon, Agent orange exposure, Angina pectoris (HCC), Anxiety, Aortic atherosclerosis (HCC), Asthma, Bladder stones, Bradycardia, CHF (congestive heart failure) (HCC), Chronic lower back pain, Chronic pain syndrome, COPD (chronic obstructive pulmonary disease) (HCC),  Coronary artery disease, Depression, DJD (degenerative joint disease), DVT of lower extremity, bilateral (HCC), Dyspnea, GERD (gastroesophageal reflux disease), History of hiatal hernia, HLD (hyperlipidemia), Hypertension, Hypothyroidism, Inflammatory polyarthropathy (HCC), Internal hemorrhoids, Long term prescription opiate use, 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 Surgical: [name]  has a past surgical history that includes Coronary angioplasty; Posterior lumbar fusion (N/A, 1999); Nissen fundoplication (N/A, 2000); Mastoidectomy; Partial colectomy (N/A); Intrathecal pump implantation (N/A, 03/2008); Intrathecal pump revision (N/A, 09/28/2014); Transurethral resection of bladder tumor (N/A, 03/07/2015); Colonoscopy with propofol (N/A, 02/27/2018); External ear surgery; Skin cancer excision; IVC FILTER PLACEMENT ([facility] HX) (N/A, 09/2007); IVC FILTER REMOVAL (N/A, 2011); Colonoscopy (N/A, 11/22/2013); Colonoscopy (N/A, 09/19/2008); Colonoscopy (N/A, 08/25/2003); Colonoscopy (N/A, 10/15/1994); Posterior laminectomy / decompression cervical spine (N/A, 1984); Posterior laminectomy / decompression cervical spine (N/A, 1985); Tonsillectomy (Bilateral, 1958); Coronary angioplasty with stent (Left, 12/04/2004); LEFT HEART CATH AND CORONARY ANGIOGRAPHY (Left, 02/19/2008); LEFT HEART CATH AND CORONARY ANGIOGRAPHY (Left, 04/18/1987); LEFT HEART CATH AND CORONARY ANGIOGRAPHY (Left, 02/03/2002); and Pain pump removal (N/A, 12/20/2021). Family: family history includes Emphysema in his father and sister; Heart attack in his father; Heart disease in his brother and mother.  Laboratory Chemistry Profile   Renal Lab Results  Component Value Date   BUN 10 01/30/2022   CREATININE 0.91 01/30/2022   BCR 11 01/30/2022   GFRAA >60 03/02/2015   GFRNONAA >60 11/01/2021    Hepatic Lab Results  Component Value Date   AST 31 01/30/2022   ALBUMIN 4.0 01/30/2022   ALKPHOS 96 01/30/2022    Electrolytes Lab Results  Component Value Date   NA 145 (H) 01/30/2022   K 5.2 01/30/2022   CL 104 01/30/2022   CALCIUM 9.6 01/30/2022   MG 2.3 11/01/2021   PHOS 3.7 11/01/2021    Bone Lab Results  Component Value  Date   25OHVITD1 22 (L) 02/07/2021   25OHVITD2 <1.0 02/07/2021   25OHVITD3 22 02/07/2021    Inflammation (CRP: Acute Phase) (ESR: Chronic Phase) Lab Results  Component Value Date   CRP 5 01/30/2022   ESRSEDRATE 11 01/30/2022   LATICACIDVEN 0.9 10/28/2021         Note: Above Lab results reviewed.  Recent Imaging Review  DG Ribs Unilateral W/Chest Right CLINICAL DATA:  Pain after fall  EXAM: RIGHT RIBS AND CHEST - 4 VIEW  COMPARISON:  Chest x-ray 10/28/2021  FINDINGS: Elevation of the right hemidiaphragm. Chronic interstitial changes of the lungs. No consolidation, pneumothorax or effusion. Normal cardiopericardial silhouette with a calcified aorta. No edema. Surgical clips in the left axillary region. No right-sided rib fracture identified.  IMPRESSION: Elevated right hemidiaphragm. Chronic interstitial changes of the lungs. No pneumothorax or effusion.  No right-sided rib fracture identified.  Electronically Signed   By: Karen Kays M.D.   On: 02/24/2023 15:28 Note: Reviewed        Physical Exam  General appearance: Well nourished, well developed, and well hydrated. In no apparent acute distress Mental status: Alert, oriented x 3 (person, place, & time)       Respiratory: No evidence of acute respiratory distress Eyes: PERLA Vitals: BP 127/74   Pulse 79   Temp 99.3 F (37.4  C) (Temporal)   Resp 18   Ht 6\' 3"  (1.905 m)   Wt 200 lb (90.7 kg)   SpO2 95%   BMI 25.00 kg/m  BMI: Estimated body mass index is 25 kg/m as calculated from the following:   Height as of this encounter: 6\' 3"  (1.905 m).   Weight as of this encounter: 200 lb (90.7 kg). Ideal: Ideal body weight: 84.5 kg (186 lb 4.6 oz) Adjusted ideal body weight: 87 kg (191 lb 12.4 oz)  Assessment   Diagnosis Status  1. Chronic pain syndrome   2. 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 Chronic lower extremity pain (2ry area of Pain) (Bilateral)  (R>L)   4. Chronic feet pain (3ry area of Pain) (Bilateral) (R>L)   5. Chronic knee pain (4th area of Pain) (Bilateral) (R>L)   6. Lumbar post-laminectomy syndrome   7. Failed back surgical syndrome   8. 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 Lumbar facet arthropathy (Multilevel) (Bilateral)   10. Lumbar facet joint pain   11. Lumbosacral facet syndrome (Bilateral)   12. Spondylosis without myelopathy or radiculopathy, lumbosacral region   13. Central spinal stenosis (L3-4, L4-5)   14. Lumbar lateral recess (subarticular zone) stenosis (Left: L2-3) (Bilateral: L3-4, L4-5) (L>R)   15. Hypertrophy of ligamentum flavum (L3-4)   16. Lumbar intervertebral disc displacement (IVDD)   17. Lumbar nerve root impingement (Left: L3 at L2-3 & L3-4) (Right: L3 at L3-4)   18. Abnormal MRI, lumbar spine (02/12/2022)   19. Diabetic generalized sensorimotor polyneuropathy (HCC)   20. Chronic painful diabetic neuropathy (HCC)   21. Pharmacologic therapy   22. Chronic use of opiate for therapeutic purpose   23. Encounter for medication management   24. Encounter for chronic pain management   25. Chronic anticoagulation (Eliquis)    Controlled Controlled Controlled   Updated Problems: No problems updated.   Plan of Care  Problem-specific:  No problem-specific Assessment & Plan notes found for this encounter.  Mr. TUNIS DUNK has a current medication list which includes the following long-term medication(s): albuterol, amitriptyline, azelastine, duloxetine, eliquis, hydromorphone, [START ON 03/14/2023] hydromorphone, [START ON 04/13/2023] hydromorphone, levothyroxine, loratadine, losartan, pregabalin, and simvastatin.  Pharmacotherapy (Medications Ordered): Meds ordered this encounter  Medications   HYDROmorphone (DILAUDID) 4 MG tablet    Sig: Take 1 tablet (4 mg total) by mouth every 8 (eight) hours as needed. Must last 30 days.    Dispense:  90 tablet    Refill:  0    DO NOT:  delete (not duplicate); no partial-fill (will deny script to complete), no refill request (F/U required). DISPENSE: 1 day early if closed on fill date. WARN: No CNS-depressants within 8 hrs of med.   naloxone (NARCAN) nasal spray 4 mg/0.1 mL    Sig: Place 1 spray into the nose as needed for up to 365 doses (for opioid-induced respiratory depresssion). In case of emergency (overdose), spray once into each nostril. If no response within 3 minutes, repeat application and call 911.    Dispense:  1 each    Refill:  0    Instruct patient in proper use of device.   HYDROmorphone (DILAUDID) 4 MG tablet    Sig: Take 1 tablet (4 mg total) by mouth every 8 (eight) hours as needed. Must last 30 days.    Dispense:  90 tablet    Refill:  0    DO NOT: delete (  not duplicate); no partial-fill (will deny script to complete), no refill request (F/U required). DISPENSE: 1 day early if closed on fill date. WARN: No CNS-depressants within 8 hrs of med.   HYDROmorphone (DILAUDID) 4 MG tablet    Sig: Take 1 tablet (4 mg total) by mouth every 8 (eight) hours as needed. Must last 30 days.    Dispense:  90 tablet    Refill:  0    DO NOT: delete (not duplicate); no partial-fill (will deny script to complete), no refill request (F/U required). DISPENSE: 1 day early if closed on fill date. WARN: No CNS-depressants within 8 hrs of med.   Orders:  Orders Placed This Encounter  Procedures   LUMBAR FACET(MEDIAL BRANCH NERVE BLOCK) MBNB    Diagnosis: Lumbar Facet Syndrome (M47.816); Lumbosacral Facet Syndrome (M47.817); Lumbar Facet Joint Pain (M54.59) Medical Necessity Statement: 1.Severe chronic axial low back pain causing functional impairment documented by ongoing pain scale assessments. 2.Pain present for longer than 3 months (Chronic) documented to have failed noninvasive conservative therapies. 3.Absence of untreated radiculopathy. 4.There is no radiological evidence of untreated fractures, tumor, infection, or  deformity.  Physical Examination Findings: Positive Kemp Maneuver: (Y)  Positive Lumbar Hyperextension-Rotation provocative test: (Y)    Standing Status:   Future    Standing Expiration Date:   05/15/2023    Scheduling Instructions:     Procedure: Lumbar facet Block     Type: Medial Branch Block     Side: Bilateral     Purpose: Diagnostic Radiologic Mapping     Level(s): L3-4, L4-5, L5-S1, and TBD by Fluoroscopic Mapping Facets (L2, L3, L4, L5, S1, and TBD Medial Branch)     Sedation: With Sedation.     Timeframe: ASAA    Order Specific Question:   Where will this procedure be performed?    Answer:   [facility]   Blood Thinner Instructions to Nursing    Always make sure patient has clearance from prescribing physician to stop blood thinners for interventional therapies. If the patient requires a Lovenox-bridge therapy, make sure arrangements are made to institute it with the assistance of the PCP.    Scheduling Instructions:     Have [name] stop the Eliquis (Apixaban) x 3 days prior to procedure or surgery.   Follow-up plan:   Return for (ECT): (B) L-FCT BLK #1, (Blood Thinner Protocol).      Interventional Therapies  Risk Factors  Complex Considerations:  Anticoagulation: Eliquis (Stop: 3 days  Restart: 6 hrs)  T2IDDM  CAD  CHF  COPD  Hx. Nephrolithiasis  A-Fib  HTN  IBS  Hx. NSTEMI MI  Hx DVT  Depression  Anxiety    Planned  Pending:   Diagnostic/therapeutic bilateral lumbar facet MBB #1    Under consideration:   Diagnostic/therapeutic bilateral lumbar facet MBB #1  Therapeutic/palliative left L4-5 LESI #1  Therapeutic left caudal ESI #2  Therapeutic bilateral lower extremity Qutenza treatment #1    Completed:   Diagnostic left caudal ESI and epidurogram x1 (04/24/2021) (LBP: 50/50/0  LEP; 100/100/50-60)  Diagnostic intrathecal pump catheter test/evaluation x1 (06/21/2021) (Dx: Catheter malfunction/dysfunction)  Intrathecal catheter and pump  removal by me (12/20/2021) (2ry to end of battery life & catheter malfunction)    Completed by other providers:   Diagnostic EMG/PNCV (LE) (05/22/2021) by Dr. 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  Unable to rule out bilateral L5 chronic radiculopathies. Referral (02/07/2021 & 09/19/2021) to [facility] neurosurgery Lucy Chris, MD)  for ITP replacement.  Cardiology clearance to Dr. Juliann Pares requested (09/19/2021)    Therapeutic  Palliative (PRN) options:   None established   Pharmacotherapy  Nonopioids transferred (03/20/2022): Pregabalin (Lyrica) 75 mg TID.       Recent Visits Date Type Provider Dept  02/12/23 Office Visit Delano Metz, MD [facility]  Showing recent visits within past 90 days and meeting all other requirements Future Appointments Date Type Provider Dept  05/07/23 Appointment Delano Metz, MD [facility]  Showing future appointments within next 90 days and meeting all other requirements  I discussed the assessment and treatment plan with the patient. The patient was provided an opportunity to ask questions and all were answered. The patient agreed with the plan and demonstrated an understanding of the instructions.  Patient advised to call back or seek an in-person evaluation if the symptoms or condition worsens.  Duration of encounter: 150 minutes.  Total time on encounter, as per AMA guidelines included both the face-to-face and non-face-to-face time personally spent by the physician and/or other qualified health care professional(s) on the day of the encounter (includes time in activities that require the physician or other qualified health care professional and does not include time in activities normally performed by clinical staff). Physician's time may include the following activities when performed: Preparing to see the patient (e.g., pre-charting review of records, searching  for previously ordered imaging, lab work, and nerve conduction tests) Review of prior analgesic pharmacotherapies. Reviewing PMP Interpreting ordered tests (e.g., lab work, imaging, nerve conduction tests) Performing post-procedure evaluations, including interpretation of diagnostic procedures Obtaining and/or reviewing separately obtained history Performing a medically appropriate examination and/or evaluation Counseling and educating the patient/family/caregiver Ordering medications, tests, or procedures Referring and communicating with other health care professionals (when not separately reported) Documenting clinical information in the electronic or other health record Independently interpreting results (not separately reported) and communicating results to the patient/ family/caregiver Care coordination (not separately reported)  Note by: Oswaldo Done, MD Date: 02/12/2023; Time: 6:53 AM

## 2023-02-12 ENCOUNTER — Encounter: Payer: Self-pay | Admitting: Pain Medicine

## 2023-02-12 ENCOUNTER — Ambulatory Visit: Payer: Medicare (Managed Care) | Attending: Pain Medicine | Admitting: Pain Medicine

## 2023-02-12 VITALS — BP 127/74 | HR 79 | Temp 99.3°F | Resp 18 | Ht 75.0 in | Wt 200.0 lb

## 2023-02-12 DIAGNOSIS — G8929 Other chronic pain: Secondary | ICD-10-CM | POA: Diagnosis present

## 2023-02-12 DIAGNOSIS — M5126 Other intervertebral disc displacement, lumbar region: Secondary | ICD-10-CM

## 2023-02-12 DIAGNOSIS — M79605 Pain in left leg: Secondary | ICD-10-CM | POA: Insufficient documentation

## 2023-02-12 DIAGNOSIS — M48061 Spinal stenosis, lumbar region without neurogenic claudication: Secondary | ICD-10-CM

## 2023-02-12 DIAGNOSIS — Z79891 Long term (current) use of opiate analgesic: Secondary | ICD-10-CM

## 2023-02-12 DIAGNOSIS — M961 Postlaminectomy syndrome, not elsewhere classified: Secondary | ICD-10-CM

## 2023-02-12 DIAGNOSIS — M79672 Pain in left foot: Secondary | ICD-10-CM | POA: Diagnosis present

## 2023-02-12 DIAGNOSIS — E1140 Type 2 diabetes mellitus with diabetic neuropathy, unspecified: Secondary | ICD-10-CM

## 2023-02-12 DIAGNOSIS — M25561 Pain in right knee: Secondary | ICD-10-CM | POA: Insufficient documentation

## 2023-02-12 DIAGNOSIS — M79671 Pain in right foot: Secondary | ICD-10-CM | POA: Diagnosis present

## 2023-02-12 DIAGNOSIS — M2428 Disorder of ligament, vertebrae: Secondary | ICD-10-CM | POA: Diagnosis present

## 2023-02-12 DIAGNOSIS — M5442 Lumbago with sciatica, left side: Secondary | ICD-10-CM | POA: Diagnosis present

## 2023-02-12 DIAGNOSIS — Z7729 Contact with and (suspected ) exposure to other hazardous substances: Secondary | ICD-10-CM | POA: Insufficient documentation

## 2023-02-12 DIAGNOSIS — M25562 Pain in left knee: Secondary | ICD-10-CM | POA: Diagnosis present

## 2023-02-12 DIAGNOSIS — Z79899 Other long term (current) drug therapy: Secondary | ICD-10-CM

## 2023-02-12 DIAGNOSIS — M5459 Other low back pain: Secondary | ICD-10-CM

## 2023-02-12 DIAGNOSIS — M4316 Spondylolisthesis, lumbar region: Secondary | ICD-10-CM

## 2023-02-12 DIAGNOSIS — E1142 Type 2 diabetes mellitus with diabetic polyneuropathy: Secondary | ICD-10-CM

## 2023-02-12 DIAGNOSIS — R937 Abnormal findings on diagnostic imaging of other parts of musculoskeletal system: Secondary | ICD-10-CM

## 2023-02-12 DIAGNOSIS — Z7901 Long term (current) use of anticoagulants: Secondary | ICD-10-CM

## 2023-02-12 DIAGNOSIS — M47816 Spondylosis without myelopathy or radiculopathy, lumbar region: Secondary | ICD-10-CM | POA: Diagnosis present

## 2023-02-12 DIAGNOSIS — M79604 Pain in right leg: Secondary | ICD-10-CM | POA: Diagnosis present

## 2023-02-12 DIAGNOSIS — M4800 Spinal stenosis, site unspecified: Secondary | ICD-10-CM

## 2023-02-12 DIAGNOSIS — M5416 Radiculopathy, lumbar region: Secondary | ICD-10-CM

## 2023-02-12 DIAGNOSIS — G894 Chronic pain syndrome: Secondary | ICD-10-CM

## 2023-02-12 DIAGNOSIS — M47817 Spondylosis without myelopathy or radiculopathy, lumbosacral region: Secondary | ICD-10-CM | POA: Diagnosis present

## 2023-02-12 DIAGNOSIS — M5441 Lumbago with sciatica, right side: Secondary | ICD-10-CM | POA: Diagnosis present

## 2023-02-12 MED ORDER — HYDROMORPHONE HCL 4 MG PO TABS
4.0000 mg | ORAL_TABLET | Freq: Three times a day (TID) | ORAL | 0 refills | Status: DC | PRN
Start: 2023-04-13 — End: 2023-05-08

## 2023-02-12 MED ORDER — HYDROMORPHONE HCL 4 MG PO TABS
4.0000 mg | ORAL_TABLET | Freq: Three times a day (TID) | ORAL | 0 refills | Status: DC | PRN
Start: 2023-02-12 — End: 2023-05-08

## 2023-02-12 MED ORDER — HYDROMORPHONE HCL 4 MG PO TABS
4.0000 mg | ORAL_TABLET | Freq: Three times a day (TID) | ORAL | 0 refills | Status: DC | PRN
Start: 2023-03-14 — End: 2023-05-08

## 2023-02-12 MED ORDER — NALOXONE HCL 4 MG/0.1ML NA LIQD
1.0000 | NASAL | 0 refills | Status: AC | PRN
Start: 2023-02-12 — End: 2024-02-12

## 2023-02-12 NOTE — Progress Notes (Signed)
Nursing Pain Medication Assessment:  Safety precautions to be maintained throughout the outpatient stay will include: orient to surroundings, keep bed in low position, maintain call bell within reach at all times, provide assistance with transfer out of bed and ambulation.  Medication Inspection Compliance: Pill count conducted under aseptic conditions, in front of the patient. Neither the pills nor the bottle was removed from the patient's sight at any time. Once count was completed pills were immediately returned to the patient in their original bottle.  Medication: Hydromorphone (Dilaudid) Pill/Patch Count:  0 of 90 pills remain Pill/Patch Appearance: Markings consistent with prescribed medication Bottle Appearance: Standard pharmacy container. Clearly labeled. Filled Date: 6 / 60 / 2024 Last Medication intake:  Ran out of medicine more than 48 hours ago

## 2023-02-12 NOTE — Patient Instructions (Addendum)
OTC Recommendations: Consider taking over-the-counter supplements such as: Turmeric/curcumin*(anti-inflammatory) Glucosamine/chondroitin (triple strength)*(may help prevent loss of articular cartilage) Vitamin D*(may have the ability to suppress release of chemicals associated with inflammation) Moringa*(anti-inflammatory with mild analgesic effects) (*Always use manufacturer's recommended dosage.)   ____________________________________________________________________________________________  Blood Thinners  IMPORTANT NOTICE:  If you take any of these, make sure to notify the nursing staff.  Failure to do so may result in injury.  Recommended time intervals to stop and restart blood-thinners, before & after invasive procedures  Generic Name Brand Name Pre-procedure. Stop this long before procedure. Post-procedure. Minimum waiting period before restarting.  Abciximab Reopro 15 days 2 hrs  Alteplase Activase 10 days 10 days  Anagrelide Agrylin    Apixaban Eliquis 3 days 6 hrs  Cilostazol Pletal 3 days 5 hrs  Clopidogrel Plavix 7-10 days 2 hrs  Dabigatran Pradaxa 5 days 6 hrs  Dalteparin Fragmin 24 hours 4 hrs  Dipyridamole Aggrenox 11days 2 hrs  Edoxaban Lixiana; Savaysa 3 days 2 hrs  Enoxaparin  Lovenox 24 hours 4 hrs  Eptifibatide Integrillin 8 hours 2 hrs  Fondaparinux  Arixtra 72 hours 12 hrs  Hydroxychloroquine Plaquenil 11 days   Prasugrel Effient 7-10 days 6 hrs  Reteplase Retavase 10 days 10 days  Rivaroxaban Xarelto 3 days 6 hrs  Ticagrelor Brilinta 5-7 days 6 hrs  Ticlopidine Ticlid 10-14 days 2 hrs  Tinzaparin Innohep 24 hours 4 hrs  Tirofiban Aggrastat 8 hours 2 hrs  Warfarin Coumadin 5 days 2 hrs   Other medications with blood-thinning effects  Product indications Generic (Brand) names Note  Cholesterol Lipitor Stop 4 days before procedure  Blood thinner (injectable) Heparin (LMW or LMWH Heparin) Stop 24 hours before procedure  Cancer Ibrutinib (Imbruvica)  Stop 7 days before procedure  Malaria/Rheumatoid Hydroxychloroquine (Plaquenil) Stop 11 days before procedure  Thrombolytics  10 days before or after procedures   Over-the-counter (OTC) Products with blood-thinning effects  Product Common names Stop Time  Aspirin > 325 mg Goody Powders, Excedrin, etc. 11 days  Aspirin ? 81 mg  7 days  Fish oil  4 days  Garlic supplements  7 days  Ginkgo biloba  36 hours  Ginseng  24 hours  NSAIDs Ibuprofen, Naprosyn, etc. 3 days  Vitamin E  4 days   ____________________________________________________________________________________________   ______________________________________________________________________  Procedure instructions  Do not eat or drink fluids (other than water) for 6 hours before your procedure  No water for 2 hours before your procedure  Take your blood pressure medicine with a sip of water  Arrive 30 minutes before your appointment  Carefully read the "Preparing for your procedure" detailed instructions  If you have questions call us at [phone]  _____________________________________________________________________    ______________________________________________________________________  Preparing for your procedure  Appointments: If you think you may not be able to keep your appointment, call 24-48 hours in advance to cancel. We need time to make it available to others.  During your procedure appointment there will be: No Prescription Refills. No disability issues to discussed. No medication changes or discussions.  Instructions: Food intake: Avoid eating anything solid for at least 8 hours prior to your procedure. Clear liquid intake: You may take clear liquids such as water up to 2 hours prior to your procedure. (No carbonated drinks. No soda.) Transportation: Unless otherwise stated by your physician, bring a driver. Morning Medicines: Except for blood thinners, take all of your other morning  medications with a sip of water. Make sure to take your heart  and blood pressure medicines. If your blood pressure's lower number is above 100, the case will be rescheduled. Blood thinners: Make sure to stop your blood thinners as instructed.  If you take a blood thinner, but were not instructed to stop it, call our office [phone] and ask to talk to a nurse. Not stopping a blood thinner prior to certain procedures could lead to serious complications. Diabetics on insulin: Notify the staff so that you can be scheduled 1st case in the morning. If your diabetes requires high dose insulin, take only  of your normal insulin dose the morning of the procedure and notify the staff that you have done so. Preventing infections: Shower with an antibacterial soap the morning of your procedure.  Build-up your immune system: Take 1000 mg of Vitamin C with every meal (3 times a day) the day prior to your procedure. Antibiotics: Inform the nursing staff if you are taking any antibiotics or if you have any conditions that may require antibiotics prior to procedures. (Example: recent joint implants)   Pregnancy: If you are pregnant make sure to notify the nursing staff. Not doing so may result in injury to the fetus, including death.  Sickness: If you have a cold, fever, or any active infections, call and cancel or reschedule your procedure. Receiving steroids while having an infection may result in complications. Arrival: You must be in the facility at least 30 minutes prior to your scheduled procedure. Tardiness: Your scheduled time is also the cutoff time. If you do not arrive at least 15 minutes prior to your procedure, you will be rescheduled.  Children: Do not bring any children with you. Make arrangements to keep them home. Dress appropriately: There is always a possibility that your clothing may get soiled. Avoid long dresses. Valuables: Do not bring any jewelry or valuables.  Reasons to call and  reschedule or cancel your procedure: (Following these recommendations will minimize the risk of a serious complication.) Surgeries: Avoid having procedures within 2 weeks of any surgery. (Avoid for 2 weeks before or after any surgery). Flu Shots: Avoid having procedures within 2 weeks of a flu shots or . (Avoid for 2 weeks before or after immunizations). Barium: Avoid having a procedure within 7-10 days after having had a radiological study involving the use of radiological contrast. (Myelograms, Barium swallow or enema study). Heart attacks: Avoid any elective procedures or surgeries for the initial 6 months after a "Myocardial Infarction" (Heart Attack). Blood thinners: It is imperative that you stop these medications before procedures. Let us know if you if you take any blood thinner.  Infection: Avoid procedures during or within two weeks of an infection (including chest colds or gastrointestinal problems). Symptoms associated with infections include: Localized redness, fever, chills, night sweats or profuse sweating, burning sensation when voiding, cough, congestion, stuffiness, runny nose, sore throat, diarrhea, nausea, vomiting, cold or Flu symptoms, recent or current infections. It is specially important if the infection is over the area that we intend to treat. Heart and lung problems: Symptoms that may suggest an active cardiopulmonary problem include: cough, chest pain, breathing difficulties or shortness of breath, dizziness, ankle swelling, uncontrolled high or unusually low blood pressure, and/or palpitations. If you are experiencing any of these symptoms, cancel your procedure and contact your primary care physician for an evaluation.  Remember:  Regular Business hours are:  Monday to Thursday 8:00 AM to 4:00 PM  Provider's Schedule: Delano Metz, MD:  Procedure days: Tuesday and Thursday 7:30 AM  to 4:00 PM  Edward Jolly, MD:  Procedure days: Monday and Wednesday 7:30 AM to 4:00  PM  ______________________________________________________________________    ____________________________________________________________________________________________  General Risks and Possible Complications  Patient Responsibilities: It is important that you read this as it is part of your informed consent. It is our duty to inform you of the risks and possible complications associated with treatments offered to you. It is your responsibility as a patient to read this and to ask questions about anything that is not clear or that you believe was not covered in this document.  Patient's Rights: You have the right to refuse treatment. You also have the right to change your mind, even after initially having agreed to have the treatment done. However, under this last option, if you wait until the last second to change your mind, you may be charged for the materials used up to that point.  Introduction: Medicine is not an Visual merchandiser. Everything in Medicine, including the lack of treatment(s), carries the potential for danger, harm, or loss (which is by definition: Risk). In Medicine, a complication is a secondary problem, condition, or disease that can aggravate an already existing one. All treatments carry the risk of possible complications. The fact that a side effects or complications occurs, does not imply that the treatment was conducted incorrectly. It must be clearly understood that these can happen even when everything is done following the highest safety standards.  No treatment: You can choose not to proceed with the proposed treatment alternative. The "PRO(s)" would include: avoiding the risk of complications associated with the therapy. The "CON(s)" would include: not getting any of the treatment benefits. These benefits fall under one of three categories: diagnostic; therapeutic; and/or palliative. Diagnostic benefits include: getting information which can ultimately lead to  improvement of the disease or symptom(s). Therapeutic benefits are those associated with the successful treatment of the disease. Finally, palliative benefits are those related to the decrease of the primary symptoms, without necessarily curing the condition (example: decreasing the pain from a flare-up of a chronic condition, such as incurable terminal cancer).  General Risks and Complications: These are associated to most interventional treatments. They can occur alone, or in combination. They fall under one of the following six (6) categories: no benefit or worsening of symptoms; bleeding; infection; nerve damage; allergic reactions; and/or death. No benefits or worsening of symptoms: In Medicine there are no guarantees, only probabilities. No healthcare provider can ever guarantee that a medical treatment will work, they can only state the probability that it may. Furthermore, there is always the possibility that the condition may worsen, either directly, or indirectly, as a consequence of the treatment. Bleeding: This is more common if the patient is taking a blood thinner, either prescription or over the counter (example: Goody Powders, Fish oil, Aspirin, Garlic, etc.), or if suffering a condition associated with impaired coagulation (example: Hemophilia, cirrhosis of the liver, low platelet counts, etc.). However, even if you do not have one on these, it can still happen. If you have any of these conditions, or take one of these drugs, make sure to notify your treating physician. Infection: This is more common in patients with a compromised immune system, either due to disease (example: diabetes, cancer, human immunodeficiency virus [HIV], etc.), or due to medications or treatments (example: therapies used to treat cancer and rheumatological diseases). However, even if you do not have one on these, it can still happen. If you have any of these conditions, or  take one of these drugs, make sure to notify  your treating physician. Nerve Damage: This is more common when the treatment is an invasive one, but it can also happen with the use of medications, such as those used in the treatment of cancer. The damage can occur to small secondary nerves, or to large primary ones, such as those in the spinal cord and brain. This damage may be temporary or permanent and it may lead to impairments that can range from temporary numbness to permanent paralysis and/or brain death. Allergic Reactions: Any time a substance or material comes in contact with our body, there is the possibility of an allergic reaction. These can range from a mild skin rash (contact dermatitis) to a severe systemic reaction (anaphylactic reaction), which can result in death. Death: In general, any medical intervention can result in death, most of the time due to an unforeseen complication. ____________________________________________________________________________________________    ____________________________________________________________________________________________  Opioid Pain Medication Update  To: All patients taking opioid pain medications. (I.e.: hydrocodone, hydromorphone, oxycodone, oxymorphone, morphine, codeine, methadone, tapentadol, tramadol, buprenorphine, fentanyl, etc.)  Re: Updated review of side effects and adverse reactions of opioid analgesics, as well as new information about long term effects of this class of medications.  Direct risks of long-term opioid therapy are not limited to opioid addiction and overdose. Potential medical risks include serious fractures, breathing problems during sleep, hyperalgesia, immunosuppression, chronic constipation, bowel obstruction, myocardial infarction, and tooth decay secondary to xerostomia.  Unpredictable adverse effects that can occur even if you take your medication correctly: Cognitive impairment, respiratory depression, and death. Most people think that if they  take their medication "correctly", and "as instructed", that they will be safe. Nothing could be farther from the truth. In reality, a significant amount of recorded deaths associated with the use of opioids has occurred in individuals that had taken the medication for a long time, and were taking their medication correctly. The following are examples of how this can happen: Patient taking his/her medication for a long time, as instructed, without any side effects, is given a certain antibiotic or another unrelated medication, which in turn triggers a "Drug-to-drug interaction" leading to disorientation, cognitive impairment, impaired reflexes, respiratory depression or an untoward event leading to serious bodily harm or injury, including death.  Patient taking his/her medication for a long time, as instructed, without any side effects, develops an acute impairment of liver and/or kidney function. This will lead to a rapid inability of the body to breakdown and eliminate their pain medication, which will result in effects similar to an "overdose", but with the same medicine and dose that they had always taken. This again may lead to disorientation, cognitive impairment, impaired reflexes, respiratory depression or an untoward event leading to serious bodily harm or injury, including death.  A similar problem will occur with patients as they grow older and their liver and kidney function begins to decrease as part of the aging process.  Background information: Historically, the original case for using long-term opioid therapy to treat chronic noncancer pain was based on safety assumptions that subsequent experience has called into question. In 1996, the American Pain Society and the American Academy of Pain Medicine issued a consensus statement supporting long-term opioid therapy. This statement acknowledged the dangers of opioid prescribing but concluded that the risk for addiction was low; respiratory  depression induced by opioids was short-lived, occurred mainly in opioid-naive patients, and was antagonized by pain; tolerance was not a common problem; and efforts to  control diversion should not constrain opioid prescribing. This has now proven to be wrong. Experience regarding the risks for opioid addiction, misuse, and overdose in community practice has failed to support these assumptions.  According to the Centers for Disease Control and Prevention, fatal overdoses involving opioid analgesics have increased sharply over the past decade. Currently, more than 96,700 people die from drug overdoses every year. Opioids are a factor in 7 out of every 10 overdose deaths. Deaths from drug overdose have surpassed motor vehicle accidents as the leading cause of death for individuals between the ages of 94 and 67.  Clinical data suggest that neuroendocrine dysfunction may be very common in both men and women, potentially causing hypogonadism, erectile dysfunction, infertility, decreased libido, osteoporosis, and depression. Recent studies linked higher opioid dose to increased opioid-related mortality. Controlled observational studies reported that long-term opioid therapy may be associated with increased risk for cardiovascular events. Subsequent meta-analysis concluded that the safety of long-term opioid therapy in elderly patients has not been proven.   Side Effects and adverse reactions: Common side effects: Drowsiness (sedation). Dizziness. Nausea and vomiting. Constipation. Physical dependence -- Dependence often manifests with withdrawal symptoms when opioids are discontinued or decreased. Tolerance -- As you take repeated doses of opioids, you require increased medication to experience the same effect of pain relief. Respiratory depression -- This can occur in healthy people, especially with higher doses. However, people with COPD, asthma or other lung conditions may be even more susceptible to  fatal respiratory impairment.  Uncommon side effects: An increased sensitivity to feeling pain and extreme response to pain (hyperalgesia). Chronic use of opioids can lead to this. Delayed gastric emptying (the process by which the contents of your stomach are moved into your small intestine). Muscle rigidity. Immune system and hormonal dysfunction. Quick, involuntary muscle jerks (myoclonus). Arrhythmia. Itchy skin (pruritus). Dry mouth (xerostomia).  Long-term side effects: Chronic constipation. Sleep-disordered breathing (SDB). Increased risk of bone fractures. Hypothalamic-pituitary-adrenal dysregulation. Increased risk of overdose.  RISKS: Respiratory depression and death: Opioids increase the risk of respiratory depression and death.  Drug-to-drug interactions: Opioids are relatively contraindicated in combination with benzodiazepines, sleep inducers, and other central nervous system depressants. Other classes of medications (i.e.: certain antibiotics and even over-the-counter medications) may also trigger or induce respiratory depression in some patients.  Medical conditions: Patients with pre-existing respiratory problems are at higher risk of respiratory failure and/or depression when in combination with opioid analgesics. Opioids are relatively contraindicated in some medical conditions such as central sleep apnea.   Fractures and Falls:  Opioids increase the risk and incidence of falls. This is of particular importance in elderly patients.  Endocrine System:  Long-term administration is associated with endocrine abnormalities (endocrinopathies). (Also known as Opioid-induced Endocrinopathy) Influences on both the hypothalamic-pituitary-adrenal axis?and the hypothalamic-pituitary-gonadal axis have been demonstrated with consequent hypogonadism and adrenal insufficiency in both sexes. Hypogonadism and decreased levels of dehydroepiandrosterone sulfate have been reported in men  and women. Endocrine effects include: Amenorrhoea in women (abnormal absence of menstruation) Reduced libido in both sexes Decreased sexual function Erectile dysfunction in men Hypogonadisms (decreased testicular function with shrinkage of testicles) Infertility Depression and fatigue Loss of muscle mass Anxiety Depression Immune suppression Hyperalgesia Weight gain Anemia Osteoporosis Patients (particularly women of childbearing age) should avoid opioids. There is insufficient evidence to recommend routine monitoring of asymptomatic patients taking opioids in the long-term for hormonal deficiencies.  Immune System: Human studies have demonstrated that opioids have an immunomodulating effect. These effects are mediated via opioid receptors  both on immune effector cells and in the central nervous system. Opioids have been demonstrated to have adverse effects on antimicrobial response and anti-tumour surveillance. Buprenorphine has been demonstrated to have no impact on immune function.  Opioid Induced Hyperalgesia: Human studies have demonstrated that prolonged use of opioids can lead to a state of abnormal pain sensitivity, sometimes called opioid induced hyperalgesia (OIH). Opioid induced hyperalgesia is not usually seen in the absence of tolerance to opioid analgesia. Clinically, hyperalgesia may be diagnosed if the patient on long-term opioid therapy presents with increased pain. This might be qualitatively and anatomically distinct from pain related to disease progression or to breakthrough pain resulting from development of opioid tolerance. Pain associated with hyperalgesia tends to be more diffuse than the pre-existing pain and less defined in quality. Management of opioid induced hyperalgesia requires opioid dose reduction.  Cancer: Chronic opioid therapy has been associated with an increased risk of cancer among noncancer patients with chronic pain. This association was more  evident in chronic strong opioid users. Chronic opioid consumption causes significant pathological changes in the small intestine and colon. Epidemiological studies have found that there is a link between opium dependence and initiation of gastrointestinal cancers. Cancer is the second leading cause of death after cardiovascular disease. Chronic use of opioids can cause multiple conditions such as GERD, immunosuppression and renal damage as well as carcinogenic effects, which are associated with the incidence of cancers.   Mortality: Long-term opioid use has been associated with increased mortality among patients with chronic non-cancer pain (CNCP).  Prescription of long-acting opioids for chronic noncancer pain was associated with a significantly increased risk of all-cause mortality, including deaths from causes other than overdose.  Reference: Von Korff M, Kolodny A, Deyo RA, Chou R. Long-term opioid therapy reconsidered. Ann Intern Med. 2011 Sep 6;155(5):325-8. doi: 10.7326/[id number]. PMID: 16109604; PMCID: [id number]. Randon Goldsmith, Hayward RA, Dunn KM, [place] KP. Risk of adverse events in patients prescribed long-term opioids: A cohort study in the [place] [facility] [employer]. Eur J Pain. 2019 May;23(5):908-922. doi: 10.1002/ejp.1357. Epub 2019 Jan 31. PMID: 78295621. Colameco S, Coren JS, [place]o CA. Continuous opioid treatment for chronic noncancer pain: a time for moderation in prescribing. Postgrad Med. 2009 Jul;121(4):61-6. doi: 10.3810/pgm.2009.07.2032. PMID: 30865784. William Hamburger RN, [place], Blazina I, Cristopher [place], Bougatsos C, Deyo RA. The effectiveness and risks of long-term opioid therapy for chronic pain: a systematic review for a [employer] to [employer]. Ann Intern Med. 2015 Feb 17;162(4):276-86. doi: 10.7326/M14-2559. PMID: 69629528. Caryl Bis [facility], [place] DM. NCHS Data Brief  No. 22. [place]: Centers for Disease Control and Prevention; 2009. Sep, Increase in Fatal Poisonings Involving Opioid Analgesics in the [place], 1999-2006. Song IA, Choi HR, Oh TK. Long-term opioid use and mortality in patients with chronic non-cancer pain: Ten-year follow-up study in [place] from 2010 through 2019. EClinicalMedicine. 2022 Jul 18;51:101558. doi: 10.1016/j.eclinm.[id number]. PMID: 01027253; PMCID: [id number]. Huser, W., Schubert, T., Vogelmann, T. et al. All-cause mortality in patients with long-term opioid therapy compared with non-opioid analgesics for chronic non-cancer pain: a database study. BMC Med 18, 162 (2020). http://lester.info/ Rashidian H, Karie Kirks, Malekzadeh R, Haghdoost AA. An Ecological Study of the Association between Opiate Use and Incidence of Cancers. Addict [employer]. 2016 Fall;8(4):252-260. PMID: [id number]; PMCID: [id number].  Our Goal: Our goal is to control your pain with means other than the use of opioid pain medications.  Our Recommendation: Talk to your  physician about coming off of these medications. We can assist you with the tapering down and stopping these medicines. Based on the new information, even if you cannot completely stop the medication, a decrease in the dose may be associated with a lesser risk. Ask for other means of controlling the pain. Decrease or eliminate those factors that significantly contribute to your pain such as smoking, obesity, and a diet heavily tilted towards "inflammatory" nutrients.  Last Updated: 01/27/2023   ____________________________________________________________________________________________     ____________________________________________________________________________________________  National Pain Medication Shortage  The [place] is experiencing worsening drug shortages. These have had a negative widespread effect on patient care and treatment. Not expected to  improve any time soon. Predicted to last past 2029.   Drug shortage list (generic names) Oxycodone IR Oxycodone/APAP Oxymorphone IR Hydromorphone Hydrocodone/APAP Morphine  Where is the problem?  Manufacturing and supply level.  Will this shortage affect you?  Only if you take any of the above pain medications.  How? You may be unable to fill your prescription.  Your pharmacist may offer a "partial fill" of your prescription. (Warning: Do not accept partial fills.) Prescriptions partially filled cannot be transferred to another pharmacy. Read our Medication Rules and Regulation. Depending on how much medicine you are dependent on, you may experience withdrawals when unable to get the medication.  Recommendations: Consider ending your dependence on opioid pain medications. Ask your pain specialist to assist you with the process. Consider switching to a medication currently not in shortage, such as Buprenorphine. Talk to your pain specialist about this option. Consider decreasing your pain medication requirements by managing tolerance thru "Drug Holidays". This may help minimize withdrawals, should you run out of medicine. Control your pain thru the use of non-pharmacological interventional therapies.   Your prescriber: Prescribers cannot be blamed for shortages. Medication manufacturing and supply issues cannot be fixed by the prescriber.   NOTE: The prescriber is not responsible for supplying the medication, or solving supply issues. Work with your pharmacist to solve it. The patient is responsible for the decision to take or continue taking the medication and for identifying and securing a legal supply source. By law, supplying the medication is the job and responsibility of the pharmacy. The prescriber is responsible for the evaluation, monitoring, and prescribing of these medications.   Prescribers will NOT: Re-issue prescriptions that have been partially filled. Re-issue  prescriptions already sent to a pharmacy.  Re-send prescriptions to a different pharmacy because yours did not have your medication. Ask pharmacist to order more medicine or transfer the prescription to another pharmacy. (Read below.)  New 2023 regulation: "March 22, 2022 Revised Regulation Allows DEA-Registered Pharmacies to Transfer Electronic Prescriptions at a Patient's Request DEA Headquarters Division - Public Information Office Patients now have the ability to request their electronic prescription be transferred to another pharmacy without having to go back to their practitioner to initiate the request. This revised regulation went into effect on Monday, March 18, 2022.     At a patient's request, a DEA-registered retail pharmacy can now transfer an electronic prescription for a controlled substance (schedules II-V) to another DEA-registered retail pharmacy. Prior to this change, patients would have to go through their practitioner to cancel their prescription and have it re-issued to a different pharmacy. The process was taxing and time consuming for both patients and practitioners.    The Drug Enforcement Administration [facility]) published its intent to revise the process for transferring electronic prescriptions on June 09, 2020.  The final rule  was published in the federal register on February 14, 2022 and went into effect 30 days later.  Under the final rule, a prescription can only be transferred once between pharmacies, and only if allowed under existing state or other applicable law. The prescription must remain in its electronic form; may not be altered in any way; and the transfer must be communicated directly between two licensed pharmacists. It's important to note, any authorized refills transfer with the original prescription, which means the entire prescription will be filled at the same pharmacy".   Reference: [email or web address] [facility] website announcement)  [email or web address] Financial planner of Justice)   [employer] / Vol. 88, No. 143 / Thursday, February 14, 2022 / Rules and Regulations DEPARTMENT OF JUSTICE  Drug Enforcement Administration  21 CFR Part 1306  [Docket No. [id number]]  RIN [id number] Transfer of Electronic Prescriptions for Schedules II-V Controlled Substances Between Pharmacies for Initial Filling  ____________________________________________________________________________________________     ____________________________________________________________________________________________  Transfer of Pain Medication between Pharmacies  Re: 2023 DEA Clarification on existing regulation  Published on DEA Website: March 22, 2022  Title: Revised Regulation Allows DEA-Registered Pharmacies to Electrical engineer Prescriptions at a Patient's Request DEA Headquarters Division - [employer]  "Patients now have the ability to request their electronic prescription be transferred to another pharmacy without having to go back to their practitioner to initiate the request. This revised regulation went into effect on Monday, March 18, 2022.     At a patient's request, a DEA-registered retail pharmacy can now transfer an electronic prescription for a controlled substance (schedules II-V) to another DEA-registered retail pharmacy. Prior to this change, patients would have to go through their practitioner to cancel their prescription and have it re-issued to a different pharmacy. The process was taxing and time consuming for both patients and practitioners.    The Drug Enforcement Administration [facility]) published its intent to revise the process for transferring electronic prescriptions on June 09, 2020.   The final rule was published in the federal register on February 14, 2022 and went into effect 30 days later.  Under the final rule, a prescription can only be transferred once between pharmacies, and only if allowed under existing state or other applicable law. The prescription must remain in its electronic form; may not be altered in any way; and the transfer must be communicated directly between two licensed pharmacists. It's important to note, any authorized refills transfer with the original prescription, which means the entire prescription will be filled at the same pharmacy."    REFERENCES: 1. DEA website announcement [email or web address]  2. Department of Justice website  [email or web address]  3. DEPARTMENT OF JUSTICE Drug Enforcement Administration 21 CFR Part 1306 [Docket No. [id number]] RIN [id number]-AB64 "Transfer of Electronic Prescriptions for Schedules II-V Controlled Substances Between Pharmacies for Initial Filling"  ____________________________________________________________________________________________     _______________________________________________________________________  Medication Rules  Purpose: To inform patients, and their family members, of our medication rules and regulations.  Applies to: All patients receiving prescriptions from our practice (written or electronic).  Pharmacy of record: This is the pharmacy where your electronic prescriptions will be sent. Make sure we have the correct one.  Electronic prescriptions: In compliance with the [facility] Strengthen Opioid Misuse Prevention (STOP) Act of 2017 (Session Conni Elliot [phone]), effective July 22, 2018, all controlled substances must be electronically prescribed. Written prescriptions, faxing, or calling prescriptions to a pharmacy will no longer be  done.  Prescription refills: These will be provided only during in-person appointments. No  medications will be renewed without a "face-to-face" evaluation with your provider. Applies to all prescriptions.  NOTE: The following applies primarily to controlled substances (Opioid* Pain Medications).   Type of encounter (visit): For patients receiving controlled substances, face-to-face visits are required. (Not an option and not up to the patient.)  Patient's responsibilities: Pain Pills: Bring all pain pills to every appointment (except for procedure appointments). Pill Bottles: Bring pills in original pharmacy bottle. Bring bottle, even if empty. Always bring the bottle of the most recent fill.  Medication refills: You are responsible for knowing and keeping track of what medications you are taking and when is it that you will need a refill. The day before your appointment: write a list of all prescriptions that need to be refilled. The day of the appointment: give the list to the admitting nurse. Prescriptions will be written only during appointments. No prescriptions will be written on procedure days. If you forget a medication: it will not be "Called in", "Faxed", or "electronically sent". You will need to get another appointment to get these prescribed. No early refills. Do not call asking to have your prescription filled early. Partial  or short prescriptions: Occasionally your pharmacy may not have enough pills to fill your prescription.  NEVER ACCEPT a partial fill or a prescription that is short of the total amount of pills that you were prescribed.  With controlled substances the law allows 72 hours for the pharmacy to complete the prescription.  If the prescription is not completed within 72 hours, the pharmacist will require a new prescription to be written. This means that you will be short on your medicine and we WILL NOT send another prescription to complete your original prescription.   Instead, request the pharmacy to send a carrier to a nearby branch to get enough medication to provide you with your full prescription. Prescription Accuracy: You are responsible for carefully inspecting your prescriptions before leaving our office. Have the discharge nurse carefully go over each prescription with you, before taking them home. Make sure that your name is accurately spelled, that your address is correct. Check the name and dose of your medication to make sure it is accurate. Check the number of pills, and the written instructions to make sure they are clear and accurate. Make sure that you are given enough medication to last until your next medication refill appointment. Taking Medication: Take medication as prescribed. When it comes to controlled substances, taking less pills or less frequently than prescribed is permitted and encouraged. Never take more pills than instructed. Never take the medication more frequently than prescribed.  Inform other Doctors: Always inform, all of your healthcare providers, of all the medications you take. Pain Medication from other Providers: You are not allowed to accept any additional pain medication from any other Doctor or Healthcare provider. There are two exceptions to this rule. (see below) In the event that you require additional pain medication, you are responsible for notifying us, as stated below. Cough Medicine: Often these contain an opioid, such as codeine or hydrocodone. Never accept or take cough medicine containing these opioids if you are already taking an opioid* medication. The combination may cause respiratory failure and death. Medication Agreement: You are responsible for carefully reading and following our Medication Agreement. This must be signed before receiving any prescriptions from our practice. Safely store a copy of your signed Agreement. Violations to the Agreement will result in no further prescriptions. (Additional copies of  our Medication Agreement are  available upon request.) Laws, Rules, & Regulations: All patients are expected to follow all [address] and [employer], Rules, [place] [employer]. Ignorance of the Laws does not constitute a valid excuse.  Illegal drugs and Controlled Substances: The use of illegal substances (including, but not limited to marijuana and its derivatives) and/or the illegal use of any controlled substances is strictly prohibited. Violation of this rule may result in the immediate and permanent discontinuation of any and all prescriptions being written by our practice. The use of any illegal substances is prohibited. Adopted CDC guidelines & recommendations: Target dosing levels will be at or below 60 MME/day. Use of benzodiazepines** is not recommended.  Exceptions: There are only two exceptions to the rule of not receiving pain medications from other Healthcare Providers. Exception #1 (Emergencies): In the event of an emergency (i.e.: accident requiring emergency care), you are allowed to receive additional pain medication. However, you are responsible for: As soon as you are able, call our office [phone], at any time of the day or night, and leave a message stating your name, the date and nature of the emergency, and the name and dose of the medication prescribed. In the event that your call is answered by a member of our staff, make sure to document and save the date, time, and the name of the person that took your information.  Exception #2 (Planned Surgery): In the event that you are scheduled by another doctor or dentist to have any type of surgery or procedure, you are allowed (for a period no longer than 30 days), to receive additional pain medication, for the acute post-op pain. However, in this case, you are responsible for picking up a copy of our "Post-op Pain Management for Surgeons" handout, and giving it to your surgeon or dentist. This document is available at our office,  and does not require an appointment to obtain it. Simply go to our office during business hours (Monday-Thursday from 8:00 AM to 4:00 PM) (Friday 8:00 AM to 12:00 Noon) or if you have a scheduled appointment with [place], prior to your surgery, and ask for it by name. In addition, you are responsible for: calling our office [phone], at any time of the day or night, and leaving a message stating your name, name of your surgeon, type of surgery, and date of procedure or surgery. Failure to comply with your responsibilities may result in termination of therapy involving the controlled substances. Medication Agreement Violation. Following the above rules, including your responsibilities will help you in avoiding a Medication Agreement Violation ("Breaking your Pain Medication Contract").  Consequences:  Not following the above rules may result in permanent discontinuation of medication prescription therapy.  *Opioid medications include: morphine, codeine, oxycodone, oxymorphone, hydrocodone, hydromorphone, meperidine, tramadol, tapentadol, buprenorphine, fentanyl, methadone. **Benzodiazepine medications include: diazepam (Valium), alprazolam (Xanax), clonazepam (Klonopine), lorazepam (Ativan), clorazepate (Tranxene), chlordiazepoxide (Librium), estazolam (Prosom), oxazepam (Serax), temazepam (Restoril), triazolam (Halcion) (Last updated: 05/14/2022) ______________________________________________________________________    ______________________________________________________________________  Medication Recommendations and Reminders  Applies to: All patients receiving prescriptions (written and/or electronic).  Medication Rules & Regulations: You are responsible for reading, knowing, and following our "Medication Rules" document. These exist for your safety and that of others. They are not flexible and neither are we. Dismissing or ignoring them is an act of "non-compliance" that may result in  complete and irreversible termination of such medication therapy. For safety reasons, "non-compliance" will not be tolerated. As with the [place] fundamental legal principle of "ignorance of the law  is no defense", we will accept no excuses for not having read and knowing the content of documents provided to you by our practice.  Pharmacy of record:  Definition: This is the pharmacy where your electronic prescriptions will be sent.  We do not endorse any particular pharmacy. It is up to you and your insurance to decide what pharmacy to use.  We do not restrict you in your choice of pharmacy. However, once we write for your prescriptions, we will NOT be re-sending more prescriptions to fix restricted supply problems created by your pharmacy, or your insurance.  The pharmacy listed in the electronic medical record should be the one where you want electronic prescriptions to be sent. If you choose to change pharmacy, simply notify our nursing staff. Changes will be made only during your regular appointments and not over the phone.  Recommendations: Keep all of your pain medications in a safe place, under lock and key, even if you live alone. We will NOT replace lost, stolen, or damaged medication. We do not accept "Police Reports" as proof of medications having been stolen. After you fill your prescription, take 1 week's worth of pills and put them away in a safe place. You should keep a separate, properly labeled bottle for this purpose. The remainder should be kept in the original bottle. Use this as your primary supply, until it runs out. Once it's gone, then you know that you have 1 week's worth of medicine, and it is time to come in for a prescription refill. If you do this correctly, it is unlikely that you will ever run out of medicine. To make sure that the above recommendation works, it is very important that you make sure your medication refill appointments are scheduled at least 1 week before you  run out of medicine. To do this in an effective manner, make sure that you do not leave the office without scheduling your next medication management appointment. Always ask the nursing staff to show you in your prescription , when your medication will be running out. Then arrange for the receptionist to get you a return appointment, at least 7 days before you run out of medicine. Do not wait until you have 1 or 2 pills left, to come in. This is very poor planning and does not take into consideration that we may need to cancel appointments due to bad weather, sickness, or emergencies affecting our staff. DO NOT ACCEPT A "Partial Fill": If for any reason your pharmacy does not have enough pills/tablets to completely fill or refill your prescription, do not allow for a "partial fill". The law allows the pharmacy to complete that prescription within 72 hours, without requiring a new prescription. If they do not fill the rest of your prescription within those 72 hours, you will need a separate prescription to fill the remaining amount, which we will NOT provide. If the reason for the partial fill is your insurance, you will need to talk to the pharmacist about payment alternatives for the remaining tablets, but again, DO NOT ACCEPT A PARTIAL FILL, unless you can trust your pharmacist to obtain the remainder of the pills within 72 hours.  Prescription refills and/or changes in medication(s):  Prescription refills, and/or changes in dose or medication, will be conducted only during scheduled medication management appointments. (Applies to both, written and electronic prescriptions.) No refills on procedure days. No medication will be changed or started on procedure days. No changes, adjustments, and/or refills will be conducted on  a procedure day. Doing so will interfere with the diagnostic portion of the procedure. No phone refills. No medications will be "called into the pharmacy". No Fax refills. No weekend  refills. No Holliday refills. No after hours refills.  Remember:  Business hours are:  Monday to Thursday 8:00 AM to 4:00 PM Provider's Schedule: Delano Metz, MD - Appointments are:  Medication management: Monday and Wednesday 8:00 AM to 4:00 PM Procedure day: Tuesday and Thursday 7:30 AM to 4:00 PM Edward Jolly, MD - Appointments are:  Medication management: Tuesday and Thursday 8:00 AM to 4:00 PM Procedure day: Monday and Wednesday 7:30 AM to 4:00 PM (Last update: 05/14/2022) ______________________________________________________________________    ____________________________________________________________________________________________  Drug Holidays  What is a "Drug Holiday"? Drug Holiday: is the name given to the process of slowly tapering down and temporarily stopping the pain medication for the purpose of decreasing or eliminating tolerance to the drug.  Benefits Improved effectiveness Decreased required effective dose Improved pain control End dependence on high dose therapy Decrease cost of therapy Uncovering "opioid-induced hyperalgesia". (OIH)  What is "opioid hyperalgesia"? It is a paradoxical increase in pain caused by exposure to opioids. Stopping the opioid pain medication, contrary to the expected, it actually decreases or completely eliminates the pain. Ref.: "A comprehensive review of opioid-induced hyperalgesia". Donney Rankins, et.al. Pain Physician. 2011 Mar-Apr;14(2):145-61.  What is tolerance? Tolerance: the progressive loss of effectiveness of a pain medicine due to repetitive use. A common problem of opioid pain medications.  How long should a "Drug Holiday" last? Effectiveness depends on the patient staying off all opioid pain medicines for a minimum of 14 consecutive days. (2 weeks)  How about just taking less of the medicine? Does not work. Will not accomplish goal of eliminating the excess receptors.  How about switching to a different  pain medicine? (AKA. "Opioid rotation") Does not work. Creates the illusion of effectiveness by taking advantage of inaccurate equivalent dose calculations between different opioids. -This "technique" was promoted by studies funded by [employer], such as [employer], creators of "OxyContin".  Can I stop the medicine "cold [place]"? We do not recommend it. You should always coordinate with your prescribing physician to make the transition as smoothly as possible. Avoid stopping the medicine abruptly without consulting. We recommend a "slow taper".  What is a slow taper? Taper: refers to the gradual decrease in dose.   How do I stop/taper the dose? Slowly. Decrease the daily amount of pills that you take by one (1) pill every seven (7) days. This is called a "slow downward taper". Example: if you normally take four (4) pills per day, drop it to three (3) pills per day for seven (7) days, then to two (2) pills per day for seven (7) days, then to one (1) per day for seven (7) days, and then stop the medicine. The 14 day "Drug Holiday" starts on the first day without medicine.   Will I experience withdrawals? Unlikely with a slow taper.  What triggers withdrawals? Withdrawals are triggered by the sudden/abrupt stop of high dose opioids. Withdrawals can be avoided by slowly decreasing the dose over a prolonged period of time.  What are withdrawals? Symptoms associated with sudden/abrupt reduction/stopping of high-dose, long-term use of pain medication. Withdrawal are seldom seen on low dose therapy, or patients rarely taking opioid medication.  Early Withdrawal Symptoms may include: Agitation Anxiety Muscle aches Increased tearing Insomnia Runny nose Sweating Yawning  Late symptoms may include: Abdominal cramping Diarrhea Dilated pupils Goose  bumps Nausea Vomiting  When could I see withdrawals? Onset: 8-24 hours after last use for most opioids. 12-48 hours for  long-acting opioids (i.e.: methadone)  How long could they last? Duration: 4-10 days for most opioids. 14-21 days for long-acting opioids (i.e.: methadone)  What will happen after I complete my "Drug Holiday"? The need and indications for the opioid analgesic will be reviewed before restarting the medication. Dose requirements will likely decrease and the dose will need to be adjusted accordingly.   (Last update: 10/09/2022) ____________________________________________________________________________________________   ____________________________________________________________________________________________  Naloxone Nasal Spray  Why am I receiving this medication? [employer] [place] STOP ACT requires that all patients taking high dose opioids or at risk of opioids respiratory depression, be prescribed an opioid reversal agent, such as Naloxone (AKA: Narcan).  What is this medication? NALOXONE (nal OX one) treats opioid overdose, which causes slow or shallow breathing, severe drowsiness, or trouble staying awake. Call emergency services after using this medication. You may need additional treatment. Naloxone works by reversing the effects of opioids. It belongs to a group of medications called opioid blockers.  COMMON BRAND NAME(S): Kloxxado, Narcan  What should I tell my care team before I take this medication? They need to know if you have any of these conditions: Heart disease Substance use disorder An unusual or allergic reaction to naloxone, other medications, foods, dyes, or preservatives Pregnant or trying to get pregnant Breast-feeding  When to use this medication? This medication is to be used for the treatment of respiratory depression (less than 8 breaths per minute) secondary to opioid overdose.   How to use this medication? This medication is for use in the nose. Lay the person on their back. Support their neck with your hand and allow the head to tilt back before giving  the medication. The nasal spray should be given into 1 nostril. After giving the medication, move the person onto their side. Do not remove or test the nasal spray until ready to use. Get emergency medical help right away after giving the first dose of this medication, even if the person wakes up. You should be familiar with how to recognize the signs and symptoms of a narcotic overdose. If more doses are needed, give the additional dose in the other nostril. Talk to your care team about the use of this medication in children. While this medication may be prescribed for children as young as newborns for selected conditions, precautions do apply.  Naloxone Overdosage: If you think you have taken too much of this medicine contact a poison control center or emergency room at once.  NOTE: This medicine is only for you. Do not share this medicine with others.  What if I miss a dose? This does not apply.  What may interact with this medication? This is only used during an emergency. No interactions are expected during emergency use. This list may not describe all possible interactions. Give your health care provider a list of all the medicines, herbs, non-prescription drugs, or dietary supplements you use. Also tell them if you smoke, drink alcohol, or use illegal drugs. Some items may interact with your medicine.  What should I watch for while using this medication? Keep this medication ready for use in the case of an opioid overdose. Make sure that you have the phone number of your care team and local hospital ready. You may need to have additional doses of this medication. Each nasal spray contains a single dose. Some emergencies may require additional doses.  After use, bring the treated person to the nearest hospital or call 911. Make sure the treating care team knows that the person has received a dose of this medication. You will receive additional instructions on what to do during and after use of  this medication before an emergency occurs.  What side effects may I notice from receiving this medication? Side effects that you should report to your care team as soon as possible: Allergic reactions--skin rash, itching, hives, swelling of the face, lips, tongue, or throat Side effects that usually do not require medical attention (report these to your care team if they continue or are bothersome): Constipation Dryness or irritation inside the nose Headache Increase in blood pressure Muscle spasms Stuffy nose Toothache This list may not describe all possible side effects. Call your doctor for medical advice about side effects. You may report side effects to FDA at 1-800-FDA-1088.  Where should I keep my medication? Because this is an emergency medication, you should keep it with you at all times.  Keep out of the reach of children and pets. Store between 20 and 25 degrees C (68 and 77 degrees F). Do not freeze. Throw away any unused medication after the expiration date. Keep in original box until ready to use.  NOTE: This sheet is a summary. It may not cover all possible information. If you have questions about this medicine, talk to your doctor, pharmacist, or health care provider.   2023 Elsevier/Gold Standard (2021-03-16 00:00:00)  ____________________________________________________________________________________________  ____________________________________________________________________________________________  Patient Information update  To: All of our patients.  Re: Name change.  It has been made official that our current name, "[facility]"   will soon be changed to "[facility]".   The purpose of this change is to eliminate any confusion created by the concept of our practice being a "Medication Management Pain Clinic". In the past this has led to the  misconception that we treat pain primarily by the use of prescription medications.  Nothing can be farther from the truth.   Understanding PAIN MANAGEMENT: To further understand what our practice does, you first have to understand that "Pain Management" is a subspecialty that requires additional training once a physician has completed their specialty training, which can be in either Anesthesia, Neurology, Psychiatry, or Physical Medicine and Rehabilitation (PMR). Each one of these contributes to the final approach taken by each physician to the management of their patient's pain. To be a "Pain Management Specialist" you must have first completed one of the specialty trainings below.  Anesthesiologists - trained in clinical pharmacology and interventional techniques such as nerve blockade and regional as well as central neuroanatomy. They are trained to block pain before, during, and after surgical interventions.  Neurologists - trained in the diagnosis and pharmacological treatment of complex neurological conditions, such as Multiple Sclerosis, Parkinson's, spinal cord injuries, and other systemic conditions that may be associated with symptoms that may include but are not limited to pain. They tend to rely primarily on the treatment of chronic pain using prescription medications.  Psychiatrist - trained in conditions affecting the psychosocial wellbeing of patients including but not limited to depression, anxiety, schizophrenia, personality disorders, addiction, and other substance use disorders that may be associated with chronic pain. They tend to rely primarily on the treatment of chronic pain using prescription medications.   Physical Medicine and Rehabilitation (PMR) physicians, also known as physiatrists - trained to treat a  wide variety of medical conditions affecting the brain, spinal cord, nerves, bones, joints, ligaments, muscles, and tendons. Their training is primarily aimed at treating  patients that have suffered injuries that have caused severe physical impairment. Their training is primarily aimed at the physical therapy and rehabilitation of those patients. They may also work alongside orthopedic surgeons or neurosurgeons using their expertise in assisting surgical patients to recover after their surgeries.  INTERVENTIONAL PAIN MANAGEMENT is sub-subspecialty of Pain Management.  Our physicians are Board-certified in Anesthesia, Pain Management, and Interventional Pain Management.  This meaning that not only have they been trained and Board-certified in their specialty of Anesthesia, and subspecialty of Pain Management, but they have also received further training in the sub-subspecialty of Interventional Pain Management, in order to become Board-certified as INTERVENTIONAL PAIN MANAGEMENT SPECIALIST.    Mission: Our goal is to use our skills in  INTERVENTIONAL PAIN MANAGEMENT as alternatives to the chronic use of prescription opioid medications for the treatment of pain. To make this more clear, we have changed our name to reflect what we do and offer. We will continue to offer medication management assessment and recommendations, but we will not be taking over any patient's medication management.  ____________________________________________________________________________________________

## 2023-02-18 ENCOUNTER — Other Ambulatory Visit: Payer: Self-pay | Admitting: Internal Medicine

## 2023-02-18 DIAGNOSIS — R1084 Generalized abdominal pain: Secondary | ICD-10-CM

## 2023-02-19 ENCOUNTER — Ambulatory Visit: Admission: RE | Admit: 2023-02-19 | Payer: Medicare (Managed Care) | Source: Ambulatory Visit

## 2023-02-20 ENCOUNTER — Telehealth: Payer: Self-pay

## 2023-02-20 NOTE — Telephone Encounter (Signed)
Insurance Treatment Denial Note  Date order was entered:  Order entered by: Delano Metz, MD Requested treatment: lumbar facets Reason for denial: Documentation of pertinent physical exam is missing. Recommended for approval:  see below    The letter states Your doctor told us you have pain coming from a joint in your low back. A shot to treat this was requested. We cannot approve the request. The notes do not show: Pain that is mostly in the low back that does not travel to other areas of the body.   I left the patient a vm letting him know it was denied and that I was reaching out to you to see what we want to do next.

## 2023-02-24 ENCOUNTER — Other Ambulatory Visit: Payer: Self-pay

## 2023-02-24 ENCOUNTER — Emergency Department: Payer: Medicare (Managed Care)

## 2023-02-24 ENCOUNTER — Emergency Department
Admission: EM | Admit: 2023-02-24 | Discharge: 2023-02-24 | Disposition: A | Discharge status: Home / Self Care | Payer: Medicare (Managed Care) | Attending: Emergency Medicine | Admitting: Emergency Medicine

## 2023-02-24 ENCOUNTER — Encounter: Payer: Self-pay | Admitting: Emergency Medicine

## 2023-02-24 DIAGNOSIS — W108XXA Fall (on) (from) other stairs and steps, initial encounter: Secondary | ICD-10-CM | POA: Insufficient documentation

## 2023-02-24 DIAGNOSIS — R0781 Pleurodynia: Secondary | ICD-10-CM | POA: Insufficient documentation

## 2023-02-24 DIAGNOSIS — J449 Chronic obstructive pulmonary disease, unspecified: Secondary | ICD-10-CM | POA: Insufficient documentation

## 2023-02-24 DIAGNOSIS — W19XXXA Unspecified fall, initial encounter: Secondary | ICD-10-CM

## 2023-02-24 DIAGNOSIS — I2510 Atherosclerotic heart disease of native coronary artery without angina pectoris: Secondary | ICD-10-CM | POA: Diagnosis not present

## 2023-02-24 MED ORDER — LIDOCAINE 5 % EX PTCH
1.0000 | MEDICATED_PATCH | CUTANEOUS | Status: DC
  Administered 2023-02-24: 1 via TRANSDERMAL
  Filled 2023-02-24: qty 1

## 2023-02-24 NOTE — ED Triage Notes (Signed)
Patient to ED via POV for a fall. Patient states that on Thursday he fell form a step stool- landing on right ribs. Hurts to take deep breath or cough. Denies LOC or hitting head.

## 2023-02-24 NOTE — ED Provider Notes (Signed)
[facility] Provider Note    Event Date/Time   First MD Initiated Contact with Patient 02/24/23 1535     (approximate)   History   Fall   HPI  [name] is a 77 y.o. male  with PMH of CAD, COPD and chronic pain, who presents for evaluation after a fall on Thursday.  He fell off a stepstool and hit his right ribs.  Patient states he has had increasing pain since the fall.  He takes hydromorphone for his chronic pain and this has not helped with his symptoms.  He denies shortness of breath.  He says he has difficulty with movement and deep breathing.      Physical Exam   Triage Vital Signs: ED Triage Vitals  Encounter Vitals Group     BP 02/24/23 1425 120/75     Systolic BP Percentile --      Diastolic BP Percentile --      Pulse Rate 02/24/23 1425 78     Resp 02/24/23 1425 18     Temp 02/24/23 1425 98.1 F (36.7 C)     Temp Source 02/24/23 1425 Oral     SpO2 02/24/23 1425 92 %     Weight 02/24/23 1426 200 lb (90.7 kg)     Height 02/24/23 1426 6\' 3"  (1.905 m)     Head Circumference --      Peak Flow --      Pain Score 02/24/23 1426 8     Pain Loc --      Pain Education --      Exclude from Growth Chart --     Most recent vital signs: Vitals:   02/24/23 1425  BP: 120/75  Pulse: 78  Resp: 18  Temp: 98.1 F (36.7 C)  SpO2: 92%     General: Awake, no distress.  CV:  Good peripheral perfusion.  RRR. Resp:  Normal effort.  CTAB, pain with deep breaths. Abd:  No distention.    ED Results / Procedures / Treatments   Labs (all labs ordered are listed, but only abnormal results are displayed) Labs Reviewed - No data to display   RADIOLOGY  Right rib x-rays obtained, I interpreted the images as well as reviewed the radiologist report.    PROCEDURES:  Critical Care performed: No  Procedures   MEDICATIONS ORDERED IN ED: Medications  lidocaine (LIDODERM) 5 % 1 patch (has no administration in time range)      IMPRESSION / MDM / ASSESSMENT AND PLAN / ED COURSE  I reviewed the triage vital signs and the nursing notes.                             77 year old male presents for evaluation of right rib pain after a fall last week.  VSS in triage and patient NAD on exam.  Differential diagnosis includes, but is not limited to, rib fracture, rib contusion, pneumothorax, pneumonia.  Patient's presentation is most consistent with acute complicated illness / injury requiring diagnostic workup.  Right rib x-rays obtained, interpreted the images as well as reviewed the radiologist report.  There is no rib fracture or pneumothorax.  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  Since patient does not have a pneumothorax or fractured ribs they feel he is appropriate for outpatient management.  I advised patient on pain control with his hydromorphone and Tylenol.  Patient was given  a lidocaine patch before his discharge.  I explained that he can ice and heat his ribs as needed.  He can also try topicals if he would like.  I gave patient a incentive spirometer with instructions on how to use.  I emphasized the importance of using this daily to prevent development of pneumonia.  Patient voiced understanding, all questions were answered and he was stable at discharge.     FINAL CLINICAL IMPRESSION(S) / ED DIAGNOSES   Final diagnoses:  Fall, initial encounter     Rx / DC Orders   ED Discharge Orders     None        Note:  This document was prepared using Dragon voice recognition software and may include unintentional dictation errors.   Cameron Ali, PA-C 02/24/23 1610    Sharyn Creamer, MD 02/25/23 [id number]

## 2023-02-24 NOTE — Discharge Instructions (Addendum)
You can take 650 mg of Tylenol every 6 hours as needed for your pain.  I would recommend doing this consistently for the next few days.  You can also use topical pain relievers, ice and heat.  I have attached information on how to use the incentive spirometer.  It is very important that you use the incentive spirometer even though it will be very uncomfortable.  This is to help prevent the development of pneumonia.  Please return to the ED with any new or worsening symptoms.

## 2023-03-04 ENCOUNTER — Encounter (INDEPENDENT_AMBULATORY_CARE_PROVIDER_SITE_OTHER): Payer: Self-pay | Admitting: Vascular Surgery

## 2023-03-04 ENCOUNTER — Ambulatory Visit (INDEPENDENT_AMBULATORY_CARE_PROVIDER_SITE_OTHER): Payer: Medicare (Managed Care) | Admitting: Vascular Surgery

## 2023-03-04 ENCOUNTER — Ambulatory Visit (INDEPENDENT_AMBULATORY_CARE_PROVIDER_SITE_OTHER): Payer: Medicare (Managed Care)

## 2023-03-04 VITALS — BP 116/78 | HR 74 | Resp 18 | Ht 75.0 in | Wt 200.4 lb

## 2023-03-04 DIAGNOSIS — R29898 Other symptoms and signs involving the musculoskeletal system: Secondary | ICD-10-CM

## 2023-03-04 DIAGNOSIS — I10 Essential (primary) hypertension: Secondary | ICD-10-CM | POA: Diagnosis not present

## 2023-03-04 DIAGNOSIS — E118 Type 2 diabetes mellitus with unspecified complications: Secondary | ICD-10-CM | POA: Diagnosis not present

## 2023-03-04 DIAGNOSIS — I872 Venous insufficiency (chronic) (peripheral): Secondary | ICD-10-CM

## 2023-03-04 NOTE — Progress Notes (Signed)
Patient ID: [name], male   DOB: 25-Jun-1946, 77 y.o.   MRN: [id number]  Chief Complaint  Patient presents with   New Patient (Initial Visit)    NP consult & LE reflux R29.898 (ICD-10-CM) - Weakness of both lower extremities    HPI [name] is a 77 y.o. male.  I am asked to see the patient by Dr. Lalla Brothers for evaluation of his venous disease and severe stasis dermatitis.  I have seen the patient in the past, but he has not been in the office since 2019 before COVID.  He continues to have debilitating leg pain and issues with his back.  He has regularly seen the pain clinic and continues to do so.  He has had 2 intrathecal pumps and is scheduled for another procedure with the pain clinic next week.  He has had marked stasis dermatitis changes for many years.  He has severe leg pain.  He has undergone multiple venous interventions in the past.  A venous reflux study today showed no evidence of deep venous thrombosis or superficial thrombophlebitis.  No significant venous reflux were seen in the large veins but there were prominent varicosities present.  Spotcheck of the arterial flow in both lower extremities also showed normal triphasic and biphasic flow in the tibial vessels.     Past Medical History:  Diagnosis Date   Acute anterolateral wall MI (HCC) 2000   a.) PCI with stent x 1 to LCx (type unknown).   Acute non-Q wave non-ST elevation myocardial infarction (NSTEMI) (HCC) 12/03/2004   a.) LHC 12/04/2004: EF 50%; 75% pLAD, 30% pRCA, 30% mRCA --> PCI performed placing a 3.0 x 13 mm Cypher DES to pLAD.   Adenomatous polyp of colon    Agent orange exposure    a.) [place] War   Angina pectoris (HCC)    Anxiety    Aortic atherosclerosis (HCC)    Asthma    Bladder stones    Bradycardia    CHF (congestive heart failure) (HCC)    Chronic lower back pain    a.) post-laminectomy syndrome   Chronic pain syndrome    a.) on COT; has naloxone Rx in place   COPD (chronic obstructive  pulmonary disease) (HCC)    Coronary artery disease    a.) LHC 04/18/1987: normal cors.  b.) MI 2000 --> PCI with stent x 1 to LCx (unknown type). c.) LHC 02/03/2002: 20% pRCA, 20% mRCA-1, 20% mRCA-2, 50% mLAD; med mgmt. d.) NSTEMI 12/04/2004 --> LHC 75% pLAD, 30% pRCA, 30% mRCA --> PCI placing a 3.0x84mm Cypher DES. e.) LHC 02/19/2008: 40 % mLAD, 25% pLCx, 30% pRCA-1, 40% pRCA-2, 40% mRCA, 10% dRCA; 10% ISR pLAD and LCx; med mgmt   Depression    DJD (degenerative joint disease)    DVT of lower extremity, bilateral (HCC)    a.) IVC filter placed 09/2007; removed 2011.   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 b.) rate/rhythm maintained without pharmacological intervention; not currently on chronic anticoagulation therapy   Paralysis of RIGHT diaphragm    Peripheral vascular disease (HCC)    Rhabdomyolysis 2006   Senile purpura (HCC)    Sepsis due to pneumonia (HCC) 10/28/2021   Stasis dermatitis of both legs    T2DM (type 2 diabetes mellitus) (HCC)    Therapeutic opioid-induced constipation (OIC)    a.) takes daily Senokot-S + bisacodyl    Past Surgical History:  Procedure Laterality Date   COLONOSCOPY N/A 11/22/2013   COLONOSCOPY N/A 09/19/2008   COLONOSCOPY N/A 08/25/2003   COLONOSCOPY N/A 10/15/1994   COLONOSCOPY WITH PROPOFOL N/A 02/27/2018   Procedure: COLONOSCOPY  WITH PROPOFOL;  Surgeon: Scot Jun, MD;  Location: [facility] ENDOSCOPY;  Service: Endoscopy;  Laterality: N/A;   CORONARY ANGIOPLASTY     2000   CORONARY ANGIOPLASTY WITH STENT PLACEMENT Left 12/04/2004   Procedure: CORONARY ANGIOPLASTY WITH STENT PLACEMENT; Location: [facility]; Surgeon: Arnoldo Hooker, MD   EXTERNAL EAR SURGERY     x3   INTRATHECAL PUMP IMPLANTATION N/A 03/2008   INTRATHECAL PUMP REVISION N/A 09/28/2014   IVC FILTER PLACEMENT ([facility] HX) N/A 09/2007   IVC FILTER REMOVAL N/A 2011   LEFT HEART CATH AND CORONARY ANGIOGRAPHY Left 02/19/2008   Procedure: LEFT HEART CATH AND CORONARY ANGIOGRAPHY; Location: [facility]; Surgeon: Arnoldo Hooker, MD   LEFT HEART CATH AND CORONARY ANGIOGRAPHY Left 04/18/1987   Procedure: LEFT HEART CATH AND CORONARY ANGIOGRAPHY; Location: [facility]; Surgeon: Cephas Darby, MD   LEFT HEART CATH AND CORONARY ANGIOGRAPHY Left 02/03/2002   Procedure: LEFT HEART CATH AND CORONARY ANGIOGRAPHY; Location: [facility]; Surgeon: Rudean Hitt, MD   MASTOIDECTOMY     x 3   NISSEN FUNDOPLICATION N/A 2000   PAIN PUMP REMOVAL N/A 12/20/2021   Procedure: PAIN PUMP REMOVAL;  Surgeon: Delano Metz, MD;  Location: [facility] ORS;  Service: Neurosurgery;  Laterality: N/A;   PARTIAL COLECTOMY N/A    Procedure: PARTIAL BOWEL RESECTION WITH LYSIS OF ADHESIONS   POSTERIOR LAMINECTOMY / DECOMPRESSION CERVICAL SPINE N/A 1984   POSTERIOR LAMINECTOMY / DECOMPRESSION CERVICAL SPINE N/A 1985   POSTERIOR LUMBAR FUSION N/A 1999   SKIN CANCER EXCISION     TONSILLECTOMY Bilateral 1958   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   Procedure: TRANSURETHRAL RESECTION OF BLADDER TUMOR (TURBT);  Surgeon: Gabriel Ape, MD;  Location: [facility] ORS;  Service: Urology;  Laterality: N/A;     Family History  Problem Relation Age of Onset   Heart disease Mother    Emphysema Father    Heart attack Father    Emphysema Sister    Heart disease Brother       Social History   Tobacco Use    Smoking status: Every Day    Current packs/day: 0.75    Average packs/day: 0.8 packs/day for 59.0 years (44.3 ttl pk-yrs)    Types: Cigarettes   Smokeless tobacco: Never  Vaping Use   Vaping status: Never Used  Substance Use Topics   Alcohol use: No   Drug use: No     Allergies  Allergen Reactions   Celebrex [Celecoxib] Nausea Only   Codeine Nausea Only   Penicillins Other (See Comments)    Unknown-last had as a child Other reaction(s): Other (See Comments), Unknown unknown   Entresto [Sacubitril-Valsartan] Itching and Swelling  Current Outpatient Medications  Medication Sig Dispense Refill   albuterol (VENTOLIN HFA) 108 (90 Base) MCG/ACT inhaler Inhale into the lungs every 6 (six) hours as needed for wheezing or shortness of breath.     amitriptyline (ELAVIL) 100 MG tablet Take 100 mg by mouth at bedtime.     aspirin EC 81 MG EC tablet Take 1 tablet (81 mg total) by mouth daily. Swallow whole. 30 tablet 11   azelastine (ASTELIN) 0.1 % nasal spray Place 1 spray into both nostrils as needed.     bisacodyl (DULCOLAX) 5 MG EC tablet Take 5 mg by mouth daily as needed for moderate constipation.     cloNIDine (CATAPRES) 0.1 MG tablet Take 0.1 mg by mouth 2 (two) times daily.     DULoxetine (CYMBALTA) 60 MG capsule 60 mg every morning.     ELIQUIS 5 MG TABS tablet Take 1 tablet (5 mg total) by mouth 2 (two) times daily. 60 tablet 5   HYDROmorphone (DILAUDID) 4 MG tablet Take 1 tablet (4 mg total) by mouth every 8 (eight) hours as needed. Must last 30 days. 90 tablet 0   [START ON 03/14/2023] HYDROmorphone (DILAUDID) 4 MG tablet Take 1 tablet (4 mg total) by mouth every 8 (eight) hours as needed. Must last 30 days. 90 tablet 0   [START ON 04/13/2023] HYDROmorphone (DILAUDID) 4 MG tablet Take 1 tablet (4 mg total) by mouth every 8 (eight) hours as needed. Must last 30 days. 90 tablet 0   levothyroxine (SYNTHROID) 125 MCG tablet Take 125 mcg by mouth daily before breakfast.      loratadine (CLARITIN) 10 MG tablet Take 10 mg by mouth at bedtime.     losartan (COZAAR) 50 MG tablet Take 1 tablet (50 mg total) by mouth daily. 90 tablet 3   naloxone (NARCAN) nasal spray 4 mg/0.1 mL Place 1 spray into the nose as needed for up to 365 doses (for opioid-induced respiratory depresssion). In case of emergency (overdose), spray once into each nostril. If no response within 3 minutes, repeat application and call 911. 1 each 0   simvastatin (ZOCOR) 40 MG tablet Take 40 mg by mouth every morning.     pregabalin (LYRICA) 75 MG capsule Take 1 capsule (75 mg total) by mouth 3 (three) times daily. 90 capsule 5   No current facility-administered medications for this visit.      REVIEW OF SYSTEMS (Negative unless checked)  Constitutional: [] Weight loss  [] Fever  [] Chills Cardiac: [] Chest pain   [] Chest pressure   [] Palpitations   [] Shortness of breath when laying flat   [] Shortness of breath at rest   [] Shortness of breath with exertion. Vascular:  [x] Pain in legs with walking   [x] Pain in legs at rest   [x] Pain in legs when laying flat   [] Claudication   [] Pain in feet when walking  [] Pain in feet at rest  [] Pain in feet when laying flat   [] History of DVT   [] Phlebitis   [x] Swelling in legs   [x] Varicose veins   [] Non-healing ulcers Pulmonary:   [] Uses home oxygen   [] Productive cough   [] Hemoptysis   [] Wheeze  [x] COPD   [] Asthma Neurologic:  [] Dizziness  [] Blackouts   [] Seizures   [] History of stroke   [] History of TIA  [] Aphasia   [] Temporary blindness   [] Dysphagia   [] Weakness or numbness in arms   [] Weakness or numbness in legs Musculoskeletal:  [x] Arthritis   [] Joint swelling   [x] Joint pain   [x] Low back  pain Hematologic:  [] Easy bruising  [] Easy bleeding   [] Hypercoagulable state   [] Anemic  [] Hepatitis Gastrointestinal:  [] Blood in stool   [] Vomiting blood  [x] Gastroesophageal reflux/heartburn   [] Abdominal pain Genitourinary:  [] Chronic kidney disease   [] Difficult urination   [] Frequent urination  [] Burning with urination   [] Hematuria Skin:  [] Rashes   [] Ulcers   [] Wounds Psychological:  [x] History of anxiety   []  History of major depression.    Physical Exam BP 116/78 (BP Location: Left Arm)   Pulse 74   Resp 18   Ht 6\' 3"  (1.905 m)   Wt 200 lb 6.4 oz (90.9 kg)   BMI 25.05 kg/m  Gen:  WD/WN, NAD Head: [place]/AT, No temporalis wasting. Ear/Nose/Throat: Hearing grossly intact, nares w/o erythema or drainage, oropharynx w/o Erythema/Exudate Eyes: Conjunctiva clear, sclera non-icteric  Neck: trachea midline.  No JVD.  Pulmonary:  Good air movement, respirations not labored, no use of accessory muscles  Cardiac: RRR, no JVD Vascular:  Vessel Right Left  Radial Palpable Palpable                          PT NP NP  DP 1+ 1+   Gastrointestinal:. No masses, surgical incisions, or scars. Musculoskeletal: M/S 5/5 throughout.  Extremities without ischemic changes.  No deformity or atrophy.  Marked stasis dermatitis changes are present in both lower extremities up to the upper calf area.  Fairly prominent varicosities were seen a little more so on the right than the left.  Mild bilateral lower extremity edema. Neurologic: Sensation grossly intact in extremities.  Symmetrical.  Speech is fluent. Motor exam as listed above. Psychiatric: Judgment intact, Mood & affect appropriate for pt's clinical situation. Dermatologic: Severe stasis dermatitis changes are present in both lower extremities.    Radiology DG Ribs Unilateral W/Chest Right  Result Date: 02/24/2023 CLINICAL DATA:  Pain after fall EXAM: RIGHT RIBS AND CHEST - 4 VIEW COMPARISON:  Chest x-ray 10/28/2021 FINDINGS: Elevation of the right hemidiaphragm. Chronic interstitial changes of the lungs. No consolidation, pneumothorax or effusion. Normal cardiopericardial silhouette with a calcified aorta. No edema. Surgical clips in the left axillary region. No right-sided rib fracture identified. IMPRESSION:  Elevated right hemidiaphragm. Chronic interstitial changes of the lungs. No pneumothorax or effusion. No right-sided rib fracture identified. Electronically Signed   By: Karen Kays M.D.   On: 02/24/2023 15:28    Labs No results found for this or any previous visit (from the past 2160 hour(s)).  Assessment/Plan: Essential hypertension, benign blood pressure control important in reducing the progression of atherosclerotic disease. On appropriate oral medications.     Diabetes (HCC) blood glucose control important in reducing the progression of atherosclerotic disease. Also, involved in wound healing. On appropriate medications.  Chronic venous insufficiency A venous reflux study today showed no evidence of deep venous thrombosis or superficial thrombophlebitis.  No significant venous reflux were seen in the large veins but there were prominent varicosities present.  Spotcheck of the arterial flow in both lower extremities also showed normal triphasic and biphasic flow in the tibial vessels.   Given these findings, there may be some role for injections of sclerotherapy or foam sclerotherapy to help treat the painful superficial varicosities of the lower extremity, but I do think this role is fairly limited.  I do not think a dramatic improvement would be seen.  No arterial insufficiency was present.  I will just plan to see him back in about 6 months  at this time to follow-up and monitor his legs.      Festus Barren 03/04/2023, 3:48 PM   This note was created with Dragon medical transcription system.  Any errors from dictation are unintentional.

## 2023-03-04 NOTE — Assessment & Plan Note (Signed)
A venous reflux study today showed no evidence of deep venous thrombosis or superficial thrombophlebitis.  No significant venous reflux were seen in the large veins but there were prominent varicosities present.  Spotcheck of the arterial flow in both lower extremities also showed normal triphasic and biphasic flow in the tibial vessels.   Given these findings, there may be some role for injections of sclerotherapy or foam sclerotherapy to help treat the painful superficial varicosities of the lower extremity, but I do think this role is fairly limited.  I do not think a dramatic improvement would be seen.  No arterial insufficiency was present.  I will just plan to see him back in about 6 months at this time to follow-up and monitor his legs.

## 2023-03-10 DIAGNOSIS — M545 Low back pain, unspecified: Secondary | ICD-10-CM | POA: Insufficient documentation

## 2023-03-10 DIAGNOSIS — M47816 Spondylosis without myelopathy or radiculopathy, lumbar region: Secondary | ICD-10-CM | POA: Insufficient documentation

## 2023-03-10 NOTE — Progress Notes (Unsigned)
Patient did not stop his blood thinner as instructed. Case rescheduled.

## 2023-03-10 NOTE — Patient Instructions (Signed)

## 2023-03-11 ENCOUNTER — Ambulatory Visit: Payer: Medicare (Managed Care) | Attending: Pain Medicine | Admitting: Pain Medicine

## 2023-03-11 DIAGNOSIS — Z538 Procedure and treatment not carried out for other reasons: Secondary | ICD-10-CM

## 2023-03-11 DIAGNOSIS — M4316 Spondylolisthesis, lumbar region: Secondary | ICD-10-CM

## 2023-03-11 DIAGNOSIS — M47817 Spondylosis without myelopathy or radiculopathy, lumbosacral region: Secondary | ICD-10-CM

## 2023-03-11 DIAGNOSIS — R937 Abnormal findings on diagnostic imaging of other parts of musculoskeletal system: Secondary | ICD-10-CM

## 2023-03-11 DIAGNOSIS — M5459 Other low back pain: Secondary | ICD-10-CM

## 2023-03-11 DIAGNOSIS — M47816 Spondylosis without myelopathy or radiculopathy, lumbar region: Secondary | ICD-10-CM

## 2023-03-11 DIAGNOSIS — M545 Low back pain, unspecified: Secondary | ICD-10-CM

## 2023-03-11 DIAGNOSIS — Z7901 Long term (current) use of anticoagulants: Secondary | ICD-10-CM

## 2023-03-11 DIAGNOSIS — M5137 Other intervertebral disc degeneration, lumbosacral region: Secondary | ICD-10-CM

## 2023-03-11 DIAGNOSIS — M961 Postlaminectomy syndrome, not elsewhere classified: Secondary | ICD-10-CM

## 2023-03-11 MED ORDER — FENTANYL CITRATE (PF) 100 MCG/2ML IJ SOLN: 25.0000 ug | INTRAMUSCULAR | Status: DC | PRN

## 2023-03-11 MED ORDER — LIDOCAINE HCL 2 % IJ SOLN: 20.0000 mL | Freq: Once | INTRAMUSCULAR | Status: DC

## 2023-03-11 MED ORDER — MIDAZOLAM HCL 5 MG/5ML IJ SOLN: 0.5000 mg | Freq: Once | INTRAMUSCULAR | Status: DC

## 2023-03-11 MED ORDER — TRIAMCINOLONE ACETONIDE 40 MG/ML IJ SUSP: 80.0000 mg | Freq: Once | INTRAMUSCULAR | Status: DC

## 2023-03-11 MED ORDER — PENTAFLUOROPROP-TETRAFLUOROETH EX AERO
INHALATION_SPRAY | Freq: Once | CUTANEOUS | Status: DC
  Filled 2023-03-11: qty 116

## 2023-03-11 MED ORDER — LACTATED RINGERS IV SOLN: Freq: Once | INTRAVENOUS | Status: DC

## 2023-03-11 MED ORDER — ROPIVACAINE HCL 2 MG/ML IJ SOLN: 18.0000 mL | Freq: Once | INTRAMUSCULAR | Status: DC

## 2023-03-12 ENCOUNTER — Ambulatory Visit
Admission: RE | Admit: 2023-03-12 | Discharge: 2023-03-12 | Disposition: A | Discharge status: Home / Self Care | Payer: Medicare (Managed Care) | Source: Ambulatory Visit | Attending: Internal Medicine | Admitting: Internal Medicine

## 2023-03-12 DIAGNOSIS — R1084 Generalized abdominal pain: Secondary | ICD-10-CM | POA: Insufficient documentation

## 2023-03-12 LAB — POCT I-STAT CREATININE: Creatinine, Ser: 1 mg/dL (ref 0.61–1.24)

## 2023-03-12 MED ORDER — IOHEXOL 300 MG/ML  SOLN
100.0000 mL | Freq: Once | INTRAMUSCULAR | Status: AC | PRN
  Administered 2023-03-12: 100 mL via INTRAVENOUS

## 2023-03-20 DIAGNOSIS — I7143 Infrarenal abdominal aortic aneurysm, without rupture: Secondary | ICD-10-CM | POA: Insufficient documentation

## 2023-03-27 ENCOUNTER — Ambulatory Visit: Payer: Medicare (Managed Care) | Attending: Pain Medicine | Admitting: Pain Medicine

## 2023-03-27 ENCOUNTER — Encounter: Payer: Self-pay | Admitting: Pain Medicine

## 2023-03-27 ENCOUNTER — Ambulatory Visit
Admission: RE | Admit: 2023-03-27 | Discharge: 2023-03-27 | Disposition: A | Discharge status: Home / Self Care | Payer: Medicare (Managed Care) | Source: Ambulatory Visit | Attending: Pain Medicine | Admitting: Pain Medicine

## 2023-03-27 VITALS — BP 142/67 | HR 77 | Temp 98.0°F | Resp 16 | Ht 75.0 in | Wt 200.0 lb

## 2023-03-27 DIAGNOSIS — M5137 Other intervertebral disc degeneration, lumbosacral region: Secondary | ICD-10-CM | POA: Diagnosis present

## 2023-03-27 DIAGNOSIS — M5459 Other low back pain: Secondary | ICD-10-CM

## 2023-03-27 DIAGNOSIS — Z7901 Long term (current) use of anticoagulants: Secondary | ICD-10-CM | POA: Diagnosis present

## 2023-03-27 DIAGNOSIS — M47816 Spondylosis without myelopathy or radiculopathy, lumbar region: Secondary | ICD-10-CM

## 2023-03-27 DIAGNOSIS — M4316 Spondylolisthesis, lumbar region: Secondary | ICD-10-CM

## 2023-03-27 DIAGNOSIS — M542 Cervicalgia: Secondary | ICD-10-CM

## 2023-03-27 DIAGNOSIS — M961 Postlaminectomy syndrome, not elsewhere classified: Secondary | ICD-10-CM

## 2023-03-27 DIAGNOSIS — M5030 Other cervical disc degeneration, unspecified cervical region: Secondary | ICD-10-CM

## 2023-03-27 DIAGNOSIS — M545 Low back pain, unspecified: Secondary | ICD-10-CM

## 2023-03-27 DIAGNOSIS — M47817 Spondylosis without myelopathy or radiculopathy, lumbosacral region: Secondary | ICD-10-CM | POA: Diagnosis present

## 2023-03-27 DIAGNOSIS — M47812 Spondylosis without myelopathy or radiculopathy, cervical region: Secondary | ICD-10-CM | POA: Diagnosis present

## 2023-03-27 DIAGNOSIS — G8929 Other chronic pain: Secondary | ICD-10-CM | POA: Diagnosis present

## 2023-03-27 DIAGNOSIS — M51379 Other intervertebral disc degeneration, lumbosacral region without mention of lumbar back pain or lower extremity pain: Secondary | ICD-10-CM

## 2023-03-27 DIAGNOSIS — R937 Abnormal findings on diagnostic imaging of other parts of musculoskeletal system: Secondary | ICD-10-CM | POA: Diagnosis present

## 2023-03-27 DIAGNOSIS — M4800 Spinal stenosis, site unspecified: Secondary | ICD-10-CM

## 2023-03-27 MED ORDER — FENTANYL CITRATE (PF) 100 MCG/2ML IJ SOLN
25.0000 ug | INTRAMUSCULAR | Status: DC | PRN
  Administered 2023-03-27: 50 ug via INTRAVENOUS

## 2023-03-27 MED ORDER — LACTATED RINGERS IV SOLN: Freq: Once | INTRAVENOUS | Status: AC

## 2023-03-27 MED ORDER — TRIAMCINOLONE ACETONIDE 40 MG/ML IJ SUSP
80.0000 mg | Freq: Once | INTRAMUSCULAR | Status: AC
  Administered 2023-03-27: 80 mg

## 2023-03-27 MED ORDER — MIDAZOLAM HCL 5 MG/5ML IJ SOLN
INTRAMUSCULAR | Status: AC
  Filled 2023-03-27: qty 5

## 2023-03-27 MED ORDER — LIDOCAINE HCL 2 % IJ SOLN
INTRAMUSCULAR | Status: AC
  Filled 2023-03-27: qty 20

## 2023-03-27 MED ORDER — PENTAFLUOROPROP-TETRAFLUOROETH EX AERO
INHALATION_SPRAY | Freq: Once | CUTANEOUS | Status: AC
  Administered 2023-03-27: 30 via TOPICAL
  Filled 2023-03-27: qty 30

## 2023-03-27 MED ORDER — FENTANYL CITRATE (PF) 100 MCG/2ML IJ SOLN
INTRAMUSCULAR | Status: AC
  Filled 2023-03-27: qty 2

## 2023-03-27 MED ORDER — MIDAZOLAM HCL 5 MG/5ML IJ SOLN
0.5000 mg | Freq: Once | INTRAMUSCULAR | Status: AC
  Administered 2023-03-27: 3 mg via INTRAVENOUS

## 2023-03-27 MED ORDER — ROPIVACAINE HCL 2 MG/ML IJ SOLN
INTRAMUSCULAR | Status: AC
  Filled 2023-03-27: qty 20

## 2023-03-27 MED ORDER — ROPIVACAINE HCL 2 MG/ML IJ SOLN
18.0000 mL | Freq: Once | INTRAMUSCULAR | Status: AC
  Administered 2023-03-27: 18 mL via PERINEURAL

## 2023-03-27 MED ORDER — LIDOCAINE HCL 2 % IJ SOLN
20.0000 mL | Freq: Once | INTRAMUSCULAR | Status: AC
  Administered 2023-03-27: 400 mg

## 2023-03-27 MED ORDER — TRIAMCINOLONE ACETONIDE 40 MG/ML IJ SUSP
INTRAMUSCULAR | Status: AC
  Filled 2023-03-27: qty 2

## 2023-03-27 NOTE — Progress Notes (Signed)
Safety precautions to be maintained throughout the outpatient stay will include: orient to surroundings, keep bed in low position, maintain call bell within reach at all times, provide assistance with transfer out of bed and ambulation.  

## 2023-03-27 NOTE — Patient Instructions (Signed)
______________________________________________________________________    Post-Procedure Discharge Instructions  Instructions: Apply ice:  Purpose: This will minimize any swelling and discomfort after procedure.  When: Day of procedure, as soon as you get home. How: Fill a plastic sandwich bag with crushed ice. Cover it with a small towel and apply to injection site. How long: (15 min on, 15 min off) Apply for 15 minutes then remove x 15 minutes.  Repeat sequence on day of procedure, until you go to bed. Apply heat:  Purpose: To treat any soreness and discomfort from the procedure. When: Starting the next day after the procedure. How: Apply heat to procedure site starting the day following the procedure. How long: May continue to repeat daily, until discomfort goes away. Food intake: Start with clear liquids (like water) and advance to regular food, as tolerated.  Physical activities: Keep activities to a minimum for the first 8 hours after the procedure. After that, then as tolerated. Driving: If you have received any sedation, be responsible and do not drive. You are not allowed to drive for 24 hours after having sedation. Blood thinner: (Applies only to those taking blood thinners) You may restart your blood thinner 6 hours after your procedure. Insulin: (Applies only to Diabetic patients taking insulin) As soon as you can eat, you may resume your normal dosing schedule. Infection prevention: Keep procedure site clean and dry. Shower daily and clean area with soap and water. Post-procedure Pain Diary: Extremely important that this be done correctly and accurately. Recorded information will be used to determine the next step in treatment. For the purpose of accuracy, follow these rules: Evaluate only the area treated. Do not report or include pain from an untreated area. For the purpose of this evaluation, ignore all other areas of pain, except for the treated area. After your procedure,  avoid taking a long nap and attempting to complete the pain diary after you wake up. Instead, set your alarm clock to go off every hour, on the hour, for the initial 8 hours after the procedure. Document the duration of the numbing medicine, and the relief you are getting from it. Do not go to sleep and attempt to complete it later. It will not be accurate. If you received sedation, it is likely that you were given a medication that may cause amnesia. Because of this, completing the diary at a later time may cause the information to be inaccurate. This information is needed to plan your care. Follow-up appointment: Keep your post-procedure follow-up evaluation appointment after the procedure (usually 2 weeks for most procedures, 6 weeks for radiofrequencies). DO NOT FORGET to bring you pain diary with you.   Expect: (What should I expect to see with my procedure?) From numbing medicine (AKA: Local Anesthetics): Numbness or decrease in pain. You may also experience some weakness, which if present, could last for the duration of the local anesthetic. Onset: Full effect within 15 minutes of injected. Duration: It will depend on the type of local anesthetic used. On the average, 1 to 8 hours.  From steroids (Applies only if steroids were used): Decrease in swelling or inflammation. Once inflammation is improved, relief of the pain will follow. Onset of benefits: Depends on the amount of swelling present. The more swelling, the longer it will take for the benefits to be seen. In some cases, up to 10 days. Duration: Steroids will stay in the system x 2 weeks. Duration of benefits will depend on multiple posibilities including persistent irritating factors. Side-effects: If  present, they may typically last 2 weeks (the duration of the steroids). Frequent: Cramps (if they occur, drink Gatorade and take over-the-counter Magnesium 450-500 mg once to twice a day); water retention with temporary weight gain;  increases in blood sugar; decreased immune system response; increased appetite. Occasional: Facial flushing (red, warm cheeks); mood swings; menstrual changes. Uncommon: Long-term decrease or suppression of natural hormones; bone thinning. (These are more common with higher doses or more frequent use. This is why we prefer that our patients avoid having any injection therapies in other practices.)  Very Rare: Severe mood changes; psychosis; aseptic necrosis. From procedure: Some discomfort is to be expected once the numbing medicine wears off. This should be minimal if ice and heat are applied as instructed.  Call if: (When should I call?) You experience numbness and weakness that gets worse with time, as opposed to wearing off. New onset bowel or bladder incontinence. (Applies only to procedures done in the spine)  Emergency Numbers: Durning business hours (Monday - Thursday, 8:00 AM - 4:00 PM) (Friday, 9:00 AM - 12:00 Noon): [phone] After hours: [phone] NOTE: If you are having a problem and are unable connect with, or to talk to a provider, then go to your nearest urgent care or emergency department. If the problem is serious and urgent, please call 911. ______________________________________________________________________      ______________________________________________________________________    Blood Thinners  IMPORTANT NOTICE:  If you take any of these, make sure to notify the nursing staff.  Failure to do so may result in serious injury.  Recommended time intervals to stop and restart blood-thinners, before & after invasive procedures  Generic Name Brand Name Pre-procedure: Stop medication for this amount of time before your procedure: Post-procedure: Wait this amount of time after the procedure before restarting your medication:  Abciximab Reopro 15 days 2 hrs  Alteplase Activase 10 days 10 days  Anagrelide Agrylin    Apixaban Eliquis 3 days 6 hrs   Cilostazol Pletal 3 days 5 hrs  Clopidogrel Plavix 7-10 days 2 hrs  Dabigatran Pradaxa 5 days 6 hrs  Dalteparin Fragmin 24 hours 4 hrs  Dipyridamole Aggrenox 11days 2 hrs  Edoxaban Lixiana; Savaysa 3 days 2 hrs  Enoxaparin  Lovenox 24 hours 4 hrs  Eptifibatide Integrillin 8 hours 2 hrs  Fondaparinux  Arixtra 72 hours 12 hrs  Hydroxychloroquine Plaquenil 11 days   Prasugrel Effient 7-10 days 6 hrs  Reteplase Retavase 10 days 10 days  Rivaroxaban Xarelto 3 days 6 hrs  Ticagrelor Brilinta 5-7 days 6 hrs  Ticlopidine Ticlid 10-14 days 2 hrs  Tinzaparin Innohep 24 hours 4 hrs  Tirofiban Aggrastat 8 hours 2 hrs  Warfarin Coumadin 5 days 2 hrs   Other medications with blood-thinning effects  Product indications Generic (Brand) names Note  Cholesterol Lipitor Stop 4 days before procedure  Blood thinner (injectable) Heparin (LMW or LMWH Heparin) Stop 24 hours before procedure  Cancer Ibrutinib (Imbruvica) Stop 7 days before procedure  Malaria/Rheumatoid Hydroxychloroquine (Plaquenil) Stop 11 days before procedure  Thrombolytics  10 days before or after procedures   Over-the-counter (OTC) Products with blood-thinning effects  Product Common names Stop Time  Aspirin > 325 mg Goody Powders, Excedrin, etc. 11 days  Aspirin ? 81 mg  7 days  Fish oil  4 days  Garlic supplements  7 days  Ginkgo biloba  36 hours  Ginseng  24 hours  NSAIDs Ibuprofen, Naprosyn, etc. 3 days  Vitamin E  4 days   ______________________________________________________________________

## 2023-03-27 NOTE — Addendum Note (Signed)
Addended by: Delano Metz A on: 03/27/2023 06:01 PM   Modules accepted: Orders

## 2023-03-27 NOTE — Progress Notes (Addendum)
PROVIDER NOTE: Interpretation of information contained herein should be left to medically-trained personnel. Specific patient instructions are provided elsewhere under "Patient Instructions" section of medical record. This document was created in part using STT-dictation technology, any transcriptional errors that may result from this process are unintentional.  Patient: [name] Type: Established DOB: 1946/01/22 MRN: [id number] PCP: Lynnea Ferrier, MD  Service: Procedure DOS: 03/27/2023 Setting: Ambulatory Location: Ambulatory outpatient facility Delivery: Face-to-face Provider: Oswaldo Done, MD Specialty: Interventional Pain Management Specialty designation: 09 Location: Outpatient facility Ref. Prov.: Lynnea Ferrier, MD       Interventional Therapy   Procedure: Lumbar Facet, Medial Branch Block(s) #1  Laterality: Bilateral  Level: L2, L3, L4, L5, and S1 Medial Branch Level(s). Injecting these levels blocks the L3-4, L4-5, and L5-S1 lumbar facet joints. Imaging: Fluoroscopic guidance Spinal ([id number]) Anesthesia: Local anesthesia (1-2% Lidocaine) Anxiolysis: IV Versed         Sedation: Moderate Sedation                       DOS: 03/27/2023 Performed by: Oswaldo Done, MD  Primary Purpose: Diagnostic/Therapeutic Indications: Low back pain severe enough to impact quality of life or function. 1. 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 Lumbar facet joint syndrome   3. Spondylosis without myelopathy or radiculopathy, lumbosacral region   4. Lumbar facet joint pain   5. 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 Low back pain of over 3 months duration   7. 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 DDD (degenerative disc disease), lumbosacral   9. Chronic anticoagulation (Eliquis)    NAS-11 Pain score:   Pre-procedure: 10-Worst pain ever/10   Post-procedure: 0/10   NOTE: The patient has requested to have an evaluation of his  cervical spine upon his return to the clinic as he is having some new problems associated with that area.  A recent MRI of the cervical spine done on 10/11/2022 indicated the following:  (10/11/2022) CERVICAL MRI IMPRESSION: 1. Redemonstrated severe multilevel degenerative changes most notably at C6-7 where there is moderate to severe spinal canal stenosis, unchanged compared to prior exam. Moderate spinal canal stenosis is also present at C3-4, C4-5, and C7-T1. The degree of spinal canal stenosis is slightly progressed at C4-5. 2. Combination of severe facet degenerative change and uncovertebral hypertrophy results in severe neural foraminal stenosis at C3-C7. The degree of neural foraminal stenosis has slightly progressed at C3-4 compared to 2023.  He is interested in getting a referral to neurosurgery for possible surgical alternatives.    Position / Prep / Materials:  Position: Prone  Prep solution: DuraPrep (Iodine Povacrylex [0.7% available iodine] and Isopropyl Alcohol, 74% w/w) Area Prepped: Posterolateral Lumbosacral Spine (Wide prep: From the lower border of the scapula down to the end of the tailbone and from flank to flank.)  Materials:  Tray: Block Needle(s):  Type: Spinal  Gauge (G): 22  Length: 3.5-in Qty: 4      H&P (Pre-op Assessment):  [name] is a 77 y.o. (year old), male patient, seen today for interventional treatment. He  has a past surgical history that includes Coronary angioplasty; Posterior lumbar fusion (N/A, 1999); Nissen fundoplication (N/A, 2000); Mastoidectomy; Partial colectomy (N/A); Intrathecal pump implantation (N/A, 03/2008); Intrathecal pump revision (N/A, 09/28/2014); Transurethral resection of bladder tumor (N/A, 03/07/2015); Colonoscopy with propofol (N/A, 02/27/2018); External ear surgery; Skin cancer excision; IVC FILTER PLACEMENT ([facility] HX) (N/A, 09/2007);  IVC FILTER REMOVAL (N/A, 2011); Colonoscopy (N/A, 11/22/2013); Colonoscopy (N/A, 09/19/2008);  Colonoscopy (N/A, 08/25/2003); Colonoscopy (N/A, 10/15/1994); Posterior laminectomy / decompression cervical spine (N/A, 1984); Posterior laminectomy / decompression cervical spine (N/A, 1985); Tonsillectomy (Bilateral, 1958); Coronary angioplasty with stent (Left, 12/04/2004); LEFT HEART CATH AND CORONARY ANGIOGRAPHY (Left, 02/19/2008); LEFT HEART CATH AND CORONARY ANGIOGRAPHY (Left, 04/18/1987); LEFT HEART CATH AND CORONARY ANGIOGRAPHY (Left, 02/03/2002); and Pain pump removal (N/A, 12/20/2021). [name] has a current medication list which includes the following prescription(s): albuterol, amitriptyline, aspirin ec, azelastine, bisacodyl, clonidine, duloxetine, eliquis, hydromorphone, [START ON 04/13/2023] hydromorphone, levothyroxine, loratadine, losartan, naloxone, simvastatin, hydromorphone, and pregabalin, and the following Facility-Administered Medications: fentanyl. His primarily concern today is the Back Pain (lower)  Initial Vital Signs:  Pulse/HCG Rate: 80ECG Heart Rate: 77 Temp: (!) 97.4 F (36.3 C) Resp: 16 BP: (!) 173/78 SpO2: 95 %  BMI: Estimated body mass index is 25 kg/m as calculated from the following:   Height as of this encounter: 6\' 3"  (1.905 m).   Weight as of this encounter: 200 lb (90.7 kg).  Risk Assessment: Allergies: Reviewed. He is allergic to celebrex [celecoxib], codeine, penicillins, and entresto [sacubitril-valsartan].  Allergy Precautions: None required Coagulopathies: Reviewed. None identified.  Blood-thinner therapy: None at this time Active Infection(s): Reviewed. None identified. [name] is afebrile  Site Confirmation: [name] was asked to confirm the procedure and laterality before marking the site Procedure checklist: Completed Consent: Before the procedure and under the influence of no sedative(s), amnesic(s), or anxiolytics, the patient was informed of the treatment options, risks and possible complications. To fulfill our ethical and legal  obligations, as recommended by the American Medical Association's Code of Ethics, I have informed the patient of my clinical impression; the nature and purpose of the treatment or procedure; the risks, benefits, and possible complications of the intervention; the alternatives, including doing nothing; the risk(s) and benefit(s) of the alternative treatment(s) or procedure(s); and the risk(s) and benefit(s) of doing nothing. The patient was provided information about the general risks and possible complications associated with the procedure. These may include, but are not limited to: failure to achieve desired goals, infection, bleeding, organ or nerve damage, allergic reactions, paralysis, and death. In addition, the patient was informed of those risks and complications associated to Spine-related procedures, such as failure to decrease pain; infection (i.e.: Meningitis, epidural or intraspinal abscess); bleeding (i.e.: epidural hematoma, subarachnoid hemorrhage, or any other type of intraspinal or peri-dural bleeding); organ or nerve damage (i.e.: Any type of peripheral nerve, nerve root, or spinal cord injury) with subsequent damage to sensory, motor, and/or autonomic systems, resulting in permanent pain, numbness, and/or weakness of one or several areas of the body; allergic reactions; (i.e.: anaphylactic reaction); and/or death. Furthermore, the patient was informed of those risks and complications associated with the medications. These include, but are not limited to: allergic reactions (i.e.: anaphylactic or anaphylactoid reaction(s)); adrenal axis suppression; blood sugar elevation that in diabetics may result in ketoacidosis or comma; water retention that in patients with history of congestive heart failure may result in shortness of breath, pulmonary edema, and decompensation with resultant heart failure; weight gain; swelling or edema; medication-induced neural toxicity; particulate matter embolism and  blood vessel occlusion with resultant organ, and/or nervous system infarction; and/or aseptic necrosis of one or more joints. Finally, the patient was informed that Medicine is not an exact science; therefore, there is also the possibility of unforeseen or unpredictable risks and/or possible complications that may result in a catastrophic outcome.  The patient indicated having understood very clearly. We have given the patient no guarantees and we have made no promises. Enough time was given to the patient to ask questions, all of which were answered to the patient's satisfaction. Mr. Rucci has indicated that he wanted to continue with the procedure. Attestation: I, the ordering provider, attest that I have discussed with the patient the benefits, risks, side-effects, alternatives, likelihood of achieving goals, and potential problems during recovery for the procedure that I have provided informed consent. Date  Time: 03/27/2023  9:44 AM   Pre-Procedure Preparation:  Monitoring: As per clinic protocol. Respiration, ETCO2, SpO2, BP, heart rate and rhythm monitor placed and checked for adequate function Safety Precautions: Patient was assessed for positional comfort and pressure points before starting the procedure. Time-out: I initiated and conducted the "Time-out" before starting the procedure, as per protocol. The patient was asked to participate by confirming the accuracy of the "Time Out" information. Verification of the correct person, site, and procedure were performed and confirmed by me, the nursing staff, and the patient. "Time-out" conducted as per Joint Commission's Universal Protocol (UP.01.01.01). Time: 1135 Start Time: 1136 hrs.  Description of Procedure:          Laterality: (see above) Targeted Levels: (see above)  Safety Precautions: Aspiration looking for blood return was conducted prior to all injections. At no point did we inject any substances, as a needle was being advanced. Before  injecting, the patient was told to immediately notify me if he was experiencing any new onset of "ringing in the ears, or metallic taste in the mouth". No attempts were made at seeking any paresthesias. Safe injection practices and needle disposal techniques used. Medications properly checked for expiration dates. SDV (single dose vial) medications used. After the completion of the procedure, all disposable equipment used was discarded in the proper designated medical waste containers. Local Anesthesia: Protocol guidelines were followed. The patient was positioned over the fluoroscopy table. The area was prepped in the usual manner. The time-out was completed. The target area was identified using fluoroscopy. A 12-in long, straight, sterile hemostat was used with fluoroscopic guidance to locate the targets for each level blocked. Once located, the skin was marked with an approved surgical skin marker. Once all sites were marked, the skin (epidermis, dermis, and hypodermis), as well as deeper tissues (fat, connective tissue and muscle) were infiltrated with a small amount of a short-acting local anesthetic, loaded on a 10cc syringe with a 25G, 1.5-in  Needle. An appropriate amount of time was allowed for local anesthetics to take effect before proceeding to the next step. Local Anesthetic: Lidocaine 2.0% The unused portion of the local anesthetic was discarded in the proper designated containers. Technical description of process:  L2 Medial Branch Nerve Block (MBB): The target area for the L2 medial branch is at the junction of the postero-lateral aspect of the superior articular process and the superior, posterior, and medial edge of the transverse process of L3. Under fluoroscopic guidance, a Quincke needle was inserted until contact was made with os over the superior postero-lateral aspect of the pedicular shadow (target area). After negative aspiration for blood, 0.5 mL of the nerve block solution was  injected without difficulty or complication. The needle was removed intact. L3 Medial Branch Nerve Block (MBB): The target area for the L3 medial branch is at the junction of the postero-lateral aspect of the superior articular process and the superior, posterior, and medial edge of the transverse process of L4.  Under fluoroscopic guidance, a Quincke needle was inserted until contact was made with os over the superior postero-lateral aspect of the pedicular shadow (target area). After negative aspiration for blood, 0.5 mL of the nerve block solution was injected without difficulty or complication. The needle was removed intact. L4 Medial Branch Nerve Block (MBB): The target area for the L4 medial branch is at the junction of the postero-lateral aspect of the superior articular process and the superior, posterior, and medial edge of the transverse process of L5. Under fluoroscopic guidance, a Quincke needle was inserted until contact was made with os over the superior postero-lateral aspect of the pedicular shadow (target area). After negative aspiration for blood, 0.5 mL of the nerve block solution was injected without difficulty or complication. The needle was removed intact. L5 Medial Branch Nerve Block (MBB): The target area for the L5 medial branch is at the junction of the postero-lateral aspect of the superior articular process and the superior, posterior, and medial edge of the sacral ala. Under fluoroscopic guidance, a Quincke needle was inserted until contact was made with os over the superior postero-lateral aspect of the pedicular shadow (target area). After negative aspiration for blood, 0.5 mL of the nerve block solution was injected without difficulty or complication. The needle was removed intact. S1 Medial Branch Nerve Block (MBB): The target area for the S1 medial branch is at the posterior and inferior 6 o'clock position of the L5-S1 facet joint. Under fluoroscopic guidance, the Quincke needle  inserted for the L5 MBB was redirected until contact was made with os over the inferior and postero aspect of the sacrum, at the 6 o' clock position under the L5-S1 facet joint (Target area). After negative aspiration for blood, 0.5 mL of the nerve block solution was injected without difficulty or complication. The needle was removed intact.  Once the entire procedure was completed, the treated area was cleaned, making sure to leave some of the prepping solution back to take advantage of its long term bactericidal properties.         Illustration of the posterior view of the lumbar spine and the posterior neural structures. Laminae of L2 through S1 are labeled. DPRL5, dorsal primary ramus of L5; DPRS1, dorsal primary ramus of S1; DPR3, dorsal primary ramus of L3; FJ, facet (zygapophyseal) joint L3-L4; I, inferior articular process of L4; LB1, lateral branch of dorsal primary ramus of L1; IAB, inferior articular branches from L3 medial branch (supplies L4-L5 facet joint); IBP, intermediate branch plexus; MB3, medial branch of dorsal primary ramus of L3; NR3, third lumbar nerve root; S, superior articular process of L5; SAB, superior articular branches from L4 (supplies L4-5 facet joint also); TP3, transverse process of L3.   Facet Joint Innervation (* possible contribution)  L1-2 T12, L1 (L2*)  Medial Branch  L2-3 L1, L2 (L3*)         "          "  L3-4 L2, L3 (L4*)         "          "  L4-5 L3, L4 (L5*)         "          "  L5-S1 L4, L5, S1          "          "    Vitals:   03/27/23 1150 03/27/23 1159 03/27/23 1209 03/27/23 1219  BP: (!) 151/107 (!) 140/86 123/77 Marland Kitchen)  142/67  Pulse:      Resp: 16 16 16 16   Temp:  98 F (36.7 C)    SpO2: 100% 95% 94% 93%  Weight:      Height:         End Time: 1149 hrs.  Imaging Guidance (Spinal):          Type of Imaging Technique: Fluoroscopy Guidance (Spinal) Indication(s): Assistance in needle guidance and placement for procedures requiring  needle placement in or near specific anatomical locations not easily accessible without such assistance. Exposure Time: Please see nurses notes. Contrast: None used. Fluoroscopic Guidance: I was personally present during the use of fluoroscopy. "Tunnel Vision Technique" used to obtain the best possible view of the target area. Parallax error corrected before commencing the procedure. "Direction-depth-direction" technique used to introduce the needle under continuous pulsed fluoroscopy. Once target was reached, antero-posterior, oblique, and lateral fluoroscopic projection used confirm needle placement in all planes. Images permanently stored in EMR. Interpretation: No contrast injected. I personally interpreted the imaging intraoperatively. Adequate needle placement confirmed in multiple planes. Permanent images saved into the patient's record.  Post-operative Assessment:  Post-procedure Vital Signs:  Pulse/HCG Rate: 7773 Temp: 98 F (36.7 C) Resp: 16 BP: (!) 142/67 SpO2: 93 %  EBL: None  Complications: No immediate post-treatment complications observed by team, or reported by patient.  Note: The patient tolerated the entire procedure well. A repeat set of vitals were taken after the procedure and the patient was kept under observation following institutional policy, for this type of procedure. Post-procedural neurological assessment was performed, showing return to baseline, prior to discharge. The patient was provided with post-procedure discharge instructions, including a section on how to identify potential problems. Should any problems arise concerning this procedure, the patient was given instructions to immediately contact us, at any time, without hesitation. In any case, we plan to contact the patient by telephone for a follow-up status report regarding this interventional procedure.  Comments:  No additional relevant information.  Plan of Care (POC)  Orders:  Orders Placed This  Encounter  Procedures   LUMBAR FACET(MEDIAL BRANCH NERVE BLOCK) MBNB    Scheduling Instructions:     Procedure: Lumbar facet block (AKA.: Lumbosacral medial branch nerve block)     Side: Bilateral     Level: L3-4, L4-5, L5-S1, and TBD Facets (L2, L3, L4, L5, S1, and TBD Medial Branch Nerves)     Sedation: Patient's choice.     Timeframe: Today    Order Specific Question:   Where will this procedure be performed?    Answer:   [facility]   DG PAIN [facility] C-ARM 1-60 MIN NO REPORT    Intraoperative interpretation by procedural physician at [facility].    Standing Status:   Standing    Number of Occurrences:   1    Order Specific Question:   Reason for exam:    Answer:   Assistance in needle guidance and placement for procedures requiring needle placement in or near specific anatomical locations not easily accessible without such assistance.   Ambulatory referral to Neurosurgery    Referral Priority:   Routine    Referral Type:   Surgical    Referral Reason:   Specialty Services Required    Referred to Provider:   Venetia Night, MD    Requested Specialty:   Neurosurgery    Number of Visits Requested:   1   Provide equipment / supplies at bedside    Procedure tray: "  Block Tray" (Disposable  single use) Skin infiltration needle: Regular 1.5-in, 25-G, (x1) Block Needle type: Spinal Amount/quantity: 4 Size: Regular (3.5-inch) Gauge: 22G    Standing Status:   Standing    Number of Occurrences:   1    Order Specific Question:   Specify    Answer:   Block Tray   Informed Consent Details: Physician/Practitioner Attestation; Transcribe to consent form and obtain patient signature    Nursing Order: Transcribe to consent form and obtain patient signature. Note: Always confirm laterality of pain with Mr. Haberkorn, before procedure.    Order Specific Question:   Physician/Practitioner attestation of informed consent for procedure/surgical case    Answer:   I, the  physician/practitioner, attest that I have discussed with the patient the benefits, risks, side effects, alternatives, likelihood of achieving goals and potential problems during recovery for the procedure that I have provided informed consent.    Order Specific Question:   Procedure    Answer:   Lumbar Facet Block  under fluoroscopic guidance    Order Specific Question:   Physician/Practitioner performing the procedure    Answer:   Nichlos Kunzler A. Laban Emperor MD    Order Specific Question:   Indication/Reason    Answer:   Low Back Pain, with our without leg pain, due to Facet Joint Arthralgia (Joint Pain) Spondylosis (Arthritis of the Spine), without myelopathy or radiculopathy (Nerve Damage).   Care order/instruction: Please confirm that the patient has stopped the Eliquis (Apixaban) x 3 days prior to procedure or surgery.    Please confirm that the patient has stopped the Eliquis (Apixaban) x 3 days prior to procedure or surgery.    Standing Status:   Standing    Number of Occurrences:   1   Bleeding precautions    Standing Status:   Standing    Number of Occurrences:   1   Chronic Opioid Analgesic:  Hydromorphone 4 mg tablet, 1 tab p.o. 3 times daily MME/day: 60 mg/day    Medications ordered for procedure: Meds ordered this encounter  Medications   lidocaine (XYLOCAINE) 2 % (with pres) injection 400 mg   pentafluoroprop-tetrafluoroeth (GEBAUERS) aerosol   lactated ringers infusion   midazolam (VERSED) 5 MG/5ML injection 0.5-2 mg    Make sure Flumazenil is available in the pyxis when using this medication. If oversedation occurs, administer 0.2 mg IV over 15 sec. If after 45 sec no response, administer 0.2 mg again over 1 min; may repeat at 1 min intervals; not to exceed 4 doses (1 mg)   fentaNYL (SUBLIMAZE) injection 25-50 mcg    Make sure Narcan is available in the pyxis when using this medication. In the event of respiratory depression (RR< 8/min): Titrate NARCAN (naloxone) in increments  of 0.1 to 0.2 mg IV at 2-3 minute intervals, until desired degree of reversal.   ropivacaine (PF) 2 mg/mL (0.2%) (NAROPIN) injection 18 mL   triamcinolone acetonide (KENALOG-40) injection 80 mg   Medications administered: We administered lidocaine, pentafluoroprop-tetrafluoroeth, lactated ringers, midazolam, fentaNYL, ropivacaine (PF) 2 mg/mL (0.2%), and triamcinolone acetonide.  See the medical record for exact dosing, route, and time of administration.  Follow-up plan:   Return in about 2 weeks (around 04/10/2023) for (Face2F), (PPE) & Cervical Spine Eval..       Interventional Therapies  Risk Factors  Complex Considerations:  Anticoagulation: Eliquis (Stop: 3 days  Restart: 6 hrs)  T2IDDM  CAD  CHF  COPD  Hx. Nephrolithiasis  A-Fib  HTN  IBS  Hx. NSTEMI  MI  Hx DVT  Depression  Anxiety    Planned  Pending:   Diagnostic/therapeutic bilateral lumbar facet MBB #1    Under consideration:   Diagnostic/therapeutic bilateral lumbar facet MBB #1  Therapeutic/palliative left L4-5 LESI #1  Therapeutic left caudal ESI #2  Therapeutic bilateral lower extremity Qutenza treatment #1    Completed:   Diagnostic left caudal ESI and epidurogram x1 (04/24/2021) (LBP: 50/50/0  LEP; 100/100/50-60)  Diagnostic intrathecal pump catheter test/evaluation x1 (06/21/2021) (Dx: Catheter malfunction/dysfunction)  Intrathecal catheter and pump removal by me (12/20/2021) (2ry to end of battery life & catheter malfunction)    Completed by other providers:   Diagnostic EMG/PNCV (LE) (05/22/2021) by Dr. 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  Unable to rule out bilateral L5 chronic radiculopathies. Referral (02/07/2021 & 09/19/2021) to [facility] neurosurgery Lucy Chris, MD) for ITP replacement.  Cardiology clearance to Dr. Juliann Pares requested (09/19/2021)    Therapeutic  Palliative (PRN) options:   None established   Pharmacotherapy   Nonopioids transferred (03/20/2022): Pregabalin (Lyrica) 75 mg TID.        Recent Visits Date Type Provider Dept  02/12/23 Office Visit Delano Metz, MD [facility]  Showing recent visits within past 90 days and meeting all other requirements Today's Visits Date Type Provider Dept  03/27/23 Procedure visit Delano Metz, MD [facility]  Showing today's visits and meeting all other requirements Future Appointments Date Type Provider Dept  04/10/23 Appointment Delano Metz, MD [facility]  05/07/23 Appointment Delano Metz, MD [facility]  Showing future appointments within next 90 days and meeting all other requirements  Disposition: Discharge home  Discharge (Date  Time): 03/27/2023; 1219 hrs.   Primary Care Physician: Lynnea Ferrier, MD Location: [facility] Note by: Oswaldo Done, MD (TTS technology used. I apologize for any typographical errors that were not detected and corrected.) Date: 03/27/2023; Time: 6:00 PM  Disclaimer:  Medicine is not an Visual merchandiser. The only guarantee in medicine is that nothing is guaranteed. It is important to note that the decision to proceed with this intervention was based on the information collected from the patient. The Data and conclusions were drawn from the patient's questionnaire, the interview, and the physical examination. Because the information was provided in large part by the patient, it cannot be guaranteed that it has not been purposely or unconsciously manipulated. Every effort has been made to obtain as much relevant data as possible for this evaluation. It is important to note that the conclusions that lead to this procedure are derived in large part from the available data. Always take into account that the treatment will also be dependent on availability of resources and existing treatment guidelines, considered by other Pain  Management Practitioners as being common knowledge and practice, at the time of the intervention. For Medico-Legal purposes, it is also important to point out that variation in procedural techniques and pharmacological choices are the acceptable norm. The indications, contraindications, technique, and results of the above procedure should only be interpreted and judged by a Board-Certified Interventional Pain Specialist with extensive familiarity and expertise in the same exact procedure and technique.

## 2023-03-28 ENCOUNTER — Telehealth: Payer: Self-pay

## 2023-03-28 NOTE — Telephone Encounter (Signed)
Home phone number was not in service.  LM on wife cell phone for post procedure follow up.

## 2023-04-07 ENCOUNTER — Inpatient Hospital Stay
Admission: RE | Admit: 2023-04-07 | Discharge: 2023-04-07 | Disposition: A | Discharge status: Home / Self Care | Payer: Self-pay | Source: Ambulatory Visit | Attending: Neurosurgery | Admitting: Neurosurgery

## 2023-04-07 ENCOUNTER — Other Ambulatory Visit: Payer: Self-pay | Admitting: Family Medicine

## 2023-04-07 DIAGNOSIS — Z049 Encounter for examination and observation for unspecified reason: Secondary | ICD-10-CM

## 2023-04-08 NOTE — Progress Notes (Unsigned)
Referring Physician:  Delano Metz, MD [address] [place],  [place] [address]  Primary Physician:  Lynnea Ferrier, MD  History of Present Illness: 04/09/2023 [name] is here today with a chief complaint of approximately 1 year of chronic neck pain.  He has previously seen a spine surgeon who at that point did not feel like he would benefit from a cervical spinal fusion.  He states that most of his pain starts in his neck and then radiates to his shoulders.  Does not radiate down past his shoulders to his elbows are especially not into his hands.  He has not noticed any weakness.  He has not noticed any numbness or loss of sensation.  He has not noticed any changes in his gait or bowel or bladder issues.  He often states that his neck feels like fire.  Its worsened with certain positions.  He has not noted anything to give it significant improvement.  He has not yet participated in physical therapy.  He has not yet had any cervical injections.  He has never been evaluated for primary shoulder pathology.  I have utilized the care everywhere function in epic to review the outside records available from external health systems.  Review of Systems:  A 10 point review of systems is negative, except for the pertinent positives and negatives detailed in the HPI.  Past Medical History: Past Medical History:  Diagnosis Date   Acute anterolateral wall MI (HCC) 2000   a.) PCI with stent x 1 to LCx (type unknown).   Acute non-Q wave non-ST elevation myocardial infarction (NSTEMI) (HCC) 12/03/2004   a.) LHC 12/04/2004: EF 50%; 75% pLAD, 30% pRCA, 30% mRCA --> PCI performed placing a 3.0 x 13 mm Cypher DES to pLAD.   Adenomatous polyp of colon    Agent orange exposure    a.) [place] War   Angina pectoris (HCC)    Anxiety    Aortic atherosclerosis (HCC)    Asthma    Bladder stones    Bradycardia    CHF (congestive heart failure) (HCC)    Chronic lower back  pain    a.) post-laminectomy syndrome   Chronic pain syndrome    a.) on COT; has naloxone Rx in place   COPD (chronic obstructive pulmonary disease) (HCC)    Coronary artery disease    a.) LHC 04/18/1987: normal cors.  b.) MI 2000 --> PCI with stent x 1 to LCx (unknown type). c.) LHC 02/03/2002: 20% pRCA, 20% mRCA-1, 20% mRCA-2, 50% mLAD; med mgmt. d.) NSTEMI 12/04/2004 --> LHC 75% pLAD, 30% pRCA, 30% mRCA --> PCI placing a 3.0x76mm Cypher DES. e.) LHC 02/19/2008: 40 % mLAD, 25% pLCx, 30% pRCA-1, 40% pRCA-2, 40% mRCA, 10% dRCA; 10% ISR pLAD and LCx; med mgmt   Depression    DJD (degenerative joint disease)    DVT of lower extremity, bilateral (HCC)    a.) IVC filter placed 09/2007; removed 2011.   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 b.) rate/rhythm maintained without pharmacological intervention; not currently on chronic anticoagulation therapy   Paralysis of RIGHT diaphragm    Peripheral vascular disease (HCC)    Rhabdomyolysis 2006   Senile purpura (HCC)    Sepsis due to pneumonia (HCC) 10/28/2021   Stasis dermatitis of both legs    T2DM (type 2 diabetes mellitus) (HCC)    Therapeutic opioid-induced constipation (OIC)    a.) takes daily Senokot-S + bisacodyl    Past Surgical History: Past Surgical History:  Procedure Laterality Date   COLONOSCOPY N/A 11/22/2013    COLONOSCOPY N/A 09/19/2008   COLONOSCOPY N/A 08/25/2003   COLONOSCOPY N/A 10/15/1994   COLONOSCOPY WITH PROPOFOL N/A 02/27/2018   Procedure: COLONOSCOPY WITH PROPOFOL;  Surgeon: Scot Jun, MD;  Location: [facility] ENDOSCOPY;  Service: Endoscopy;  Laterality: N/A;   CORONARY ANGIOPLASTY     2000   CORONARY ANGIOPLASTY WITH STENT PLACEMENT Left 12/04/2004   Procedure: CORONARY ANGIOPLASTY WITH STENT PLACEMENT; Location: [facility]; Surgeon: Arnoldo Hooker, MD   EXTERNAL EAR SURGERY     x3   INTRATHECAL PUMP IMPLANTATION N/A 03/2008   INTRATHECAL PUMP REVISION N/A 09/28/2014   IVC FILTER PLACEMENT ([facility] HX) N/A 09/2007   IVC FILTER REMOVAL N/A 2011   LEFT HEART CATH AND CORONARY ANGIOGRAPHY Left 02/19/2008   Procedure: LEFT HEART CATH AND CORONARY ANGIOGRAPHY; Location: [facility]; Surgeon: Arnoldo Hooker, MD   LEFT HEART CATH AND CORONARY ANGIOGRAPHY Left 04/18/1987   Procedure: LEFT HEART CATH AND CORONARY ANGIOGRAPHY; Location: [facility]; Surgeon: Cephas Darby, MD   LEFT HEART CATH AND CORONARY ANGIOGRAPHY Left 02/03/2002   Procedure: LEFT HEART CATH AND CORONARY ANGIOGRAPHY; Location: [facility]; Surgeon: Rudean Hitt, MD   MASTOIDECTOMY     x 3   NISSEN FUNDOPLICATION N/A 2000   PAIN PUMP REMOVAL N/A 12/20/2021   Procedure: PAIN PUMP REMOVAL;  Surgeon: Delano Metz, MD;  Location: [facility] ORS;  Service: Neurosurgery;  Laterality: N/A;   PARTIAL COLECTOMY N/A    Procedure: PARTIAL BOWEL RESECTION WITH LYSIS OF ADHESIONS   POSTERIOR LAMINECTOMY / DECOMPRESSION CERVICAL SPINE N/A 1984   POSTERIOR LAMINECTOMY / DECOMPRESSION CERVICAL SPINE N/A 1985   POSTERIOR LUMBAR FUSION N/A 1999   SKIN CANCER EXCISION     TONSILLECTOMY Bilateral 1958   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   Procedure: TRANSURETHRAL RESECTION OF BLADDER TUMOR (TURBT);  Surgeon: Orson Ape, MD;  Location: [facility] ORS;  Service: Urology;  Laterality: N/A;    Allergies: Allergies as of 04/09/2023 - Review  Complete 04/09/2023  Allergen Reaction Noted   Celebrex [celecoxib] Nausea Only 09/19/2021   Codeine Nausea Only 03/02/2015   Penicillins Other (See Comments) 01/10/2015   Sherryll Burger [sacubitril-valsartan] Itching and Swelling 09/25/2022    Medications:  Current Outpatient Medications:    albuterol (VENTOLIN HFA) 108 (90 Base) MCG/ACT inhaler, Inhale into the lungs every 6 (six) hours as needed for wheezing or shortness of breath., Disp: , Rfl:    amitriptyline (ELAVIL) 100 MG tablet, Take 100 mg by mouth at bedtime., Disp: , Rfl:    aspirin EC 81 MG EC tablet, Take 1 tablet (81 mg total) by mouth daily. Swallow whole., Disp: 30 tablet, Rfl: 11   azelastine (ASTELIN) 0.1 % nasal spray, Place 1 spray into both nostrils as  needed., Disp: , Rfl:    bisacodyl (DULCOLAX) 5 MG EC tablet, Take 5 mg by mouth daily as needed for moderate constipation., Disp: , Rfl:    cloNIDine (CATAPRES) 0.1 MG tablet, Take 0.1 mg by mouth 2 (two) times daily., Disp: , Rfl:    DULoxetine (CYMBALTA) 60 MG capsule, 60 mg every morning., Disp: , Rfl:    ELIQUIS 5 MG TABS tablet, Take 1 tablet (5 mg total) by mouth 2 (two) times daily., Disp: 60 tablet, Rfl: 5   HYDROmorphone (DILAUDID) 4 MG tablet, Take 1 tablet (4 mg total) by mouth every 8 (eight) hours as needed. Must last 30 days., Disp: 90 tablet, Rfl: 0   [START ON 04/13/2023] HYDROmorphone (DILAUDID) 4 MG tablet, Take 1 tablet (4 mg total) by mouth every 8 (eight) hours as needed. Must last 30 days., Disp: 90 tablet, Rfl: 0   levothyroxine (SYNTHROID) 125 MCG tablet, Take 125 mcg by mouth daily before breakfast., Disp: , Rfl:    loratadine (CLARITIN) 10 MG tablet, Take 10 mg by mouth at bedtime., Disp: , Rfl:    losartan (COZAAR) 50 MG tablet, Take 1 tablet (50 mg total) by mouth daily., Disp: 90 tablet, Rfl: 3   naloxone (NARCAN) nasal spray 4 mg/0.1 mL, Place 1 spray into the nose as needed for up to 365 doses (for opioid-induced respiratory depresssion). In case  of emergency (overdose), spray once into each nostril. If no response within 3 minutes, repeat application and call 911., Disp: 1 each, Rfl: 0   ondansetron (ZOFRAN) 4 MG tablet, Take 4 mg by mouth every 8 (eight) hours as needed., Disp: , Rfl:    simvastatin (ZOCOR) 40 MG tablet, Take 40 mg by mouth every morning., Disp: , Rfl:    HYDROmorphone (DILAUDID) 4 MG tablet, Take 1 tablet (4 mg total) by mouth every 8 (eight) hours as needed. Must last 30 days., Disp: 90 tablet, Rfl: 0   pregabalin (LYRICA) 75 MG capsule, Take 1 capsule (75 mg total) by mouth 3 (three) times daily., Disp: 90 capsule, Rfl: 5  Social History: Social History   Tobacco Use   Smoking status: Every Day    Current packs/day: 0.75    Average packs/day: 0.8 packs/day for 59.0 years (44.3 ttl pk-yrs)    Types: Cigarettes   Smokeless tobacco: Never  Vaping Use   Vaping status: Never Used  Substance Use Topics   Alcohol use: No   Drug use: No    Family Medical History: Family History  Problem Relation Age of Onset   Heart disease Mother    Emphysema Father    Heart attack Father    Emphysema Sister    Heart disease Brother     Physical Examination: Vitals:   04/09/23 1309  BP: 124/78    General: Patient is in no apparent distress. Attention to examination is appropriate.  Neck:   Supple.  Full range of motion.  Respiratory: Patient is breathing without any difficulty.   NEUROLOGICAL:     Awake, alert, oriented to person, place, and time.  Speech is clear and fluent.   Cranial Nerves: Pupils equal round and reactive to light.  Facial tone is symmetric.  Facial sensation is symmetric. Shoulder shrug is symmetric. Tongue protrusion is midline.    Strength: Side Biceps Triceps Deltoid Interossei Grip Wrist Ext. Wrist Flex.  R 5 5 5 5 5 5 5   L 5 5 5 5 5 5 5    Reflexes are 2+ and symmetric at  the biceps, triceps, brachioradialis, patella and achilles.   Hoffman's is absent. Clonus is  absent  Bilateral upper and lower extremity sensation is intact to light touch.     No evidence of dysmetria noted.  Gait is normal.    Imaging:  Narrative & Impression  CLINICAL DATA:  Neck pain   EXAM: MRI CERVICAL SPINE WITHOUT CONTRAST   TECHNIQUE: Multiplanar, multisequence MR imaging of the cervical spine was performed. No intravenous contrast was administered.   COMPARISON:  MRI C Spine 10/01/21   FINDINGS: Alignment: Straightening of the normal cervical lordosis.   Vertebrae: No fracture, evidence of discitis, or bone lesion.   Cord: Normal signal and morphology.   Posterior Fossa, vertebral arteries, paraspinal tissues: Negative.   Disc levels:   C1-C2: Mild degenerative change.   C2-C3: Moderate bilateral facet degenerative change. Minimal circumferential disc bulge. Mild spinal canal narrowing. Moderate bilateral neural foraminal narrowing. Findings are unchanged from prior exam.   C3-C4: Severe bilateral facet degenerative change, right-greater-than-left. Minimal disc bulge. Severe bilateral neural foraminal narrowing. Moderate spinal canal narrowing. The degree of neural foraminal narrowing has progressed compared to 2020.   C4-C5: Severe bilateral facet degenerative change. Minimal disc bulge. There is deformation of the ventral spinal cord. Moderate spinal canal narrowing. Severe bilateral neural foraminal narrowing. The degree of spinal canal and neural foraminal stenosis is slightly progressed compared to 2020.   C5-C6: Severe disc space loss. Circumferential disc bulge. Moderate spinal canal narrowing. Moderate bilateral facet degenerative change. Uncovertebral hypertrophy. Severe bilateral neural foraminal narrowing. Findings are unchanged compared to 2020.   C6-C7: Severe disc space loss. Right paracentral disc protrusion. Severe spinal canal narrowing. Severe bilateral neural foraminal narrowing. Findings are unchanged compared to prior  exam.   C7-T1: Severe bilateral facet degenerative change. Uncovertebral hypertrophy. Moderate to severe spinal canal stenosis. Moderate bilateral neural foraminal stenosis. Findings are unchanged compared to prior exam.   IMPRESSION: 1. Redemonstrated severe multilevel degenerative changes most notably at C6-C7 where there is moderate to severe spinal canal stenosis, unchanged compared to prior exam. Moderate spinal canal stenosis is also present at C3-C4, C4-C5, and C7-T1. The degree of spinal canal stenosis is slightly progressed at C4-C5. 2. Combination of severe facet degenerative change and uncovertebral hypertrophy results in severe neural foraminal stenosis at C3-C7. The degree of neural foraminal stenosis has slightly progressed at C3-C4 compared to 2023.     Electronically Signed   By: Lorenza Cambridge M.D.   On: 10/15/2022 09:53    I have personally reviewed the images and agree with the above interpretation.  Medical Decision Making/Assessment and Plan: Mr. Gidcumb is a pleasant 77 y.o. male with a history of 1 year of worsening neck pain.  He states that this radiates into his shoulders but not down to his arms.  He noticed that it is worse in multiple positions.  Also worsens with activity.  He does not have any numbness or weakness.  He states its mostly burning pain that starts in his neck or can often go to 1 shoulder the other.  It is not always in both at the same time.  Is not always persistent.  He is often bothered with 1 or the other.  He has not noticed any other neurologic symptoms.  He has never been evaluated for primary shoulder pathology.  In regards to his cervicalgia we would like him to start some physical therapy.  He has not yet had evaluation by physical therapy.  He does have radiating  pain from his neck to his shoulders, however this does not go down to his arms.  Given the fact that most of his stenosis is noted that C6-C7 that it would be hard for [place] to  localize this to his shoulder pain.  We like him to undergo an EMG/nerve conduction study to evaluate for any radiculopathy.  Should he not have any radiculopathy could consider a workup for primary shoulder pathology.  A shoulder examination and clinic was not clearly provocative for impingement or rotator cuff pathology.  Would like to get cervical flexion-extension film, as sometimes cross-sectional imaging can be misleading as it is done in the recumbent position.  Should he have any excess motion we will consider whether or not this could be contributing to his symptoms.  Should he need a spinal fusion in the future we have let him know that it would be imperative that he quit smoking has nicotine can have severe negative consequences and cervical spinal fusion surgery especially.  Would like to see him back after physical therapy and his EMG.   Lovenia Kim MD/MSCR Neurosurgery

## 2023-04-09 ENCOUNTER — Ambulatory Visit: Payer: Medicare (Managed Care) | Attending: Pain Medicine | Admitting: Pain Medicine

## 2023-04-09 ENCOUNTER — Encounter: Payer: Self-pay | Admitting: Neurosurgery

## 2023-04-09 ENCOUNTER — Encounter: Payer: Self-pay | Admitting: Pain Medicine

## 2023-04-09 ENCOUNTER — Ambulatory Visit: Payer: Medicare (Managed Care) | Admitting: Neurosurgery

## 2023-04-09 VITALS — BP 124/78 | Ht 75.0 in | Wt 193.0 lb

## 2023-04-09 VITALS — BP 141/80 | HR 75 | Temp 98.0°F | Resp 18 | Ht 75.0 in | Wt 196.0 lb

## 2023-04-09 DIAGNOSIS — M25511 Pain in right shoulder: Secondary | ICD-10-CM

## 2023-04-09 DIAGNOSIS — M47817 Spondylosis without myelopathy or radiculopathy, lumbosacral region: Secondary | ICD-10-CM

## 2023-04-09 DIAGNOSIS — M5137 Other intervertebral disc degeneration, lumbosacral region: Secondary | ICD-10-CM

## 2023-04-09 DIAGNOSIS — G8929 Other chronic pain: Secondary | ICD-10-CM | POA: Diagnosis present

## 2023-04-09 DIAGNOSIS — M4802 Spinal stenosis, cervical region: Secondary | ICD-10-CM | POA: Diagnosis not present

## 2023-04-09 DIAGNOSIS — M5459 Other low back pain: Secondary | ICD-10-CM | POA: Diagnosis present

## 2023-04-09 DIAGNOSIS — M25512 Pain in left shoulder: Secondary | ICD-10-CM

## 2023-04-09 DIAGNOSIS — M545 Low back pain, unspecified: Secondary | ICD-10-CM

## 2023-04-09 DIAGNOSIS — M4316 Spondylolisthesis, lumbar region: Secondary | ICD-10-CM

## 2023-04-09 DIAGNOSIS — F17210 Nicotine dependence, cigarettes, uncomplicated: Secondary | ICD-10-CM

## 2023-04-09 DIAGNOSIS — M51379 Other intervertebral disc degeneration, lumbosacral region without mention of lumbar back pain or lower extremity pain: Secondary | ICD-10-CM

## 2023-04-09 DIAGNOSIS — M5010 Cervical disc disorder with radiculopathy, unspecified cervical region: Secondary | ICD-10-CM | POA: Diagnosis not present

## 2023-04-09 DIAGNOSIS — Z7901 Long term (current) use of anticoagulants: Secondary | ICD-10-CM | POA: Diagnosis present

## 2023-04-09 DIAGNOSIS — M47816 Spondylosis without myelopathy or radiculopathy, lumbar region: Secondary | ICD-10-CM | POA: Diagnosis present

## 2023-04-09 DIAGNOSIS — M961 Postlaminectomy syndrome, not elsewhere classified: Secondary | ICD-10-CM | POA: Diagnosis present

## 2023-04-09 DIAGNOSIS — M542 Cervicalgia: Secondary | ICD-10-CM

## 2023-04-09 DIAGNOSIS — F17200 Nicotine dependence, unspecified, uncomplicated: Secondary | ICD-10-CM

## 2023-04-09 NOTE — Patient Instructions (Signed)
______________________________________________________________________    Procedure instructions  Stop blood-thinners  Do not eat or drink fluids (other than water) for 6 hours before your procedure  No water for 2 hours before your procedure  Take your blood pressure medicine with a sip of water  Arrive 30 minutes before your appointment  If sedation is planned, bring suitable driver. Pennie Banter, Benedetto Goad, & public transportation are NOT APPROVED)  Carefully read the "Preparing for your procedure" detailed instructions  If you have questions call us at [phone]  ______________________________________________________________________      ______________________________________________________________________    Preparing for your procedure  Appointments: If you think you may not be able to keep your appointment, call 24-48 hours in advance to cancel. We need time to make it available to others.  During your procedure appointment there will be: No Prescription Refills. No disability issues to discussed. No medication changes or discussions.  Instructions: Food intake: Avoid eating anything solid for at least 8 hours prior to your procedure. Clear liquid intake: You may take clear liquids such as water up to 2 hours prior to your procedure. (No carbonated drinks. No soda.) Transportation: Unless otherwise stated by your physician, bring a driver. (Driver cannot be a Market researcher, Pharmacist, community, or any other form of public transportation.) Morning Medicines: Except for blood thinners, take all of your other morning medications with a sip of water. Make sure to take your heart and blood pressure medicines. If your blood pressure's lower number is above 100, the case will be rescheduled. Blood thinners: Make sure to stop your blood thinners as instructed.  If you take a blood thinner, but were not instructed to stop it, call our office [phone] and ask to talk to a nurse. Not stopping a blood  thinner prior to certain procedures could lead to serious complications. Diabetics on insulin: Notify the staff so that you can be scheduled 1st case in the morning. If your diabetes requires high dose insulin, take only  of your normal insulin dose the morning of the procedure and notify the staff that you have done so. Preventing infections: Shower with an antibacterial soap the morning of your procedure.  Build-up your immune system: Take 1000 mg of Vitamin C with every meal (3 times a day) the day prior to your procedure. Antibiotics: Inform the nursing staff if you are taking any antibiotics or if you have any conditions that may require antibiotics prior to procedures. (Example: recent joint implants)   Pregnancy: If you are pregnant make sure to notify the nursing staff. Not doing so may result in injury to the fetus, including death.  Sickness: If you have a cold, fever, or any active infections, call and cancel or reschedule your procedure. Receiving steroids while having an infection may result in complications. Arrival: You must be in the facility at least 30 minutes prior to your scheduled procedure. Tardiness: Your scheduled time is also the cutoff time. If you do not arrive at least 15 minutes prior to your procedure, you will be rescheduled.  Children: Do not bring any children with you. Make arrangements to keep them home. Dress appropriately: There is always a possibility that your clothing may get soiled. Avoid long dresses. Valuables: Do not bring any jewelry or valuables.  Reasons to call and reschedule or cancel your procedure: (Following these recommendations will minimize the risk of a serious complication.) Surgeries: Avoid having procedures within 2 weeks of any surgery. (Avoid for 2 weeks before or after any surgery). Flu Shots:  Avoid having procedures within 2 weeks of a flu shots or . (Avoid for 2 weeks before or after immunizations). Barium: Avoid having a procedure  within 7-10 days after having had a radiological study involving the use of radiological contrast. (Myelograms, Barium swallow or enema study). Heart attacks: Avoid any elective procedures or surgeries for the initial 6 months after a "Myocardial Infarction" (Heart Attack). Blood thinners: It is imperative that you stop these medications before procedures. Let us know if you if you take any blood thinner.  Infection: Avoid procedures during or within two weeks of an infection (including chest colds or gastrointestinal problems). Symptoms associated with infections include: Localized redness, fever, chills, night sweats or profuse sweating, burning sensation when voiding, cough, congestion, stuffiness, runny nose, sore throat, diarrhea, nausea, vomiting, cold or Flu symptoms, recent or current infections. It is specially important if the infection is over the area that we intend to treat. Heart and lung problems: Symptoms that may suggest an active cardiopulmonary problem include: cough, chest pain, breathing difficulties or shortness of breath, dizziness, ankle swelling, uncontrolled high or unusually low blood pressure, and/or palpitations. If you are experiencing any of these symptoms, cancel your procedure and contact your primary care physician for an evaluation.  Remember:  Regular Business hours are:  Monday to Thursday 8:00 AM to 4:00 PM  Provider's Schedule: Delano Metz, MD:  Procedure days: Tuesday and Thursday 7:30 AM to 4:00 PM  Edward Jolly, MD:  Procedure days: Monday and Wednesday 7:30 AM to 4:00 PM Last  Updated: 03/11/2023 ______________________________________________________________________      ______________________________________________________________________    General Risks and Possible Complications  Patient Responsibilities: It is important that you read this as it is part of your informed consent. It is our duty to inform you of the risks and possible  complications associated with treatments offered to you. It is your responsibility as a patient to read this and to ask questions about anything that is not clear or that you believe was not covered in this document.  Patient's Rights: You have the right to refuse treatment. You also have the right to change your mind, even after initially having agreed to have the treatment done. However, under this last option, if you wait until the last second to change your mind, you may be charged for the materials used up to that point.  Introduction: Medicine is not an Visual merchandiser. Everything in Medicine, including the lack of treatment(s), carries the potential for danger, harm, or loss (which is by definition: Risk). In Medicine, a complication is a secondary problem, condition, or disease that can aggravate an already existing one. All treatments carry the risk of possible complications. The fact that a side effects or complications occurs, does not imply that the treatment was conducted incorrectly. It must be clearly understood that these can happen even when everything is done following the highest safety standards.  No treatment: You can choose not to proceed with the proposed treatment alternative. The "PRO(s)" would include: avoiding the risk of complications associated with the therapy. The "CON(s)" would include: not getting any of the treatment benefits. These benefits fall under one of three categories: diagnostic; therapeutic; and/or palliative. Diagnostic benefits include: getting information which can ultimately lead to improvement of the disease or symptom(s). Therapeutic benefits are those associated with the successful treatment of the disease. Finally, palliative benefits are those related to the decrease of the primary symptoms, without necessarily curing the condition (example: decreasing the pain from a flare-up  of a chronic condition, such as incurable terminal cancer).  General Risks and  Complications: These are associated to most interventional treatments. They can occur alone, or in combination. They fall under one of the following six (6) categories: no benefit or worsening of symptoms; bleeding; infection; nerve damage; allergic reactions; and/or death. No benefits or worsening of symptoms: In Medicine there are no guarantees, only probabilities. No healthcare provider can ever guarantee that a medical treatment will work, they can only state the probability that it may. Furthermore, there is always the possibility that the condition may worsen, either directly, or indirectly, as a consequence of the treatment. Bleeding: This is more common if the patient is taking a blood thinner, either prescription or over the counter (example: Goody Powders, Fish oil, Aspirin, Garlic, etc.), or if suffering a condition associated with impaired coagulation (example: Hemophilia, cirrhosis of the liver, low platelet counts, etc.). However, even if you do not have one on these, it can still happen. If you have any of these conditions, or take one of these drugs, make sure to notify your treating physician. Infection: This is more common in patients with a compromised immune system, either due to disease (example: diabetes, cancer, human immunodeficiency virus [HIV], etc.), or due to medications or treatments (example: therapies used to treat cancer and rheumatological diseases). However, even if you do not have one on these, it can still happen. If you have any of these conditions, or take one of these drugs, make sure to notify your treating physician. Nerve Damage: This is more common when the treatment is an invasive one, but it can also happen with the use of medications, such as those used in the treatment of cancer. The damage can occur to small secondary nerves, or to large primary ones, such as those in the spinal cord and brain. This damage may be temporary or permanent and it may lead to  impairments that can range from temporary numbness to permanent paralysis and/or brain death. Allergic Reactions: Any time a substance or material comes in contact with our body, there is the possibility of an allergic reaction. These can range from a mild skin rash (contact dermatitis) to a severe systemic reaction (anaphylactic reaction), which can result in death. Death: In general, any medical intervention can result in death, most of the time due to an unforeseen complication. ______________________________________________________________________      ______________________________________________________________________    Blood Thinners  IMPORTANT NOTICE:  If you take any of these, make sure to notify the nursing staff.  Failure to do so may result in serious injury.  Recommended time intervals to stop and restart blood-thinners, before & after invasive procedures  Generic Name Brand Name Pre-procedure: Stop medication for this amount of time before your procedure: Post-procedure: Wait this amount of time after the procedure before restarting your medication:  Abciximab Reopro 15 days 2 hrs  Alteplase Activase 10 days 10 days  Anagrelide Agrylin    Apixaban Eliquis 3 days 6 hrs  Cilostazol Pletal 3 days 5 hrs  Clopidogrel Plavix 7-10 days 2 hrs  Dabigatran Pradaxa 5 days 6 hrs  Dalteparin Fragmin 24 hours 4 hrs  Dipyridamole Aggrenox 11days 2 hrs  Edoxaban Lixiana; Savaysa 3 days 2 hrs  Enoxaparin  Lovenox 24 hours 4 hrs  Eptifibatide Integrillin 8 hours 2 hrs  Fondaparinux  Arixtra 72 hours 12 hrs  Hydroxychloroquine Plaquenil 11 days   Prasugrel Effient 7-10 days 6 hrs  Reteplase Retavase 10 days 10 days  Rivaroxaban  Xarelto 3 days 6 hrs  Ticagrelor Brilinta 5-7 days 6 hrs  Ticlopidine Ticlid 10-14 days 2 hrs  Tinzaparin Innohep 24 hours 4 hrs  Tirofiban Aggrastat 8 hours 2 hrs  Warfarin Coumadin 5 days 2 hrs   Other medications with blood-thinning effects  Product  indications Generic (Brand) names Note  Cholesterol Lipitor Stop 4 days before procedure  Blood thinner (injectable) Heparin (LMW or LMWH Heparin) Stop 24 hours before procedure  Cancer Ibrutinib (Imbruvica) Stop 7 days before procedure  Malaria/Rheumatoid Hydroxychloroquine (Plaquenil) Stop 11 days before procedure  Thrombolytics  10 days before or after procedures   Over-the-counter (OTC) Products with blood-thinning effects  Product Common names Stop Time  Aspirin > 325 mg Goody Powders, Excedrin, etc. 11 days  Aspirin <= 81 mg  7 days  Fish oil  4 days  Garlic supplements  7 days  Ginkgo biloba  36 hours  Ginseng  24 hours  NSAIDs Ibuprofen, Naprosyn, etc. 3 days  Vitamin E  4 days   ______________________________________________________________________

## 2023-04-09 NOTE — Patient Instructions (Signed)
LOCAL PHYSICAL THERAPY  [facility]  [address]  [place], [place] [address]  [phone]  [facility]  [address] [place], [place] [address]  [phone]  [facility] (2 locations)  [address]  [place], [place] [address]  [phone]          or  [address]  [place], [place] [address]  [phone]  [facility]  [address]  [place], [place] [address]  [phone]  **dry needling**  [facility]  [address]  [place], [place] [address]  [phone]  Fax: [phone]  ** Aquatic therapy[address] [place], [place] [address] [phone] **Aquatic therapy**  [facility]  [facility]  [address]  [place], [place] [address]  [phone]  [facility]  [address]  [place], [place] [address]  [phone]  [facility]  [address]   Janora Norlander  [place], [place] [address]  [phone]  [facility]  [address]  [place], [place] [address]  [phone]  **dry needling**   PELVIC FLOOR/SI JOINT  [facility]  Mariane Masters, PT  [email or web address]   [place]  [facility]  [address]  [address] [place], [place] [address]  [phone]   [facility] [place]  [address], [place] [address]  [phone]   [facility], [place]  [facility]  Raymond Gurney, PT  [address]  [place], [place] [address] [phone]   Samara Deist  [facility]  [address]  [phone]   [facility]  [address]  [phone]   [facility]  Annamaria Boots [facility]  [address]  [place], [place] [address]  [phone]   **No Aetna or medicaid**  [facility]  [phone] [address] [place] [address]  [phone]  [facility]  [address]  [place], [place] [address]  [phone]  Dannial Monarch, [place]  [address] [place], [place] [address]  [phone]  [facility]  [address] [place] [address]  [phone]  [facility]  [address]  [place], [place] [address]  [phone]  [facility]  (3 locations)  [address]  [place], [place] [address]  [phone]  **dry needling**           or  [address] [place], [place] [address]  [phone]  **Additionally has Gloris Manchester, OT**           or  [address]  [place], [place] [address]  [phone]  **Pediatric therapy**  [facility]  [address]  [phone]  **dry needlingVerdie Drown [facility]  [address]  [place], [place] [address]  [phone]  [facility]   (Inside [address] Fitness)  [address]  [place], [place] [address]  [phone]  **dry needling**  **MEDICAID or UNINSURED** [facility] de[facility] Of Physical Therapy [place], [place] [address] [phone]  [facility]  [address] [place], [place] [address]  [phone]   [facility]  [address]  [place], [place] [address]  [phone]   Doreatha Martin  [facility]  [address] [place], [place] [address]  [phone]   [facility]  [address]  [place], [place] [address]  [phone]   [facility]  [address] [place], [place] [address]  [phone]  [facility]  [address]  [place], [place] [address]  [phone]  AQUATIC  Kathalene Frames [facility]  [employer]  [facility]  [place]  *Residents only*   [facility] [employer]  [facility]  Exercise class  [facility]  Exercise class  [facility]  [address]  [place], [place]   [phone]  [facility]  [address]  [place], [place] [address]  [phone]   [place], [place]  [facility]  [address]  [phone]   [facility]  [facility]  [address]  [phone]           or  [address]  [phone]   [facility]   Provides education and support and practical information for coping with arthritis for arthritis sufferers and their families.   When: 12:15 - 1:30 p.m. the second Monday of each month, March through December  Info: Call Rehabilitation Services at [phone]

## 2023-04-09 NOTE — Progress Notes (Signed)
PROVIDER NOTE: Information contained herein reflects review and annotations entered in association with encounter. Interpretation of such information and data should be left to medically-trained personnel. Information provided to patient can be located elsewhere in the medical record under "Patient Instructions". Document created using STT-dictation technology, any transcriptional errors that may result from process are unintentional.    Patient: [name]  Service Category: E/M  Provider: Oswaldo Done, MD  DOB: 1946-04-07  DOS: 04/09/2023  Referring Provider: Lynnea Ferrier, MD  MRN: [id number]  Specialty: Interventional Pain Management  PCP: Lynnea Ferrier, MD  Type: Established Patient  Setting: Ambulatory outpatient    Location: Office  Delivery: Face-to-face     HPI  [name], a 77 y.o. year old male, is here today because of his Chronic bilateral low back pain without sciatica [M54.50, G89.29]. [name] primary complain today is Back Pain (lower)  Pertinent problems: 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); Chronic ankle pain (Bilateral); Pain in joint, ankle and foot; Polyneuropathy associated with underlying disease (HCC); Ankle sprain; Rotator cuff sprain; Lumbosacral radiculopathy at S1; Chronic feet pain (3ry area of Pain) (Bilateral) (R>L); Chronic knee pain (4th area of Pain) (Bilateral) (R>L); Failed back surgical syndrome; Lower extremity weakness (Bilateral); Chronic neuropathic pain; Peripheral neurogenic pain; Nonfamilial nocturnal leg cramps (Bilateral); Restless leg syndrome; Pain in right knee; DDD (degenerative disc disease), lumbosacral; Abnormal MRI, lumbar spine  (02/12/2022); 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; Lumbar facet arthropathy (Multilevel) (Bilateral); Lumbosacral facet syndrome (Bilateral); Lumbar foraminal narrowing (Multilevel) (Bilateral); Cervicalgia; Pain of both shoulder joints; Radicular pain of shoulder; Occipital neuralgia (Right); Cervico-occipital neuralgia (Right); Cervical facet hypertrophy (Multilevel) (Bilateral); Cervical facet pain; Painful cervical range of motion; Chronic neurogenic pain; Left rib fracture; Chronic painful diabetic neuropathy (HCC); Foot pain; Lumbago; 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); Decreased range of motion of lumbar spine; Lumbar paraspinal muscle spasm; Spondylosis without myelopathy or radiculopathy, lumbosacral region; Chronic low back pain (Bilateral) w/ sciatica (Left); Chronic lower extremity pain (Left); Lumbosacral radiculopathy at L5; DDD (degenerative disc disease), cervical; Abnormal MRI, cervical spine (10/15/2022); Lumbar facet joint pain; Central spinal stenosis (L3-4, L4-5); Lumbar lateral recess (subarticular zone) stenosis (Left: L2-3) (Bilateral: L3-4, L4-5) (L>R); Hypertrophy of ligamentum flavum (L3-4); Lumbar intervertebral disc displacement (IVDD); Lumbar nerve root impingement (Left: L3 at L2-3 & L3-4) (Right: L3 at L3-4); Diabetic generalized sensorimotor polyneuropathy (HCC); Chronic low back pain (Bilateral) w/o sciatica; Low back pain of over 3 months duration; and Lumbar facet joint syndrome on their pertinent problem list. Pain Assessment: Severity of Chronic pain is reported as a 8 /10. Location: Back Lower/right leg to the toes. Onset: More than a month ago. Quality: Aching, Sharp. Timing: Constant. Modifying factor(s): rest. Vitals:  height is 6\' 3"  (1.905 m) and weight is 196 lb (88.9 kg). His temporal temperature is 98 F (36.7 C). His blood pressure is 141/80 (abnormal) and his pulse is 75. His respiration is 18 and oxygen saturation  is 97%.  BMI: Estimated body mass index is 24.5 kg/m as calculated from the following:   Height as of this encounter: 6\' 3"  (1.905 m).   Weight as of this encounter: 196 lb (88.9 kg). Last encounter: 02/12/2023. Last procedure: 03/27/2023.  Reason for encounter: post-procedure evaluation and assessment. The patient indicates having attained 100% relief of the pain for the duration of the local anesthetic which by the end of the day had gone down to an 80% improvement and by the next day was gone.  Based on these results, it would suggest that a good portion of his pain is coming from the facet joints.  Today I took the time to explain to the patient and his wife the diagnostic purpose of the local anesthetics and steroids and what is it that each one of them tell us based on those results.  Based on the information given to me by the patient, it would seem that a good portion of his pain is coming from the area of the facet joints, but very little of it has to do with an inflammatory process therefore it is more likely to be mechanical.  This would be expected seen that he has already had surgery in the area and he has had anatomic mechanical changes.  Based on the results, I have proposed to repeat the diagnostic injection and if again the patient gets good relief of the pain for the duration of the local anesthetic, then we may consider radiofrequency ablation.  Today I have explained to the patient what this radiofrequency ablation is and the reason for the second diagnostic injection.  The above plan was shared with the patient who understood and accepted.  Post-procedure evaluation   Procedure: Lumbar Facet, Medial Branch Block(s) #1  Laterality: Bilateral  Level: L2, L3, L4, L5, and S1 Medial Branch Level(s). Injecting these levels blocks the L3-4, L4-5, and L5-S1 lumbar facet joints. Imaging: Fluoroscopic guidance Spinal ([id number]) Anesthesia: Local anesthesia (1-2% Lidocaine) Anxiolysis: IV  Versed         Sedation: Moderate Sedation                       DOS: 03/27/2023 Performed by: Oswaldo Done, MD  Primary Purpose: Diagnostic/Therapeutic Indications: Low back pain severe enough to impact quality of life or function. 1. 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 Lumbar facet joint syndrome   3. Spondylosis without myelopathy or radiculopathy, lumbosacral region   4. Lumbar facet joint pain   5. 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 Low back pain of over 3 months duration   7. 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 DDD (degenerative disc disease), lumbosacral   9. Chronic anticoagulation (Eliquis)    NAS-11 Pain score:   Pre-procedure: 10-Worst pain ever/10   Post-procedure: 0/10   NOTE: The patient has requested to have an evaluation of his cervical spine upon his return to the clinic as he is having some new problems associated with that area.  A recent MRI of the cervical spine done on 10/11/2022 indicated the following:  (10/11/2022) CERVICAL MRI IMPRESSION: 1. Redemonstrated severe multilevel degenerative changes most notably at C6-7 where there is moderate to severe spinal canal stenosis, unchanged compared to prior exam. Moderate spinal canal stenosis is also present at C3-4, C4-5, and C7-T1. The degree of spinal canal stenosis is slightly progressed at C4-5. 2. Combination of severe facet degenerative change and uncovertebral hypertrophy results in severe neural foraminal stenosis at C3-C7. The degree of neural foraminal stenosis has slightly progressed at C3-4 compared to 2023.  He is interested in getting a referral to neurosurgery for possible surgical alternatives.     Effectiveness:  Initial hour  after procedure: 100 %. Subsequent 4-6 hours post-procedure: 100 %. Analgesia past initial 6 hours: 80 %. Ongoing improvement:  Analgesic: The patient indicates having attained 100% relief of the pain for the duration  of the local anesthetic which by the end of the day had gone down to an 80% improvement and by the next day was gone. Function: Mr. Heagney reports improvement in function while he was numb ROM: Mr. Frattini reports improvement in ROM while he was numb  Pharmacotherapy Assessment  Analgesic: Hydromorphone 4 mg tablet, 1 tab p.o. 3 times daily MME/day: 60 mg/day    Monitoring: [place] PMP: PDMP reviewed during this encounter.       Pharmacotherapy: No side-effects or adverse reactions reported. Compliance: No problems identified. Effectiveness: Clinically acceptable.  No notes on file  No results found for: "CBDTHCR" No results found for: "D8THCCBX" No results found for: "D9THCCBX"  UDS:  Summary  Date Value Ref Range Status  10/14/2022 Note  Final    Comment:    ==================================================================== [employer] Select 13 (MW) ==================================================================== Test                             Result       Flag       Units  Drug Present and Declared for Prescription Verification   Hydromorphone                  3914         EXPECTED   ng/mg creat    Hydromorphone may be administered as a scheduled prescription    medication; it is also an expected metabolite of hydrocodone.  ==================================================================== Test                      Result    Flag   Units      Ref Range   Creatinine              84               mg/dL      >=16 ==================================================================== Declared Medications:  The flagging and interpretation on this report are based on the  following declared medications.  Unexpected results may arise from  inaccuracies in the declared medications.   **Note: The testing scope of this panel includes these medications:   Hydromorphone (Dilaudid)   **Note: The testing scope of this panel does not include the  following reported medications:    Albuterol (Ventolin HFA)  Amitriptyline (Elavil)  Apixaban (Eliquis)  Aspirin  Azelastine (Astelin)  Bisacodyl  Clonidine (Catapres)  Duloxetine (Cymbalta)  Esomeprazole (Nexium)  Latanoprost (Xalatan)  Levothyroxine (Synthroid)  Loratadine (Claritin)  Losartan (Cozaar)  Mexiletine  Naloxone (Narcan)  Pregabalin (Lyrica)  Promethazine (Phenergan)  Simvastatin (Zocor)  Tizanidine (Zanaflex) ==================================================================== For clinical consultation, please call [phone]. ====================================================================       ROS  Constitutional: Denies any fever or chills Gastrointestinal: No reported hemesis, hematochezia, vomiting, or acute GI distress Musculoskeletal: Denies any acute onset joint swelling, redness, loss of ROM, or weakness Neurological: No reported episodes of acute onset apraxia, aphasia, dysarthria, agnosia, amnesia, paralysis, loss of coordination, or loss of consciousness  Medication Review  DULoxetine, HYDROmorphone, albuterol, amitriptyline, apixaban, aspirin EC, azelastine, bisacodyl, cloNIDine, levothyroxine, loratadine, losartan, naloxone, ondansetron, pregabalin, and simvastatin  History Review  Allergy: Mr. Baerwald is allergic to celebrex [celecoxib], codeine, penicillins, and entresto [sacubitril-valsartan]. Drug: Mr. Hoglund  reports no history of  drug use. Alcohol:  reports no history of alcohol use. Tobacco:  reports that he has been smoking cigarettes. He has a 44.3 pack-year smoking history. He has never used smokeless tobacco. Social: Mr. Pipitone  reports that he has been smoking cigarettes. He has a 44.3 pack-year smoking history. He has never used smokeless tobacco. He reports that he does not drink alcohol and does not use drugs. Medical:  has a past medical history of Acute anterolateral wall MI (HCC) (2000), Acute non-Q wave non-ST elevation myocardial infarction (NSTEMI) (HCC)  (12/03/2004), Adenomatous polyp of colon, Agent orange exposure, Angina pectoris (HCC), Anxiety, Aortic atherosclerosis (HCC), Asthma, Bladder stones, Bradycardia, CHF (congestive heart failure) (HCC), Chronic lower back pain, Chronic pain syndrome, COPD (chronic obstructive pulmonary disease) (HCC), Coronary artery disease, Depression, DJD (degenerative joint disease), DVT of lower extremity, bilateral (HCC), Dyspnea, GERD (gastroesophageal reflux disease), History of hiatal hernia, HLD (hyperlipidemia), Hypertension, Hypothyroidism, Inflammatory polyarthropathy (HCC), Internal hemorrhoids, Long term prescription opiate use, 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 Surgical: Mr. Mackiewicz  has a past surgical history that includes Coronary angioplasty; Posterior lumbar fusion (N/A, 1999); Nissen fundoplication (N/A, 2000); Mastoidectomy; Partial colectomy (N/A); Intrathecal pump implantation (N/A, 03/2008); Intrathecal pump revision (N/A, 09/28/2014); Transurethral resection of bladder tumor (N/A, 03/07/2015); Colonoscopy with propofol (N/A, 02/27/2018); External ear surgery; Skin cancer excision; IVC FILTER PLACEMENT ([facility] HX) (N/A, 09/2007); IVC FILTER REMOVAL (N/A, 2011); Colonoscopy (N/A, 11/22/2013); Colonoscopy (N/A, 09/19/2008); Colonoscopy (N/A, 08/25/2003); Colonoscopy (N/A, 10/15/1994); Posterior laminectomy / decompression cervical spine (N/A, 1984); Posterior laminectomy / decompression cervical spine (N/A, 1985); Tonsillectomy  (Bilateral, 1958); Coronary angioplasty with stent (Left, 12/04/2004); LEFT HEART CATH AND CORONARY ANGIOGRAPHY (Left, 02/19/2008); LEFT HEART CATH AND CORONARY ANGIOGRAPHY (Left, 04/18/1987); LEFT HEART CATH AND CORONARY ANGIOGRAPHY (Left, 02/03/2002); and Pain pump removal (N/A, 12/20/2021). Family: family history includes Emphysema in his father and sister; Heart attack in his father; Heart disease in his brother and mother.  Laboratory Chemistry Profile   Renal Lab Results  Component Value Date   BUN 10 01/30/2022   CREATININE 1.00 03/12/2023   BCR 11 01/30/2022   GFRAA >60 03/02/2015   GFRNONAA >60 11/01/2021    Hepatic Lab Results  Component Value Date   AST 31 01/30/2022   ALBUMIN 4.0 01/30/2022   ALKPHOS 96 01/30/2022    Electrolytes Lab Results  Component Value Date   NA 145 (H) 01/30/2022   K 5.2 01/30/2022   CL 104 01/30/2022   CALCIUM 9.6 01/30/2022   MG 2.3 11/01/2021   PHOS 3.7 11/01/2021    Bone Lab Results  Component Value Date   25OHVITD1 22 (L) 02/07/2021   25OHVITD2 <1.0 02/07/2021   25OHVITD3 22 02/07/2021    Inflammation (CRP: Acute Phase) (ESR: Chronic Phase) Lab Results  Component Value Date   CRP 5 01/30/2022   ESRSEDRATE 11 01/30/2022   LATICACIDVEN 0.9 10/28/2021         Note: Above Lab results reviewed.  Recent Imaging Review  DG PAIN CLINIC C-ARM 1-60 MIN NO REPORT Fluoro was used, but no Radiologist interpretation will be provided.  Please refer to "NOTES" tab for provider progress note. Note: Reviewed  Physical Exam  General appearance: Well nourished, well developed, and well hydrated. In no apparent acute distress Mental status: Alert, oriented x 3 (person, place, & time)       Respiratory: No evidence of acute respiratory distress Eyes: PERLA Vitals: BP (!) 141/80   Pulse 75   Temp 98 F (36.7 C) (Temporal)   Resp 18   Ht 6\' 3"  (1.905 m)   Wt 196 lb (88.9 kg)   SpO2 97%   BMI 24.50 kg/m  BMI: Estimated body mass  index is 24.5 kg/m as calculated from the following:   Height as of this encounter: 6\' 3"  (1.905 m).   Weight as of this encounter: 196 lb (88.9 kg). Ideal: Ideal body weight: 84.5 kg (186 lb 4.6 oz) Adjusted ideal body weight: 86.3 kg (190 lb 2.8 oz)  Assessment   Diagnosis Status  1. 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 Lumbar facet joint syndrome   3. Spondylosis without myelopathy or radiculopathy, lumbosacral region   4. Lumbar facet joint pain   5. 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 Low back pain of over 3 months duration   7. 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 DDD (degenerative disc disease), lumbosacral   9. Chronic anticoagulation (Eliquis)    Controlled Controlled Controlled   Updated Problems: No problems updated.  Plan of Care  Problem-specific:  No problem-specific Assessment & Plan notes found for this encounter.  Mr. CRISTIN THAKER has a current medication list which includes the following long-term medication(s): albuterol, amitriptyline, azelastine, duloxetine, eliquis, hydromorphone, [START ON 04/13/2023] hydromorphone, levothyroxine, loratadine, losartan, simvastatin, hydromorphone, and pregabalin.  Pharmacotherapy (Medications Ordered): No orders of the defined types were placed in this encounter.  Orders:  Orders Placed This Encounter  Procedures   LUMBAR FACET(MEDIAL BRANCH NERVE BLOCK) MBNB    Diagnosis: Lumbar Facet Syndrome (M47.816); Lumbosacral Facet Syndrome (M47.817); Lumbar Facet Joint Pain (M54.59) Medical Necessity Statement: 1.Severe chronic axial low back pain causing functional impairment documented by ongoing pain scale assessments. 2.Pain present for longer than 3 months (Chronic) documented to have failed noninvasive conservative therapies. 3.Absence of untreated radiculopathy. 4.There is no radiological evidence of untreated fractures, tumor, infection, or deformity.  Physical  Examination Findings: Positive Kemp Maneuver: (Y)  Positive Lumbar Hyperextension-Rotation provocative test: (Y)    Standing Status:   Future    Standing Expiration Date:   07/09/2023    Scheduling Instructions:     Procedure: Lumbar facet Block     Type: Medial Branch Block     Side: Bilateral     Purpose: Diagnostic Radiologic Mapping     Level(s): L3-4, L4-5, L5-S1, and TBD by Fluoroscopic Mapping Facets (L2, L3, L4, L5, S1, and TBD Medial Branch)     Sedation: With Sedation.     Timeframe: ASAA    Order Specific Question:   Where will this procedure be performed?    Answer:   [facility]   Blood Thinner Instructions to Nursing    Always make sure patient has clearance from prescribing physician to stop blood thinners for interventional therapies. If the patient requires a Lovenox-bridge therapy, make sure arrangements are made to institute it with the assistance of the PCP.    Scheduling Instructions:     Have Mr. Didomenico stop the Eliquis (Apixaban) x 3 days prior to procedure or surgery.   Follow-up plan:   Return for (ECT): (B) L-FCT Blk #2, (Blood Thinner Protocol).  Interventional Therapies  Risk Factors  Complex Considerations:  Anticoagulation: Eliquis (Stop: 3 days  Restart: 6 hrs)  T2IDDM  CAD  CHF  COPD  Hx. Nephrolithiasis  A-Fib  HTN  IBS  Hx. NSTEMI MI  Hx DVT  Depression  Anxiety    Planned  Pending:   Diagnostic/therapeutic bilateral lumbar facet MBB #1    Under consideration:   Diagnostic/therapeutic bilateral lumbar facet MBB #1  Therapeutic/palliative left L4-5 LESI #1  Therapeutic left caudal ESI #2  Therapeutic bilateral lower extremity Qutenza treatment #1    Completed:   Diagnostic left caudal ESI and epidurogram x1 (04/24/2021) (LBP: 50/50/0  LEP; 100/100/50-60)  Diagnostic intrathecal pump catheter test/evaluation x1 (06/21/2021) (Dx: Catheter malfunction/dysfunction)  Intrathecal catheter and pump removal by me (12/20/2021)  (2ry to end of battery life & catheter malfunction)    Completed by other providers:   Diagnostic EMG/PNCV (LE) (05/22/2021) by Dr. Cristopher Peru [facility]) Dx: Electrodiagnostic evidence of generalized sensorimotor polyneuropathy.  Unable to rule out bilateral L5 chronic radiculopathies. Referral (02/07/2021 & 09/19/2021) to [facility] neurosurgery Lucy Chris, MD) for ITP replacement.  Cardiology clearance to Dr. Juliann Pares requested (09/19/2021)    Therapeutic  Palliative (PRN) options:   None established   Pharmacotherapy  Nonopioids transferred (03/20/2022): Pregabalin (Lyrica) 75 mg TID.         Recent Visits Date Type Provider Dept  03/27/23 Procedure visit Delano Metz, MD [facility]  02/12/23 Office Visit Delano Metz, MD [facility]  Showing recent visits within past 90 days and meeting all other requirements Today's Visits Date Type Provider Dept  04/09/23 Office Visit Delano Metz, MD [facility]  Showing today's visits and meeting all other requirements Future Appointments Date Type Provider Dept  05/07/23 Appointment Delano Metz, MD [facility]  Showing future appointments within next 90 days and meeting all other requirements  I discussed the assessment and treatment plan with the patient. The patient was provided an opportunity to ask questions and all were answered. The patient agreed with the plan and demonstrated an understanding of the instructions.  Patient advised to call back or seek an in-person evaluation if the symptoms or condition worsens.  Duration of encounter: 30 minutes.  Total time on encounter, as per AMA guidelines included both the face-to-face and non-face-to-face time personally spent by the physician and/or other qualified health care professional(s) on the day of the encounter (includes time in activities that require the physician or other qualified health care professional and does  not include time in activities normally performed by clinical staff). Physician's time may include the following activities when performed: Preparing to see the patient (e.g., pre-charting review of records, searching for previously ordered imaging, lab work, and nerve conduction tests) Review of prior analgesic pharmacotherapies. Reviewing PMP Interpreting ordered tests (e.g., lab work, imaging, nerve conduction tests) Performing post-procedure evaluations, including interpretation of diagnostic procedures Obtaining and/or reviewing separately obtained history Performing a medically appropriate examination and/or evaluation Counseling and educating the patient/family/caregiver Ordering medications, tests, or procedures Referring and communicating with other health care professionals (when not separately reported) Documenting clinical information in the electronic or other health record Independently interpreting results (not separately reported) and communicating results to the patient/ family/caregiver Care coordination (not separately reported)  Note by: Oswaldo Done, MD Date: 04/09/2023; Time: 3:58 PM

## 2023-04-10 ENCOUNTER — Ambulatory Visit: Payer: Medicare (Managed Care) | Admitting: Pain Medicine

## 2023-04-22 ENCOUNTER — Other Ambulatory Visit: Payer: Self-pay

## 2023-04-22 ENCOUNTER — Encounter: Payer: Self-pay | Admitting: Neurology

## 2023-04-22 DIAGNOSIS — R202 Paresthesia of skin: Secondary | ICD-10-CM

## 2023-05-07 ENCOUNTER — Encounter: Payer: Medicare (Managed Care) | Admitting: Pain Medicine

## 2023-05-08 ENCOUNTER — Ambulatory Visit: Payer: Medicare (Managed Care) | Attending: Pain Medicine | Admitting: Pain Medicine

## 2023-05-08 ENCOUNTER — Encounter: Payer: Self-pay | Admitting: Pain Medicine

## 2023-05-08 ENCOUNTER — Ambulatory Visit
Admission: RE | Admit: 2023-05-08 | Discharge: 2023-05-08 | Disposition: A | Discharge status: Home / Self Care | Payer: Medicare (Managed Care) | Source: Ambulatory Visit | Attending: Pain Medicine | Admitting: Pain Medicine

## 2023-05-08 VITALS — BP 313/82 | HR 83 | Temp 98.1°F | Resp 15 | Ht 74.0 in | Wt 196.0 lb

## 2023-05-08 DIAGNOSIS — M5441 Lumbago with sciatica, right side: Secondary | ICD-10-CM | POA: Insufficient documentation

## 2023-05-08 DIAGNOSIS — M79672 Pain in left foot: Secondary | ICD-10-CM | POA: Diagnosis not present

## 2023-05-08 DIAGNOSIS — M5137 Other intervertebral disc degeneration, lumbosacral region with discogenic back pain only: Secondary | ICD-10-CM | POA: Diagnosis not present

## 2023-05-08 DIAGNOSIS — M47817 Spondylosis without myelopathy or radiculopathy, lumbosacral region: Secondary | ICD-10-CM | POA: Diagnosis not present

## 2023-05-08 DIAGNOSIS — M25561 Pain in right knee: Secondary | ICD-10-CM | POA: Insufficient documentation

## 2023-05-08 DIAGNOSIS — M5459 Other low back pain: Secondary | ICD-10-CM

## 2023-05-08 DIAGNOSIS — M5442 Lumbago with sciatica, left side: Secondary | ICD-10-CM | POA: Insufficient documentation

## 2023-05-08 DIAGNOSIS — M545 Low back pain, unspecified: Secondary | ICD-10-CM

## 2023-05-08 DIAGNOSIS — G894 Chronic pain syndrome: Secondary | ICD-10-CM

## 2023-05-08 DIAGNOSIS — M79604 Pain in right leg: Secondary | ICD-10-CM | POA: Insufficient documentation

## 2023-05-08 DIAGNOSIS — M79671 Pain in right foot: Secondary | ICD-10-CM | POA: Diagnosis not present

## 2023-05-08 DIAGNOSIS — M47816 Spondylosis without myelopathy or radiculopathy, lumbar region: Secondary | ICD-10-CM | POA: Diagnosis not present

## 2023-05-08 DIAGNOSIS — Z7901 Long term (current) use of anticoagulants: Secondary | ICD-10-CM | POA: Diagnosis not present

## 2023-05-08 DIAGNOSIS — Z79899 Other long term (current) drug therapy: Secondary | ICD-10-CM

## 2023-05-08 DIAGNOSIS — M961 Postlaminectomy syndrome, not elsewhere classified: Secondary | ICD-10-CM

## 2023-05-08 DIAGNOSIS — G8929 Other chronic pain: Secondary | ICD-10-CM

## 2023-05-08 DIAGNOSIS — M4316 Spondylolisthesis, lumbar region: Secondary | ICD-10-CM

## 2023-05-08 DIAGNOSIS — M79605 Pain in left leg: Secondary | ICD-10-CM | POA: Insufficient documentation

## 2023-05-08 DIAGNOSIS — M25562 Pain in left knee: Secondary | ICD-10-CM | POA: Diagnosis not present

## 2023-05-08 DIAGNOSIS — Z79891 Long term (current) use of opiate analgesic: Secondary | ICD-10-CM | POA: Diagnosis not present

## 2023-05-08 MED ORDER — HYDROMORPHONE HCL 4 MG PO TABS: 4.0000 mg | ORAL_TABLET | Freq: Three times a day (TID) | ORAL | 0 refills | Status: DC | PRN

## 2023-05-08 MED ORDER — MIDAZOLAM HCL 5 MG/5ML IJ SOLN
INTRAMUSCULAR | Status: AC
  Filled 2023-05-08: qty 5

## 2023-05-08 MED ORDER — LACTATED RINGERS IV SOLN: Freq: Once | INTRAVENOUS | Status: AC

## 2023-05-08 MED ORDER — MIDAZOLAM HCL 5 MG/5ML IJ SOLN
0.5000 mg | Freq: Once | INTRAMUSCULAR | Status: AC
  Administered 2023-05-08: 2 mg via INTRAVENOUS

## 2023-05-08 MED ORDER — LIDOCAINE HCL 2 % IJ SOLN
20.0000 mL | Freq: Once | INTRAMUSCULAR | Status: AC
  Administered 2023-05-08: 400 mg

## 2023-05-08 MED ORDER — TRIAMCINOLONE ACETONIDE 40 MG/ML IJ SUSP
INTRAMUSCULAR | Status: AC
  Filled 2023-05-08: qty 2

## 2023-05-08 MED ORDER — ROPIVACAINE HCL 2 MG/ML IJ SOLN
18.0000 mL | Freq: Once | INTRAMUSCULAR | Status: AC
  Administered 2023-05-08: 18 mL via PERINEURAL

## 2023-05-08 MED ORDER — PENTAFLUOROPROP-TETRAFLUOROETH EX AERO
INHALATION_SPRAY | Freq: Once | CUTANEOUS | Status: AC
  Administered 2023-05-08: 30 via TOPICAL
  Filled 2023-05-08: qty 30

## 2023-05-08 MED ORDER — LIDOCAINE HCL 2 % IJ SOLN
INTRAMUSCULAR | Status: AC
  Filled 2023-05-08: qty 20

## 2023-05-08 MED ORDER — TRIAMCINOLONE ACETONIDE 40 MG/ML IJ SUSP
80.0000 mg | Freq: Once | INTRAMUSCULAR | Status: AC
  Administered 2023-05-08: 80 mg

## 2023-05-08 MED ORDER — FENTANYL CITRATE (PF) 100 MCG/2ML IJ SOLN
25.0000 ug | INTRAMUSCULAR | Status: DC | PRN
  Administered 2023-05-08: 50 ug via INTRAVENOUS

## 2023-05-08 MED ORDER — ROPIVACAINE HCL 2 MG/ML IJ SOLN
INTRAMUSCULAR | Status: AC
  Filled 2023-05-08: qty 20

## 2023-05-08 MED ORDER — FENTANYL CITRATE (PF) 100 MCG/2ML IJ SOLN
INTRAMUSCULAR | Status: AC
  Filled 2023-05-08: qty 2

## 2023-05-08 NOTE — Progress Notes (Signed)
PROVIDER NOTE: Interpretation of information contained herein should be left to medically-trained personnel. Specific patient instructions are provided elsewhere under "Patient Instructions" section of medical record. This document was created in part using STT-dictation technology, any transcriptional errors that may result from this process are unintentional.  Patient: [name] Type: Established DOB: 1946/05/20 MRN: [id number] PCP: Lynnea Ferrier, MD  Service: Procedure DOS: 05/08/2023 Setting: Ambulatory Location: Ambulatory outpatient facility Delivery: Face-to-face Provider: Oswaldo Done, MD Specialty: Interventional Pain Management Specialty designation: 09 Location: Outpatient facility Ref. Prov.: Delano Metz, MD       Interventional Therapy   Procedure: Lumbar Facet, Medial Branch Block(s) #2  Laterality: Bilateral  Level: L2, L3, L4, L5, and S1 Medial Branch Level(s). Injecting these levels blocks the L3-4, L4-5, and L5-S1 lumbar facet joints. Imaging: Fluoroscopic guidance Spinal ([id number]) Anesthesia: Local anesthesia (1-2% Lidocaine) Anxiolysis: IV Versed 2.0 mg Sedation:   Fentanyl 1 mL (50 mcg) DOS: 05/08/2023 Performed by: Oswaldo Done, MD  Primary Purpose: Diagnostic/Therapeutic Indications: Low back pain severe enough to impact quality of life or function. 1. 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 Low back pain of over 3 months duration   3. Lumbar facet joint pain   4. Lumbar facet joint syndrome   5. Lumbar facet arthropathy (Multilevel) (Bilateral)   6. 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 Lumbosacral facet syndrome (Bilateral)   8. Spondylosis without myelopathy or radiculopathy, lumbosacral region   9. 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 Degeneration of intervertebral disc of lumbosacral region with discogenic back pain    NAS-11 Pain score:   Pre-procedure: 10-Worst pain ever/10    Post-procedure: 0-No pain/10     Position / Prep / Materials:  Position: Prone  Prep solution: DuraPrep (Iodine Povacrylex [0.7% available iodine] and Isopropyl Alcohol, 74% w/w) Area Prepped: Posterolateral Lumbosacral Spine (Wide prep: From the lower border of the scapula down to the end of the tailbone and from flank to flank.)  Materials:  Tray: Block Needle(s):  Type: Spinal  Gauge (G): 22  Length: 5-in Qty: 4      H&P (Pre-op Assessment):  [name] is a 77 y.o. (year old), male patient, seen today for interventional treatment. He  has a past surgical history that includes Coronary angioplasty; Posterior lumbar fusion (N/A, 1999); Nissen fundoplication (N/A, 2000); Mastoidectomy; Partial colectomy (N/A); Intrathecal pump implantation (N/A, 03/2008); Intrathecal pump revision (N/A, 09/28/2014); Transurethral resection of bladder tumor (N/A, 03/07/2015); Colonoscopy with propofol (N/A, 02/27/2018); External ear surgery; Skin cancer excision; IVC FILTER PLACEMENT ([facility] HX) (N/A, 09/2007); IVC FILTER REMOVAL (N/A, 2011); Colonoscopy (N/A, 11/22/2013); Colonoscopy (N/A, 09/19/2008); Colonoscopy (N/A, 08/25/2003); Colonoscopy (N/A, 10/15/1994); Posterior laminectomy / decompression cervical spine (N/A, 1984); Posterior laminectomy / decompression cervical spine (N/A, 1985); Tonsillectomy (Bilateral, 1958); Coronary angioplasty with stent (Left, 12/04/2004); LEFT HEART CATH AND CORONARY ANGIOGRAPHY (Left, 02/19/2008); LEFT HEART CATH AND CORONARY ANGIOGRAPHY (Left, 04/18/1987); LEFT HEART CATH AND CORONARY ANGIOGRAPHY (Left, 02/03/2002); and Pain pump removal (N/A, 12/20/2021). [name] has a current medication list which includes the following prescription(s): albuterol, amitriptyline, aspirin ec, azelastine, bisacodyl, clonidine, duloxetine, eliquis, [START ON 05/15/2023] hydromorphone, levothyroxine, loratadine, losartan, naloxone, ondansetron, simvastatin, and pregabalin, and the following  Facility-Administered Medications: fentanyl. His primarily concern today is the Back Pain (Both side)  Initial Vital Signs:  Pulse/HCG Rate: 83ECG Heart Rate: 79 Temp: 98.1 F (36.7 C) Resp: 14 BP: 128/76 SpO2: 98 %  BMI: Estimated body mass index is  25.16 kg/m as calculated from the following:   Height as of this encounter: 6\' 2"  (1.88 m).   Weight as of this encounter: 196 lb (88.9 kg).  Risk Assessment: Allergies: Reviewed. He is allergic to celebrex [celecoxib], codeine, penicillins, and entresto [sacubitril-valsartan].  Allergy Precautions: None required Coagulopathies: Reviewed. None identified.  Blood-thinner therapy: None at this time Active Infection(s): Reviewed. None identified. [name] is afebrile  Site Confirmation: [name] was asked to confirm the procedure and laterality before marking the site Procedure checklist: Completed Consent: Before the procedure and under the influence of no sedative(s), amnesic(s), or anxiolytics, the patient was informed of the treatment options, risks and possible complications. To fulfill our ethical and legal obligations, as recommended by the American Medical Association's Code of Ethics, I have informed the patient of my clinical impression; the nature and purpose of the treatment or procedure; the risks, benefits, and possible complications of the intervention; the alternatives, including doing nothing; the risk(s) and benefit(s) of the alternative treatment(s) or procedure(s); and the risk(s) and benefit(s) of doing nothing. The patient was provided information about the general risks and possible complications associated with the procedure. These may include, but are not limited to: failure to achieve desired goals, infection, bleeding, organ or nerve damage, allergic reactions, paralysis, and death. In addition, the patient was informed of those risks and complications associated to Spine-related procedures, such as failure to decrease  pain; infection (i.e.: Meningitis, epidural or intraspinal abscess); bleeding (i.e.: epidural hematoma, subarachnoid hemorrhage, or any other type of intraspinal or peri-dural bleeding); organ or nerve damage (i.e.: Any type of peripheral nerve, nerve root, or spinal cord injury) with subsequent damage to sensory, motor, and/or autonomic systems, resulting in permanent pain, numbness, and/or weakness of one or several areas of the body; allergic reactions; (i.e.: anaphylactic reaction); and/or death. Furthermore, the patient was informed of those risks and complications associated with the medications. These include, but are not limited to: allergic reactions (i.e.: anaphylactic or anaphylactoid reaction(s)); adrenal axis suppression; blood sugar elevation that in diabetics may result in ketoacidosis or comma; water retention that in patients with history of congestive heart failure may result in shortness of breath, pulmonary edema, and decompensation with resultant heart failure; weight gain; swelling or edema; medication-induced neural toxicity; particulate matter embolism and blood vessel occlusion with resultant organ, and/or nervous system infarction; and/or aseptic necrosis of one or more joints. Finally, the patient was informed that Medicine is not an exact science; therefore, there is also the possibility of unforeseen or unpredictable risks and/or possible complications that may result in a catastrophic outcome. The patient indicated having understood very clearly. We have given the patient no guarantees and we have made no promises. Enough time was given to the patient to ask questions, all of which were answered to the patient's satisfaction. [name] has indicated that he wanted to continue with the procedure. Attestation: I, the ordering provider, attest that I have discussed with the patient the benefits, risks, side-effects, alternatives, likelihood of achieving goals, and potential problems  during recovery for the procedure that I have provided informed consent. Date  Time: 05/08/2023  8:58 AM   Pre-Procedure Preparation:  Monitoring: As per clinic protocol. Respiration, ETCO2, SpO2, BP, heart rate and rhythm monitor placed and checked for adequate function Safety Precautions: Patient was assessed for positional comfort and pressure points before starting the procedure. Time-out: I initiated and conducted the "Time-out" before starting the procedure, as per protocol. The patient was asked to participate  by confirming the accuracy of the "Time Out" information. Verification of the correct person, site, and procedure were performed and confirmed by me, the nursing staff, and the patient. "Time-out" conducted as per Joint Commission's Universal Protocol (UP.01.01.01). Time: 1059 Start Time: 1059 hrs.  Description of Procedure:          Laterality: (see above) Targeted Levels: (see above)  Safety Precautions: Aspiration looking for blood return was conducted prior to all injections. At no point did we inject any substances, as a needle was being advanced. Before injecting, the patient was told to immediately notify me if he was experiencing any new onset of "ringing in the ears, or metallic taste in the mouth". No attempts were made at seeking any paresthesias. Safe injection practices and needle disposal techniques used. Medications properly checked for expiration dates. SDV (single dose vial) medications used. After the completion of the procedure, all disposable equipment used was discarded in the proper designated medical waste containers. Local Anesthesia: Protocol guidelines were followed. The patient was positioned over the fluoroscopy table. The area was prepped in the usual manner. The time-out was completed. The target area was identified using fluoroscopy. A 12-in long, straight, sterile hemostat was used with fluoroscopic guidance to locate the targets for each level blocked.  Once located, the skin was marked with an approved surgical skin marker. Once all sites were marked, the skin (epidermis, dermis, and hypodermis), as well as deeper tissues (fat, connective tissue and muscle) were infiltrated with a small amount of a short-acting local anesthetic, loaded on a 10cc syringe with a 25G, 1.5-in  Needle. An appropriate amount of time was allowed for local anesthetics to take effect before proceeding to the next step. Local Anesthetic: Lidocaine 2.0% The unused portion of the local anesthetic was discarded in the proper designated containers. Technical description of process:  L2 Medial Branch Nerve Block (MBB): The target area for the L2 medial branch is at the junction of the postero-lateral aspect of the superior articular process and the superior, posterior, and medial edge of the transverse process of L3. Under fluoroscopic guidance, a Quincke needle was inserted until contact was made with os over the superior postero-lateral aspect of the pedicular shadow (target area). After negative aspiration for blood, 0.5 mL of the nerve block solution was injected without difficulty or complication. The needle was removed intact. L3 Medial Branch Nerve Block (MBB): The target area for the L3 medial branch is at the junction of the postero-lateral aspect of the superior articular process and the superior, posterior, and medial edge of the transverse process of L4. Under fluoroscopic guidance, a Quincke needle was inserted until contact was made with os over the superior postero-lateral aspect of the pedicular shadow (target area). After negative aspiration for blood, 0.5 mL of the nerve block solution was injected without difficulty or complication. The needle was removed intact. L4 Medial Branch Nerve Block (MBB): The target area for the L4 medial branch is at the junction of the postero-lateral aspect of the superior articular process and the superior, posterior, and medial edge of the  transverse process of L5. Under fluoroscopic guidance, a Quincke needle was inserted until contact was made with os over the superior postero-lateral aspect of the pedicular shadow (target area). After negative aspiration for blood, 0.5 mL of the nerve block solution was injected without difficulty or complication. The needle was removed intact. L5 Medial Branch Nerve Block (MBB): The target area for the L5 medial branch is at the junction  of the postero-lateral aspect of the superior articular process and the superior, posterior, and medial edge of the sacral ala. Under fluoroscopic guidance, a Quincke needle was inserted until contact was made with os over the superior postero-lateral aspect of the pedicular shadow (target area). After negative aspiration for blood, 0.5 mL of the nerve block solution was injected without difficulty or complication. The needle was removed intact. S1 Medial Branch Nerve Block (MBB): The target area for the S1 medial branch is at the posterior and inferior 6 o'clock position of the L5-S1 facet joint. Under fluoroscopic guidance, the Quincke needle inserted for the L5 MBB was redirected until contact was made with os over the inferior and postero aspect of the sacrum, at the 6 o' clock position under the L5-S1 facet joint (Target area). After negative aspiration for blood, 0.5 mL of the nerve block solution was injected without difficulty or complication. The needle was removed intact.  Once the entire procedure was completed, the treated area was cleaned, making sure to leave some of the prepping solution back to take advantage of its long term bactericidal properties.         Illustration of the posterior view of the lumbar spine and the posterior neural structures. Laminae of L2 through S1 are labeled. DPRL5, dorsal primary ramus of L5; DPRS1, dorsal primary ramus of S1; DPR3, dorsal primary ramus of L3; FJ, facet (zygapophyseal) joint L3-L4; I, inferior articular process  of L4; LB1, lateral branch of dorsal primary ramus of L1; IAB, inferior articular branches from L3 medial branch (supplies L4-L5 facet joint); IBP, intermediate branch plexus; MB3, medial branch of dorsal primary ramus of L3; NR3, third lumbar nerve root; S, superior articular process of L5; SAB, superior articular branches from L4 (supplies L4-5 facet joint also); TP3, transverse process of L3.   Facet Joint Innervation (* possible contribution)  L1-2 T12, L1 (L2*)  Medial Branch  L2-3 L1, L2 (L3*)         "          "  L3-4 L2, L3 (L4*)         "          "  L4-5 L3, L4 (L5*)         "          "  L5-S1 L4, L5, S1          "          "    Vitals:   05/08/23 1114 05/08/23 1120 05/08/23 1130 05/08/23 1139  BP: (!) 162/92 (!) 145/85 133/84 (!) 313/82  Pulse:      Resp: 16 17 16 15   Temp:      SpO2: 100% 98% 99% 100%  Weight:      Height:         End Time: 0200 hrs.  Imaging Guidance (Spinal):          Type of Imaging Technique: Fluoroscopy Guidance (Spinal) Indication(s): Assistance in needle guidance and placement for procedures requiring needle placement in or near specific anatomical locations not easily accessible without such assistance. Exposure Time: Please see nurses notes. Contrast: None used. Fluoroscopic Guidance: I was personally present during the use of fluoroscopy. "Tunnel Vision Technique" used to obtain the best possible view of the target area. Parallax error corrected before commencing the procedure. "Direction-depth-direction" technique used to introduce the needle under continuous pulsed fluoroscopy. Once target was reached, antero-posterior, oblique, and lateral fluoroscopic projection used confirm needle  placement in all planes. Images permanently stored in EMR. Interpretation: No contrast injected. I personally interpreted the imaging intraoperatively. Adequate needle placement confirmed in multiple planes. Permanent images saved into the patient's  record.  Post-operative Assessment:  Post-procedure Vital Signs:  Pulse/HCG Rate: 8378 Temp: 98.1 F (36.7 C) Resp: 15 BP: (!) 313/82 SpO2: 100 %  EBL: None  Complications: No immediate post-treatment complications observed by team, or reported by patient.  Note: The patient tolerated the entire procedure well. A repeat set of vitals were taken after the procedure and the patient was kept under observation following institutional policy, for this type of procedure. Post-procedural neurological assessment was performed, showing return to baseline, prior to discharge. The patient was provided with post-procedure discharge instructions, including a section on how to identify potential problems. Should any problems arise concerning this procedure, the patient was given instructions to immediately contact us, at any time, without hesitation. In any case, we plan to contact the patient by telephone for a follow-up status report regarding this interventional procedure.  Comments:  No additional relevant information.  Plan of Care (POC)  Orders:  Orders Placed This Encounter  Procedures   LUMBAR FACET(MEDIAL BRANCH NERVE BLOCK) MBNB    Scheduling Instructions:     Procedure: Lumbar facet block (AKA.: Lumbosacral medial branch nerve block)     Side: Bilateral     Level: L3-4, L4-5, L5-S1, and TBD Facets (L2, L3, L4, L5, S1, and TBD Medial Branch Nerves)     Sedation: Patient's choice.     Timeframe: Today    Order Specific Question:   Where will this procedure be performed?    Answer:   [facility]   DG [facility] C-ARM 1-60 MIN NO REPORT    Intraoperative interpretation by procedural physician at [facility].    Standing Status:   Standing    Number of Occurrences:   1    Order Specific Question:   Reason for exam:    Answer:   Assistance in needle guidance and placement for procedures requiring needle placement in or near specific anatomical locations not easily  accessible without such assistance.   Informed Consent Details: Physician/Practitioner Attestation; Transcribe to consent form and obtain patient signature    Nursing Order: Transcribe to consent form and obtain patient signature. Note: Always confirm laterality of pain with [name], before procedure.    Order Specific Question:   Physician/Practitioner attestation of informed consent for procedure/surgical case    Answer:   I, the physician/practitioner, attest that I have discussed with the patient the benefits, risks, side effects, alternatives, likelihood of achieving goals and potential problems during recovery for the procedure that I have provided informed consent.    Order Specific Question:   Procedure    Answer:   Lumbar Facet Block  under fluoroscopic guidance    Order Specific Question:   Physician/Practitioner performing the procedure    Answer:   Lerry Cordrey A. Laban Emperor MD    Order Specific Question:   Indication/Reason    Answer:   Low Back Pain, with our without leg pain, due to Facet Joint Arthralgia (Joint Pain) Spondylosis (Arthritis of the Spine), without myelopathy or radiculopathy (Nerve Damage).   Care order/instruction: Please confirm that the patient has stopped the Eliquis (Apixaban) x 3 days prior to procedure or surgery.    Please confirm that the patient has stopped the Eliquis (Apixaban) x 3 days prior to procedure or surgery.    Standing Status:  Standing    Number of Occurrences:   1   Provide equipment / supplies at bedside    Procedure tray: "Block Tray" (Disposable  single use) Skin infiltration needle: Regular 1.5-in, 25-G, (x1) Block Needle type: Spinal Amount/quantity: 4 Size: Medium (5-inch) Gauge: 22G    Standing Status:   Standing    Number of Occurrences:   1    Order Specific Question:   Specify    Answer:   Block Tray   Bleeding precautions    Standing Status:   Standing    Number of Occurrences:   1   Chronic Opioid Analgesic:  Hydromorphone  4 mg tablet, 1 tab p.o. 3 times daily MME/day: 60 mg/day    Medications ordered for procedure: Meds ordered this encounter  Medications   lidocaine (XYLOCAINE) 2 % (with pres) injection 400 mg   pentafluoroprop-tetrafluoroeth (GEBAUERS) aerosol   lactated ringers infusion   midazolam (VERSED) 5 MG/5ML injection 0.5-2 mg    Make sure Flumazenil is available in the pyxis when using this medication. If oversedation occurs, administer 0.2 mg IV over 15 sec. If after 45 sec no response, administer 0.2 mg again over 1 min; may repeat at 1 min intervals; not to exceed 4 doses (1 mg)   fentaNYL (SUBLIMAZE) injection 25-50 mcg    Make sure Narcan is available in the pyxis when using this medication. In the event of respiratory depression (RR< 8/min): Titrate NARCAN (naloxone) in increments of 0.1 to 0.2 mg IV at 2-3 minute intervals, until desired degree of reversal.   ropivacaine (PF) 2 mg/mL (0.2%) (NAROPIN) injection 18 mL   triamcinolone acetonide (KENALOG-40) injection 80 mg   HYDROmorphone (DILAUDID) 4 MG tablet    Sig: Take 1 tablet (4 mg total) by mouth every 8 (eight) hours as needed. Must last 30 days.    Dispense:  90 tablet    Refill:  0    DO NOT: delete (not duplicate); no partial-fill (will deny script to complete), no refill request (F/U required). DISPENSE: 1 day early if closed on fill date. WARN: No CNS-depressants within 8 hrs of med.   Medications administered: We administered lidocaine, pentafluoroprop-tetrafluoroeth, lactated ringers, midazolam, fentaNYL, ropivacaine (PF) 2 mg/mL (0.2%), and triamcinolone acetonide.  See the medical record for exact dosing, route, and time of administration.  Follow-up plan:   Return in about 2 weeks (around 05/22/2023) for (Face2F), (PPE) + (MME).       Interventional Therapies  Risk Factors  Complex Considerations:  Anticoagulation: Eliquis (Stop: 3 days  Restart: 6 hrs)  T2IDDM  CAD  CHF  COPD  Hx. Nephrolithiasis  A-Fib  HTN   IBS  Hx. NSTEMI MI  Hx DVT  Depression  Anxiety    Planned  Pending:   Diagnostic/therapeutic bilateral lumbar facet MBB #1    Under consideration:   Diagnostic/therapeutic bilateral lumbar facet MBB #1  Therapeutic/palliative left L4-5 LESI #1  Therapeutic left caudal ESI #2  Therapeutic bilateral lower extremity Qutenza treatment #1    Completed:   Diagnostic left caudal ESI and epidurogram x1 (04/24/2021) (LBP: 50/50/0  LEP; 100/100/50-60)  Diagnostic intrathecal pump catheter test/evaluation x1 (06/21/2021) (Dx: Catheter malfunction/dysfunction)  Intrathecal catheter and pump removal by me (12/20/2021) (2ry to end of battery life & catheter malfunction)    Completed by other providers:   Diagnostic EMG/PNCV (LE) (05/22/2021) by Dr. 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  Unable to rule out bilateral L5 chronic radiculopathies. Referral (02/07/2021 &  09/19/2021) to [facility] Lucy Chris, MD) for ITP replacement.  Cardiology clearance to Dr. Juliann Pares requested (09/19/2021)    Therapeutic  Palliative (PRN) options:   None established   Pharmacotherapy  Nonopioids transferred (03/20/2022): Pregabalin (Lyrica) 75 mg TID.       Recent Visits Date Type Provider Dept  04/09/23 Office Visit Delano Metz, MD [facility]  03/27/23 Procedure visit Delano Metz, MD [facility]  02/12/23 Office Visit Delano Metz, MD [facility]  Showing recent visits within past 90 days and meeting all other requirements Today's Visits Date Type Provider Dept  05/08/23 Procedure visit Delano Metz, MD [facility]  Showing today's visits and meeting all other requirements Future Appointments Date Type Provider Dept  05/22/23 Appointment Delano Metz, MD [facility]  Showing future appointments within next 90 days and meeting all other  requirements  Disposition: Discharge home  Discharge (Date  Time): 05/08/2023; 1142 hrs.   Primary Care Physician: Lynnea Ferrier, MD Location: [facility] Note by: Oswaldo Done, MD (TTS technology used. I apologize for any typographical errors that were not detected and corrected.) Date: 05/08/2023; Time: 12:18 PM  Disclaimer:  Medicine is not an Visual merchandiser. The only guarantee in medicine is that nothing is guaranteed. It is important to note that the decision to proceed with this intervention was based on the information collected from the patient. The Data and conclusions were drawn from the patient's questionnaire, the interview, and the physical examination. Because the information was provided in large part by the patient, it cannot be guaranteed that it has not been purposely or unconsciously manipulated. Every effort has been made to obtain as much relevant data as possible for this evaluation. It is important to note that the conclusions that lead to this procedure are derived in large part from the available data. Always take into account that the treatment will also be dependent on availability of resources and existing treatment guidelines, considered by other Pain Management Practitioners as being common knowledge and practice, at the time of the intervention. For Medico-Legal purposes, it is also important to point out that variation in procedural techniques and pharmacological choices are the acceptable norm. The indications, contraindications, technique, and results of the above procedure should only be interpreted and judged by a Board-Certified Interventional Pain Specialist with extensive familiarity and expertise in the same exact procedure and technique.

## 2023-05-08 NOTE — Progress Notes (Signed)
Safety precautions to be maintained throughout the outpatient stay will include: orient to surroundings, keep bed in low position, maintain call bell within reach at all times, provide assistance with transfer out of bed and ambulation.  

## 2023-05-09 ENCOUNTER — Telehealth: Payer: Self-pay

## 2023-05-09 ENCOUNTER — Telehealth: Payer: Self-pay | Admitting: Pain Medicine

## 2023-05-09 NOTE — Telephone Encounter (Signed)
PT wife stated that they went to pharmacy to pick up medication was told that it was no medications to be picked up. Please give patient a call. TY

## 2023-05-09 NOTE — Telephone Encounter (Signed)
LM with patient that the medication could be picked up on 05-15-23.

## 2023-05-09 NOTE — Telephone Encounter (Signed)
Post procedure follow up.  LM 

## 2023-05-12 ENCOUNTER — Encounter: Payer: Medicare (Managed Care) | Admitting: Pain Medicine

## 2023-05-20 ENCOUNTER — Encounter: Payer: Self-pay | Admitting: Pain Medicine

## 2023-05-20 ENCOUNTER — Ambulatory Visit: Payer: Medicare (Managed Care) | Attending: Pain Medicine | Admitting: Pain Medicine

## 2023-05-20 VITALS — BP 132/74 | HR 69 | Temp 97.5°F | Resp 16 | Ht 73.0 in | Wt 198.0 lb

## 2023-05-20 DIAGNOSIS — M79671 Pain in right foot: Secondary | ICD-10-CM | POA: Insufficient documentation

## 2023-05-20 DIAGNOSIS — M5441 Lumbago with sciatica, right side: Secondary | ICD-10-CM | POA: Diagnosis not present

## 2023-05-20 DIAGNOSIS — G63 Polyneuropathy in diseases classified elsewhere: Secondary | ICD-10-CM | POA: Diagnosis present

## 2023-05-20 DIAGNOSIS — G8929 Other chronic pain: Secondary | ICD-10-CM | POA: Insufficient documentation

## 2023-05-20 DIAGNOSIS — E1140 Type 2 diabetes mellitus with diabetic neuropathy, unspecified: Secondary | ICD-10-CM | POA: Insufficient documentation

## 2023-05-20 DIAGNOSIS — M79605 Pain in left leg: Secondary | ICD-10-CM | POA: Diagnosis present

## 2023-05-20 DIAGNOSIS — M25562 Pain in left knee: Secondary | ICD-10-CM | POA: Diagnosis present

## 2023-05-20 DIAGNOSIS — M25561 Pain in right knee: Secondary | ICD-10-CM | POA: Diagnosis present

## 2023-05-20 DIAGNOSIS — M961 Postlaminectomy syndrome, not elsewhere classified: Secondary | ICD-10-CM | POA: Diagnosis not present

## 2023-05-20 DIAGNOSIS — Z79899 Other long term (current) drug therapy: Secondary | ICD-10-CM | POA: Diagnosis present

## 2023-05-20 DIAGNOSIS — M79604 Pain in right leg: Secondary | ICD-10-CM | POA: Diagnosis present

## 2023-05-20 DIAGNOSIS — G894 Chronic pain syndrome: Secondary | ICD-10-CM | POA: Diagnosis not present

## 2023-05-20 DIAGNOSIS — M5442 Lumbago with sciatica, left side: Secondary | ICD-10-CM | POA: Insufficient documentation

## 2023-05-20 DIAGNOSIS — E1142 Type 2 diabetes mellitus with diabetic polyneuropathy: Secondary | ICD-10-CM | POA: Diagnosis present

## 2023-05-20 DIAGNOSIS — M79672 Pain in left foot: Secondary | ICD-10-CM | POA: Insufficient documentation

## 2023-05-20 DIAGNOSIS — Z79891 Long term (current) use of opiate analgesic: Secondary | ICD-10-CM | POA: Insufficient documentation

## 2023-05-20 MED ORDER — HYDROMORPHONE HCL 4 MG PO TABS: 4.0000 mg | ORAL_TABLET | Freq: Three times a day (TID) | ORAL | 0 refills | Status: DC | PRN

## 2023-05-20 NOTE — Progress Notes (Signed)
Nursing Pain Medication Assessment:  Safety precautions to be maintained throughout the outpatient stay will include: orient to surroundings, keep bed in low position, maintain call bell within reach at all times, provide assistance with transfer out of bed and ambulation.  Medication Inspection Compliance: [name] did not comply with our request to bring his pills to be counted. He was reminded that bringing the medication bottles, even when empty, is a requirement.  Medication: None brought in. Pill/Patch Count: None available to be counted. Bottle Appearance: No container available. Did not bring bottle(s) to appointment. Filled Date: N/A Last Medication intake:  Today

## 2023-05-20 NOTE — Patient Instructions (Addendum)
______________________________________________________________________    Opioid Pain Medication Update  To: All patients taking opioid pain medications. (I.e.: hydrocodone, hydromorphone, oxycodone, oxymorphone, morphine, codeine, methadone, tapentadol, tramadol, buprenorphine, fentanyl, etc.)  Re: Updated review of side effects and adverse reactions of opioid analgesics, as well as new information about long term effects of this class of medications.  Direct risks of long-term opioid therapy are not limited to opioid addiction and overdose. Potential medical risks include serious fractures, breathing problems during sleep, hyperalgesia, immunosuppression, chronic constipation, bowel obstruction, myocardial infarction, and tooth decay secondary to xerostomia.  Unpredictable adverse effects that can occur even if you take your medication correctly: Cognitive impairment, respiratory depression, and death. Most people think that if they take their medication "correctly", and "as instructed", that they will be safe. Nothing could be farther from the truth. In reality, a significant amount of recorded deaths associated with the use of opioids has occurred in individuals that had taken the medication for a long time, and were taking their medication correctly. The following are examples of how this can happen: Patient taking his/her medication for a long time, as instructed, without any side effects, is given a certain antibiotic or another unrelated medication, which in turn triggers a "Drug-to-drug interaction" leading to disorientation, cognitive impairment, impaired reflexes, respiratory depression or an untoward event leading to serious bodily harm or injury, including death.  Patient taking his/her medication for a long time, as instructed, without any side effects, develops an acute impairment of liver and/or kidney function. This will lead to a rapid inability of the body to breakdown and eliminate  their pain medication, which will result in effects similar to an "overdose", but with the same medicine and dose that they had always taken. This again may lead to disorientation, cognitive impairment, impaired reflexes, respiratory depression or an untoward event leading to serious bodily harm or injury, including death.  A similar problem will occur with patients as they grow older and their liver and kidney function begins to decrease as part of the aging process.  Background information: Historically, the original case for using long-term opioid therapy to treat chronic noncancer pain was based on safety assumptions that subsequent experience has called into question. In 1996, the American Pain Society and the American Academy of Pain Medicine issued a consensus statement supporting long-term opioid therapy. This statement acknowledged the dangers of opioid prescribing but concluded that the risk for addiction was low; respiratory depression induced by opioids was short-lived, occurred mainly in opioid-naive patients, and was antagonized by pain; tolerance was not a common problem; and efforts to control diversion should not constrain opioid prescribing. This has now proven to be wrong. Experience regarding the risks for opioid addiction, misuse, and overdose in community practice has failed to support these assumptions.  According to the Centers for Disease Control and Prevention, fatal overdoses involving opioid analgesics have increased sharply over the past decade. Currently, more than 96,700 people die from drug overdoses every year. Opioids are a factor in 7 out of every 10 overdose deaths. Deaths from drug overdose have surpassed motor vehicle accidents as the leading cause of death for individuals between the ages of 39 and 75.  Clinical data suggest that neuroendocrine dysfunction may be very common in both men and women, potentially causing hypogonadism, erectile dysfunction, infertility,  decreased libido, osteoporosis, and depression. Recent studies linked higher opioid dose to increased opioid-related mortality. Controlled observational studies reported that long-term opioid therapy may be associated with increased risk for cardiovascular events. Subsequent  meta-analysis concluded that the safety of long-term opioid therapy in elderly patients has not been proven.   Side Effects and adverse reactions: Common side effects: Drowsiness (sedation). Dizziness. Nausea and vomiting. Constipation. Physical dependence -- Dependence often manifests with withdrawal symptoms when opioids are discontinued or decreased. Tolerance -- As you take repeated doses of opioids, you require increased medication to experience the same effect of pain relief. Respiratory depression -- This can occur in healthy people, especially with higher doses. However, people with COPD, asthma or other lung conditions may be even more susceptible to fatal respiratory impairment.  Uncommon side effects: An increased sensitivity to feeling pain and extreme response to pain (hyperalgesia). Chronic use of opioids can lead to this. Delayed gastric emptying (the process by which the contents of your stomach are moved into your small intestine). Muscle rigidity. Immune system and hormonal dysfunction. Quick, involuntary muscle jerks (myoclonus). Arrhythmia. Itchy skin (pruritus). Dry mouth (xerostomia).  Long-term side effects: Chronic constipation. Sleep-disordered breathing (SDB). Increased risk of bone fractures. Hypothalamic-pituitary-adrenal dysregulation. Increased risk of overdose.  RISKS: Respiratory depression and death: Opioids increase the risk of respiratory depression and death.  Drug-to-drug interactions: Opioids are relatively contraindicated in combination with benzodiazepines, sleep inducers, and other central nervous system depressants. Other classes of medications (i.e.: certain antibiotics  and even over-the-counter medications) may also trigger or induce respiratory depression in some patients.  Medical conditions: Patients with pre-existing respiratory problems are at higher risk of respiratory failure and/or depression when in combination with opioid analgesics. Opioids are relatively contraindicated in some medical conditions such as central sleep apnea.   Fractures and Falls:  Opioids increase the risk and incidence of falls. This is of particular importance in elderly patients.  Endocrine System:  Long-term administration is associated with endocrine abnormalities (endocrinopathies). (Also known as Opioid-induced Endocrinopathy) Influences on both the hypothalamic-pituitary-adrenal axis?and the hypothalamic-pituitary-gonadal axis have been demonstrated with consequent hypogonadism and adrenal insufficiency in both sexes. Hypogonadism and decreased levels of dehydroepiandrosterone sulfate have been reported in men and women. Endocrine effects include: Amenorrhoea in women (abnormal absence of menstruation) Reduced libido in both sexes Decreased sexual function Erectile dysfunction in men Hypogonadisms (decreased testicular function with shrinkage of testicles) Infertility Depression and fatigue Loss of muscle mass Anxiety Depression Immune suppression Hyperalgesia Weight gain Anemia Osteoporosis Patients (particularly women of childbearing age) should avoid opioids. There is insufficient evidence to recommend routine monitoring of asymptomatic patients taking opioids in the long-term for hormonal deficiencies.  Immune System: Human studies have demonstrated that opioids have an immunomodulating effect. These effects are mediated via opioid receptors both on immune effector cells and in the central nervous system. Opioids have been demonstrated to have adverse effects on antimicrobial response and anti-tumour surveillance. Buprenorphine has been demonstrated to have  no impact on immune function.  Opioid Induced Hyperalgesia: Human studies have demonstrated that prolonged use of opioids can lead to a state of abnormal pain sensitivity, sometimes called opioid induced hyperalgesia (OIH). Opioid induced hyperalgesia is not usually seen in the absence of tolerance to opioid analgesia. Clinically, hyperalgesia may be diagnosed if the patient on long-term opioid therapy presents with increased pain. This might be qualitatively and anatomically distinct from pain related to disease progression or to breakthrough pain resulting from development of opioid tolerance. Pain associated with hyperalgesia tends to be more diffuse than the pre-existing pain and less defined in quality. Management of opioid induced hyperalgesia requires opioid dose reduction.  Cancer: Chronic opioid therapy has been associated with an increased risk  of cancer among noncancer patients with chronic pain. This association was more evident in chronic strong opioid users. Chronic opioid consumption causes significant pathological changes in the small intestine and colon. Epidemiological studies have found that there is a link between opium dependence and initiation of gastrointestinal cancers. Cancer is the second leading cause of death after cardiovascular disease. Chronic use of opioids can cause multiple conditions such as GERD, immunosuppression and renal damage as well as carcinogenic effects, which are associated with the incidence of cancers.   Mortality: Long-term opioid use has been associated with increased mortality among patients with chronic non-cancer pain (CNCP).  Prescription of long-acting opioids for chronic noncancer pain was associated with a significantly increased risk of all-cause mortality, including deaths from causes other than overdose.  Reference: Von Korff M, Kolodny A, Deyo RA, Chou R. Long-term opioid therapy reconsidered. Ann Intern Med. 2011 Sep 6;155(5):325-8. doi:  10.7326/[id number]. PMID: 16109604; PMCID: [id number]. Randon Goldsmith, Hayward RA, Dunn KM, [place] KP. Risk of adverse events in patients prescribed long-term opioids: A cohort study in the [place] [facility] Research Datalink. Eur J Pain. 2019 May;23(5):908-922. doi: 10.1002/ejp.1357. Epub 2019 Jan 31. PMID: 78295621. Colameco S, Coren JS, Ciervo CA. Continuous opioid treatment for chronic noncancer pain: a time for moderation in prescribing. Postgrad Med. 2009 Jul;121(4):61-6. doi: 10.3810/pgm.2009.07.2032. PMID: 30865784. William Hamburger RN, Constantine SD, Blazina I, Cristopher Peru, Bougatsos C, Deyo RA. The effectiveness and risks of long-term opioid therapy for chronic pain: a systematic review for a [employer] of [employer] Pathways to [employer]. Ann Intern Med. 2015 Feb 17;162(4):276-86. doi: 10.7326/M14-2559. PMID: 69629528. Caryl Bis [facility], [place] DM. NCHS Data Brief No. 22. [place]: Centers for Disease Control and Prevention; 2009. Sep, Increase in Fatal Poisonings Involving Opioid Analgesics in the [place], 1999-2006. Song IA, Choi HR, Oh TK. Long-term opioid use and mortality in patients with chronic non-cancer pain: Ten-year follow-up study in [place] from 2010 through 2019. EClinicalMedicine. 2022 Jul 18;51:101558. doi: 10.1016/j.eclinm.2022.413244. PMID: 01027253; PMCID: [id number]. Huser, W., Schubert, T., Vogelmann, T. et al. All-cause mortality in patients with long-term opioid therapy compared with non-opioid analgesics for chronic non-cancer pain: a database study. BMC Med 18, 162 (2020). http://lester.info/ Rashidian H, Karie Kirks, Malekzadeh R, Haghdoost AA. An Ecological Study of the Association between Opiate Use and Incidence of Cancers. Addict Health. 2016 Fall;8(4):252-260. PMID: 59563875; PMCID: [id number].  Our Goal: Our goal is to control your pain with means other  than the use of opioid pain medications.  Our Recommendation: Talk to your physician about coming off of these medications. We can assist you with the tapering down and stopping these medicines. Based on the new information, even if you cannot completely stop the medication, a decrease in the dose may be associated with a lesser risk. Ask for other means of controlling the pain. Decrease or eliminate those factors that significantly contribute to your pain such as smoking, obesity, and a diet heavily tilted towards "inflammatory" nutrients.  Last Updated: 01/27/2023   ______________________________________________________________________       ______________________________________________________________________    National Pain Medication Shortage  The [place] is experiencing worsening drug shortages. These have had a negative widespread effect on patient care and treatment. Not expected to improve any time soon. Predicted to last past 2029.   Drug shortage list (generic names) Oxycodone IR Oxycodone/APAP Oxymorphone IR Hydromorphone Hydrocodone/APAP Morphine  Where is the problem?  Manufacturing and supply level.  Will this shortage  affect you?  Only if you take any of the above pain medications.  How? You may be unable to fill your prescription.  Your pharmacist may offer a "partial fill" of your prescription. (Warning: Do not accept partial fills.) Prescriptions partially filled cannot be transferred to another pharmacy. Read our Medication Rules and Regulation. Depending on how much medicine you are dependent on, you may experience withdrawals when unable to get the medication.  Recommendations: Consider ending your dependence on opioid pain medications. Ask your pain specialist to assist you with the process. Consider switching to a medication currently not in shortage, such as Buprenorphine. Talk to your pain specialist about this option. Consider decreasing your pain  medication requirements by managing tolerance thru "Drug Holidays". This may help minimize withdrawals, should you run out of medicine. Control your pain thru the use of non-pharmacological interventional therapies.   Your prescriber: Prescribers cannot be blamed for shortages. Medication manufacturing and supply issues cannot be fixed by the prescriber.   NOTE: The prescriber is not responsible for supplying the medication, or solving supply issues. Work with your pharmacist to solve it. The patient is responsible for the decision to take or continue taking the medication and for identifying and securing a legal supply source. By law, supplying the medication is the job and responsibility of the pharmacy. The prescriber is responsible for the evaluation, monitoring, and prescribing of these medications.   Prescribers will NOT: Re-issue prescriptions that have been partially filled. Re-issue prescriptions already sent to a pharmacy.  Re-send prescriptions to a different pharmacy because yours did not have your medication. Ask pharmacist to order more medicine or transfer the prescription to another pharmacy. (Read below.)  New 2023 regulation: "March 22, 2022 Revised Regulation Allows DEA-Registered Pharmacies to Transfer Electronic Prescriptions at a Patient's Request DEA Headquarters Division - Public Information Office Patients now have the ability to request their electronic prescription be transferred to another pharmacy without having to go back to their practitioner to initiate the request. This revised regulation went into effect on Monday, March 18, 2022.     At a patient's request, a DEA-registered retail pharmacy can now transfer an electronic prescription for a controlled substance (schedules II-V) to another DEA-registered retail pharmacy. Prior to this change, patients would have to go through their practitioner to cancel their prescription and have it re-issued to a different  pharmacy. The process was taxing and time consuming for both patients and practitioners.    The Drug Enforcement Administration [facility]) published its intent to revise the process for transferring electronic prescriptions on June 09, 2020.  The final rule was published in the federal register on February 14, 2022 and went into effect 30 days later.  Under the final rule, a prescription can only be transferred once between pharmacies, and only if allowed under existing state or other applicable law. The prescription must remain in its electronic form; may not be altered in any way; and the transfer must be communicated directly between two licensed pharmacists. It's important to note, any authorized refills transfer with the original prescription, which means the entire prescription will be filled at the same pharmacy".  Reference: [email or web address].is [facility] website announcement)  [email or web address] [employer] of [employer])   [employer] / Vol. 88, No. 143 / Thursday, February 14, 2022 / Rules and Regulations DEPARTMENT OF JUSTICE  Drug Enforcement Administration  21 CFR Part 1306  [Docket No. [id number]]  RIN [id number] Transfer of Electronic Prescriptions for Schedules II-V Controlled Substances Between  Pharmacies for Initial Filling  ______________________________________________________________________       ______________________________________________________________________    Transfer of Pain Medication between Pharmacies  Re: 2023 DEA Clarification on existing regulation  Published on DEA Website: March 22, 2022  Title: Revised Regulation Allows DEA-Registered Pharmacies to Electrical engineer Prescriptions at a Patient's Request DEA Headquarters Division - [employer]  "Patients now have the ability to  request their electronic prescription be transferred to another pharmacy without having to go back to their practitioner to initiate the request. This revised regulation went into effect on Monday, March 18, 2022.     At a patient's request, a DEA-registered retail pharmacy can now transfer an electronic prescription for a controlled substance (schedules II-V) to another DEA-registered retail pharmacy. Prior to this change, patients would have to go through their practitioner to cancel their prescription and have it re-issued to a different pharmacy. The process was taxing and time consuming for both patients and practitioners.    The Drug Enforcement Administration [facility]) published its intent to revise the process for transferring electronic prescriptions on June 09, 2020.  The final rule was published in the federal register on February 14, 2022 and went into effect 30 days later.  Under the final rule, a prescription can only be transferred once between pharmacies, and only if allowed under existing state or other applicable law. The prescription must remain in its electronic form; may not be altered in any way; and the transfer must be communicated directly between two licensed pharmacists. It's important to note, any authorized refills transfer with the original prescription, which means the entire prescription will be filled at the same pharmacy."    REFERENCES: 1. DEA website announcement [email or web address]  2. Department of Justice website  [email or web address].at.pdf  3. DEPARTMENT OF JUSTICE Drug Enforcement Administration 21 CFR Part 1306 [Docket No. [id number]] RIN [id number] "Transfer of Electronic Prescriptions for Schedules II-V Controlled Substances Between Pharmacies for Initial  Filling"  ______________________________________________________________________       ______________________________________________________________________    Medication Rules  Purpose: To inform patients, and their family members, of our medication rules and regulations.  Applies to: All patients receiving prescriptions from our practice (written or electronic).  Pharmacy of record: This is the pharmacy where your electronic prescriptions will be sent. Make sure we have the correct one.  Electronic prescriptions: In compliance with the [facility] Strengthen Opioid Misuse Prevention (STOP) Act of 2017 (Session Conni Elliot [phone]), effective July 22, 2018, all controlled substances must be electronically prescribed. Written prescriptions, faxing, or calling prescriptions to a pharmacy will no longer be done.  Prescription refills: These will be provided only during in-person appointments. No medications will be renewed without a "face-to-face" evaluation with your provider. Applies to all prescriptions.  NOTE: The following applies primarily to controlled substances (Opioid* Pain Medications).   Type of encounter (visit): For patients receiving controlled substances, face-to-face visits are required. (Not an option and not up to the patient.)  Patient's Responsibilities: Pain Pills: Bring all pain pills to every appointment (except for procedure appointments). Pill counts are required.  Pill Bottles: Bring pills in original pharmacy bottle. Bring bottle, even if empty. Always bring the bottle of the most recent fill.  Medication refills: You are responsible for knowing and keeping track of what medications you are taking and when is it that you will need a refill. The day before your appointment: write a list of all prescriptions that need to be refilled. The day of the appointment: give the list to  the admitting nurse. Prescriptions will be written only during appointments. No  prescriptions will be written on procedure days. If you forget a medication: it will not be "Called in", "Faxed", or "electronically sent". You will need to get another appointment to get these prescribed. No early refills. Do not call asking to have your prescription filled early. Partial  or short prescriptions: Occasionally your pharmacy may not have enough pills to fill your prescription.  NEVER ACCEPT a partial fill or a prescription that is short of the total amount of pills that you were prescribed.  With controlled substances the law allows 72 hours for the pharmacy to complete the prescription.  If the prescription is not completed within 72 hours, the pharmacist will require a new prescription to be written. This means that you will be short on your medicine and we WILL NOT send another prescription to complete your original prescription.  Instead, request the pharmacy to send a carrier to a nearby branch to get enough medication to provide you with your full prescription. Prescription Accuracy: You are responsible for carefully inspecting your prescriptions before leaving our office. Have the discharge nurse carefully go over each prescription with you, before taking them home. Make sure that your name is accurately spelled, that your address is correct. Check the name and dose of your medication to make sure it is accurate. Check the number of pills, and the written instructions to make sure they are clear and accurate. Make sure that you are given enough medication to last until your next medication refill appointment. Taking Medication: Take medication as prescribed. When it comes to controlled substances, taking less pills or less frequently than prescribed is permitted and encouraged. Never take more pills than instructed. Never take the medication more frequently than prescribed.  Inform other Doctors: Always inform, all of your healthcare providers, of all the medications you take. Pain  Medication from other Providers: You are not allowed to accept any additional pain medication from any other Doctor or Healthcare provider. There are two exceptions to this rule. (see below) In the event that you require additional pain medication, you are responsible for notifying us, as stated below. Cough Medicine: Often these contain an opioid, such as codeine or hydrocodone. Never accept or take cough medicine containing these opioids if you are already taking an opioid* medication. The combination may cause respiratory failure and death. Medication Agreement: You are responsible for carefully reading and following our Medication Agreement. This must be signed before receiving any prescriptions from our practice. Safely store a copy of your signed Agreement. Violations to the Agreement will result in no further prescriptions. (Additional copies of our Medication Agreement are available upon request.) Laws, Rules, & Regulations: All patients are expected to follow all [address] and [employer], Rules, [employer]. Ignorance of the Laws does not constitute a valid excuse.  Illegal drugs and Controlled Substances: The use of illegal substances (including, but not limited to marijuana and its derivatives) and/or the illegal use of any controlled substances is strictly prohibited. Violation of this rule may result in the immediate and permanent discontinuation of any and all prescriptions being written by our practice. The use of any illegal substances is prohibited. Adopted CDC guidelines & recommendations: Target dosing levels will be at or below 60 MME/day. Use of benzodiazepines** is not recommended. Urine Drug testing: Patients taking controlled substances will be required to provide a urine sample upon request. Do not void before coming to your medication management appointments.  Hold emptying your bladder until you are admitted. The admitting nurse will inform you if a sample is required. Our  practice reserves the right to call you at any time to provide a sample. Once receiving the call, you have 24 hours to comply with request. Not providing a sample upon request may result in termination of medication therapy.  Exceptions: There are only two exceptions to the rule of not receiving pain medications from other Healthcare Providers. Exception #1 (Emergencies): In the event of an emergency (i.e.: accident requiring emergency care), you are allowed to receive additional pain medication. However, you are responsible for: As soon as you are able, call our office [phone], at any time of the day or night, and leave a message stating your name, the date and nature of the emergency, and the name and dose of the medication prescribed. In the event that your call is answered by a member of our staff, make sure to document and save the date, time, and the name of the person that took your information.  Exception #2 (Planned Surgery): In the event that you are scheduled by another doctor or dentist to have any type of surgery or procedure, you are allowed (for a period no longer than 30 days), to receive additional pain medication, for the acute post-op pain. However, in this case, you are responsible for picking up a copy of our "Post-op Pain Management for Surgeons" handout, and giving it to your surgeon or dentist. This document is available at our office, and does not require an appointment to obtain it. Simply go to our office during business hours (Monday-Thursday from 8:00 AM to 4:00 PM) (Friday 8:00 AM to 12:00 Noon) or if you have a scheduled appointment with [place], prior to your surgery, and ask for it by name. In addition, you are responsible for: calling our office [phone], at any time of the day or night, and leaving a message stating your name, name of your surgeon, type of surgery, and date of procedure or surgery. Failure to comply with your responsibilities may result in termination  of therapy involving the controlled substances.  Consequences:  Non-compliance with the above rules may result in permanent discontinuation of medication prescription therapy. All patients receiving any type of controlled substance is expected to comply with the above patient responsibilities. Not doing so may result in permanent discontinuation of medication prescription therapy. Medication Agreement Violation. Following the above rules, including your responsibilities will help you in avoiding a Medication Agreement Violation ("Breaking your Pain Medication Contract").  *Opioid medications include: morphine, codeine, oxycodone, oxymorphone, hydrocodone, hydromorphone, meperidine, tramadol, tapentadol, buprenorphine, fentanyl, methadone. **Benzodiazepine medications include: diazepam (Valium), alprazolam (Xanax), clonazepam (Klonopine), lorazepam (Ativan), clorazepate (Tranxene), chlordiazepoxide (Librium), estazolam (Prosom), oxazepam (Serax), temazepam (Restoril), triazolam (Halcion) (Last updated: 05/14/2023) ______________________________________________________________________      ______________________________________________________________________    Medication Recommendations and Reminders  Applies to: All patients receiving prescriptions (written and/or electronic).  Medication Rules & Regulations: You are responsible for reading, knowing, and following our "Medication Rules" document. These exist for your safety and that of others. They are not flexible and neither are we. Dismissing or ignoring them is an act of "non-compliance" that may result in complete and irreversible termination of such medication therapy. For safety reasons, "non-compliance" will not be tolerated. As with the [place] fundamental legal principle of "ignorance of the law is no defense", we will accept no excuses for not having read and knowing the content of documents provided to you by  our practice.  Pharmacy of  record:  Definition: This is the pharmacy where your electronic prescriptions will be sent.  We do not endorse any particular pharmacy. It is up to you and your insurance to decide what pharmacy to use.  We do not restrict you in your choice of pharmacy. However, once we write for your prescriptions, we will NOT be re-sending more prescriptions to fix restricted supply problems created by your pharmacy, or your insurance.  The pharmacy listed in the electronic medical record should be the one where you want electronic prescriptions to be sent. If you choose to change pharmacy, simply notify our nursing staff. Changes will be made only during your regular appointments and not over the phone.  Recommendations: Keep all of your pain medications in a safe place, under lock and key, even if you live alone. We will NOT replace lost, stolen, or damaged medication. We do not accept "Police Reports" as proof of medications having been stolen. After you fill your prescription, take 1 week's worth of pills and put them away in a safe place. You should keep a separate, properly labeled bottle for this purpose. The remainder should be kept in the original bottle. Use this as your primary supply, until it runs out. Once it's gone, then you know that you have 1 week's worth of medicine, and it is time to come in for a prescription refill. If you do this correctly, it is unlikely that you will ever run out of medicine. To make sure that the above recommendation works, it is very important that you make sure your medication refill appointments are scheduled at least 1 week before you run out of medicine. To do this in an effective manner, make sure that you do not leave the office without scheduling your next medication management appointment. Always ask the nursing staff to show you in your prescription , when your medication will be running out. Then arrange for the receptionist to get you a return appointment, at least  7 days before you run out of medicine. Do not wait until you have 1 or 2 pills left, to come in. This is very poor planning and does not take into consideration that we may need to cancel appointments due to bad weather, sickness, or emergencies affecting our staff. DO NOT ACCEPT A "Partial Fill": If for any reason your pharmacy does not have enough pills/tablets to completely fill or refill your prescription, do not allow for a "partial fill". The law allows the pharmacy to complete that prescription within 72 hours, without requiring a new prescription. If they do not fill the rest of your prescription within those 72 hours, you will need a separate prescription to fill the remaining amount, which we will NOT provide. If the reason for the partial fill is your insurance, you will need to talk to the pharmacist about payment alternatives for the remaining tablets, but again, DO NOT ACCEPT A PARTIAL FILL, unless you can trust your pharmacist to obtain the remainder of the pills within 72 hours.  Prescription refills and/or changes in medication(s):  Prescription refills, and/or changes in dose or medication, will be conducted only during scheduled medication management appointments. (Applies to both, written and electronic prescriptions.) No refills on procedure days. No medication will be changed or started on procedure days. No changes, adjustments, and/or refills will be conducted on a procedure day. Doing so will interfere with the diagnostic portion of the procedure. No phone refills. No medications will be "  called into the pharmacy". No Fax refills. No weekend refills. No Holliday refills. No after hours refills.  Remember:  Business hours are:  Monday to Thursday 8:00 AM to 4:00 PM Provider's Schedule: Delano Metz, MD - Appointments are:  Medication management: Monday and Wednesday 8:00 AM to 4:00 PM Procedure day: Tuesday and Thursday 7:30 AM to 4:00 PM Edward Jolly, MD -  Appointments are:  Medication management: Tuesday and Thursday 8:00 AM to 4:00 PM Procedure day: Monday and Wednesday 7:30 AM to 4:00 PM (Last update: 05/14/2022) ______________________________________________________________________      ______________________________________________________________________     Naloxone Nasal Spray  Why am I receiving this medication? [place] STOP ACT requires that all patients taking high dose opioids or at risk of opioids respiratory depression, be prescribed an opioid reversal agent, such as Naloxone (AKA: Narcan).  What is this medication? NALOXONE (nal OX one) treats opioid overdose, which causes slow or shallow breathing, severe drowsiness, or trouble staying awake. Call emergency services after using this medication. You may need additional treatment. Naloxone works by reversing the effects of opioids. It belongs to a group of medications called opioid blockers.  COMMON BRAND NAME(S): Kloxxado, Narcan  What should I tell my care team before I take this medication? They need to know if you have any of these conditions: Heart disease Substance use disorder An unusual or allergic reaction to naloxone, other medications, foods, dyes, or preservatives Pregnant or trying to get pregnant Breast-feeding  When to use this medication? This medication is to be used for the treatment of respiratory depression (less than 8 breaths per minute) secondary to opioid overdose.   How to use this medication? This medication is for use in the nose. Lay the person on their back. Support their neck with your hand and allow the head to tilt back before giving the medication. The nasal spray should be given into 1 nostril. After giving the medication, move the person onto their side. Do not remove or test the nasal spray until ready to use. Get emergency medical help right away after giving the first dose of this medication, even if the person wakes up. You  should be familiar with how to recognize the signs and symptoms of a narcotic overdose. If more doses are needed, give the additional dose in the other nostril. Talk to your care team about the use of this medication in children. While this medication may be prescribed for children as young as newborns for selected conditions, precautions do apply.  Naloxone Overdosage: If you think you have taken too much of this medicine contact a poison control center or emergency room at once.  NOTE: This medicine is only for you. Do not share this medicine with others.  What if I miss a dose? This does not apply.  What may interact with this medication? This is only used during an emergency. No interactions are expected during emergency use. This list may not describe all possible interactions. Give your health care provider a list of all the medicines, herbs, non-prescription drugs, or dietary supplements you use. Also tell them if you smoke, drink alcohol, or use illegal drugs. Some items may interact with your medicine.  What should I watch for while using this medication? Keep this medication ready for use in the case of an opioid overdose. Make sure that you have the phone number of your care team and local hospital ready. You may need to have additional doses of this medication. Each nasal spray contains  a single dose. Some emergencies may require additional doses. After use, bring the treated person to the nearest hospital or call 911. Make sure the treating care team knows that the person has received a dose of this medication. You will receive additional instructions on what to do during and after use of this medication before an emergency occurs.  What side effects may I notice from receiving this medication? Side effects that you should report to your care team as soon as possible: Allergic reactions--skin rash, itching, hives, swelling of the face, lips, tongue, or throat Side effects that  usually do not require medical attention (report these to your care team if they continue or are bothersome): Constipation Dryness or irritation inside the nose Headache Increase in blood pressure Muscle spasms Stuffy nose Toothache This list may not describe all possible side effects. Call your doctor for medical advice about side effects. You may report side effects to FDA at 1-800-FDA-1088.  Where should I keep my medication? Because this is an emergency medication, you should keep it with you at all times.  Keep out of the reach of children and pets. Store between 20 and 25 degrees C (68 and 77 degrees F). Do not freeze. Throw away any unused medication after the expiration date. Keep in original box until ready to use.  NOTE: This sheet is a summary. It may not cover all possible information. If you have questions about this medicine, talk to your doctor, pharmacist, or health care provider.   2023 Elsevier/Gold Standard (2021-03-16 00:00:00)  ______________________________________________________________________    Capsaicin Patches What is this medication? CAPSAICIN (cap SAY sin) relieves minor pain in your muscles and joints. It works by making your skin feel warm or cool, which blocks pain signals going to the brain. This medicine may be used for other purposes; ask your health care provider or pharmacist if you have questions. COMMON BRAND NAME(S): Qutenza What should I tell my care team before I take this medication? They need to know if you have any of these conditions: Broken or irritated skin High blood pressure History of heart attack or stroke An unusual or allergic reaction to capsaicin, hot peppers, other medications, foods, dyes, or preservatives Pregnant or trying to get pregnant Breast-feeding How should I use this medication? This medication is for external use only. It is applied by your care team in a hospital or clinic setting. Talk to your care team  about the use of this medication in children. Special care may be needed. Overdosage: If you think you have taken too much of this medicine contact a poison control center or emergency room at once. NOTE: This medicine is only for you. Do not share this medicine with others. What if I miss a dose? This does not apply. What may interact with this medication? Interactions are not expected. Do not use any other skin products on the affected area without asking your care team. This list may not describe all possible interactions. Give your health care provider a list of all the medicines, herbs, non-prescription drugs, or dietary supplements you use. Also tell them if you smoke, drink alcohol, or use illegal drugs. Some items may interact with your medicine. What should I watch for while using this medication? Your condition will be monitored carefully while you are receiving this medication. Your blood pressure may go up during the procedure. Do not touch the medication patch during treatment. This medication causes red, burning skin. You may need pain medication for during  and after the procedure. This medication can make you more sensitive to heat for a few days after treatment. Be careful in hot showers or baths. Keep out of the sun. Exercise may make the treated skin feel hotter. Tell your care team if your symptoms do not start to get better or if they get worse. What side effects may I notice from receiving this medication? Side effects that you should report to your care team as soon as possible: Allergic reactions--skin rash, itching, hives, swelling of the face, lips, tongue, or throat Side effects that usually do not require medical attention (report these to your care team if they continue or are bothersome): Mild skin irritation, redness, or dryness This list may not describe all possible side effects. Call your doctor for medical advice about side effects. You may report side effects to  FDA at 1-800-FDA-1088. Where should I keep my medication? This medication is given in a hospital or clinic. It will not be stored at home. NOTE: This sheet is a summary. It may not cover all possible information. If you have questions about this medicine, talk to your doctor, pharmacist, or health care provider.  2024 Elsevier/Gold Standard (2021-05-08 00:00:00)   ______________________________________________________________________    OTC Supplements:   The following is a list of over-the-counter (OTC) supplements that have been found to have [employer]) studies suggesting that they may be of some benefits when used in moderation in some chronic pain-related conditions.  NOTE:  Always consult with your primary care provider and/or pharmacist before taking any OTC medications to make sure they will not interact with your current medications. Always use manufacturer's recommended dosage.  Supplement Possible benefit May be of benefit in treatment of  Active ingredient(s)  Turmeric/curcumin anti-inflammatory Joint and muscle aches and pain associated with arthritis and inflammation The main active ingredient in turmeric is curcumin.  Glucosamine/chondroitin (triple strength) may slow loss of articular cartilage Osteoarthritis glucosamine HCl and chondroitin sulfate  Vitamin D-3* may suppress release of chemicals associated with inflammation Joint and muscle aches and pain associated with arthritis and inflammation. It is known that vitamin D has a beneficial effect on the modulation of the immune system components responsible for the inflammatory process. Vitamin D3 may be an alternative supplementary treatment for chronic arthritis.  CHOLECALCIFEROL; ALPHA.-TOCOPHEROL, D  Moringa(+) anti-inflammatory with mild analgesic effects Joint and muscle aches and pain associated with arthritis and inflammation Many:  Phenolic acids: Gallic acid, ellagic acid, ferulic acid,  caffeic acid, o-coumaric acid, and chlorogenic acid. Flavonoids: Rutin, quercetin, rhamnetin, kaempferol, apigenin, and myricetin. Terpenes: Lutein, all-E-luteoxanthin, 13-Z-lutein, 15-Z-?-carotene, and all-E-zeaxanthin. Alkaloids: Marumoside A, marumoside B, and pyrrolemarumine-4?-O-?-L-rhamnopyranoside. Vitamins: Vitamin A and vitamin C. Protein: Milk protein  Melatonin(+) Helps reset sleep cycle. Insomnia. May also be helpful in neurodegenerative disorders melatonin, also known as N-acetyl-5-methoxytryptamine  Vitamin B-12* may help keep nerves and blood cells healthy as well as maintaining function of nervous system Neuropathies. Nerve pain (Burning pain) methylcobalamin and 5-deoxyadenosylcobalamin  Alpha-Lipoic-Acid (ALA)* antioxidant that may help with nerve health, pain, and blocking the activation of some inflammatory chemicals Diabetic neuropathy and metabolic syndrome Alpha-lipoic acid (LA), or 1,2-dithiolane-3-pentanoic acid  superoxide dismutase (SOD)** Currently being reviewed.  Superoxide dismutase (SOD); Copper-zinc superoxide dismutase  Tiger Balm Currently being reviewed.  Camphor; Menthol; Capsicum Extract (Capsicin); Methyl Salicylate; Essential Oils (Cassia Oil, Cajuput Oil, Clove Oil, and Dementholized Mint Oil)  hydrolyzed collagen peptides* Currently being reviewed. Possible chondroprotective effects. May help with protection of joint health. Possible  skin protective effects. Collagen supplementation may increases bone strength, density, and mass; may improve joint stiffness/mobility, and functionality; and may reduce joint pain. Hydrolyzed collagen peptides caused diminished proinflammatory cytokine levels, an effect synergized significantly by the simultaneous treatment with vitamin D3. Vit D3 may help prevent tolerance to collagen supplementation.  Methylsulfonylmethane (MSM)* Currently being reviewed.    CBD(+) Currently being reviewed.    Delta-8 THC(+) Currently being reviewed.     *  Generally Recognized As Safe (GRAS) approved substance.-FDA ([employer]) ** "Possibly Safe", but not considered Generally Recognized As Safe (Not GRAS) by the [place] (FDA) as a food additive. (+) Not considered Generally Recognized As Safe (Not GRAS) by the [employer] (FDA) as a food additive.  ______________________________________________________________________

## 2023-05-20 NOTE — Progress Notes (Signed)
PROVIDER NOTE: Information contained herein reflects review and annotations entered in association with encounter. Interpretation of such information and data should be left to medically-trained personnel. Information provided to patient can be located elsewhere in the medical record under "Patient Instructions". Document created using STT-dictation technology, any transcriptional errors that may result from process are unintentional.    Patient: [name]  Service Category: E/M  Provider: Oswaldo Done, MD  DOB: 25-May-1946  DOS: 05/20/2023  Referring Provider: Lynnea Ferrier, MD  MRN: [id number]  Specialty: Interventional Pain Management  PCP: Lynnea Ferrier, MD  Type: Established Patient  Setting: Ambulatory outpatient    Location: Office  Delivery: Face-to-face     HPI  [name], a 77 y.o. year old male, is here today because of his Chronic pain syndrome [G89.4]. [name] primary complain today is Back Pain (lower)  Pertinent problems: 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); Chronic ankle pain (Bilateral); Pain in joint, ankle and foot; Polyneuropathy associated with underlying disease (HCC); Ankle sprain; Rotator cuff sprain; Lumbosacral radiculopathy at S1; Chronic feet pain (3ry area of Pain) (Bilateral) (R>L); Chronic knee pain (4th area of Pain) (Bilateral) (R>L); Failed back surgical syndrome; Lower extremity weakness (Bilateral); Chronic neuropathic pain; Peripheral neurogenic pain; Nonfamilial nocturnal leg cramps (Bilateral); Restless leg syndrome; Pain in right knee; DDD (degenerative disc disease), lumbosacral; Abnormal MRI, lumbar spine (02/12/2022); 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; Lumbar facet arthropathy (Multilevel) (Bilateral); Lumbosacral facet syndrome (Bilateral); Lumbar foraminal narrowing (Multilevel) (Bilateral); Cervicalgia; Pain of both shoulder joints; Radicular pain of shoulder; Occipital neuralgia (Right); Cervico-occipital neuralgia (Right); Cervical facet hypertrophy (Multilevel) (Bilateral); Cervical facet pain; Painful cervical range of motion; Chronic neurogenic pain; Left rib fracture; Chronic painful diabetic neuropathy (HCC); Foot pain; Lumbago; 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); Decreased range of motion of lumbar spine; Lumbar paraspinal muscle spasm; Spondylosis without myelopathy or radiculopathy, lumbosacral region; Chronic low back pain (Bilateral) w/ sciatica (Left); Chronic lower extremity pain (Left); Lumbosacral radiculopathy at L5; DDD (degenerative disc disease), cervical; Abnormal MRI, cervical spine (10/15/2022); Lumbar facet joint pain; Central spinal stenosis (L3-4, L4-5); Lumbar lateral recess (subarticular zone) stenosis (Left: L2-3) (Bilateral: L3-4, L4-5) (L>R); Hypertrophy of ligamentum flavum (L3-4); Lumbar intervertebral disc displacement (IVDD); Lumbar nerve root impingement (Left: L3 at L2-3 & L3-4) (Right: L3 at L3-4); Diabetic generalized sensorimotor polyneuropathy (HCC); Chronic low back pain (Bilateral) w/o sciatica; Low back pain of over 3 months duration; and Lumbar facet joint syndrome on their pertinent problem list. Pain Assessment: Severity of Chronic pain is reported as a 10-Worst pain ever/10. Location: Back Lower/backs of both legs. Onset: More than a month ago. Quality:  (excruciating). Timing: Constant. Modifying factor(s): nothing. Vitals:  height is 6\' 1"  (1.854 m) and weight is 198 lb (89.8 kg). His temporal temperature is 97.5 F (36.4 C) (abnormal). His blood pressure is 132/74 and his pulse is 69. His respiration is 16 and oxygen saturation is 94%.  BMI: Estimated  body mass index is 26.12 kg/m as calculated from the following:   Height as of this encounter: 6\' 1"  (1.854 m).   Weight as of this encounter: 198 lb (89.8 kg). Last encounter: 04/09/2023. Last procedure: 05/08/2023.  Reason for encounter: both,  medication management and post-procedure evaluation and assessment.  The patient indicates doing well with the current medication regimen. No adverse reactions or side effects reported to the medications.  Unfortunately, the patient did not bring his pills to be counted and therefore will be given him only 30 day refill.  In addition the patient indicates 100% relief of the pain for the first hour after the procedure and then return of the pain thereafter.  This is likely to have been obtained from the IV sedation rather than the procedure itself and therefore I do not think that he would be a good candidate for radiofrequency ablation.  Today we had a very long conversation where the patient had several questions, all of which I have answered to the best of my abilities.  His main concern is the low back pain and how he continues to feel as if it is getting worse.  However, the patient continues on the opioids and I have reminded him that part of the problem is that he may be again developing tolerance to these opioids.  He is currently on hydromorphone 4 mg, which is a pretty strong medication to start with and he refers "confessing" that he will sometimes take 2 tablets at a time to try to bring the pain down.  I have reminded him that if he takes more medication than prescribed, I will not be refilling the medication early.  In addition, the patient states that they picked up a "partial prescription" because that her pharmacy did not have enough medication to complete the full prescription.  This brought up another topic and I have reminded them that they need to carefully read the information that I have been providing them regarding the medications.  I have  informed them that according to the paperwork that I have being given them for quite some time, they are not supposed to pick up any "partial prescriptions".  I have also informed him of what they need to be doing whenever they encountered a situation where there is not enough medication available in the pharmacy.  In any case, because he did not really get any benefit from the diagnostic injections, I do not think that his primary area of pain is that of the facet joint.  It would seem that the pain seems to be originating from higher up in the spinal axis.  I have explained to them the reasoning behind that.  Unfortunately, today the patient did not bring his pills and therefore I have sent only a 30-day prescription to the pharmacy.  If they do decide to take advantage of the benefits of doing a "Drug Holiday", then we will consider breaking his hydromorphone into the 2 mg tablets so that it will be easier for him to do the downward taper and not encounter any significant problems with withdrawals.  If when he comes off of the opioids his pain increases significantly, we can always do a steroid taper to assist in bringing some of that down.  RTCB: 09/12/2023   Post-procedure evaluation   Procedure: Lumbar Facet, Medial Branch Block(s) #2  Laterality: Bilateral  Level: L2, L3, L4, L5, and S1 Medial Branch Level(s). Injecting these levels blocks the L3-4, L4-5, and L5-S1 lumbar facet joints. Imaging: Fluoroscopic guidance Spinal ([id number]) Anesthesia: Local anesthesia (1-2% Lidocaine) Anxiolysis: IV Versed 2.0 mg Sedation:   Fentanyl 1 mL (50 mcg) DOS: 05/08/2023 Performed by: Oswaldo Done, MD  Primary Purpose: Diagnostic/Therapeutic Indications: Low back pain severe enough to  impact quality of life or function. 1. 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 Low back pain of over 3 months duration   3. Lumbar facet joint pain   4. Lumbar facet joint syndrome   5. Lumbar facet  arthropathy (Multilevel) (Bilateral)   6. 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 Lumbosacral facet syndrome (Bilateral)   8. Spondylosis without myelopathy or radiculopathy, lumbosacral region   9. 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 Degeneration of intervertebral disc of lumbosacral region with discogenic back pain    NAS-11 Pain score:   Pre-procedure: 10-Worst pain ever/10   Post-procedure: 0-No pain/10      Effectiveness:  Initial hour after procedure: 100 %. Subsequent 4-6 hours post-procedure: 0 %. Analgesia past initial 6 hours: 0 %. Ongoing improvement:  Analgesic: No benefit. Function: No benefit ROM: No benefit  Pharmacotherapy Assessment  Analgesic: Hydromorphone 4 mg tablet, 1 tab p.o. 3 times daily MME/day: 60 mg/day    Monitoring: [place] PMP: PDMP reviewed during this encounter.       Pharmacotherapy: No side-effects or adverse reactions reported. Compliance: No problems identified. Effectiveness: Clinically acceptable.  Gabriel Elk, RN  05/20/2023 12:49 PM  Sign when Signing Visit Nursing Pain Medication Assessment:  Safety precautions to be maintained throughout the outpatient stay will include: orient to surroundings, keep bed in low position, maintain call bell within reach at all times, provide assistance with transfer out of bed and ambulation.  Medication Inspection Compliance: [name] did not comply with our request to bring his pills to be counted. He was reminded that bringing the medication bottles, even when empty, is a requirement.  Medication: None brought in. Pill/Patch Count: None available to be counted. Bottle Appearance: No container available. Did not bring bottle(s) to appointment. Filled Date: N/A Last Medication intake:  Today    No results found for: "CBDTHCR" No results found for: "D8THCCBX" No results found for: "D9THCCBX"  UDS:  Summary  Date Value Ref Range Status  10/14/2022 Note  Final     Comment:    ==================================================================== [employer] Select 13 (MW) ==================================================================== Test                             Result       Flag       Units  Drug Present and Declared for Prescription Verification   Hydromorphone                  3914         EXPECTED   ng/mg creat    Hydromorphone may be administered as a scheduled prescription    medication; it is also an expected metabolite of hydrocodone.  ==================================================================== Test                      Result    Flag   Units      Ref Range   Creatinine              84               mg/dL      >=33 ==================================================================== Declared Medications:  The flagging and interpretation on this report are based on the  following declared medications.  Unexpected results may arise from  inaccuracies in the declared medications.   **Note: The testing scope of this panel includes these medications:   Hydromorphone (Dilaudid)   **  Note: The testing scope of this panel does not include the  following reported medications:   Albuterol (Ventolin HFA)  Amitriptyline (Elavil)  Apixaban (Eliquis)  Aspirin  Azelastine (Astelin)  Bisacodyl  Clonidine (Catapres)  Duloxetine (Cymbalta)  Esomeprazole (Nexium)  Latanoprost (Xalatan)  Levothyroxine (Synthroid)  Loratadine (Claritin)  Losartan (Cozaar)  Mexiletine  Naloxone (Narcan)  Pregabalin (Lyrica)  Promethazine (Phenergan)  Simvastatin (Zocor)  Tizanidine (Zanaflex) ==================================================================== For clinical consultation, please call [phone]. ====================================================================       ROS  Constitutional: Denies any fever or chills Gastrointestinal: No reported hemesis, hematochezia, vomiting, or acute GI distress Musculoskeletal:  Denies any acute onset joint swelling, redness, loss of ROM, or weakness Neurological: No reported episodes of acute onset apraxia, aphasia, dysarthria, agnosia, amnesia, paralysis, loss of coordination, or loss of consciousness  Medication Review  DULoxetine, HYDROmorphone, albuterol, amitriptyline, apixaban, aspirin EC, azelastine, bisacodyl, cloNIDine, levothyroxine, loratadine, losartan, naloxone, pregabalin, and simvastatin  History Review  Allergy: [name] is allergic to celebrex [celecoxib], codeine, penicillins, and entresto [sacubitril-valsartan]. Drug: [name]  reports no history of drug use. Alcohol:  reports no history of alcohol use. Tobacco:  reports that he has been smoking cigarettes. He has a 44.3 pack-year smoking history. He has never used smokeless tobacco. Social: [name]  reports that he has been smoking cigarettes. He has a 44.3 pack-year smoking history. He has never used smokeless tobacco. He reports that he does not drink alcohol and does not use drugs. Medical:  has a past medical history of Acute anterolateral wall MI (HCC) (2000), Acute non-Q wave non-ST elevation myocardial infarction (NSTEMI) (HCC) (12/03/2004), Adenomatous polyp of colon, Agent orange exposure, Angina pectoris (HCC), Anxiety, Aortic atherosclerosis (HCC), Asthma, Bladder stones, Bradycardia, CHF (congestive heart failure) (HCC), Chronic lower back pain, Chronic pain syndrome, COPD (chronic obstructive pulmonary disease) (HCC), Coronary artery disease, Depression, DJD (degenerative joint disease), DVT of lower extremity, bilateral (HCC), Dyspnea, GERD (gastroesophageal reflux disease), History of hiatal hernia, HLD (hyperlipidemia), Hypertension, Hypothyroidism, Inflammatory polyarthropathy (HCC), Internal hemorrhoids, Long term prescription opiate use, 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 Surgical: [name]  has a past surgical history that includes Coronary angioplasty; Posterior lumbar fusion (N/A, 1999); Nissen fundoplication (N/A, 2000); Mastoidectomy; Partial colectomy (N/A); Intrathecal pump implantation (N/A, 03/2008); Intrathecal pump revision (N/A, 09/28/2014); Transurethral resection of bladder tumor (N/A, 03/07/2015); Colonoscopy with propofol (N/A, 02/27/2018); External ear surgery; Skin cancer excision; IVC FILTER PLACEMENT ([facility] HX) (N/A, 09/2007); IVC FILTER REMOVAL (N/A, 2011); Colonoscopy (N/A, 11/22/2013); Colonoscopy (N/A, 09/19/2008); Colonoscopy (N/A, 08/25/2003); Colonoscopy (N/A, 10/15/1994); Posterior laminectomy / decompression cervical spine (N/A, 1984); Posterior laminectomy / decompression cervical spine (N/A, 1985); Tonsillectomy (Bilateral, 1958); Coronary angioplasty with stent (Left, 12/04/2004); LEFT HEART CATH AND CORONARY ANGIOGRAPHY (Left, 02/19/2008); LEFT HEART CATH AND CORONARY ANGIOGRAPHY (Left, 04/18/1987); LEFT HEART CATH AND CORONARY ANGIOGRAPHY (Left, 02/03/2002); and Pain pump removal (N/A, 12/20/2021). Family: family history includes Emphysema in his father and sister; Heart attack in his father; Heart disease in his brother and mother.  Laboratory Chemistry Profile   Renal Lab Results  Component Value Date   BUN 10 01/30/2022   CREATININE 1.00 03/12/2023   BCR 11 01/30/2022   GFRAA >60  03/02/2015   GFRNONAA >60 11/01/2021    Hepatic Lab Results  Component Value Date   AST 31 01/30/2022   ALBUMIN 4.0 01/30/2022   ALKPHOS 96 01/30/2022    Electrolytes Lab Results   Component Value Date   NA 145 (H) 01/30/2022   K 5.2 01/30/2022   CL 104 01/30/2022   CALCIUM 9.6 01/30/2022   MG 2.3 11/01/2021   PHOS 3.7 11/01/2021    Bone Lab Results  Component Value Date   25OHVITD1 22 (L) 02/07/2021   25OHVITD2 <1.0 02/07/2021   25OHVITD3 22 02/07/2021    Inflammation (CRP: Acute Phase) (ESR: Chronic Phase) Lab Results  Component Value Date   CRP 5 01/30/2022   ESRSEDRATE 11 01/30/2022   LATICACIDVEN 0.9 10/28/2021         Note: Above Lab results reviewed.  Recent Imaging Review  DG PAIN CLINIC C-ARM 1-60 MIN NO REPORT Fluoro was used, but no Radiologist interpretation will be provided.  Please refer to "NOTES" tab for provider progress note. Note: Reviewed        Physical Exam  General appearance: Well nourished, well developed, and well hydrated. In no apparent acute distress Mental status: Alert, oriented x 3 (person, place, & time)       Respiratory: No evidence of acute respiratory distress Eyes: PERLA Vitals: BP 132/74   Pulse 69   Temp (!) 97.5 F (36.4 C) (Temporal)   Resp 16   Ht 6\' 1"  (1.854 m)   Wt 198 lb (89.8 kg)   SpO2 94%   BMI 26.12 kg/m  BMI: Estimated body mass index is 26.12 kg/m as calculated from the following:   Height as of this encounter: 6\' 1"  (1.854 m).   Weight as of this encounter: 198 lb (89.8 kg). Ideal: Ideal body weight: 79.9 kg (176 lb 2.4 oz) Adjusted ideal body weight: 83.9 kg (184 lb 14.2 oz)  Assessment   Diagnosis Status  1. Chronic pain syndrome   2. Lumbar post-laminectomy syndrome (L4-5, L5-S1)   3. 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 Chronic feet pain (3ry area of Pain) (Bilateral) (R>L)   6. Chronic knee pain (4th area of Pain) (Bilateral) (R>L)   7. Failed back surgical syndrome   8. Pharmacologic therapy   9. Chronic use of opiate for therapeutic purpose   10.  Encounter for medication management   11. Encounter for chronic pain management   12. Diabetic generalized sensorimotor polyneuropathy (HCC)   13. Polyneuropathy associated with underlying disease (HCC)   14. Chronic painful diabetic neuropathy (HCC)    Persistent Persistent Persistent   Updated Problems: No problems updated.  Plan of Care  Problem-specific:  No problem-specific Assessment & Plan notes found for this encounter.  [name] has a current medication list which includes the following long-term medication(s): albuterol, amitriptyline, azelastine, duloxetine, eliquis, [START ON 06/14/2023] hydromorphone, levothyroxine, loratadine, losartan, simvastatin, and pregabalin.  Pharmacotherapy (Medications Ordered): Meds ordered this encounter  Medications   HYDROmorphone (DILAUDID) 4 MG tablet    Sig: Take 1 tablet (4 mg total) by mouth every 8 (eight) hours as needed. Must last 30 days.    Dispense:  90 tablet    Refill:  0    DO NOT: delete (not duplicate); no partial-fill (will deny script to complete), no refill request (F/U required). DISPENSE: 1 day early if closed  on fill date. WARN: No CNS-depressants within 8 hrs of med.   Orders:  Orders Placed This Encounter  Procedures   NEUROLYSIS    Please order Qutenza patches    Standing Status:   Future    Standing Expiration Date:   08/20/2023    Order Specific Question:   Where will this procedure be performed?    Answer:   [facility]   Follow-up plan:   Return in about 8 weeks (around 07/14/2023) for Proc-day (T,Th) (B) Qutenza #1 + (MM).      Interventional Therapies  Risk Factors  Complex Considerations:  Anticoagulation: Eliquis (Stop: 3 days  Restart: 6 hrs)  T2IDDM  CAD  CHF  COPD  Hx. Nephrolithiasis  A-Fib  HTN  IBS  Hx. NSTEMI MI  Hx DVT  Depression  Anxiety    Planned  Pending:   Therapeutic bilateral lower extremity Qutenza treatment #1    Under consideration:    Therapeutic/palliative left L4-5 LESI #1  Therapeutic left caudal ESI #2  Therapeutic bilateral lower extremity Qutenza treatment #1    Completed:   Diagnostic/therapeutic bilateral lumbar facet MBB x2 (05/08/2023) (100/0/0/0) Diagnostic left caudal ESI and epidurogram x1 (04/24/2021) (LBP: 50/50/0  LEP; 100/100/50-60)  Diagnostic intrathecal pump catheter test/evaluation x1 (06/21/2021) (Dx: Catheter malfunction/dysfunction)  Intrathecal catheter and pump removal by me (12/20/2021) (2ry to end of battery life & catheter malfunction)    Completed by other providers:   Diagnostic EMG/PNCV (LE) (05/22/2021) by Dr. Cristopher Peru [facility]) Dx: Electrodiagnostic evidence of generalized sensorimotor polyneuropathy.  Unable to rule out bilateral L5 chronic radiculopathies. Referral (02/07/2021 & 09/19/2021) to [facility] neurosurgery Lucy Chris, MD) for ITP replacement.  Cardiology clearance to Dr. Juliann Pares requested (09/19/2021)    Therapeutic  Palliative (PRN) options:   None established   Pharmacotherapy  Nonopioids transferred (03/20/2022): Pregabalin (Lyrica) 75 mg TID.       Recent Visits Date Type Provider Dept  05/08/23 Procedure visit Delano Metz, MD [facility]  04/09/23 Office Visit Delano Metz, MD [facility]  03/27/23 Procedure visit Delano Metz, MD [facility]  Showing recent visits within past 90 days and meeting all other requirements Today's Visits Date Type Provider Dept  05/20/23 Office Visit Delano Metz, MD [facility]  Showing today's visits and meeting all other requirements Future Appointments Date Type Provider Dept  07/10/23 Appointment Delano Metz, MD [facility]  Showing future appointments within next 90 days and meeting all other requirements  I discussed the assessment and treatment plan with the patient. The patient was provided an opportunity to ask questions and all were  answered. The patient agreed with the plan and demonstrated an understanding of the instructions.  Patient advised to call back or seek an in-person evaluation if the symptoms or condition worsens.  Duration of encounter: 30 minutes.  Total time on encounter, as per AMA guidelines included both the face-to-face and non-face-to-face time personally spent by the physician and/or other qualified health care professional(s) on the day of the encounter (includes time in activities that require the physician or other qualified health care professional and does not include time in activities normally performed by clinical staff). Physician's time may include the following activities when performed: Preparing to see the patient (e.g., pre-charting review of records, searching for previously ordered imaging, lab work, and nerve conduction tests) Review of prior analgesic pharmacotherapies. Reviewing PMP Interpreting ordered tests (e.g., lab work, imaging, nerve conduction tests) Performing post-procedure evaluations, including  interpretation of diagnostic procedures Obtaining and/or reviewing separately obtained history Performing a medically appropriate examination and/or evaluation Counseling and educating the patient/family/caregiver Ordering medications, tests, or procedures Referring and communicating with other health care professionals (when not separately reported) Documenting clinical information in the electronic or other health record Independently interpreting results (not separately reported) and communicating results to the patient/ family/caregiver Care coordination (not separately reported)  Note by: Oswaldo Done, MD Date: 05/20/2023; Time: 3:12 PM

## 2023-05-22 ENCOUNTER — Ambulatory Visit: Payer: Medicare (Managed Care) | Admitting: Pain Medicine

## 2023-05-30 ENCOUNTER — Encounter: Payer: Self-pay | Admitting: Neurology

## 2023-05-30 ENCOUNTER — Encounter: Payer: Medicare (Managed Care) | Admitting: Neurology

## 2023-05-30 DIAGNOSIS — Z029 Encounter for administrative examinations, unspecified: Secondary | ICD-10-CM

## 2023-06-09 ENCOUNTER — Ambulatory Visit: Payer: Medicare (Managed Care) | Admitting: Neurosurgery

## 2023-06-16 ENCOUNTER — Telehealth: Payer: Self-pay | Admitting: *Deleted

## 2023-06-16 ENCOUNTER — Other Ambulatory Visit: Payer: Self-pay | Admitting: *Deleted

## 2023-06-16 ENCOUNTER — Telehealth: Payer: Self-pay | Admitting: Pain Medicine

## 2023-06-16 DIAGNOSIS — Z79891 Long term (current) use of opiate analgesic: Secondary | ICD-10-CM

## 2023-06-16 DIAGNOSIS — G894 Chronic pain syndrome: Secondary | ICD-10-CM

## 2023-06-16 DIAGNOSIS — Z79899 Other long term (current) drug therapy: Secondary | ICD-10-CM

## 2023-06-16 DIAGNOSIS — M961 Postlaminectomy syndrome, not elsewhere classified: Secondary | ICD-10-CM

## 2023-06-16 DIAGNOSIS — G8929 Other chronic pain: Secondary | ICD-10-CM

## 2023-06-16 MED ORDER — HYDROMORPHONE HCL 4 MG PO TABS: 4.0000 mg | ORAL_TABLET | Freq: Three times a day (TID) | ORAL | 0 refills | Status: DC | PRN

## 2023-06-16 NOTE — Telephone Encounter (Signed)
Rx request sent to Dr. Laban Emperor.

## 2023-06-16 NOTE — Telephone Encounter (Signed)
Cancelled Rx for Hydromorphone at [facility].

## 2023-06-16 NOTE — Telephone Encounter (Signed)
Patient notified

## 2023-06-16 NOTE — Telephone Encounter (Signed)
PT current pharmacy is out of hydromorphone and will like for the prescription to be send to [facility]. Ty

## 2023-06-22 NOTE — Progress Notes (Unsigned)
Electrophysiology Office Follow up Visit Note:    Date:  06/23/2023   ID:  [name], DOB 1945/12/19, MRN [id number]  PCP:  Lynnea Ferrier, MD  [facility] Cardiologist:  None  [facility] Electrophysiologist:  Lanier Prude, MD    Interval History:     [name] is a 77 y.o. male who presents for a follow up visit.   I last saw the patient September 18, 2022.  The patient has a history of atrial fibrillation and PVCs.  He is not a candidate for invasive EP procedures.  He has previously been managed with mexiletine.  Has previously not tolerated beta-blocker.  Discussed the use of AI scribe software for clinical note transcription with the patient, who gave verbal consent to proceed.  History of Present Illness   The patient, with a history of heart rhythm issues, presents with a new symptom of whole body shaking, described as a sensation similar to sitting on an electric sander. This has been occurring for the past several nights, often waking him up. He is unsure if there is physical movement associated with the sensation. He also reports a concurrent headache in the frontal region. He denies any correlation between these symptoms and his heart rhythm, which has been stable and well-controlled.  In addition, the patient mentions he is due for dental extractions and is concerned about the implications of being on a blood thinner (Eliquis) during the procedure. He also reports running out of Eliquis and needing a refill.            Past medical, surgical, social and family history were reviewed.  ROS:   Please see the history of present illness.    All other systems reviewed and are negative.  EKGs/Labs/Other Studies Reviewed:    The following studies were reviewed today:     EKG Interpretation Date/Time:  Monday June 23 2023 15:02:19 EST Ventricular Rate:  84 PR Interval:  170 QRS Duration:  110 QT Interval:  394 QTC Calculation: 465 R  Axis:   -35  Text Interpretation: Normal sinus rhythm Left axis deviation Incomplete right bundle branch block Confirmed by Steffanie Dunn [phone]) on 06/23/2023 3:16:15 PM    Physical Exam:    VS:  BP 136/84   Pulse 61   Ht 6' 2.5" (1.892 m)   Wt 185 lb (83.9 kg)   SpO2 98%   BMI 23.43 kg/m     Wt Readings from Last 3 Encounters:  06/23/23 185 lb (83.9 kg)  05/20/23 198 lb (89.8 kg)  05/08/23 196 lb (88.9 kg)     Physical Exam   GENERAL: elderly male, no distress on the bed CHEST: lungs clear to auscultation CARDIOVASCULAR: heart rhythm regular rate and rhythm, no increased work or breathing          ASSESSMENT:    1. PVC's (premature ventricular contractions)   2. Chronic systolic heart failure (HCC)   3. Paroxysmal atrial fibrillation (HCC)   4. Encounter for long-term current use of high risk medication    PLAN:    In order of problems listed above:  Assessment and Plan  #PVCs Previously high burden.  Was previously prescribed mexiletine to attempt suppression. Has not tolerated beta-blocker in the past.  Not a candidate for invasive EP procedures.  For now, recommend optimization of his heart failure regimen. - cont mexiletine  #Chronic systolic heart failure NYHA class III.Warm and dry on exam. Continue current medication regimen  #Atrial  fibrillation Continue Eliquis for stroke prophylaxis   -Advised patient to stop Eliquis 2-3 days prior to dental extraction and restart post-procedure as advised by dentist.    Follow up 6 mo with EP APP.      Signed, Steffanie Dunn, MD, [facility], [facility] 06/23/2023 3:21 PM    Electrophysiology [facility]

## 2023-06-23 ENCOUNTER — Encounter: Payer: Self-pay | Admitting: Cardiology

## 2023-06-23 ENCOUNTER — Ambulatory Visit: Payer: Medicare (Managed Care) | Attending: Cardiology | Admitting: Cardiology

## 2023-06-23 VITALS — BP 136/84 | HR 61 | Ht 74.5 in | Wt 185.0 lb

## 2023-06-23 DIAGNOSIS — I5022 Chronic systolic (congestive) heart failure: Secondary | ICD-10-CM | POA: Diagnosis not present

## 2023-06-23 DIAGNOSIS — I493 Ventricular premature depolarization: Secondary | ICD-10-CM

## 2023-06-23 DIAGNOSIS — Z79899 Other long term (current) drug therapy: Secondary | ICD-10-CM

## 2023-06-23 DIAGNOSIS — I480 Paroxysmal atrial fibrillation: Secondary | ICD-10-CM

## 2023-06-23 NOTE — Patient Instructions (Signed)
 Medication Instructions:  Your physician recommends that you continue on your current medications as directed. Please refer to the Current Medication list given to you today.  *If you need a refill on your cardiac medications before your next appointment, please call your pharmacy*  Follow-Up: At [facility], you and your health needs are our priority.  As part of our continuing mission to provide you with exceptional heart care, we have created designated Provider Care Teams.  These Care Teams include your primary Cardiologist (physician) and Advanced Practice Providers (APPs -  Physician Assistants and Nurse Practitioners) who all work together to provide you with the care you need, when you need it.  Your next appointment:   6 month(s)  Provider:   You will see one of the following Advanced Practice Providers on your designated Care Team:   Francis Dowse, Charlott Holler [address] [place], [place] Sherie Don, NP Canary Brim, NP

## 2023-07-07 IMAGING — MR MR CERVICAL SPINE W/O CM
5 series · 36 of 48 positions shown · non-contrast
Comparison: Cervical spine CT 08/26/2019.

CLINICAL DATA: 76-year-old male with persistent neck pain. Fall 7
months ago.

EXAM:
MRI CERVICAL SPINE WITHOUT CONTRAST
TECHNIQUE: Multiplanar, multisequence MR imaging of the cervical spine was
performed. No intravenous contrast was administered.

[Series 5: T2 · sagittal · 3.0mm · 0.62mm/px · 6 of 15 slices shown (1 of 2)]
[im 1/15]
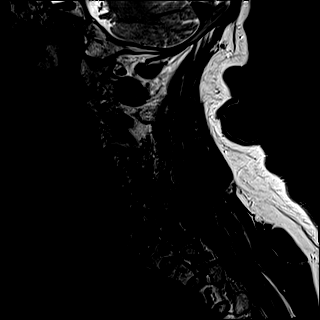
[im 3/15]
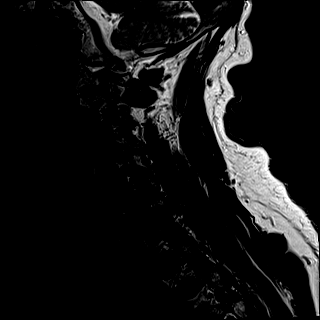
[im 6/15]
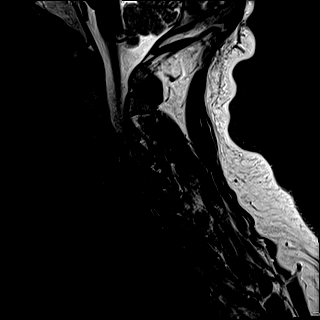
[im 9/15]
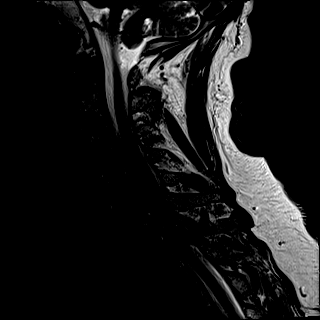
[im 12/15]
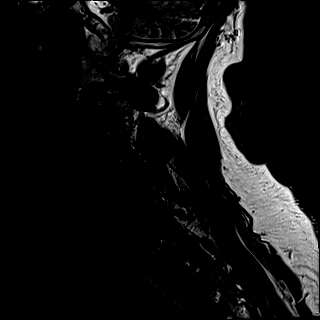
[im 15/15]
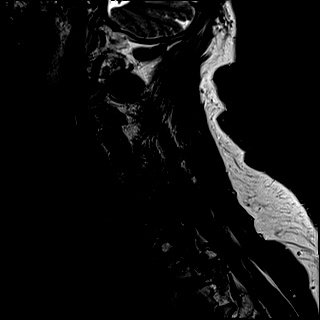

[Series 6: FLAIR · sagittal · 3.0mm · 0.78mm/px · 7 of 15 slices shown]
[im 1/15]
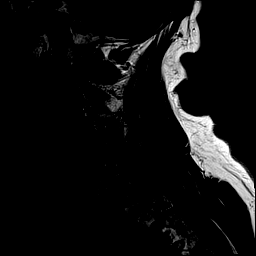
[im 3/15]
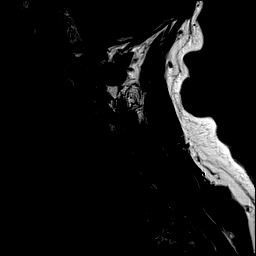
[im 5/15]
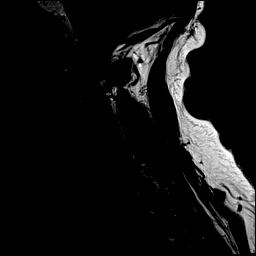
[im 8/15]
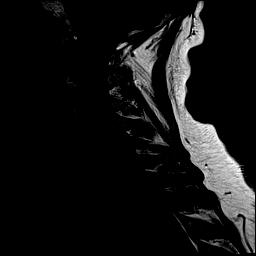
[im 10/15]
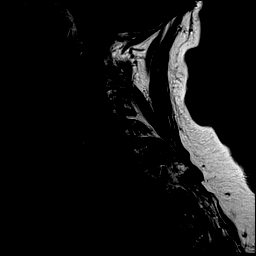
[im 12/15]
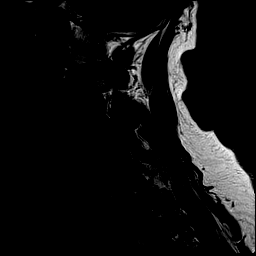
[im 15/15]
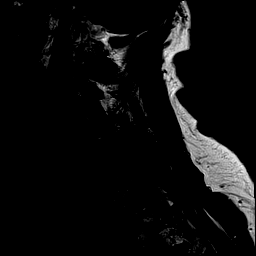

[Series 7: STIR · sagittal · 3.0mm · 0.62mm/px · 7 of 15 slices shown]
[im 1/15]
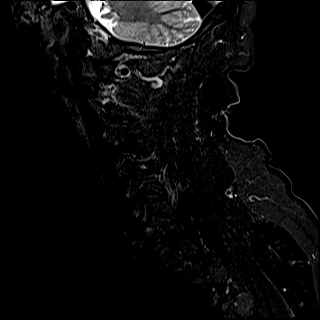
[im 3/15]
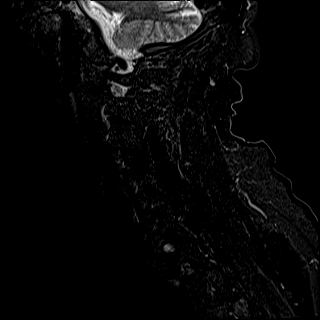
[im 5/15]
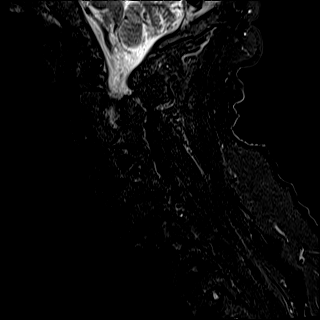
[im 8/15]
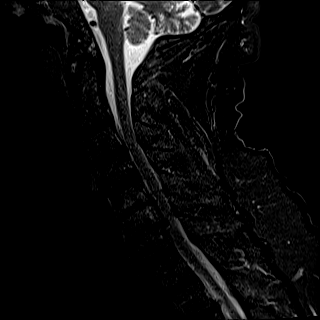
[im 10/15]
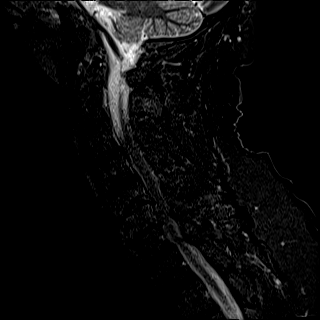
[im 12/15]
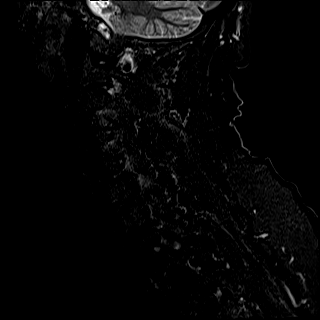
[im 15/15]
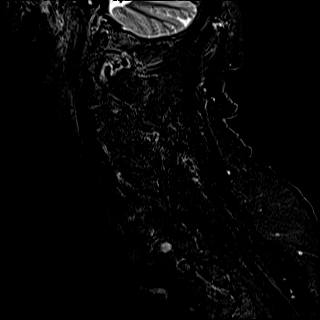

[Series 8: T2 · axial · 3.0mm · 0.70mm/px · z∈[-141,-43]mm · 8 of 32 slices shown (2 of 2)]
[im 1/32]
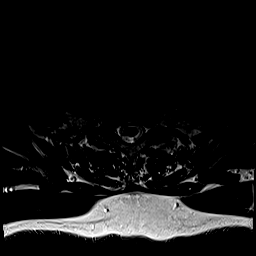
[im 5/32]
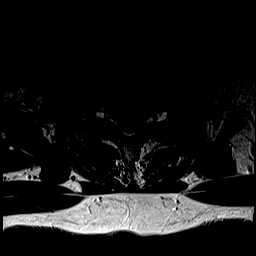
[im 10/32]
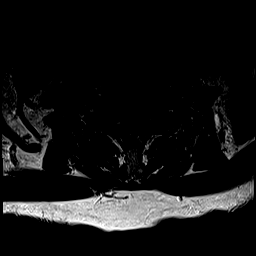
[im 15/32]
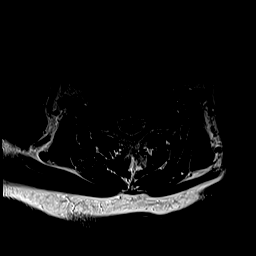
[im 17/32]
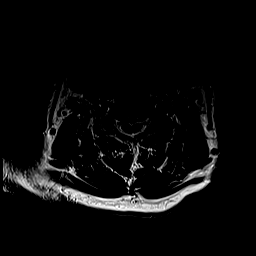
[im 22/32]
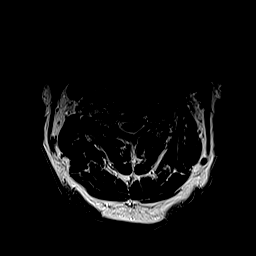
[im 27/32]
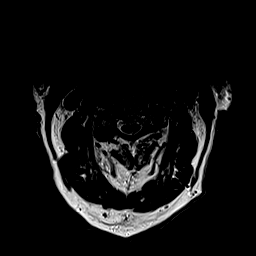
[im 32/32]
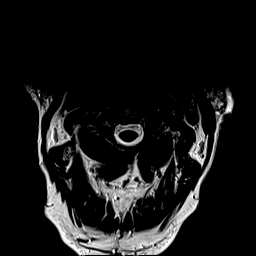

[Series 9: ax mpgr · axial · 3.0mm · 0.35mm/px · z∈[-141,-43]mm · 8 of 32 slices shown]
[im 1/32]
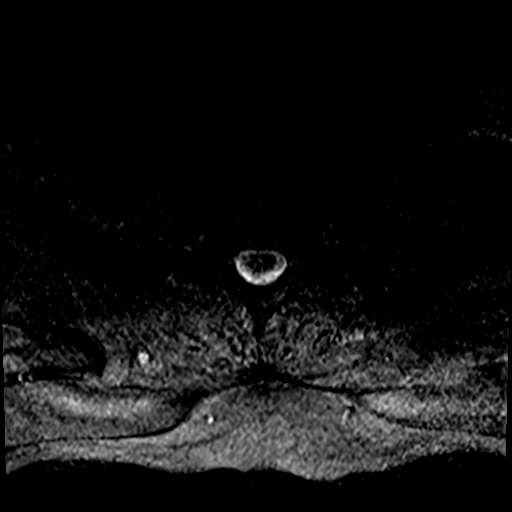
[im 5/32]
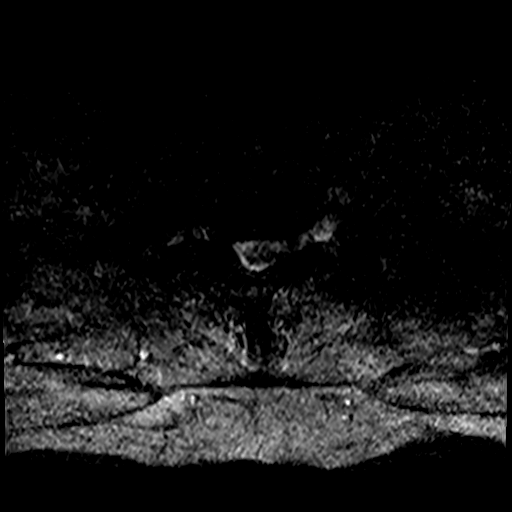
[im 10/32]
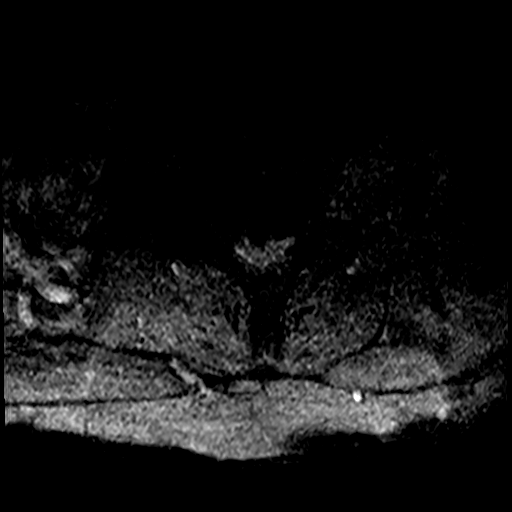
[im 15/32]
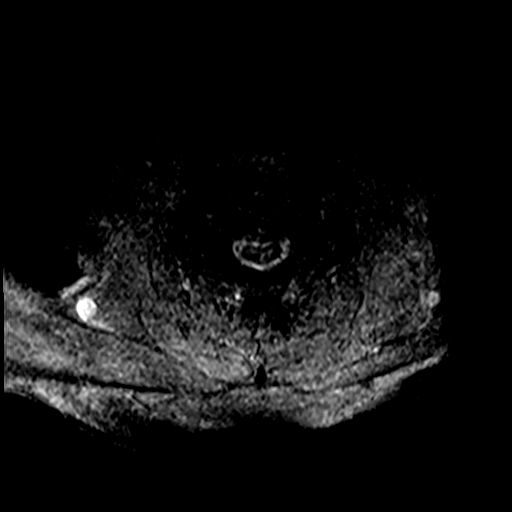
[im 17/32]
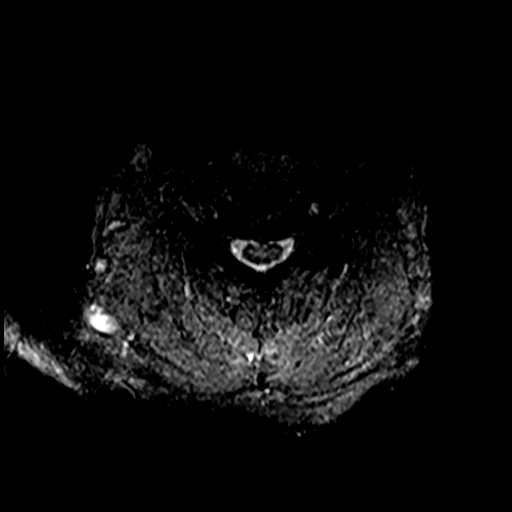
[im 22/32]
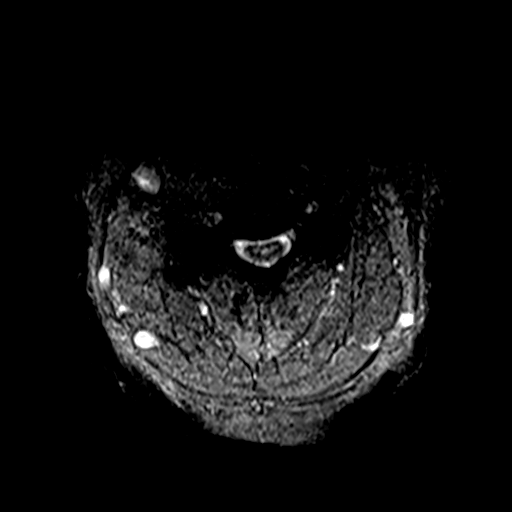
[im 27/32]
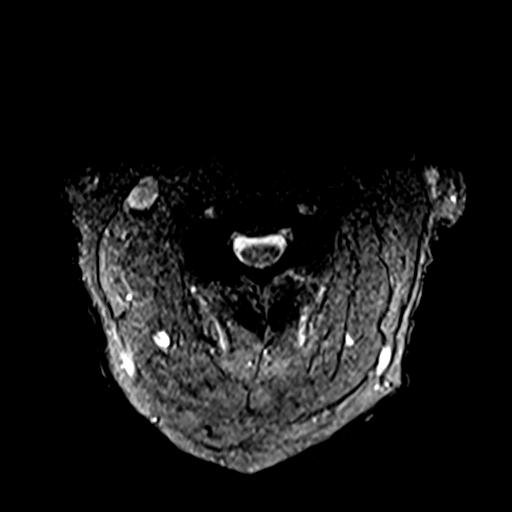
[im 32/32]
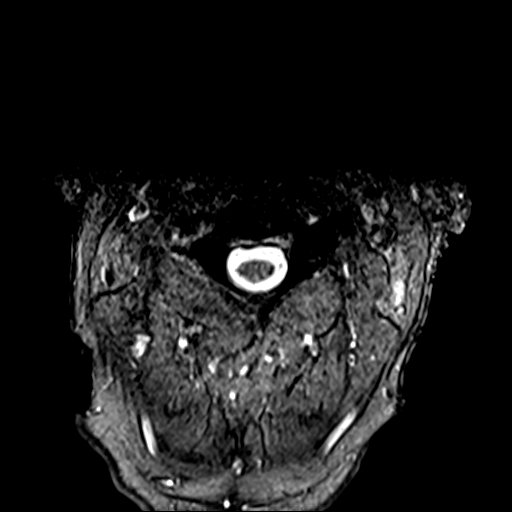

[36 of 48 positions shown; findings below may reference images not displayed]

FINDINGS: Alignment: Stable straightening of cervical lordosis and mild
spondylolisthesis at C3-C4 and C4-C5 since 1519.

Vertebrae: Degenerative marrow signal changes at C5 and C6
corresponding to sclerosis on the prior CT. No convincing marrow
edema or acute osseous abnormality. Normal background bone marrow
signal.

Cord: Multilevel degenerative cervical spinal cord mass effect but
no convincing cord signal abnormality.

Posterior Fossa, vertebral arteries, paraspinal tissues:
Cervicomedullary junction is within normal limits. Negative visible
posterior fossa. Preserved major vascular flow voids in the neck.
Negative visible neck soft tissues and lung apices.

Disc levels:

C2-C3: Mild to moderate facet hypertrophy greater on the right. Mild
posterior disc bulging. No spinal stenosis. Moderate left and mild
right C3 foraminal stenosis.

C3-C4: Mild anterolisthesis. Right eccentric circumferential disc
bulge and endplate spurring with moderate to severe right side facet
hypertrophy. Mild to moderate facet hypertrophy on the left. Mild
spinal stenosis and spinal cord mass effect. Moderate to severe
right C4 foraminal stenosis.

C4-C5: Mild anterolisthesis. Mild to moderate bilateral facet
hypertrophy. Mild disc bulging and endplate spurring. Mild spinal
stenosis. Mild if any cord mass effect. Moderate bilateral C5
foraminal stenosis.

C5-C6: Chronic disc space loss. Circumferential disc osteophyte
complex with a broad-based posterior component. Mild to moderate
facet and ligament flavum hypertrophy. Mild spinal stenosis and
spinal cord mass effect with severe left greater than right C6
foraminal stenosis.

C6-C7: Disc space loss with circumferential disc osteophyte complex.
Chronic right paracentral component on series 8, image 24. Moderate
spinal stenosis and spinal cord mass effect. Mild to moderate facet
and ligament flavum hypertrophy. Moderate to severe left and severe
right C7 foraminal stenosis.

C7-T1: Small posterior disc bulging or protrusion. Mild to moderate
facet and ligament flavum hypertrophy. Mild spinal stenosis. Mild C8
foraminal stenosis.

No visible upper thoracic spinal stenosis.
IMPRESSION: 1. Mild spondylolisthesis at C3-C4 and C4-C5 with Advanced facet
arthropathy. And advanced disc and endplate degeneration at C5-C6
and C6-C7.

2. Subsequent widespread cervical spinal stenosis C3-C4 through
C7-T1 - Moderate at C6-C7 and with moderate spinal cord mass effect
there. But no cord signal abnormality identified.

3. Severe neural foraminal stenosis at the right C4, bilateral C6
and bilateral C7 nerve levels.

## 2023-07-09 NOTE — Patient Instructions (Incomplete)

## 2023-07-09 NOTE — Progress Notes (Unsigned)
PROVIDER NOTE: Interpretation of information contained herein should be left to medically-trained personnel. Specific patient instructions are provided elsewhere under "Patient Instructions" section of medical record. This document was created in part using STT-dictation technology, any transcriptional errors that may result from this process are unintentional.  Patient: [name] Type: Established DOB: [birth year] MRN: [id number] PCP: Lynnea Ferrier, MD  Service: Procedure DOS: 07/10/2023 Setting: Ambulatory Location: Ambulatory outpatient facility Delivery: Face-to-face Provider: Oswaldo Done, MD Specialty: Interventional Pain Management Specialty designation: 09 Location: Outpatient facility Ref. Prov.: Lynnea Ferrier, MD       Interventional Therapy   Type: Qutenza Neurolysis #1  Laterality:  Bilateral Area treated: Feet Imaging Guidance: None Anesthesia/analgesia/anxiolysis/sedation: None required Medication (Right): Qutenza (capsaicin 8%) topical system Medication (Left): Qutenza (capsaicin 8%) topical system Date: 07/10/2023 Performed by: Oswaldo Done, MD Rationale (medical necessity): procedure needed and proper for the treatment of Mr. [facility] medical symptoms and needs. Indication: Painful diabetic peripheral neuralgia (DPN) (ICD-10-CM:E11.40) severe enough to impact quality of life or function. 1. Chronic painful diabetic neuropathy (HCC)   2. Chronic feet pain (3ry area of Pain) (Bilateral) (R>L)   3. Polyneuropathy associated with underlying disease (HCC)   4. Chronic ankle pain (Bilateral)   5. Chronic neurogenic pain   6. Chronic neuropathic pain    NAS-11 Pain score:   Pre-procedure: 9 /10   Post-procedure: 9 /10     Position / Prep / Materials:  Position: Supine  Materials: Qutenza Kit (4 patches)  H&P (Pre-op Assessment):  [name] is a 77 y.o. (year old), male patient, seen today for interventional treatment. He  has a past surgical  history that includes Coronary angioplasty; Posterior lumbar fusion (N/A, 1999); Nissen fundoplication (N/A, 2000); Mastoidectomy; Partial colectomy (N/A); Intrathecal pump implantation (N/A, 03/2008); Intrathecal pump revision (N/A, 09/28/2014); Transurethral resection of bladder tumor (N/A, 03/07/2015); Colonoscopy with propofol (N/A, 02/27/2018); External ear surgery; Skin cancer excision; IVC FILTER PLACEMENT ([facility] HX) (N/A, 09/2007); IVC FILTER REMOVAL (N/A, 2011); Colonoscopy (N/A, 11/22/2013); Colonoscopy (N/A, 09/19/2008); Colonoscopy (N/A, 08/25/2003); Colonoscopy (N/A, 10/15/1994); Posterior laminectomy / decompression cervical spine (N/A, 1984); Posterior laminectomy / decompression cervical spine (N/A, 1985); Tonsillectomy (Bilateral, 1958); Coronary angioplasty with stent (Left, 12/04/2004); LEFT HEART CATH AND CORONARY ANGIOGRAPHY (Left, 02/19/2008); LEFT HEART CATH AND CORONARY ANGIOGRAPHY (Left, 04/18/1987); LEFT HEART CATH AND CORONARY ANGIOGRAPHY (Left, 02/03/2002); and Pain pump removal (N/A, 12/20/2021). [name] has a current medication list which includes the following prescription(s): albuterol, amitriptyline, aspirin ec, azelastine, bisacodyl, clonidine, duloxetine, eliquis, [START ON 07/15/2023] hydromorphone, [START ON 08/14/2023] hydromorphone, [START ON 09/13/2023] hydromorphone, levothyroxine, loratadine, losartan, mexiletine, naloxone, simvastatin, and pregabalin, and the following Facility-Administered Medications: capsaicin topical system and capsaicin topical system. His primarily concern today is the Foot Pain, Back Pain, and Leg Pain  Initial Vital Signs:  Pulse/HCG Rate: 81  Temp: 98.8 F (37.1 C) Resp: 18 BP: 133/76 SpO2: 98 %  BMI: Estimated body mass index is 23.75 kg/m as calculated from the following:   Height as of this encounter: 6\' 2"  (1.88 m).   Weight as of this encounter: 185 lb (83.9 kg).  Risk Assessment: Allergies: Reviewed. He is allergic to celebrex  [celecoxib], codeine, penicillins, and entresto [sacubitril-valsartan].  Allergy Precautions: None required Coagulopathies: Reviewed. None identified.  Blood-thinner therapy: None at this time Active Infection(s): Reviewed. None identified. [name] is afebrile  Site Confirmation: Mr. Sorrento was asked to confirm the procedure and laterality before marking the site Procedure checklist: Completed Consent: Before the  procedure and under the influence of no sedative(s), amnesic(s), or anxiolytics, the patient was informed of the treatment options, risks and possible complications. To fulfill our ethical and legal obligations, as recommended by the American Medical Association's Code of Ethics, I have informed the patient of my clinical impression; the nature and purpose of the treatment or procedure; the risks, benefits, and possible complications of the intervention; the alternatives, including doing nothing; the risk(s) and benefit(s) of the alternative treatment(s) or procedure(s); and the risk(s) and benefit(s) of doing nothing. The patient was provided information about the general risks and possible complications associated with the procedure. These may include, but are not limited to: failure to achieve desired goals, infection, bleeding, organ or nerve damage, allergic reactions, paralysis, and death. In addition, the patient was informed of those risks and complications associated to the procedure, such as failure to decrease pain; infection; bleeding; organ or nerve damage with subsequent damage to sensory, motor, and/or autonomic systems, resulting in permanent pain, numbness, and/or weakness of one or several areas of the body; allergic reactions; (i.e.: anaphylactic reaction); and/or death. Furthermore, the patient was informed of those risks and complications associated with the medications. These include, but are not limited to: allergic reactions (i.e.: anaphylactic or anaphylactoid  reaction(s)); adrenal axis suppression; blood sugar elevation that in diabetics may result in ketoacidosis or comma; water retention that in patients with history of congestive heart failure may result in shortness of breath, pulmonary edema, and decompensation with resultant heart failure; weight gain; swelling or edema; medication-induced neural toxicity; particulate matter embolism and blood vessel occlusion with resultant organ, and/or nervous system infarction; and/or aseptic necrosis of one or more joints. Finally, the patient was informed that Medicine is not an exact science; therefore, there is also the possibility of unforeseen or unpredictable risks and/or possible complications that may result in a catastrophic outcome. The patient indicated having understood very clearly. We have given the patient no guarantees and we have made no promises. Enough time was given to the patient to ask questions, all of which were answered to the patient's satisfaction. Mr. Farar has indicated that he wanted to continue with the procedure. Attestation: I, the ordering provider, attest that I have discussed with the patient the benefits, risks, side-effects, alternatives, likelihood of achieving goals, and potential problems during recovery for the procedure that I have provided informed consent. Date  Time: 07/10/2023 12:51 PM  Pharmacotherapy Assessment  Analgesic: Hydromorphone 4 mg tablet, 1 tab p.o. 3 times daily MME/day: 60 mg/day    Monitoring: [place] PMP: PDMP reviewed during this encounter.       Pharmacotherapy: No side-effects or adverse reactions reported. Compliance: No problems identified. Effectiveness: Clinically acceptable.  Earlyne Iba, RN  07/10/2023 12:58 PM  Sign when Signing Visit Nursing Pain Medication Assessment:  Safety precautions to be maintained throughout the outpatient stay will include: orient to surroundings, keep bed in low position, maintain call bell within reach at all  times, provide assistance with transfer out of bed and ambulation.  Medication Inspection Compliance: Pill count conducted under aseptic conditions, in front of the patient. Neither the pills nor the bottle was removed from the patient's sight at any time. Once count was completed pills were immediately returned to the patient in their original bottle.  Medication: Hydromorphone (Dilaudid) Pill/Patch Count:  17 of 90 pills remain Pill/Patch Appearance: Markings consistent with prescribed medication Bottle Appearance: Standard pharmacy container. Clearly labeled. Filled Date: 22 / 25 / 2024 Last Medication intake:  Today  No results found for: "CBDTHCR" No results found for: "D8THCCBX" No results found for: "D9THCCBX"  UDS:  Summary  Date Value Ref Range Status  10/14/2022 Note  Final    Comment:    ==================================================================== [employer] Select 13 (MW) ==================================================================== Test                             Result       Flag       Units  Drug Present and Declared for Prescription Verification   Hydromorphone                  3914         EXPECTED   ng/mg creat    Hydromorphone may be administered as a scheduled prescription    medication; it is also an expected metabolite of hydrocodone.  ==================================================================== Test                      Result    Flag   Units      Ref Range   Creatinine              84               mg/dL      >=95 ==================================================================== Declared Medications:  The flagging and interpretation on this report are based on the  following declared medications.  Unexpected results may arise from  inaccuracies in the declared medications.   **Note: The testing scope of this panel includes these medications:   Hydromorphone (Dilaudid)   **Note: The testing scope of this panel does not  include the  following reported medications:   Albuterol (Ventolin HFA)  Amitriptyline (Elavil)  Apixaban (Eliquis)  Aspirin  Azelastine (Astelin)  Bisacodyl  Clonidine (Catapres)  Duloxetine (Cymbalta)  Esomeprazole (Nexium)  Latanoprost (Xalatan)  Levothyroxine (Synthroid)  Loratadine (Claritin)  Losartan (Cozaar)  Mexiletine  Naloxone (Narcan)  Pregabalin (Lyrica)  Promethazine (Phenergan)  Simvastatin (Zocor)  Tizanidine (Zanaflex) ==================================================================== For clinical consultation, please call [phone]. ====================================================================       Pre-Procedure Preparation:  Monitoring: As per clinic protocol. Respiration, ETCO2, SpO2, BP, heart rate and rhythm monitor placed and checked for adequate function Safety Precautions: Patient was assessed for positional comfort and pressure points before starting the procedure. Time-out: I initiated and conducted the "Time-out" before starting the procedure, as per protocol. The patient was asked to participate by confirming the accuracy of the "Time Out" information. Verification of the correct person, site, and procedure were performed and confirmed by me, the nursing staff, and the patient. "Time-out" conducted as per Joint Commission's Universal Protocol (UP.01.01.01). Time: 1317 Start Time: 1319 hrs.  Description/Narrative of Procedure:          Region: Distal lower extremities Target Area: Sensory peripheral nerves affected by diabetic peripheral neuropathy Site: Feet Approach: Percutaneous  No./Series: Not applicable  Type: Percutaneous  Purpose: Therapeutic  Region: Distal lower extremities  Start Time: 1319 hrs.  Description of the Procedure: Protocol guidelines were followed. The patient was assisted into a comfortable position.  Informed consent was obtained in the patient monitored in the usual manner.  All questions were  answered prior to the procedure.  They Qutenza patches were applied to the affected area and then covered with the wrap.  The Patient was kept under observation until the treatment was completed.  The patches were removed and the treated area was inspected.  Vitals:  07/10/23 1251  BP: 133/76  Pulse: 81  Resp: 18  Temp: 98.8 F (37.1 C)  TempSrc: Temporal  SpO2: 98%  Weight: 185 lb (83.9 kg)  Height: 6\' 2"  (1.88 m)     End Time: 1359 hrs.  Type of Imaging Technique: None used Indication(s): N/A Exposure Time: No patient exposure Contrast: None used. Fluoroscopic Guidance: N/A Ultrasound Guidance: N/A Interpretation: N/A  Post-operative Assessment:  Post-procedure Vital Signs:  Pulse/HCG Rate: 81  Temp: 98.8 F (37.1 C) Resp: 18 BP: 133/76 SpO2: 98 %  EBL: None  Complications: No immediate post-treatment complications observed by team, or reported by patient.  Note: The patient tolerated the entire procedure well. A repeat set of vitals were taken after the procedure and the patient was kept under observation following institutional policy, for this type of procedure. Post-procedural neurological assessment was performed, showing return to baseline, prior to discharge. The patient was provided with post-procedure discharge instructions, including a section on how to identify potential problems. Should any problems arise concerning this procedure, the patient was given instructions to immediately contact us, at any time, without hesitation. In any case, we plan to contact the patient by telephone for a follow-up status report regarding this interventional procedure.  Comments:  No additional relevant information.  Plan of Care (POC)  Orders:  Orders Placed This Encounter  Procedures   NEUROLYSIS    Where will this procedure be performed?:   [facility]   Informed Consent Details: Physician/Practitioner Attestation; Transcribe to consent form and obtain patient  signature    Nursing Order: Transcribe to consent form and obtain patient signature.    Physician/Practitioner attestation of informed consent for procedure/surgical case:   I, the physician/practitioner, attest that I have discussed with the patient the benefits, risks, side effects, alternatives, likelihood of achieving goals and potential problems during recovery for the procedure that I have provided informed consent.    Procedure:   Qutenza peripheral neurolysis    Physician/Practitioner performing the procedure:   Delano Metz, MD    Indication/Reason:   Painful Diabetic Peripheral Neuropathy   Chronic Opioid Analgesic:   Hydromorphone 4 mg tablet, 1 tab p.o. 3 times daily MME/day: 60 mg/day    Medications ordered for procedure: Meds ordered this encounter  Medications   capsaicin topical system 8 % patch 4 patch    Disregard order if transmitted to pharmacy. Qutenza Kit to be used from [facility].   capsaicin topical system 8 % patch 4 patch    Disregard order if transmitted to pharmacy. Qutenza Kit to be used from [facility].   HYDROmorphone (DILAUDID) 4 MG tablet    Sig: Take 1 tablet (4 mg total) by mouth every 8 (eight) hours as needed. Must last 30 days.    Dispense:  90 tablet    Refill:  0    DO NOT: delete (not duplicate); no partial-fill (will deny script to complete), no refill request (F/U required). DISPENSE: 1 day early if closed on fill date. WARN: No CNS-depressants within 8 hrs of med.   HYDROmorphone (DILAUDID) 4 MG tablet    Sig: Take 1 tablet (4 mg total) by mouth every 8 (eight) hours as needed. Must last 30 days.    Dispense:  90 tablet    Refill:  0    DO NOT: delete (not duplicate); no partial-fill (will deny script to complete), no refill request (F/U required). DISPENSE: 1 day early if closed on fill  date. WARN: No CNS-depressants within 8 hrs of med.   HYDROmorphone (DILAUDID) 4 MG tablet    Sig: Take 1 tablet (4  mg total) by mouth every 8 (eight) hours as needed. Must last 30 days.    Dispense:  90 tablet    Refill:  0    DO NOT: delete (not duplicate); no partial-fill (will deny script to complete), no refill request (F/U required). DISPENSE: 1 day early if closed on fill date. WARN: No CNS-depressants within 8 hrs of med.   Medications administered: We administered capsaicin topical system.  See the medical record for exact dosing, route, and time of administration.  Follow-up plan:   Return in about 2 weeks (around 07/24/2023) for (VV), (PPE).       Interventional Therapies  Risk Factors  Complex Considerations:  Anticoagulation: Eliquis (Stop: 3 days  Restart: 6 hrs)  T2IDDM  CAD  CHF  COPD  Hx. Nephrolithiasis  A-Fib  HTN  IBS  Hx. NSTEMI MI  Hx DVT  Depression  Anxiety    Planned  Pending:   Therapeutic bilateral lower extremity Qutenza treatment #1 (07/10/2023)   Under consideration:   Therapeutic/palliative left L4-5 LESI #1  Therapeutic left caudal ESI #2  Therapeutic bilateral lower extremity Qutenza treatment #1    Completed:   Diagnostic/therapeutic bilateral lumbar facet MBB x2 (05/08/2023) (100/0/0/0) Diagnostic left caudal ESI and epidurogram x1 (04/24/2021) (LBP: 50/50/0  LEP; 100/100/50-60)  Diagnostic intrathecal pump catheter test/evaluation x1 (06/21/2021) (Dx: Catheter malfunction/dysfunction)  Intrathecal catheter and pump removal by me (12/20/2021) (2ry to end of battery life & catheter malfunction)    Completed by other providers:   Diagnostic EMG/PNCV (LE) (05/22/2021) by Dr. 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  Unable to rule out bilateral L5 chronic radiculopathies. Referral (02/07/2021 & 09/19/2021) to [facility] Lucy Chris, MD) for ITP replacement.  Cardiology clearance to Dr. Juliann Pares requested (09/19/2021)    Therapeutic  Palliative (PRN) options:   None established    Pharmacotherapy  Nonopioids transferred (03/20/2022): Pregabalin (Lyrica) 75 mg TID.      Recent Visits Date Type Provider Dept  05/20/23 Office Visit Delano Metz, MD [facility]  05/08/23 Procedure visit Delano Metz, MD [facility]  Showing recent visits within past 90 days and meeting all other requirements Today's Visits Date Type Provider Dept  07/10/23 Procedure visit Delano Metz, MD [facility]  Showing today's visits and meeting all other requirements Future Appointments No visits were found meeting these conditions. Showing future appointments within next 90 days and meeting all other requirements  Disposition: Discharge home  Discharge (Date  Time): 07/10/2023;   hrs.   Primary Care Physician: Lynnea Ferrier, MD Location: [facility] Note by: Oswaldo Done, MD (TTS technology used. I apologize for any typographical errors that were not detected and corrected.) Date: 07/10/2023; Time: 1:51 PM  Disclaimer:  Medicine is not an Visual merchandiser. The only guarantee in medicine is that nothing is guaranteed. It is important to note that the decision to proceed with this intervention was based on the information collected from the patient. The Data and conclusions were drawn from the patient's questionnaire, the interview, and the physical examination. Because the information was provided in large part by the patient, it cannot be guaranteed that it has not been purposely or unconsciously manipulated. Every effort has been made to obtain as much relevant data as possible for this evaluation. It is  important to note that the conclusions that lead to this procedure are derived in large part from the available data. Always take into account that the treatment will also be dependent on availability of resources and existing treatment guidelines, considered by other Pain Management Practitioners as being  common knowledge and practice, at the time of the intervention. For Medico-Legal purposes, it is also important to point out that variation in procedural techniques and pharmacological choices are the acceptable norm. The indications, contraindications, technique, and results of the above procedure should only be interpreted and judged by a Board-Certified Interventional Pain Specialist with extensive familiarity and expertise in the same exact procedure and technique.

## 2023-07-10 ENCOUNTER — Encounter: Payer: Self-pay | Admitting: Pain Medicine

## 2023-07-10 ENCOUNTER — Ambulatory Visit: Payer: Medicare (Managed Care) | Attending: Pain Medicine | Admitting: Pain Medicine

## 2023-07-10 VITALS — BP 133/76 | HR 81 | Temp 98.8°F | Resp 18 | Ht 74.0 in | Wt 185.0 lb

## 2023-07-10 DIAGNOSIS — M79671 Pain in right foot: Secondary | ICD-10-CM | POA: Insufficient documentation

## 2023-07-10 DIAGNOSIS — M792 Neuralgia and neuritis, unspecified: Secondary | ICD-10-CM | POA: Insufficient documentation

## 2023-07-10 DIAGNOSIS — M25561 Pain in right knee: Secondary | ICD-10-CM | POA: Insufficient documentation

## 2023-07-10 DIAGNOSIS — G63 Polyneuropathy in diseases classified elsewhere: Secondary | ICD-10-CM | POA: Diagnosis present

## 2023-07-10 DIAGNOSIS — M5441 Lumbago with sciatica, right side: Secondary | ICD-10-CM | POA: Insufficient documentation

## 2023-07-10 DIAGNOSIS — M79605 Pain in left leg: Secondary | ICD-10-CM | POA: Insufficient documentation

## 2023-07-10 DIAGNOSIS — M961 Postlaminectomy syndrome, not elsewhere classified: Secondary | ICD-10-CM | POA: Diagnosis present

## 2023-07-10 DIAGNOSIS — G894 Chronic pain syndrome: Secondary | ICD-10-CM | POA: Diagnosis present

## 2023-07-10 DIAGNOSIS — G8929 Other chronic pain: Secondary | ICD-10-CM | POA: Insufficient documentation

## 2023-07-10 DIAGNOSIS — M79604 Pain in right leg: Secondary | ICD-10-CM | POA: Insufficient documentation

## 2023-07-10 DIAGNOSIS — M79672 Pain in left foot: Secondary | ICD-10-CM | POA: Diagnosis present

## 2023-07-10 DIAGNOSIS — M5442 Lumbago with sciatica, left side: Secondary | ICD-10-CM | POA: Insufficient documentation

## 2023-07-10 DIAGNOSIS — E1140 Type 2 diabetes mellitus with diabetic neuropathy, unspecified: Secondary | ICD-10-CM | POA: Diagnosis present

## 2023-07-10 DIAGNOSIS — Z79899 Other long term (current) drug therapy: Secondary | ICD-10-CM | POA: Insufficient documentation

## 2023-07-10 DIAGNOSIS — M25572 Pain in left ankle and joints of left foot: Secondary | ICD-10-CM | POA: Insufficient documentation

## 2023-07-10 DIAGNOSIS — M25571 Pain in right ankle and joints of right foot: Secondary | ICD-10-CM | POA: Diagnosis present

## 2023-07-10 DIAGNOSIS — M25562 Pain in left knee: Secondary | ICD-10-CM | POA: Insufficient documentation

## 2023-07-10 DIAGNOSIS — Z79891 Long term (current) use of opiate analgesic: Secondary | ICD-10-CM | POA: Insufficient documentation

## 2023-07-10 MED ORDER — HYDROMORPHONE HCL 4 MG PO TABS: 4.0000 mg | ORAL_TABLET | Freq: Three times a day (TID) | ORAL | 0 refills | Status: DC | PRN

## 2023-07-10 MED ORDER — CAPSAICIN-CLEANSING GEL 8 % EX KIT
4.0000 | PACK | Freq: Once | CUTANEOUS | Status: AC
  Administered 2023-07-10: 4 via TOPICAL
  Filled 2023-07-10: qty 4

## 2023-07-10 MED ORDER — CAPSAICIN-CLEANSING GEL 8 % EX KIT
4.0000 | PACK | Freq: Once | CUTANEOUS | Status: DC
  Filled 2023-07-10: qty 4

## 2023-07-10 NOTE — Progress Notes (Signed)
Nursing Pain Medication Assessment:  Safety precautions to be maintained throughout the outpatient stay will include: orient to surroundings, keep bed in low position, maintain call bell within reach at all times, provide assistance with transfer out of bed and ambulation.  Medication Inspection Compliance: Pill count conducted under aseptic conditions, in front of the patient. Neither the pills nor the bottle was removed from the patient's sight at any time. Once count was completed pills were immediately returned to the patient in their original bottle.  Medication: Hydromorphone (Dilaudid) Pill/Patch Count:  17 of 90 pills remain Pill/Patch Appearance: Markings consistent with prescribed medication Bottle Appearance: Standard pharmacy container. Clearly labeled. Filled Date: 24 / 25 / 2024 Last Medication intake:  Today

## 2023-07-11 ENCOUNTER — Telehealth: Payer: Self-pay | Admitting: *Deleted

## 2023-07-11 NOTE — Telephone Encounter (Signed)
Post procedure call;  voicemail left.   

## 2023-07-21 ENCOUNTER — Encounter: Payer: Self-pay | Admitting: *Deleted

## 2023-07-24 ENCOUNTER — Ambulatory Visit: Payer: Medicare (Managed Care) | Attending: Pain Medicine | Admitting: Pain Medicine

## 2023-07-24 DIAGNOSIS — M5442 Lumbago with sciatica, left side: Secondary | ICD-10-CM

## 2023-07-24 DIAGNOSIS — G63 Polyneuropathy in diseases classified elsewhere: Secondary | ICD-10-CM | POA: Diagnosis not present

## 2023-07-24 DIAGNOSIS — G8929 Other chronic pain: Secondary | ICD-10-CM

## 2023-07-24 DIAGNOSIS — M961 Postlaminectomy syndrome, not elsewhere classified: Secondary | ICD-10-CM

## 2023-07-24 DIAGNOSIS — M25572 Pain in left ankle and joints of left foot: Secondary | ICD-10-CM | POA: Diagnosis not present

## 2023-07-24 DIAGNOSIS — E1140 Type 2 diabetes mellitus with diabetic neuropathy, unspecified: Secondary | ICD-10-CM | POA: Diagnosis not present

## 2023-07-24 DIAGNOSIS — Z09 Encounter for follow-up examination after completed treatment for conditions other than malignant neoplasm: Secondary | ICD-10-CM

## 2023-07-24 DIAGNOSIS — M25571 Pain in right ankle and joints of right foot: Secondary | ICD-10-CM

## 2023-07-24 DIAGNOSIS — M5441 Lumbago with sciatica, right side: Secondary | ICD-10-CM

## 2023-07-24 DIAGNOSIS — R937 Abnormal findings on diagnostic imaging of other parts of musculoskeletal system: Secondary | ICD-10-CM

## 2023-07-24 DIAGNOSIS — M792 Neuralgia and neuritis, unspecified: Secondary | ICD-10-CM

## 2023-07-24 NOTE — Progress Notes (Signed)
 Patient: Nancyann LABOR Eimer  Service Category: E/M  Provider: Eric LABOR Como, MD  DOB: 1945-10-08  DOS: 07/24/2023  Location: Office  MRN: [id number]  Setting: Ambulatory outpatient  Referring Provider: Fernande Ophelia JINNY DOUGLAS, MD  Type: Established Patient  Specialty: Interventional Pain Management  PCP: Fernande Ophelia JINNY DOUGLAS, MD  Location: Remote location  Delivery: TeleHealth     Virtual Encounter - Pain Management PROVIDER NOTE: Information contained herein reflects review and annotations entered in association with encounter. Interpretation of such information and data should be left to medically-trained personnel. Information provided to patient can be located elsewhere in the medical record under Patient Instructions. Document created using STT-dictation technology, any transcriptional errors that may result from process are unintentional.    Contact & Pharmacy Preferred: [phone] Home: [phone] (home) Mobile: There is no such number on file (mobile). E-mail: [email or web address]  [facility] - [place], [place] - [address] [place] [address] Phone: [phone] Fax: [phone]   Pre-screening  Mr. Manus offered in-person vs virtual encounter. He indicated preferring virtual for this encounter.   Reason COVID-19*  Social distancing based on CDC and AMA recommendations.   I contacted Nancyann LABOR Dorthy on 07/24/2023 via telephone.      I clearly identified myself as Eric LABOR Como, MD. I verified that I was speaking with the correct person using two identifiers (Name: OLIVERIO CHO, and date of birth: 04/21/1946).  Consent I sought verbal advanced consent from Nancyann LABOR Dorthy for virtual visit interactions. I informed Mr. Cuevas of possible security and privacy concerns, risks, and limitations associated with providing not-in-person medical evaluation and management services. I also informed Mr. Foushee of the availability of in-person appointments. Finally, I  informed him that there would be a charge for the virtual visit and that he could be  personally, fully or partially, financially responsible for it. Mr. Bradly expressed understanding and agreed to proceed.   Historic Elements   Mr. JENNA ARDOIN is a 78 y.o. year old, male patient evaluated today after our last contact on 07/10/2023. Mr. Shimabukuro  has a past medical history of Acute anterolateral wall MI (HCC) (2000), Acute non-Q wave non-ST elevation myocardial infarction (NSTEMI) (HCC) (12/03/2004), Adenomatous polyp of colon, Agent orange exposure, Angina pectoris (HCC), Anxiety, Aortic atherosclerosis (HCC), Asthma, Bladder stones, Bradycardia, CHF (congestive heart failure) (HCC), Chronic lower back pain, Chronic pain syndrome, COPD (chronic obstructive pulmonary disease) (HCC), Coronary artery disease, Depression, DJD (degenerative joint disease), DVT of lower extremity, bilateral (HCC), Dyspnea, GERD (gastroesophageal reflux disease), History of hiatal hernia, HLD (hyperlipidemia), Hypertension, Hypothyroidism, Inflammatory polyarthropathy (HCC), Internal hemorrhoids, Long term prescription opiate use, 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 He also  has a past surgical history that includes Coronary angioplasty; Posterior lumbar fusion (N/A, 1999); Nissen fundoplication (N/A, 2000); Mastoidectomy; Partial colectomy (N/A); Intrathecal pump implantation (N/A, 03/2008); Intrathecal pump revision (N/A, 09/28/2014); Transurethral  resection of bladder tumor (N/A, 03/07/2015); Colonoscopy with propofol  (N/A, 02/27/2018); External ear surgery; Skin cancer excision; IVC FILTER PLACEMENT ([facility] HX) (N/A, 09/2007); IVC FILTER REMOVAL (N/A, 2011); Colonoscopy (N/A, 11/22/2013); Colonoscopy (N/A, 09/19/2008); Colonoscopy (N/A, 08/25/2003); Colonoscopy (N/A, 10/15/1994); Posterior laminectomy /  decompression cervical spine (N/A, 1984); Posterior laminectomy / decompression cervical spine (N/A, 1985); Tonsillectomy (Bilateral, 1958); Coronary angioplasty with stent (Left, 12/04/2004); LEFT HEART CATH AND CORONARY ANGIOGRAPHY (Left, 02/19/2008); LEFT HEART CATH AND CORONARY ANGIOGRAPHY (Left, 04/18/1987); LEFT HEART CATH AND CORONARY ANGIOGRAPHY (Left, 02/03/2002); and Pain pump removal (N/A, 12/20/2021). Mr. Vangilder has a current medication list which includes the following prescription(s): albuterol , amitriptyline , aspirin  ec, azelastine, bisacodyl , clonidine , duloxetine , hydromorphone , [START ON 08/14/2023] hydromorphone , [START ON 09/13/2023] hydromorphone , levothyroxine , loratadine, losartan , mexiletine, naloxone , simvastatin , and pregabalin . He  reports that he has been smoking cigarettes. He has a 44.3 pack-year smoking history. He has never used smokeless tobacco. He reports that he does not drink alcohol and does not use drugs. Mr. Sample is allergic to celebrex [celecoxib], codeine, penicillins, and entresto  [sacubitril-valsartan].  BMI: Estimated body mass index is 23.75 kg/m as calculated from the following:   Height as of 07/10/23: 6' 2 (1.88 m).   Weight as of 07/10/23: 185 lb (83.9 kg). Last encounter: 05/20/2023. Last procedure: 07/10/2023.  HPI  Today, he is being contacted for a post-procedure assessment.  Unfortunately, the patient attained absolutely no benefit from the Qutenza  treatment.  He continues to have his chronic pain that does not seem to respond to any of the treatments that we have offered him.  At this point I will be  sending him to a neurology consult to see if there is anything that they can contribute towards this improvement.  Post-procedure evaluation   Type: Qutenza  Neurolysis #1  Laterality:  Bilateral Area treated: Feet Imaging Guidance: None Anesthesia/analgesia/anxiolysis/sedation: None required Medication (Right): Qutenza  (capsaicin  8%) topical system Medication (Left): Qutenza  (capsaicin  8%) topical system Date: 07/10/2023 Performed by: Eric DELENA Como, MD Rationale (medical necessity): procedure needed and proper for the treatment of Mr. [facility] medical symptoms and needs. Indication: Painful diabetic peripheral neuralgia (DPN) (ICD-10-CM:E11.40) severe enough to impact quality of life or function. 1. Chronic painful diabetic neuropathy (HCC)   2. Chronic feet pain (3ry area of Pain) (Bilateral) (R>L)   3. Polyneuropathy associated with underlying disease (HCC)   4. Chronic ankle pain (Bilateral)   5. Chronic neurogenic pain   6. Chronic neuropathic pain    NAS-11 Pain score:   Pre-procedure: 9 /10   Post-procedure: 9 /10     Effectiveness:  Initial hour after procedure: 0 %. Subsequent 4-6 hours post-procedure: 0 %. Analgesia past initial 6 hours: 0 %. Ongoing improvement:  Analgesic: No benefit from the Qutenza  treatment. Function: No benefit ROM: No benefit  Pharmacotherapy Assessment  Opioid Analgesic: Hydromorphone  4 mg tablet, 1 tab p.o. 3 times daily MME/day: 60 mg/day    Monitoring: [place] PMP: PDMP reviewed during this encounter.       Pharmacotherapy: No side-effects or adverse reactions reported. Compliance: No problems identified. Effectiveness: Clinically acceptable. Plan: Refer to POC. UDS:  Summary  Date Value Ref Range Status  10/14/2022 Note  Final    Comment:    ==================================================================== [employer] Select 13 (MW) ==================================================================== Test                              Result       Flag       Units  Drug Present and Declared for Prescription Verification   Hydromorphone                   3914         EXPECTED   ng/mg creat  Hydromorphone  may be administered as a scheduled prescription    medication; it is also an expected metabolite of hydrocodone.  ==================================================================== Test                      Result    Flag   Units      Ref Range   Creatinine              84               mg/dL      >=79 ==================================================================== Declared Medications:  The flagging and interpretation on this report are based on the  following declared medications.  Unexpected results may arise from  inaccuracies in the declared medications.   **Note: The testing scope of this panel includes these medications:   Hydromorphone  (Dilaudid )   **Note: The testing scope of this panel does not include the  following reported medications:   Albuterol  (Ventolin  HFA)  Amitriptyline  (Elavil )  Apixaban  (Eliquis )  Aspirin   Azelastine (Astelin)  Bisacodyl   Clonidine  (Catapres )  Duloxetine  (Cymbalta )  Esomeprazole (Nexium)  Latanoprost  (Xalatan )  Levothyroxine  (Synthroid )  Loratadine (Claritin)  Losartan  (Cozaar )  Mexiletine  Naloxone  (Narcan )  Pregabalin  (Lyrica )  Promethazine  (Phenergan )  Simvastatin  (Zocor )  Tizanidine  (Zanaflex ) ==================================================================== For clinical consultation, please call [phone]. ====================================================================    No results found for: CBDTHCR, KATHLYNE, [id number]   Laboratory Chemistry Profile   Renal Lab Results  Component Value Date   BUN 10 01/30/2022   CREATININE 1.00 03/12/2023   BCR 11 01/30/2022   GFRAA >60 03/02/2015   GFRNONAA >60 11/01/2021    Hepatic Lab Results  Component Value Date   AST 31 01/30/2022   ALBUMIN 4.0 01/30/2022   ALKPHOS 96  01/30/2022    Electrolytes Lab Results  Component Value Date   NA 145 (H) 01/30/2022   K 5.2 01/30/2022   CL 104 01/30/2022   CALCIUM 9.6 01/30/2022   MG 2.3 11/01/2021   PHOS 3.7 11/01/2021    Bone Lab Results  Component Value Date   25OHVITD1 22 (L) 02/07/2021   25OHVITD2 <1.0 02/07/2021   25OHVITD3 22 02/07/2021    Inflammation (CRP: Acute Phase) (ESR: Chronic Phase) Lab Results  Component Value Date   CRP 5 01/30/2022   ESRSEDRATE 11 01/30/2022   LATICACIDVEN 0.9 10/28/2021         Note: Above Lab results reviewed.  Imaging  DG PAIN CLINIC C-ARM 1-60 MIN NO REPORT Fluoro was used, but no Radiologist interpretation will be provided.  Please refer to NOTES tab for provider progress note.  Assessment  The primary encounter diagnosis was Chronic painful diabetic neuropathy (HCC). 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  Plan of Care  Problem-specific:  No problem-specific Assessment & Plan notes found for this encounter.  Mr. LEWIE DEMAN has a current medication list which includes the following long-term medication(s): albuterol , amitriptyline , azelastine, duloxetine , hydromorphone , [START ON 08/14/2023] hydromorphone , [START ON 09/13/2023] hydromorphone , levothyroxine , loratadine, losartan , simvastatin , and pregabalin .  Pharmacotherapy (Medications Ordered): No orders of the defined types were placed in this encounter.  Orders:  Orders Placed This Encounter  Procedures   Ambulatory referral  to Neurology    Referral Priority:   Routine     Referral Type:   Consultation    Referral Reason:   Specialty Services Required    Referred to Provider:   Lane Arthea BRAVO, MD    Requested Specialty:   Neurology    Number of Visits Requested:   1   Nursing Instructions:    Please complete this patient's postprocedure evaluation.    Scheduling Instructions:     Please complete this patient's postprocedure evaluation.   Follow-up plan:   Return for scheduled encounter.      Interventional Therapies  Risk Factors  Complex Considerations:  Anticoagulation: Eliquis  (Stop: 3 days  Restart: 6 hrs)  T2IDDM  CAD  CHF  COPD  Hx. Nephrolithiasis  A-Fib  HTN  IBS  Hx. NSTEMI MI  Hx DVT  Depression  Anxiety    Planned  Pending:      Under consideration:   Therapeutic/palliative left L4-5 LESI #1  Therapeutic left caudal ESI #2  Therapeutic bilateral lower extremity Qutenza  treatment #1    Completed:   Therapeutic bilateral lower extremity Qutenza  treatment x1 (07/10/2023) (0/0/0/0) (Do not repeat) Diagnostic/therapeutic bilateral lumbar facet MBB x2 (05/08/2023) (100/0/0/0) Diagnostic left caudal ESI and epidurogram x1 (04/24/2021) (LBP: 50/50/0  LEP; 100/100/50-60)  Diagnostic intrathecal pump catheter test/evaluation x1 (06/21/2021) (Dx: Catheter malfunction/dysfunction)  Intrathecal catheter and pump removal by me (12/20/2021) (2ry to end of battery life & catheter malfunction)    Completed by other providers:   Diagnostic EMG/PNCV (LE) (05/22/2021) by Dr. 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  Unable to rule out bilateral L5 chronic radiculopathies. Referral (02/07/2021 & 09/19/2021) to [facility] neurosurgery Carolan Ahle, MD) for ITP replacement.  Cardiology clearance to Dr. Florencio requested (09/19/2021)    Therapeutic  Palliative (PRN) options:   None established   Pharmacotherapy  Nonopioids transferred (03/20/2022): Pregabalin  (Lyrica ) 75 mg TID.        Recent Visits Date Type Provider Dept  07/10/23 Procedure visit Tanya Glisson, MD [facility]  05/20/23 Office Visit Tanya Glisson, MD [facility]  05/08/23 Procedure visit Tanya Glisson, MD [facility]  Showing recent visits within past 90 days and meeting all other requirements Today's Visits Date Type Provider Dept  07/24/23 Office Visit Tanya Glisson, MD [facility]  Showing today's visits and meeting all other requirements Future Appointments Date Type Provider Dept  10/08/23 Appointment Tanya Glisson, MD [facility]  Showing future appointments within next 90 days and meeting all other requirements  I discussed the assessment and treatment plan with the patient. The patient was provided an opportunity to ask questions and all were answered. The patient agreed with the plan and demonstrated an understanding of the instructions.  Patient advised to call back or seek an in-person evaluation if the symptoms or condition worsens.  Duration of encounter: 12 minutes.  Note by: Glisson DELENA Tanya, MD Date: 07/24/2023; Time: 10:44 AM

## 2023-08-26 ENCOUNTER — Encounter: Payer: Self-pay | Admitting: Neurology

## 2023-08-26 ENCOUNTER — Other Ambulatory Visit: Payer: Self-pay | Admitting: Neurology

## 2023-08-26 ENCOUNTER — Ambulatory Visit
Admission: RE | Admit: 2023-08-26 | Discharge: 2023-08-26 | Disposition: A | Discharge status: Home / Self Care | Payer: Medicare (Managed Care) | Source: Ambulatory Visit | Attending: Neurology | Admitting: Neurology

## 2023-08-26 DIAGNOSIS — R413 Other amnesia: Secondary | ICD-10-CM

## 2023-08-26 DIAGNOSIS — S0990XA Unspecified injury of head, initial encounter: Secondary | ICD-10-CM

## 2023-08-26 DIAGNOSIS — W1800XA Striking against unspecified object with subsequent fall, initial encounter: Secondary | ICD-10-CM

## 2023-08-26 DIAGNOSIS — M25579 Pain in unspecified ankle and joints of unspecified foot: Secondary | ICD-10-CM | POA: Insufficient documentation

## 2023-08-26 DIAGNOSIS — L720 Epidermal cyst: Secondary | ICD-10-CM | POA: Insufficient documentation

## 2023-09-09 ENCOUNTER — Ambulatory Visit (INDEPENDENT_AMBULATORY_CARE_PROVIDER_SITE_OTHER): Payer: Medicare (Managed Care) | Admitting: Vascular Surgery

## 2023-09-12 ENCOUNTER — Other Ambulatory Visit: Payer: Self-pay | Admitting: Cardiology

## 2023-10-02 NOTE — Progress Notes (Signed)
(  10/06/2023) NO-SHOW to medication management encounter.  Did not call to cancel.  UDS needed on next visit.  Patient last seen on 07/10/2023 for bilateral lower extremity Qutenza treatment.  At that time the patient received his 3 prescriptions for hydromorphone 4 mg every 8 hours (90/month).  According to my review of the PMP during the precharting review of records, it would indicate the patient to have filled his third and last 07/10/2023 prescription on 09/17/2023.  This would suggest that he should have enough medication to last until 10/17/2023.  He had an appointment to renew his prescriptions today, but he did not keep that appointment.  He also did not call to cancel it.  On 07/24/2023 we had a virtual visit with the patient were we contacted him to follow-up with the Qutenza treatment which he indicated did not provide him with any benefit whatsoever with regards to his bilateral feet pain.  This would suggest that most of that pain is primarily radicular as opposed to secondary to his diabetic peripheral neuropathy.

## 2023-10-02 NOTE — Patient Instructions (Signed)

## 2023-10-06 ENCOUNTER — Ambulatory Visit (HOSPITAL_BASED_OUTPATIENT_CLINIC_OR_DEPARTMENT_OTHER): Payer: Medicare (Managed Care) | Admitting: Pain Medicine

## 2023-10-06 DIAGNOSIS — Z79891 Long term (current) use of opiate analgesic: Secondary | ICD-10-CM

## 2023-10-06 DIAGNOSIS — G8929 Other chronic pain: Secondary | ICD-10-CM

## 2023-10-06 DIAGNOSIS — M25562 Pain in left knee: Secondary | ICD-10-CM

## 2023-10-06 DIAGNOSIS — G894 Chronic pain syndrome: Secondary | ICD-10-CM

## 2023-10-06 DIAGNOSIS — M961 Postlaminectomy syndrome, not elsewhere classified: Secondary | ICD-10-CM

## 2023-10-06 DIAGNOSIS — Z91199 Patient's noncompliance with other medical treatment and regimen due to unspecified reason: Secondary | ICD-10-CM

## 2023-10-06 DIAGNOSIS — Z79899 Other long term (current) drug therapy: Secondary | ICD-10-CM

## 2023-10-08 ENCOUNTER — Encounter: Payer: Medicare (Managed Care) | Admitting: Pain Medicine

## 2023-10-12 NOTE — Progress Notes (Unsigned)
 PROVIDER NOTE: Information contained herein reflects review and annotations entered in association with encounter. Interpretation of such information and data should be left to medically-trained personnel. Information provided to patient can be located elsewhere in the medical record under "Patient Instructions". Document created using STT-dictation technology, any transcriptional errors that may result from process are unintentional.    Patient: [name]  Service Category: E/M  Provider: Oswaldo Done, MD  DOB: 05-12-1946  DOS: 10/13/2023  Referring Provider: Lynnea Ferrier, MD  MRN: [id number]  Specialty: Interventional Pain Management  PCP: Gabriel Ferrier, MD  Type: Established Patient  Setting: Ambulatory outpatient    Location: Office  Delivery: Face-to-face     HPI  [name], a 78 y.o. year old male, is here today because of his Chronic pain syndrome [G89.4]. [name] primary complain today is No chief complaint on file.  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); Chronic ankle pain (Bilateral); Pain in joint, ankle and foot; Polyneuropathy associated with underlying disease (HCC); Ankle sprain; Rotator cuff sprain; Lumbosacral radiculopathy at S1; Chronic feet pain (3ry area of Pain) (Bilateral) (R>L); Chronic knee pain (4th area of Pain) (Bilateral) (R>L); Failed back surgical syndrome; Lower extremity weakness (Bilateral); Chronic neuropathic pain; Peripheral neurogenic pain; Nonfamilial nocturnal leg cramps (Bilateral); Restless leg syndrome; Pain in right knee; DDD (degenerative disc disease), lumbosacral; Abnormal MRI, lumbar spine (02/12/2022); 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; Lumbar facet arthropathy (Multilevel) (Bilateral); Lumbosacral facet syndrome (Bilateral); Lumbar foraminal narrowing (Multilevel) (Bilateral); Cervicalgia; Pain of both shoulder joints; Radicular pain of shoulder; Occipital neuralgia (Right); Cervico-occipital neuralgia (Right); Cervical facet hypertrophy (Multilevel) (Bilateral); Cervical facet pain; Painful cervical range of motion; Chronic neurogenic pain; Left rib fracture; Chronic painful diabetic neuropathy (HCC); Foot pain; Lumbago; 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); Decreased range of motion of lumbar spine; Lumbar paraspinal muscle spasm; Spondylosis without myelopathy or radiculopathy, lumbosacral region; Chronic low back pain (Bilateral) w/ sciatica (Left); Chronic lower extremity pain (Left); Lumbosacral radiculopathy at L5; DDD (degenerative disc disease), cervical; Abnormal MRI, cervical spine (10/15/2022); Lumbar facet joint pain; Central spinal stenosis (L3-4, L4-5); Lumbar lateral recess (subarticular zone) stenosis (Left: L2-3) (Bilateral: L3-4, L4-5) (L>R); Hypertrophy of ligamentum flavum (L3-4); Lumbar intervertebral disc displacement (IVDD); Lumbar nerve root impingement (Left: L3 at L2-3 & L3-4) (Right: L3 at L3-4); Diabetic generalized sensorimotor polyneuropathy (HCC); Chronic low back pain (Bilateral) w/o sciatica; Low back pain of over 3 months duration; and Lumbar facet joint syndrome on their pertinent problem list. Pain Assessment: Severity of   is reported as a  /10. Location:    / . Onset:  . Quality:  . Timing:  . Modifying factor(s):  Marland Kitchen Vitals:  vitals were not taken for this visit.  BMI: Estimated body mass index is 23.75 kg/m as calculated from the following:   Height as of 07/10/23: 6\' 2"  (1.88 m).   Weight as of 07/10/23: 185 lb (83.9 kg). Last encounter: 10/06/2023. Last procedure: 07/10/2023.  Reason for encounter: medication management.  ***  Discussed the use of AI scribe software for clinical note transcription with the patient, who gave verbal consent to proceed.  History of Present Illness  01/11/2024   Pharmacotherapy Assessment  Analgesic: Hydromorphone 4 mg tablet, 1 tab p.o. 3 times daily MME/day: 60 mg/day    Monitoring: [place] PMP: PDMP reviewed during this encounter.       Pharmacotherapy: No side-effects or adverse reactions reported. Compliance: No problems identified. Effectiveness: Clinically acceptable.  No notes on file  No results found for: "CBDTHCR" No results found for: "D8THCCBX" No results found for: "D9THCCBX"  UDS:  Summary  Date Value Ref Range Status  10/14/2022 Note  Final    Comment:    ==================================================================== [employer] Select 13 (MW) ==================================================================== Test                             Result       Flag       Units  Drug Present and Declared for Prescription Verification   Hydromorphone                  3914         EXPECTED   ng/mg creat    Hydromorphone may be administered as a scheduled prescription    medication; it is also an expected metabolite of hydrocodone.  ==================================================================== Test                      Result    Flag   Units      Ref Range   Creatinine              84               mg/dL      >=96 ==================================================================== Declared Medications:  The flagging and interpretation on this report are based on the  following declared medications.  Unexpected results may arise from  inaccuracies in the declared medications.   **Note: The testing scope of this panel includes these medications:   Hydromorphone (Dilaudid)   **Note: The testing scope of this panel does not include the  following reported medications:   Albuterol (Ventolin HFA)  Amitriptyline (Elavil)  Apixaban  (Eliquis)  Aspirin  Azelastine (Astelin)  Bisacodyl  Clonidine (Catapres)  Duloxetine (Cymbalta)  Esomeprazole (Nexium)  Latanoprost (Xalatan)  Levothyroxine (Synthroid)  Loratadine (Claritin)  Losartan (Cozaar)  Mexiletine  Naloxone (Narcan)  Pregabalin (Lyrica)  Promethazine (Phenergan)  Simvastatin (Zocor)  Tizanidine (Zanaflex) ==================================================================== For clinical consultation, please call [phone]. ====================================================================       ROS  Constitutional: Denies any fever or chills Gastrointestinal: No reported hemesis, hematochezia, vomiting, or acute GI distress Musculoskeletal: Denies any acute onset joint swelling, redness, loss of ROM, or weakness Neurological: No reported episodes of acute onset apraxia, aphasia, dysarthria, agnosia, amnesia, paralysis, loss of coordination, or loss of consciousness  Medication Review  DULoxetine, HYDROmorphone, albuterol, amitriptyline, aspirin EC, azelastine, bisacodyl, cloNIDine, levothyroxine, loratadine, losartan, mexiletine, naloxone, pregabalin, and simvastatin  History Review  Allergy: [name] is allergic to celebrex [celecoxib], codeine, penicillins, and entresto [sacubitril-valsartan]. Drug: [name]  reports no history of drug use. Alcohol:  reports no history of alcohol use. Tobacco:  reports that he has been smoking cigarettes. He has a 44.3 pack-year smoking history. He has never used smokeless tobacco. Social: [name]  reports that he has been smoking cigarettes. He has a 44.3 pack-year smoking history. He has never used smokeless tobacco. He reports that he does not drink alcohol and does not use drugs. Medical:  has a past medical history of Acute anterolateral wall MI (  HCC) (2000), Acute non-Q wave non-ST elevation myocardial infarction (NSTEMI) (HCC) (12/03/2004), Adenomatous polyp of colon, Agent orange exposure, Angina  pectoris (HCC), Anxiety, Aortic atherosclerosis (HCC), Asthma, Bladder stones, Bradycardia, CHF (congestive heart failure) (HCC), Chronic lower back pain, Chronic pain syndrome, COPD (chronic obstructive pulmonary disease) (HCC), Coronary artery disease, Depression, DJD (degenerative joint disease), DVT of lower extremity, bilateral (HCC), Dyspnea, GERD (gastroesophageal reflux disease), History of hiatal hernia, HLD (hyperlipidemia), Hypertension, Hypothyroidism, Inflammatory polyarthropathy (HCC), Internal hemorrhoids, Long term prescription opiate use, 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 Surgical: [name]  has a past surgical history that includes Coronary angioplasty; Posterior lumbar fusion (N/A, 1999); Nissen fundoplication (N/A, 2000); Mastoidectomy; Partial colectomy (N/A); Intrathecal pump implantation (N/A, 03/2008); Intrathecal pump revision (N/A, 09/28/2014); Transurethral resection of bladder tumor (N/A, 03/07/2015); Colonoscopy with propofol (N/A, 02/27/2018); External ear surgery; Skin cancer excision; IVC FILTER PLACEMENT ([facility] HX) (N/A, 09/2007); IVC FILTER REMOVAL (N/A, 2011); Colonoscopy (N/A, 11/22/2013); Colonoscopy (N/A, 09/19/2008); Colonoscopy (N/A, 08/25/2003); Colonoscopy (N/A, 10/15/1994); Posterior laminectomy / decompression cervical spine (N/A, 1984); Posterior laminectomy / decompression cervical spine (N/A, 1985); Tonsillectomy (Bilateral, 1958); Coronary angioplasty with stent (Left, 12/04/2004); LEFT  HEART CATH AND CORONARY ANGIOGRAPHY (Left, 02/19/2008); LEFT HEART CATH AND CORONARY ANGIOGRAPHY (Left, 04/18/1987); LEFT HEART CATH AND CORONARY ANGIOGRAPHY (Left, 02/03/2002); and Pain pump removal (N/A, 12/20/2021). Family: family history includes Emphysema in his father and sister; Heart attack in his father; Heart disease in his brother and mother.  Laboratory Chemistry Profile   Renal Lab Results  Component Value Date   BUN 10 01/30/2022   CREATININE 1.00 03/12/2023   BCR 11 01/30/2022   GFRAA >60 03/02/2015   GFRNONAA >60 11/01/2021    Hepatic Lab Results  Component Value Date   AST 31 01/30/2022   ALBUMIN 4.0 01/30/2022   ALKPHOS 96 01/30/2022    Electrolytes Lab Results  Component Value Date   NA 145 (H) 01/30/2022   K 5.2 01/30/2022   CL 104 01/30/2022   CALCIUM 9.6 01/30/2022   MG 2.3 11/01/2021   PHOS 3.7 11/01/2021    Bone Lab Results  Component Value Date   25OHVITD1 22 (L) 02/07/2021   25OHVITD2 <1.0 02/07/2021   25OHVITD3 22 02/07/2021    Inflammation (CRP: Acute Phase) (ESR: Chronic Phase) Lab Results  Component Value Date   CRP 5 01/30/2022   ESRSEDRATE 11 01/30/2022   LATICACIDVEN 0.9 10/28/2021         Note: Above Lab results reviewed.  Recent Imaging Review  CT HEAD WO CONTRAST ( ) CLINICAL DATA:  Fall.  EXAM: CT HEAD WITHOUT CONTRAST  TECHNIQUE: Contiguous axial images were obtained from the base of the skull through the vertex without intravenous contrast.  RADIATION DOSE REDUCTION: This exam was performed according to the departmental dose-optimization program which includes automated exposure control, adjustment of the mA and/or kV according to patient size and/or use of iterative reconstruction technique.  COMPARISON:  CT head October 28, 2021.  FINDINGS: Brain: No evidence of acute infarction, hemorrhage, hydrocephalus, extra-axial collection or mass lesion/mass effect.  Vascular: No hyperdense vessel.  Skull: No acute  fracture.  Sinuses/Orbits: Clear sinuses.  No acute orbital findings.  Other: No mastoid  effusions.  IMPRESSION: No evidence of acute intracranial abnormality.  Electronically Signed   By: Feliberto Harts M.D.   On: 08/26/2023 16:47 Note: Reviewed        Physical Exam  General appearance: Well nourished, well developed, and well hydrated. In no apparent acute distress Mental status: Alert, oriented x 3 (person, place, & time)       Respiratory: No evidence of acute respiratory distress Eyes: PERLA Vitals: There were no vitals taken for this visit. BMI: Estimated body mass index is 23.75 kg/m as calculated from the following:   Height as of 07/10/23: 6\' 2"  (1.88 m).   Weight as of 07/10/23: 185 lb (83.9 kg). Ideal: Patient weight not recorded  Assessment   Diagnosis Status  1. Chronic pain syndrome   2. Chronic feet pain (3ry area of Pain) (Bilateral) (R>L)   3. Lumbar post-laminectomy syndrome (L4-5, L5-S1)   4. Chronic low back pain (1ry area of Pain) (Bilateral) (R>L) w/ sciatica (Bilateral)   5. Chronic lower extremity pain (2ry area of Pain) (Bilateral) (R>L)   6. Chronic knee pain (4th area of Pain) (Bilateral) (R>L)   7. Failed back surgical syndrome   8. Pharmacologic therapy   9. Chronic use of opiate for therapeutic purpose   10. Encounter for medication management   11. Encounter for chronic pain management    Controlled Controlled Controlled   Updated Problems: Problem  Pain in Joint Involving Ankle and Foot  Epidermoid Cyst of Skin    Plan of Care  Problem-specific:  Assessment and Plan            [name] has a current medication list which includes the following long-term medication(s): albuterol, amitriptyline, azelastine, duloxetine, hydromorphone, levothyroxine, loratadine, losartan, mexiletine, pregabalin, and simvastatin.  Pharmacotherapy (Medications Ordered): No orders of the defined types were placed in this  encounter.  Orders:  No orders of the defined types were placed in this encounter.  Follow-up plan:   No follow-ups on file.      Interventional Therapies  Risk Factors  Complex Considerations:  Anticoagulation: Eliquis (Stop: 3 days  Restart: 6 hrs)  T2IDDM  CAD  CHF  COPD  Hx. Nephrolithiasis  A-Fib  HTN  IBS  Hx. NSTEMI MI  Hx DVT  Depression  Anxiety    Planned  Pending:      Under consideration:   Therapeutic/palliative left L4-5 LESI #1  Therapeutic left caudal ESI #2  Therapeutic bilateral lower extremity Qutenza treatment #1    Completed:   Therapeutic bilateral lower extremity Qutenza treatment x1 (07/10/2023) (0/0/0/0) (Do not repeat) Diagnostic/therapeutic bilateral lumbar facet MBB x2 (05/08/2023) (100/0/0/0) Diagnostic left caudal ESI and epidurogram x1 (04/24/2021) (LBP: 50/50/0  LEP; 100/100/50-60)  Diagnostic intrathecal pump catheter test/evaluation x1 (06/21/2021) (Dx: Catheter malfunction/dysfunction)  Intrathecal catheter and pump removal by me (12/20/2021) (2ry to end of battery life & catheter malfunction)    Completed by other providers:   Diagnostic EMG/PNCV (LE) (05/22/2021) by Dr. Cristopher Peru [facility]) Dx: Electrodiagnostic evidence of generalized sensorimotor polyneuropathy.  Unable to rule out bilateral L5 chronic radiculopathies. Referral (02/07/2021 & 09/19/2021) to [facility] neurosurgery Lucy Chris, MD) for ITP replacement.  Cardiology clearance to Dr. Juliann Pares requested (09/19/2021)    Therapeutic  Palliative (PRN) options:   None established   Pharmacotherapy  Nonopioids transferred (03/20/2022): Pregabalin (Lyrica) 75 mg TID.       Recent Visits Date Type Provider Dept  10/06/23 Office Visit Delano Metz, MD [facility]  07/24/23 Office Visit Delano Metz, MD [facility]  Showing recent visits within past 90 days and meeting all other requirements Future Appointments Date Type Provider Dept   10/13/23 Appointment Delano Metz, MD [facility]  Showing future appointments within next 90 days and meeting all other requirements  I discussed the assessment and treatment plan with the patient. The patient was provided an opportunity to ask questions and all were answered. The patient agreed with the plan and demonstrated an understanding of the instructions.  Patient advised to call back or seek an in-person evaluation if the symptoms or condition worsens.  Duration of encounter: *** minutes.  Total time on encounter, as per AMA guidelines included both the face-to-face and non-face-to-face time personally spent by the physician and/or other qualified health care professional(s) on the day of the encounter (includes time in activities that require the physician or other qualified health care professional and does not include time in activities normally performed by clinical staff). Physician's time may include the following activities when performed: Preparing to see the patient (e.g., pre-charting review of records, searching for previously ordered imaging, lab work, and nerve conduction tests) Review of prior analgesic pharmacotherapies. Reviewing PMP Interpreting ordered tests (e.g., lab work, imaging, nerve conduction tests) Performing post-procedure evaluations, including interpretation of diagnostic procedures Obtaining and/or reviewing separately obtained history Performing a medically appropriate examination and/or evaluation Counseling and educating the patient/family/caregiver Ordering medications, tests, or procedures Referring and communicating with other health care professionals (when not separately reported) Documenting clinical information in the electronic or other health record Independently interpreting results (not separately reported) and communicating results to the patient/ family/caregiver Care coordination (not separately reported)  Note by:  Gabriel Done, MD Date: 10/13/2023; Time: 7:03 AM

## 2023-10-12 NOTE — Patient Instructions (Incomplete)
 ______________________________________________________________________    Opioid Pain Medication Update  To: All patients taking opioid pain medications. (I.e.: hydrocodone, hydromorphone, oxycodone, oxymorphone, morphine, codeine, methadone, tapentadol, tramadol, buprenorphine, fentanyl, etc.)  Re: Updated review of side effects and adverse reactions of opioid analgesics, as well as new information about long term effects of this class of medications.  Direct risks of long-term opioid therapy are not limited to opioid addiction and overdose. Potential medical risks include serious fractures, breathing problems during sleep, hyperalgesia, immunosuppression, chronic constipation, bowel obstruction, myocardial infarction, and tooth decay secondary to xerostomia.  Unpredictable adverse effects that can occur even if you take your medication correctly: Cognitive impairment, respiratory depression, and death. Most people think that if they take their medication "correctly", and "as instructed", that they will be safe. Nothing could be farther from the truth. In reality, a significant amount of recorded deaths associated with the use of opioids has occurred in individuals that had taken the medication for a long time, and were taking their medication correctly. The following are examples of how this can happen: Patient taking his/her medication for a long time, as instructed, without any side effects, is given a certain antibiotic or another unrelated medication, which in turn triggers a "Drug-to-drug interaction" leading to disorientation, cognitive impairment, impaired reflexes, respiratory depression or an untoward event leading to serious bodily harm or injury, including death.  Patient taking his/her medication for a long time, as instructed, without any side effects, develops an acute impairment of liver and/or kidney function. This will lead to a rapid inability of the body to breakdown and eliminate  their pain medication, which will result in effects similar to an "overdose", but with the same medicine and dose that they had always taken. This again may lead to disorientation, cognitive impairment, impaired reflexes, respiratory depression or an untoward event leading to serious bodily harm or injury, including death.  A similar problem will occur with patients as they grow older and their liver and kidney function begins to decrease as part of the aging process.  Background information: Historically, the original case for using long-term opioid therapy to treat chronic noncancer pain was based on safety assumptions that subsequent experience has called into question. In 1996, the American Pain Society and the American Academy of Pain Medicine issued a consensus statement supporting long-term opioid therapy. This statement acknowledged the dangers of opioid prescribing but concluded that the risk for addiction was low; respiratory depression induced by opioids was short-lived, occurred mainly in opioid-naive patients, and was antagonized by pain; tolerance was not a common problem; and efforts to control diversion should not constrain opioid prescribing. This has now proven to be wrong. Experience regarding the risks for opioid addiction, misuse, and overdose in community practice has failed to support these assumptions.  According to the Centers for Disease Control and Prevention, fatal overdoses involving opioid analgesics have increased sharply over the past decade. Currently, more than 96,700 people die from drug overdoses every year. Opioids are a factor in 7 out of every 10 overdose deaths. Deaths from drug overdose have surpassed motor vehicle accidents as the leading cause of death for individuals between the ages of 61 and 64.  Clinical data suggest that neuroendocrine dysfunction may be very common in both men and women, potentially causing hypogonadism, erectile dysfunction, infertility,  decreased libido, osteoporosis, and depression. Recent studies linked higher opioid dose to increased opioid-related mortality. Controlled observational studies reported that long-term opioid therapy may be associated with increased risk for cardiovascular events. Subsequent  meta-analysis concluded that the safety of long-term opioid therapy in elderly patients has not been proven.   Side Effects and adverse reactions: Common side effects: Drowsiness (sedation). Dizziness. Nausea and vomiting. Constipation. Physical dependence -- Dependence often manifests with withdrawal symptoms when opioids are discontinued or decreased. Tolerance -- As you take repeated doses of opioids, you require increased medication to experience the same effect of pain relief. Respiratory depression -- This can occur in healthy people, especially with higher doses. However, people with COPD, asthma or other lung conditions may be even more susceptible to fatal respiratory impairment.  Uncommon side effects: An increased sensitivity to feeling pain and extreme response to pain (hyperalgesia). Chronic use of opioids can lead to this. Delayed gastric emptying (the process by which the contents of your stomach are moved into your small intestine). Muscle rigidity. Immune system and hormonal dysfunction. Quick, involuntary muscle jerks (myoclonus). Arrhythmia. Itchy skin (pruritus). Dry mouth (xerostomia).  Long-term side effects: Chronic constipation. Sleep-disordered breathing (SDB). Increased risk of bone fractures. Hypothalamic-pituitary-adrenal dysregulation. Increased risk of overdose.  RISKS: Respiratory depression and death: Opioids increase the risk of respiratory depression and death.  Drug-to-drug interactions: Opioids are relatively contraindicated in combination with benzodiazepines, sleep inducers, and other central nervous system depressants. Other classes of medications (i.e.: certain antibiotics  and even over-the-counter medications) may also trigger or induce respiratory depression in some patients.  Medical conditions: Patients with pre-existing respiratory problems are at higher risk of respiratory failure and/or depression when in combination with opioid analgesics. Opioids are relatively contraindicated in some medical conditions such as central sleep apnea.   Fractures and Falls:  Opioids increase the risk and incidence of falls. This is of particular importance in elderly patients.  Endocrine System:  Long-term administration is associated with endocrine abnormalities (endocrinopathies). (Also known as Opioid-induced Endocrinopathy) Influences on both the hypothalamic-pituitary-adrenal axis?and the hypothalamic-pituitary-gonadal axis have been demonstrated with consequent hypogonadism and adrenal insufficiency in both sexes. Hypogonadism and decreased levels of dehydroepiandrosterone sulfate have been reported in men and women. Endocrine effects include: Amenorrhoea in women (abnormal absence of menstruation) Reduced libido in both sexes Decreased sexual function Erectile dysfunction in men Hypogonadisms (decreased testicular function with shrinkage of testicles) Infertility Depression and fatigue Loss of muscle mass Anxiety Depression Immune suppression Hyperalgesia Weight gain Anemia Osteoporosis Patients (particularly women of childbearing age) should avoid opioids. There is insufficient evidence to recommend routine monitoring of asymptomatic patients taking opioids in the long-term for hormonal deficiencies.  Immune System: Human studies have demonstrated that opioids have an immunomodulating effect. These effects are mediated via opioid receptors both on immune effector cells and in the central nervous system. Opioids have been demonstrated to have adverse effects on antimicrobial response and anti-tumour surveillance. Buprenorphine has been demonstrated to have  no impact on immune function.  Opioid Induced Hyperalgesia: Human studies have demonstrated that prolonged use of opioids can lead to a state of abnormal pain sensitivity, sometimes called opioid induced hyperalgesia (OIH). Opioid induced hyperalgesia is not usually seen in the absence of tolerance to opioid analgesia. Clinically, hyperalgesia may be diagnosed if the patient on long-term opioid therapy presents with increased pain. This might be qualitatively and anatomically distinct from pain related to disease progression or to breakthrough pain resulting from development of opioid tolerance. Pain associated with hyperalgesia tends to be more diffuse than the pre-existing pain and less defined in quality. Management of opioid induced hyperalgesia requires opioid dose reduction.  Cancer: Chronic opioid therapy has been associated with an increased risk  of cancer among noncancer patients with chronic pain. This association was more evident in chronic strong opioid users. Chronic opioid consumption causes significant pathological changes in the small intestine and colon. Epidemiological studies have found that there is a link between opium dependence and initiation of gastrointestinal cancers. Cancer is the second leading cause of death after cardiovascular disease. Chronic use of opioids can cause multiple conditions such as GERD, immunosuppression and renal damage as well as carcinogenic effects, which are associated with the incidence of cancers.   Mortality: Long-term opioid use has been associated with increased mortality among patients with chronic non-cancer pain (CNCP).  Prescription of long-acting opioids for chronic noncancer pain was associated with a significantly increased risk of all-cause mortality, including deaths from causes other than overdose.  Reference: Von Korff M, Kolodny A, Deyo RA, Chou R. Long-term opioid therapy reconsidered. Ann Intern Med. 2011 Sep 6;155(5):325-8. doi:  10.7326/[id number]. PMID: 16109604; PMCID: [id number]. Randon Goldsmith, Hayward RA, Dunn KM, [place] KP. Risk of adverse events in patients prescribed long-term opioids: A cohort study in the [place] Clinical Practice Research Datalink. Eur J Pain. 2019 May;23(5):908-922. doi: 10.1002/ejp.1357. Epub 2019 Jan 31. PMID: 78295621. Colameco S, Coren JS, Ciervo CA. Continuous opioid treatment for chronic noncancer pain: a time for moderation in prescribing. Postgrad Med. 2009 Jul;121(4):61-6. doi: 10.3810/pgm.2009.07.2032. PMID: 30865784. William Hamburger RN, [place] SD, Blazina I, Cristopher Peru, Bougatsos C, Deyo RA. The effectiveness and risks of long-term opioid therapy for chronic pain: a systematic review for a [employer] of [employer] Pathways to [employer]. Ann Intern Med. 2015 Feb 17;162(4):276-86. doi: 10.7326/M14-2559. PMID: 69629528. Caryl Bis [facility], Makuc DM. NCHS Data Brief No. 22. [place]: Centers for Disease Control and Prevention; 2009. Sep, Increase in Fatal Poisonings Involving Opioid Analgesics in the [place], 1999-2006. Song IA, Choi HR, Oh TK. Long-term opioid use and mortality in patients with chronic non-cancer pain: Ten-year follow-up study in [place] from 2010 through 2019. EClinicalMedicine. 2022 Jul 18;51:101558. doi: 10.1016/j.eclinm.2022.413244. PMID: 01027253; PMCID: [id number]. Huser, W., Schubert, T., Vogelmann, T. et al. All-cause mortality in patients with long-term opioid therapy compared with non-opioid analgesics for chronic non-cancer pain: a database study. BMC Med 18, 162 (2020). http://lester.info/ Rashidian H, Karie Kirks, Malekzadeh R, Haghdoost AA. An Ecological Study of the Association between Opiate Use and Incidence of Cancers. Addict Health. 2016 Fall;8(4):252-260. PMID: 59563875; PMCID: [id number].  Our Goal: Our goal is to control your pain with means other  than the use of opioid pain medications.  Our Recommendation: Talk to your physician about coming off of these medications. We can assist you with the tapering down and stopping these medicines. Based on the new information, even if you cannot completely stop the medication, a decrease in the dose may be associated with a lesser risk. Ask for other means of controlling the pain. Decrease or eliminate those factors that significantly contribute to your pain such as smoking, obesity, and a diet heavily tilted towards "inflammatory" nutrients.  Last Updated: 01/27/2023   ______________________________________________________________________       ______________________________________________________________________    National Pain Medication Shortage  The [place] is experiencing worsening drug shortages. These have had a negative widespread effect on patient care and treatment. Not expected to improve any time soon. Predicted to last past 2029.   Drug shortage list (generic names) Oxycodone IR Oxycodone/APAP Oxymorphone IR Hydromorphone Hydrocodone/APAP Morphine  Where is the problem?  Manufacturing and supply level.  Will this shortage  affect you?  Only if you take any of the above pain medications.  How? You may be unable to fill your prescription.  Your pharmacist may offer a "partial fill" of your prescription. (Warning: Do not accept partial fills.) Prescriptions partially filled cannot be transferred to another pharmacy. Read our Medication Rules and Regulation. Depending on how much medicine you are dependent on, you may experience withdrawals when unable to get the medication.  Recommendations: Consider ending your dependence on opioid pain medications. Ask your pain specialist to assist you with the process. Consider switching to a medication currently not in shortage, such as Buprenorphine. Talk to your pain specialist about this option. Consider decreasing your pain  medication requirements by managing tolerance thru "Drug Holidays". This may help minimize withdrawals, should you run out of medicine. Control your pain thru the use of non-pharmacological interventional therapies.   Your prescriber: Prescribers cannot be blamed for shortages. Medication manufacturing and supply issues cannot be fixed by the prescriber.   NOTE: The prescriber is not responsible for supplying the medication, or solving supply issues. Work with your pharmacist to solve it. The patient is responsible for the decision to take or continue taking the medication and for identifying and securing a legal supply source. By law, supplying the medication is the job and responsibility of the pharmacy. The prescriber is responsible for the evaluation, monitoring, and prescribing of these medications.   Prescribers will NOT: Re-issue prescriptions that have been partially filled. Re-issue prescriptions already sent to a pharmacy.  Re-send prescriptions to a different pharmacy because yours did not have your medication. Ask pharmacist to order more medicine or transfer the prescription to another pharmacy. (Read below.)  New 2023 regulation: "March 22, 2022 Revised Regulation Allows DEA-Registered Pharmacies to Transfer Electronic Prescriptions at a Patient's Request DEA Headquarters Division - Public Information Office Patients now have the ability to request their electronic prescription be transferred to another pharmacy without having to go back to their practitioner to initiate the request. This revised regulation went into effect on Monday, March 18, 2022.     At a patient's request, a DEA-registered retail pharmacy can now transfer an electronic prescription for a controlled substance (schedules II-V) to another DEA-registered retail pharmacy. Prior to this change, patients would have to go through their practitioner to cancel their prescription and have it re-issued to a different  pharmacy. The process was taxing and time consuming for both patients and practitioners.    The Drug Enforcement Administration [facility]) published its intent to revise the process for transferring electronic prescriptions on June 09, 2020.  The final rule was published in the federal register on February 14, 2022 and went into effect 30 days later.  Under the final rule, a prescription can only be transferred once between pharmacies, and only if allowed under existing state or other applicable law. The prescription must remain in its electronic form; may not be altered in any way; and the transfer must be communicated directly between two licensed pharmacists. It's important to note, any authorized refills transfer with the original prescription, which means the entire prescription will be filled at the same pharmacy".  Reference: HugeHand.is [facility] website announcement)  [email or web address] [employer] of [employer])   [employer] / Vol. 88, No. 143 / Thursday, February 14, 2022 / Rules and Regulations DEPARTMENT OF JUSTICE  Drug Enforcement Administration  21 CFR Part 1306  [Docket No. [id number]]  RIN [id number] Transfer of Electronic Prescriptions for Schedules II-V Controlled Substances Between  Pharmacies for Initial Filling  ______________________________________________________________________       ______________________________________________________________________    Transfer of Pain Medication between Pharmacies  Re: 2023 DEA Clarification on existing regulation  Published on DEA Website: March 22, 2022  Title: Revised Regulation Allows DEA-Registered Pharmacies to Electrical engineer Prescriptions at a Patient's Request DEA Headquarters Division - [employer]  "Patients now have the ability to  request their electronic prescription be transferred to another pharmacy without having to go back to their practitioner to initiate the request. This revised regulation went into effect on Monday, March 18, 2022.     At a patient's request, a DEA-registered retail pharmacy can now transfer an electronic prescription for a controlled substance (schedules II-V) to another DEA-registered retail pharmacy. Prior to this change, patients would have to go through their practitioner to cancel their prescription and have it re-issued to a different pharmacy. The process was taxing and time consuming for both patients and practitioners.    The Drug Enforcement Administration [facility]) published its intent to revise the process for transferring electronic prescriptions on June 09, 2020.  The final rule was published in the federal register on February 14, 2022 and went into effect 30 days later.  Under the final rule, a prescription can only be transferred once between pharmacies, and only if allowed under existing state or other applicable law. The prescription must remain in its electronic form; may not be altered in any way; and the transfer must be communicated directly between two licensed pharmacists. It's important to note, any authorized refills transfer with the original prescription, which means the entire prescription will be filled at the same pharmacy."    REFERENCES: 1. DEA website announcement [email or web address]  2. Department of Justice website  [email or web address]  3. DEPARTMENT OF JUSTICE Drug Enforcement Administration 21 CFR Part 1306 [Docket No. [id number]] RIN [id number] "Transfer of Electronic Prescriptions for Schedules II-V Controlled Substances Between Pharmacies for Initial  Filling"  ______________________________________________________________________       ______________________________________________________________________    Medication Rules  Purpose: To inform patients, and their family members, of our medication rules and regulations.  Applies to: All patients receiving prescriptions from our practice (written or electronic).  Pharmacy of record: This is the pharmacy where your electronic prescriptions will be sent. Make sure we have the correct one.  Electronic prescriptions: In compliance with the [facility] Strengthen Opioid Misuse Prevention (STOP) Act of 2017 (Session Conni Elliot [phone]), effective July 22, 2018, all controlled substances must be electronically prescribed. Written prescriptions, faxing, or calling prescriptions to a pharmacy will no longer be done.  Prescription refills: These will be provided only during in-person appointments. No medications will be renewed without a "face-to-face" evaluation with your provider. Applies to all prescriptions.  NOTE: The following applies primarily to controlled substances (Opioid* Pain Medications).   Type of encounter (visit): For patients receiving controlled substances, face-to-face visits are required. (Not an option and not up to the patient.)  Patient's Responsibilities: Pain Pills: Bring all pain pills to every appointment (except for procedure appointments). Pill counts are required.  Pill Bottles: Bring pills in original pharmacy bottle. Bring bottle, even if empty. Always bring the bottle of the most recent fill.  Medication refills: You are responsible for knowing and keeping track of what medications you are taking and when is it that you will need a refill. The day before your appointment: write a list of all prescriptions that need to be refilled. The day of the appointment: give the list to  the admitting nurse. Prescriptions will be written only during appointments. No  prescriptions will be written on procedure days. If you forget a medication: it will not be "Called in", "Faxed", or "electronically sent". You will need to get another appointment to get these prescribed. No early refills. Do not call asking to have your prescription filled early. Partial  or short prescriptions: Occasionally your pharmacy may not have enough pills to fill your prescription.  NEVER ACCEPT a partial fill or a prescription that is short of the total amount of pills that you were prescribed.  With controlled substances the law allows 72 hours for the pharmacy to complete the prescription.  If the prescription is not completed within 72 hours, the pharmacist will require a new prescription to be written. This means that you will be short on your medicine and we WILL NOT send another prescription to complete your original prescription.  Instead, request the pharmacy to send a carrier to a nearby branch to get enough medication to provide you with your full prescription. Prescription Accuracy: You are responsible for carefully inspecting your prescriptions before leaving our office. Have the discharge nurse carefully go over each prescription with you, before taking them home. Make sure that your name is accurately spelled, that your address is correct. Check the name and dose of your medication to make sure it is accurate. Check the number of pills, and the written instructions to make sure they are clear and accurate. Make sure that you are given enough medication to last until your next medication refill appointment. Taking Medication: Take medication as prescribed. When it comes to controlled substances, taking less pills or less frequently than prescribed is permitted and encouraged. Never take more pills than instructed. Never take the medication more frequently than prescribed.  Inform other Doctors: Always inform, all of your healthcare providers, of all the medications you take. Pain  Medication from other Providers: You are not allowed to accept any additional pain medication from any other Doctor or Healthcare provider. There are two exceptions to this rule. (see below) In the event that you require additional pain medication, you are responsible for notifying us, as stated below. Cough Medicine: Often these contain an opioid, such as codeine or hydrocodone. Never accept or take cough medicine containing these opioids if you are already taking an opioid* medication. The combination may cause respiratory failure and death. Medication Agreement: You are responsible for carefully reading and following our Medication Agreement. This must be signed before receiving any prescriptions from our practice. Safely store a copy of your signed Agreement. Violations to the Agreement will result in no further prescriptions. (Additional copies of our Medication Agreement are available upon request.) Laws, Rules, & Regulations: All patients are expected to follow all [address] and [employer], [employer], Rules, [place] [employer]. Ignorance of the Laws does not constitute a valid excuse.  Illegal drugs and Controlled Substances: The use of illegal substances (including, but not limited to marijuana and its derivatives) and/or the illegal use of any controlled substances is strictly prohibited. Violation of this rule may result in the immediate and permanent discontinuation of any and all prescriptions being written by our practice. The use of any illegal substances is prohibited. Adopted CDC guidelines & recommendations: Target dosing levels will be at or below 60 MME/day. Use of benzodiazepines** is not recommended. Urine Drug testing: Patients taking controlled substances will be required to provide a urine sample upon request. Do not void before coming to your medication management appointments.  Hold emptying your bladder until you are admitted. The admitting nurse will inform you if a sample is required. Our  practice reserves the right to call you at any time to provide a sample. Once receiving the call, you have 24 hours to comply with request. Not providing a sample upon request may result in termination of medication therapy.  Exceptions: There are only two exceptions to the rule of not receiving pain medications from other Healthcare Providers. Exception #1 (Emergencies): In the event of an emergency (i.e.: accident requiring emergency care), you are allowed to receive additional pain medication. However, you are responsible for: As soon as you are able, call our office [phone], at any time of the day or night, and leave a message stating your name, the date and nature of the emergency, and the name and dose of the medication prescribed. In the event that your call is answered by a member of our staff, make sure to document and save the date, time, and the name of the person that took your information.  Exception #2 (Planned Surgery): In the event that you are scheduled by another doctor or dentist to have any type of surgery or procedure, you are allowed (for a period no longer than 30 days), to receive additional pain medication, for the acute post-op pain. However, in this case, you are responsible for picking up a copy of our "Post-op Pain Management for Surgeons" handout, and giving it to your surgeon or dentist. This document is available at our office, and does not require an appointment to obtain it. Simply go to our office during business hours (Monday-Thursday from 8:00 AM to 4:00 PM) (Friday 8:00 AM to 12:00 Noon) or if you have a scheduled appointment with [place], prior to your surgery, and ask for it by name. In addition, you are responsible for: calling our office [phone], at any time of the day or night, and leaving a message stating your name, name of your surgeon, type of surgery, and date of procedure or surgery. Failure to comply with your responsibilities may result in termination  of therapy involving the controlled substances.  Consequences:  Non-compliance with the above rules may result in permanent discontinuation of medication prescription therapy. All patients receiving any type of controlled substance is expected to comply with the above patient responsibilities. Not doing so may result in permanent discontinuation of medication prescription therapy. Medication Agreement Violation. Following the above rules, including your responsibilities will help you in avoiding a Medication Agreement Violation ("Breaking your Pain Medication Contract").  *Opioid medications include: morphine, codeine, oxycodone, oxymorphone, hydrocodone, hydromorphone, meperidine, tramadol, tapentadol, buprenorphine, fentanyl, methadone. **Benzodiazepine medications include: diazepam (Valium), alprazolam (Xanax), clonazepam (Klonopine), lorazepam (Ativan), clorazepate (Tranxene), chlordiazepoxide (Librium), estazolam (Prosom), oxazepam (Serax), temazepam (Restoril), triazolam (Halcion) (Last updated: 05/14/2023) ______________________________________________________________________      ______________________________________________________________________    Medication Recommendations and Reminders  Applies to: All patients receiving prescriptions (written and/or electronic).  Medication Rules & Regulations: You are responsible for reading, knowing, and following our "Medication Rules" document. These exist for your safety and that of others. They are not flexible and neither are we. Dismissing or ignoring them is an act of "non-compliance" that may result in complete and irreversible termination of such medication therapy. For safety reasons, "non-compliance" will not be tolerated. As with the [place] fundamental legal principle of "ignorance of the law is no defense", we will accept no excuses for not having read and knowing the content of documents provided to you by  our practice.  Pharmacy of  record:  Definition: This is the pharmacy where your electronic prescriptions will be sent.  We do not endorse any particular pharmacy. It is up to you and your insurance to decide what pharmacy to use.  We do not restrict you in your choice of pharmacy. However, once we write for your prescriptions, we will NOT be re-sending more prescriptions to fix restricted supply problems created by your pharmacy, or your insurance.  The pharmacy listed in the electronic medical record should be the one where you want electronic prescriptions to be sent. If you choose to change pharmacy, simply notify our nursing staff. Changes will be made only during your regular appointments and not over the phone.  Recommendations: Keep all of your pain medications in a safe place, under lock and key, even if you live alone. We will NOT replace lost, stolen, or damaged medication. We do not accept "Police Reports" as proof of medications having been stolen. After you fill your prescription, take 1 week's worth of pills and put them away in a safe place. You should keep a separate, properly labeled bottle for this purpose. The remainder should be kept in the original bottle. Use this as your primary supply, until it runs out. Once it's gone, then you know that you have 1 week's worth of medicine, and it is time to come in for a prescription refill. If you do this correctly, it is unlikely that you will ever run out of medicine. To make sure that the above recommendation works, it is very important that you make sure your medication refill appointments are scheduled at least 1 week before you run out of medicine. To do this in an effective manner, make sure that you do not leave the office without scheduling your next medication management appointment. Always ask the nursing staff to show you in your prescription , when your medication will be running out. Then arrange for the receptionist to get you a return appointment, at least  7 days before you run out of medicine. Do not wait until you have 1 or 2 pills left, to come in. This is very poor planning and does not take into consideration that we may need to cancel appointments due to bad weather, sickness, or emergencies affecting our staff. DO NOT ACCEPT A "Partial Fill": If for any reason your pharmacy does not have enough pills/tablets to completely fill or refill your prescription, do not allow for a "partial fill". The law allows the pharmacy to complete that prescription within 72 hours, without requiring a new prescription. If they do not fill the rest of your prescription within those 72 hours, you will need a separate prescription to fill the remaining amount, which we will NOT provide. If the reason for the partial fill is your insurance, you will need to talk to the pharmacist about payment alternatives for the remaining tablets, but again, DO NOT ACCEPT A PARTIAL FILL, unless you can trust your pharmacist to obtain the remainder of the pills within 72 hours.  Prescription refills and/or changes in medication(s):  Prescription refills, and/or changes in dose or medication, will be conducted only during scheduled medication management appointments. (Applies to both, written and electronic prescriptions.) No refills on procedure days. No medication will be changed or started on procedure days. No changes, adjustments, and/or refills will be conducted on a procedure day. Doing so will interfere with the diagnostic portion of the procedure. No phone refills. No medications will be "  called into the pharmacy". No Fax refills. No weekend refills. No Holliday refills. No after hours refills.  Remember:  Business hours are:  Monday to Thursday 8:00 AM to 4:00 PM Provider's Schedule: Delano Metz, MD - Appointments are:  Medication management: Monday and Wednesday 8:00 AM to 4:00 PM Procedure day: Tuesday and Thursday 7:30 AM to 4:00 PM Edward Jolly, MD -  Appointments are:  Medication management: Tuesday and Thursday 8:00 AM to 4:00 PM Procedure day: Monday and Wednesday 7:30 AM to 4:00 PM (Last update: 05/14/2022) ______________________________________________________________________

## 2023-10-13 ENCOUNTER — Ambulatory Visit: Payer: Medicare (Managed Care) | Attending: Pain Medicine | Admitting: Pain Medicine

## 2023-10-13 ENCOUNTER — Encounter: Payer: Self-pay | Admitting: Pain Medicine

## 2023-10-13 VITALS — BP 121/100 | HR 92 | Temp 97.3°F | Resp 16 | Ht 73.0 in | Wt 185.0 lb

## 2023-10-13 DIAGNOSIS — M5441 Lumbago with sciatica, right side: Secondary | ICD-10-CM | POA: Diagnosis present

## 2023-10-13 DIAGNOSIS — M79604 Pain in right leg: Secondary | ICD-10-CM | POA: Diagnosis present

## 2023-10-13 DIAGNOSIS — M5442 Lumbago with sciatica, left side: Secondary | ICD-10-CM | POA: Insufficient documentation

## 2023-10-13 DIAGNOSIS — M4722 Other spondylosis with radiculopathy, cervical region: Secondary | ICD-10-CM

## 2023-10-13 DIAGNOSIS — M542 Cervicalgia: Secondary | ICD-10-CM | POA: Insufficient documentation

## 2023-10-13 DIAGNOSIS — M5412 Radiculopathy, cervical region: Secondary | ICD-10-CM | POA: Insufficient documentation

## 2023-10-13 DIAGNOSIS — M79671 Pain in right foot: Secondary | ICD-10-CM | POA: Diagnosis not present

## 2023-10-13 DIAGNOSIS — R937 Abnormal findings on diagnostic imaging of other parts of musculoskeletal system: Secondary | ICD-10-CM | POA: Insufficient documentation

## 2023-10-13 DIAGNOSIS — M5030 Other cervical disc degeneration, unspecified cervical region: Secondary | ICD-10-CM | POA: Diagnosis present

## 2023-10-13 DIAGNOSIS — M4800 Spinal stenosis, site unspecified: Secondary | ICD-10-CM | POA: Insufficient documentation

## 2023-10-13 DIAGNOSIS — M25561 Pain in right knee: Secondary | ICD-10-CM | POA: Insufficient documentation

## 2023-10-13 DIAGNOSIS — M961 Postlaminectomy syndrome, not elsewhere classified: Secondary | ICD-10-CM | POA: Insufficient documentation

## 2023-10-13 DIAGNOSIS — G894 Chronic pain syndrome: Secondary | ICD-10-CM | POA: Insufficient documentation

## 2023-10-13 DIAGNOSIS — Z79899 Other long term (current) drug therapy: Secondary | ICD-10-CM | POA: Diagnosis present

## 2023-10-13 DIAGNOSIS — M79672 Pain in left foot: Secondary | ICD-10-CM | POA: Diagnosis not present

## 2023-10-13 DIAGNOSIS — M79605 Pain in left leg: Secondary | ICD-10-CM | POA: Insufficient documentation

## 2023-10-13 DIAGNOSIS — G8929 Other chronic pain: Secondary | ICD-10-CM | POA: Diagnosis present

## 2023-10-13 DIAGNOSIS — M4802 Spinal stenosis, cervical region: Secondary | ICD-10-CM

## 2023-10-13 DIAGNOSIS — M25562 Pain in left knee: Secondary | ICD-10-CM | POA: Insufficient documentation

## 2023-10-13 DIAGNOSIS — M47812 Spondylosis without myelopathy or radiculopathy, cervical region: Secondary | ICD-10-CM | POA: Diagnosis present

## 2023-10-13 DIAGNOSIS — Z79891 Long term (current) use of opiate analgesic: Secondary | ICD-10-CM | POA: Insufficient documentation

## 2023-10-13 MED ORDER — HYDROMORPHONE HCL 4 MG PO TABS: 4.0000 mg | ORAL_TABLET | Freq: Three times a day (TID) | ORAL | 0 refills | Status: DC | PRN

## 2023-10-13 MED ORDER — HYDROMORPHONE HCL 4 MG PO TABS
4.0000 mg | ORAL_TABLET | Freq: Three times a day (TID) | ORAL | 0 refills | Status: AC | PRN
Start: 2023-11-12 — End: 2023-12-31

## 2023-10-13 NOTE — Progress Notes (Signed)
 Nursing Pain Medication Assessment:  Safety precautions to be maintained throughout the outpatient stay will include: orient to surroundings, keep bed in low position, maintain call bell within reach at all times, provide assistance with transfer out of bed and ambulation.  Medication Inspection Compliance: Pill count conducted under aseptic conditions, in front of the patient. Neither the pills nor the bottle was removed from the patient's sight at any time. Once count was completed pills were immediately returned to the patient in their original bottle.  Medication: Hydromorphone (Dilaudid) Pill/Patch Count:  3 of 90 pills remain Pill/Patch Appearance: Markings consistent with prescribed medication Bottle Appearance: Standard pharmacy container. Clearly labeled. Filled Date: 02 / 26 / 2025 Last Medication intake:  Today

## 2023-10-15 LAB — TOXASSURE SELECT 13 (MW), URINE

## 2023-10-24 ENCOUNTER — Ambulatory Visit (INDEPENDENT_AMBULATORY_CARE_PROVIDER_SITE_OTHER): Payer: Medicare (Managed Care) | Admitting: Vascular Surgery

## 2023-10-27 NOTE — Patient Instructions (Incomplete)

## 2023-10-27 NOTE — Progress Notes (Unsigned)
 PROVIDER NOTE: Interpretation of information contained herein should be left to medically-trained personnel. Specific patient instructions are provided elsewhere under "Patient Instructions" section of medical record. This document was created in part using STT-dictation technology, any transcriptional errors that may result from this process are unintentional.  Patient: [name] Type: Established DOB: 02-Jan-1946 MRN: [id number] PCP: Lynnea Ferrier, MD  Service: Procedure DOS: 10/28/2023 Setting: Ambulatory Location: Ambulatory outpatient facility Delivery: Face-to-face Provider: Oswaldo Done, MD Specialty: Interventional Pain Management Specialty designation: 09 Location: Outpatient facility Ref. Prov.: Delano Metz, MD       Interventional Therapy   Procedure: Cervical Epidural Steroid injection (CESI) (Interlaminar) #1  Laterality: Left  Level: C7-T1 Imaging: Fluoroscopy-assisted DOS: 10/28/2023  Performed by: Delano Metz, MD Anesthesia: Local anesthesia (1-2% Lidocaine) Anxiolysis: IV Versed 1.0 mg Sedation: Moderate Sedation Fentanyl 1 mL (50 mcg)   Purpose: Diagnostic/Therapeutic Indications: Cervicalgia, cervical radicular pain, degenerative disc disease, severe enough to impact quality of life or function. 1. Cervicalgia   2. Cervical facet pain   3. Cervical facet hypertrophy (Multilevel) (Bilateral)   4. Cervico-occipital neuralgia (Right)   5. DDD (degenerative disc disease), cervical   6. Occipital neuralgia (Right)   7. Pain of both shoulder joints   8. Radicular pain of shoulder   9. Abnormal MRI, cervical spine (10/15/2022)    NAS-11 score:   Pre-procedure: 8 /10   Post-procedure: 6 /10      Position  Prep  Materials:  Location setting: Procedure suite Position: Prone, on modified reverse trendelenburg to facilitate breathing, with head in head-cradle. Pillows positioned under chest (below chin-level) with cervical spine  flexed. Safety Precautions: Patient was assessed for positional comfort and pressure points before starting the procedure. Prepping solution: DuraPrep (Iodine Povacrylex [0.7% available iodine] and Isopropyl Alcohol, 74% w/w) Prep Area: Entire  cervicothoracic region Approach: percutaneous, paramedial Intended target: Posterior cervical epidural space Materials Procedure:  Tray: Epidural Needle(s): Epidural (Tuohy) Qty: 1 Length: (90mm) 3.5-inch Gauge: 17G  H&P (Pre-op Assessment):  [name] is a 78 y.o. (year old), male patient, seen today for interventional treatment. He  has a past surgical history that includes Coronary angioplasty; Posterior lumbar fusion (N/A, 1999); Nissen fundoplication (N/A, 2000); Mastoidectomy; Partial colectomy (N/A); Intrathecal pump implantation (N/A, 03/2008); Intrathecal pump revision (N/A, 09/28/2014); Transurethral resection of bladder tumor (N/A, 03/07/2015); Colonoscopy with propofol (N/A, 02/27/2018); External ear surgery; Skin cancer excision; IVC FILTER PLACEMENT ([facility]) (N/A, 09/2007); IVC FILTER REMOVAL (N/A, 2011); Colonoscopy (N/A, 11/22/2013); Colonoscopy (N/A, 09/19/2008); Colonoscopy (N/A, 08/25/2003); Colonoscopy (N/A, 10/15/1994); Posterior laminectomy / decompression cervical spine (N/A, 1984); Posterior laminectomy / decompression cervical spine (N/A, 1985); Tonsillectomy (Bilateral, 1958); Coronary angioplasty with stent (Left, 12/04/2004); LEFT HEART CATH AND CORONARY ANGIOGRAPHY (Left, 02/19/2008); LEFT HEART CATH AND CORONARY ANGIOGRAPHY (Left, 04/18/1987); LEFT HEART CATH AND CORONARY ANGIOGRAPHY (Left, 02/03/2002); and Pain pump removal (N/A, 12/20/2021). [name] has a current medication list which includes the following prescription(s): albuterol, amitriptyline, aspirin ec, azelastine, bisacodyl, clonidine, duloxetine, hydromorphone, [START ON 11/12/2023] hydromorphone, [START ON 12/12/2023] hydromorphone, levothyroxine, loratadine, losartan,  mexiletine, naloxone, simvastatin, and pregabalin, and the following Facility-Administered Medications: fentanyl. His primarily concern today is the Neck Pain  Initial Vital Signs:  Pulse/HCG Rate: 64ECG Heart Rate: 68 (nsr) Temp: 98.7 F (37.1 C) Resp: 16 BP: (!) 149/61 SpO2: 94 %  BMI: Estimated body mass index is 25.07 kg/m as calculated from the following:   Height as of this encounter: 6\' 1"  (1.854 m).   Weight as of this encounter:  190 lb (86.2 kg).  Risk Assessment: Allergies: Reviewed. He is allergic to celebrex [celecoxib], codeine, penicillins, and entresto [sacubitril-valsartan].  Allergy Precautions: None required Coagulopathies: Reviewed. None identified.  Blood-thinner therapy: None at this time Active Infection(s): Reviewed. None identified. [name] is afebrile  Site Confirmation: [name] was asked to confirm the procedure and laterality before marking the site Procedure checklist: Completed Consent: Before the procedure and under the influence of no sedative(s), amnesic(s), or anxiolytics, the patient was informed of the treatment options, risks and possible complications. To fulfill our ethical and legal obligations, as recommended by the American Medical Association's Code of Ethics, I have informed the patient of my clinical impression; the nature and purpose of the treatment or procedure; the risks, benefits, and possible complications of the intervention; the alternatives, including doing nothing; the risk(s) and benefit(s) of the alternative treatment(s) or procedure(s); and the risk(s) and benefit(s) of doing nothing. The patient was provided information about the general risks and possible complications associated with the procedure. These may include, but are not limited to: failure to achieve desired goals, infection, bleeding, organ or nerve damage, allergic reactions, paralysis, and death. In addition, the patient was informed of those risks and complications  associated to Spine-related procedures, such as failure to decrease pain; infection (i.e.: Meningitis, epidural or intraspinal abscess); bleeding (i.e.: epidural hematoma, subarachnoid hemorrhage, or any other type of intraspinal or peri-dural bleeding); organ or nerve damage (i.e.: Any type of peripheral nerve, nerve root, or spinal cord injury) with subsequent damage to sensory, motor, and/or autonomic systems, resulting in permanent pain, numbness, and/or weakness of one or several areas of the body; allergic reactions; (i.e.: anaphylactic reaction); and/or death. Furthermore, the patient was informed of those risks and complications associated with the medications. These include, but are not limited to: allergic reactions (i.e.: anaphylactic or anaphylactoid reaction(s)); adrenal axis suppression; blood sugar elevation that in diabetics may result in ketoacidosis or comma; water retention that in patients with history of congestive heart failure may result in shortness of breath, pulmonary edema, and decompensation with resultant heart failure; weight gain; swelling or edema; medication-induced neural toxicity; particulate matter embolism and blood vessel occlusion with resultant organ, and/or nervous system infarction; and/or aseptic necrosis of one or more joints. Finally, the patient was informed that Medicine is not an exact science; therefore, there is also the possibility of unforeseen or unpredictable risks and/or possible complications that may result in a catastrophic outcome. The patient indicated having understood very clearly. We have given the patient no guarantees and we have made no promises. Enough time was given to the patient to ask questions, all of which were answered to the patient's satisfaction. Mr. Hinzman has indicated that he wanted to continue with the procedure. Attestation: I, the ordering provider, attest that I have discussed with the patient the benefits, risks, side-effects,  alternatives, likelihood of achieving goals, and potential problems during recovery for the procedure that I have provided informed consent. Date  Time: 10/28/2023 10:47 AM  Pre-Procedure Preparation:  Monitoring: As per clinic protocol. Respiration, ETCO2, SpO2, BP, heart rate and rhythm monitor placed and checked for adequate function Safety Precautions: Patient was assessed for positional comfort and pressure points before starting the procedure. Time-out: I initiated and conducted the "Time-out" before starting the procedure, as per protocol. The patient was asked to participate by confirming the accuracy of the "Time Out" information. Verification of the correct person, site, and procedure were performed and confirmed by me, the nursing staff, and  the patient. "Time-out" conducted as per Joint Commission's Universal Protocol (UP.01.01.01). Time: 1214 Start Time: 1214 hrs.  Description  Narrative of Procedure:          Rationale (medical necessity): procedure needed and proper for the diagnosis and/or treatment of the patient's medical symptoms and needs. Start Time: 1214 hrs. Safety Precautions: Aspiration looking for blood return was conducted prior to all injections. At no point did we inject any substances, as a needle was being advanced. No attempts were made at seeking any paresthesias. Safe injection practices and needle disposal techniques used. Medications properly checked for expiration dates. SDV (single dose vial) medications used. Description of procedure: Protocol guidelines were followed. The patient was assisted into a comfortable position. The target area was identified and the area prepped in the usual manner. Skin & deeper tissues infiltrated with local anesthetic. Appropriate amount of time allowed to pass for local anesthetics to take effect. Using fluoroscopic guidance, the epidural needle was introduced through the skin, ipsilateral to the reported pain, and advanced to the  target area. Posterior laminar os was contacted and the needle walked caudad, until the lamina was cleared. The ligamentum flavum was engaged and the epidural space identified using "loss-of-resistance technique" with 2-3 ml of PF-NaCl (0.9% NSS), in a 5cc dedicated LOR syringe. (See "Imaging guidance" below for use of contrast details.) Once proper needle placement was secured, and negative aspiration confirmed, the solution was injected in intermittent fashion, asking for systemic symptoms every 0.5cc. The needles were then removed and the area cleansed, making sure to leave some of the prepping solution back to take advantage of its long term bactericidal properties.  Vitals:   10/28/23 1217 10/28/23 1227 10/28/23 1237 10/28/23 1247  BP: (!) 157/92 (!) 162/86 (!) 155/86 (!) 149/81  Pulse:      Resp: 17 12 13 13   Temp:      TempSrc:      SpO2: 100% 99% 96% 96%  Weight:      Height:         End Time: 1217 hrs.  Imaging Guidance (Spinal):          Type of Imaging Technique: Fluoroscopy Guidance (Spinal) Indication(s): Fluoroscopy guidance for needle placement to enhance accuracy in procedures requiring precise needle localization for targeted delivery of medication in or near specific anatomical locations not easily accessible without such real-time imaging assistance. Exposure Time: Please see nurses notes. Contrast: Before injecting any contrast, we confirmed that the patient did not have an allergy to iodine, shellfish, or radiological contrast. Once satisfactory needle placement was completed at the desired level, radiological contrast was injected. Contrast injected under live fluoroscopy. No contrast complications. See chart for type and volume of contrast used. Fluoroscopic Guidance: I was personally present during the use of fluoroscopy. "Tunnel Vision Technique" used to obtain the best possible view of the target area. Parallax error corrected before commencing the procedure.  "Direction-depth-direction" technique used to introduce the needle under continuous pulsed fluoroscopy. Once target was reached, antero-posterior, oblique, and lateral fluoroscopic projection used confirm needle placement in all planes. Images permanently stored in EMR. Interpretation: I personally interpreted the imaging intraoperatively. Adequate needle placement confirmed in multiple planes. Appropriate spread of contrast into desired area was observed. No evidence of afferent or efferent intravascular uptake. No intrathecal or subarachnoid spread observed. Permanent images saved into the patient's record.  Post-operative Assessment:  Post-procedure Vital Signs:  Pulse/HCG Rate: 6465 Temp: 98.7 F (37.1 C) Resp: 13 BP: (!) 149/81 SpO2: 96 %  EBL: None  Complications: No immediate post-treatment complications observed by team, or reported by patient.  Note: The patient tolerated the entire procedure well. A repeat set of vitals were taken after the procedure and the patient was kept under observation following institutional policy, for this type of procedure. Post-procedural neurological assessment was performed, showing return to baseline, prior to discharge. The patient was provided with post-procedure discharge instructions, including a section on how to identify potential problems. Should any problems arise concerning this procedure, the patient was given instructions to immediately contact us, at any time, without hesitation. In any case, we plan to contact the patient by telephone for a follow-up status report regarding this interventional procedure.  Comments:  No additional relevant information.  Plan of Care (POC)  Orders:  Orders Placed This Encounter  Procedures   Cervical Epidural Injection    Indication(s): Radiculitis and cervicalgia associated with cervical degenerative disc disease. Position: Prone Imaging guidance: Fluoroscopy required. Contrast required unless  contraindicated by allergy or severe CKD. Equipment & Materials: Epidural tray & needle.    Scheduling Instructions:     Procedure: Cervical Epidural Steroid Injection/Block     Planned Level(s): C7-T1     Laterality: Left     Anxiolysis: Patient's choice.     Timeframe: Today    Where will this procedure be performed?:   [facility]             by Dr. Conception Oms [facility] C-[facility] 1-60 MIN NO REPORT    Intraoperative interpretation by procedural physician at [facility].    Standing Status:   Standing    Number of Occurrences:   1    Reason for exam::   Assistance in needle guidance and placement for procedures requiring needle placement in or near specific anatomical locations not easily accessible without such assistance.   Informed Consent Details: Physician/Practitioner Attestation; Transcribe to consent form and obtain patient signature    Nursing instructions: Transcribe to consent form and obtain patient signature. Always confirm laterality of pain with Mr. Menees, before procedure.    Physician/Practitioner attestation of informed consent for procedure/surgical case:   I, the physician/practitioner, attest that I have discussed with the patient the benefits, risks, side effects, alternatives, likelihood of achieving goals and potential problems during recovery for the procedure that I have provided informed consent.    Procedure:   Cervical Epidural Steroid Injection (CESI) under fluoroscopic guidance    Physician/Practitioner performing the procedure:   Chyenne Sobczak A. Laban Emperor MD    Indication/Reason:   Indications: Cervicalgia (neck pain), cervical radicular pain, radiculitis (arm/shoulder pain, numbness, and/or weakness), degenerative disc disease, severe enough to greatly impact quality of life or function.   Provide equipment / supplies at bedside    Procedural tray: Epidural Tray (Disposable  single use) Skin infiltration needle: Regular 1.5-in, 25-G,  (x1) Block needle size: Regular standard Catheter: No catheter required    Standing Status:   Standing    Number of Occurrences:   1    Specify:   Epidural Tray   Saline lock IV    Have LR [phone] mL available and administer at 125 mL/hr if patient becomes hypotensive.    Standing Status:   Standing    Number of Occurrences:   1   Chronic Opioid Analgesic:  Hydromorphone 4 mg tablet, 1 tab p.o. 3 times daily MME/day: 60 mg/day    Medications ordered for procedure: Meds ordered this encounter  Medications   iohexol (  OMNIPAQUE) 180 MG/ML injection 10 mL    Must be Myelogram-compatible. If not available, you may substitute with a water-soluble, non-ionic, hypoallergenic, myelogram-compatible radiological contrast medium.   lidocaine (XYLOCAINE) 2 % (with pres) injection 400 mg   pentafluoroprop-tetrafluoroeth (GEBAUERS) aerosol   midazolam (VERSED) 5 MG/5ML injection 0.5-2 mg    Make sure Flumazenil is available in the pyxis when using this medication. If oversedation occurs, administer 0.2 mg IV over 15 sec. If after 45 sec no response, administer 0.2 mg again over 1 min; may repeat at 1 min intervals; not to exceed 4 doses (1 mg)   fentaNYL (SUBLIMAZE) injection 25-50 mcg    Make sure Narcan is available in the pyxis when using this medication. In the event of respiratory depression (RR< 8/min): Titrate NARCAN (naloxone) in increments of 0.1 to 0.2 mg IV at 2-3 minute intervals, until desired degree of reversal.   sodium chloride flush (NS) 0.9 % injection 1 mL   ropivacaine (PF) 2 mg/mL (0.2%) (NAROPIN) injection 1 mL   dexamethasone (DECADRON) injection 10 mg   Medications administered: We administered iohexol, lidocaine, pentafluoroprop-tetrafluoroeth, midazolam, fentaNYL, sodium chloride flush, ropivacaine (PF) 2 mg/mL (0.2%), and dexamethasone.  See the medical record for exact dosing, route, and time of administration.  Follow-up plan:   Return in about 2 weeks (around  11/11/2023) for (Face2F), (PPE).       Interventional Therapies  Risk Factors  Complex Considerations:  T2IDDM  CAD  CHF  COPD  Hx. Nephrolithiasis  A-Fib  HTN  IBS  Hx. NSTEMI MI  Hx DVT  Depression  Anxiety    Planned  Pending:   Diagnostic/Therapeutic left CESI #1    Under consideration:   Therapeutic/palliative left L4-5 LESI #1  Therapeutic left caudal ESI #2  Therapeutic bilateral lower extremity Qutenza treatment #1    Completed:   Therapeutic bilateral lower extremity Qutenza treatment x1 (07/10/2023) (0/0/0/0) (Do not repeat) Diagnostic/therapeutic bilateral lumbar facet MBB x2 (05/08/2023) (100/0/0/0) Diagnostic left caudal ESI and epidurogram x1 (04/24/2021) (LBP: 50/50/0  LEP; 100/100/50-60)  Diagnostic intrathecal pump catheter test/evaluation x1 (06/21/2021) (Dx: Catheter malfunction/dysfunction)  Intrathecal catheter and pump removal by me (12/20/2021) (2ry to end of battery life & catheter malfunction)    Completed by other providers:   Diagnostic EMG/PNCV (LE) (05/22/2021) by Dr. 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  Unable to rule out bilateral L5 chronic radiculopathies. Referral (02/07/2021 & 09/19/2021) to [facility] neurosurgery Lucy Chris, MD) for ITP replacement.  Cardiology clearance to Dr. Juliann Pares requested (09/19/2021)    Therapeutic  Palliative (PRN) options:   None established   Pharmacotherapy  Nonopioids transferred (03/20/2022): Pregabalin (Lyrica) 75 mg TID.       Recent Visits Date Type Provider Dept  10/13/23 Office Visit Delano Metz, MD [facility]  10/06/23 Office Visit Delano Metz, MD [facility]  Showing recent visits within past 90 days and meeting all other requirements Today's Visits Date Type Provider Dept  10/28/23 Procedure visit Delano Metz, MD [facility]  Showing today's visits and meeting all other  requirements Future Appointments Date Type Provider Dept  11/13/23 Appointment Delano Metz, MD [facility]  01/07/24 Appointment Delano Metz, MD [facility]  Showing future appointments within next 90 days and meeting all other requirements  Disposition: Discharge home  Discharge (Date  Time): 10/28/2023; 1250 hrs.   Primary Care Physician: Lynnea Ferrier, MD Location: [facility] Note by: Sydnee Levans  Laban Emperor, MD (TTS technology used. I apologize for any typographical errors that were not detected and corrected.) Date: 10/28/2023; Time: 1:10 PM  Disclaimer:  Medicine is not an Visual merchandiser. The only guarantee in medicine is that nothing is guaranteed. It is important to note that the decision to proceed with this intervention was based on the information collected from the patient. The Data and conclusions were drawn from the patient's questionnaire, the interview, and the physical examination. Because the information was provided in large part by the patient, it cannot be guaranteed that it has not been purposely or unconsciously manipulated. Every effort has been made to obtain as much relevant data as possible for this evaluation. It is important to note that the conclusions that lead to this procedure are derived in large part from the available data. Always take into account that the treatment will also be dependent on availability of resources and existing treatment guidelines, considered by other Pain Management Practitioners as being common knowledge and practice, at the time of the intervention. For Medico-Legal purposes, it is also important to point out that variation in procedural techniques and pharmacological choices are the acceptable norm. The indications, contraindications, technique, and results of the above procedure should only be interpreted and judged by a Board-Certified Interventional Pain Specialist with  extensive familiarity and expertise in the same exact procedure and technique.

## 2023-10-28 ENCOUNTER — Ambulatory Visit
Admission: RE | Admit: 2023-10-28 | Discharge: 2023-10-28 | Disposition: A | Discharge status: Home / Self Care | Payer: Medicare (Managed Care) | Source: Ambulatory Visit | Attending: Pain Medicine | Admitting: Pain Medicine

## 2023-10-28 ENCOUNTER — Ambulatory Visit: Payer: Medicare (Managed Care) | Attending: Pain Medicine | Admitting: Pain Medicine

## 2023-10-28 ENCOUNTER — Encounter: Payer: Self-pay | Admitting: Pain Medicine

## 2023-10-28 VITALS — BP 149/81 | HR 64 | Temp 98.7°F | Resp 13 | Ht 73.0 in | Wt 190.0 lb

## 2023-10-28 DIAGNOSIS — M25512 Pain in left shoulder: Secondary | ICD-10-CM | POA: Insufficient documentation

## 2023-10-28 DIAGNOSIS — R937 Abnormal findings on diagnostic imaging of other parts of musculoskeletal system: Secondary | ICD-10-CM | POA: Insufficient documentation

## 2023-10-28 DIAGNOSIS — M5412 Radiculopathy, cervical region: Secondary | ICD-10-CM | POA: Insufficient documentation

## 2023-10-28 DIAGNOSIS — M47812 Spondylosis without myelopathy or radiculopathy, cervical region: Secondary | ICD-10-CM

## 2023-10-28 DIAGNOSIS — M4800 Spinal stenosis, site unspecified: Secondary | ICD-10-CM | POA: Insufficient documentation

## 2023-10-28 DIAGNOSIS — M542 Cervicalgia: Secondary | ICD-10-CM | POA: Insufficient documentation

## 2023-10-28 DIAGNOSIS — M5030 Other cervical disc degeneration, unspecified cervical region: Secondary | ICD-10-CM | POA: Diagnosis not present

## 2023-10-28 DIAGNOSIS — M25511 Pain in right shoulder: Secondary | ICD-10-CM | POA: Diagnosis present

## 2023-10-28 DIAGNOSIS — M5481 Occipital neuralgia: Secondary | ICD-10-CM

## 2023-10-28 DIAGNOSIS — M5413 Radiculopathy, cervicothoracic region: Secondary | ICD-10-CM | POA: Diagnosis not present

## 2023-10-28 MED ORDER — SODIUM CHLORIDE 0.9% FLUSH
1.0000 mL | Freq: Once | INTRAVENOUS | Status: AC
Start: 2023-10-28 — End: 2023-10-28
  Administered 2023-10-28: 1 mL

## 2023-10-28 MED ORDER — DEXAMETHASONE SODIUM PHOSPHATE 10 MG/ML IJ SOLN
INTRAMUSCULAR | Status: AC
  Filled 2023-10-28: qty 1

## 2023-10-28 MED ORDER — DEXAMETHASONE SODIUM PHOSPHATE 10 MG/ML IJ SOLN
10.0000 mg | Freq: Once | INTRAMUSCULAR | Status: AC
  Administered 2023-10-28: 10 mg

## 2023-10-28 MED ORDER — LIDOCAINE HCL 2 % IJ SOLN
20.0000 mL | Freq: Once | INTRAMUSCULAR | Status: AC
  Administered 2023-10-28: 100 mg

## 2023-10-28 MED ORDER — FENTANYL CITRATE (PF) 100 MCG/2ML IJ SOLN
INTRAMUSCULAR | Status: AC
  Filled 2023-10-28: qty 2

## 2023-10-28 MED ORDER — PENTAFLUOROPROP-TETRAFLUOROETH EX AERO
INHALATION_SPRAY | Freq: Once | CUTANEOUS | Status: AC
  Administered 2023-10-28: 30 via TOPICAL

## 2023-10-28 MED ORDER — MIDAZOLAM HCL 5 MG/5ML IJ SOLN
0.5000 mg | Freq: Once | INTRAMUSCULAR | Status: AC
Start: 2023-10-28 — End: 2023-10-28
  Administered 2023-10-28: 1 mg via INTRAVENOUS

## 2023-10-28 MED ORDER — SODIUM CHLORIDE (PF) 0.9 % IJ SOLN
INTRAMUSCULAR | Status: AC
  Filled 2023-10-28: qty 10

## 2023-10-28 MED ORDER — FENTANYL CITRATE (PF) 100 MCG/2ML IJ SOLN
25.0000 ug | INTRAMUSCULAR | Status: DC | PRN
  Administered 2023-10-28: 50 ug via INTRAVENOUS

## 2023-10-28 MED ORDER — LIDOCAINE HCL (PF) 2 % IJ SOLN
INTRAMUSCULAR | Status: AC
Start: 2023-10-28 — End: ?
  Filled 2023-10-28: qty 10

## 2023-10-28 MED ORDER — ROPIVACAINE HCL 2 MG/ML IJ SOLN
1.0000 mL | Freq: Once | INTRAMUSCULAR | Status: AC
Start: 2023-10-28 — End: 2023-10-28
  Administered 2023-10-28: 1 mL via EPIDURAL

## 2023-10-28 MED ORDER — IOHEXOL 180 MG/ML  SOLN
10.0000 mL | Freq: Once | INTRAMUSCULAR | Status: AC
Start: 2023-10-28 — End: 2023-10-28
  Administered 2023-10-28: 10 mL via EPIDURAL

## 2023-10-28 MED ORDER — MIDAZOLAM HCL 5 MG/5ML IJ SOLN
INTRAMUSCULAR | Status: AC
  Filled 2023-10-28: qty 5

## 2023-10-28 MED ORDER — IOHEXOL 180 MG/ML  SOLN
INTRAMUSCULAR | Status: AC
  Filled 2023-10-28: qty 10

## 2023-10-28 MED ORDER — ROPIVACAINE HCL 2 MG/ML IJ SOLN
INTRAMUSCULAR | Status: AC
  Filled 2023-10-28: qty 20

## 2023-10-29 ENCOUNTER — Telehealth: Payer: Self-pay | Admitting: Pain Medicine

## 2023-10-29 ENCOUNTER — Telehealth: Payer: Self-pay | Admitting: *Deleted

## 2023-10-29 NOTE — Telephone Encounter (Signed)
 Attempted to call for post procedure follow-up. Message left.

## 2023-10-29 NOTE — Telephone Encounter (Signed)
 Reports no problems post procedure.

## 2023-10-29 NOTE — Telephone Encounter (Signed)
 PT stated that he missed the call this morning. Ask will a nurse give him a call back. PT was in office on yesterday for procedure. TY

## 2023-11-06 ENCOUNTER — Ambulatory Visit: Payer: Medicare (Managed Care) | Admitting: Pain Medicine

## 2023-11-13 ENCOUNTER — Ambulatory Visit: Payer: Medicare (Managed Care) | Admitting: Pain Medicine

## 2023-11-18 ENCOUNTER — Encounter: Payer: Self-pay | Admitting: Pain Medicine

## 2023-11-18 ENCOUNTER — Ambulatory Visit: Payer: Medicare (Managed Care) | Attending: Pain Medicine | Admitting: Pain Medicine

## 2023-11-18 VITALS — BP 168/103 | HR 86 | Temp 97.4°F | Resp 18 | Ht 73.0 in | Wt 190.0 lb

## 2023-11-18 DIAGNOSIS — M5481 Occipital neuralgia: Secondary | ICD-10-CM | POA: Insufficient documentation

## 2023-11-18 DIAGNOSIS — M47812 Spondylosis without myelopathy or radiculopathy, cervical region: Secondary | ICD-10-CM | POA: Diagnosis present

## 2023-11-18 DIAGNOSIS — M25511 Pain in right shoulder: Secondary | ICD-10-CM | POA: Insufficient documentation

## 2023-11-18 DIAGNOSIS — M5412 Radiculopathy, cervical region: Secondary | ICD-10-CM | POA: Diagnosis present

## 2023-11-18 DIAGNOSIS — M542 Cervicalgia: Secondary | ICD-10-CM | POA: Diagnosis present

## 2023-11-18 DIAGNOSIS — M25512 Pain in left shoulder: Secondary | ICD-10-CM | POA: Insufficient documentation

## 2023-11-18 DIAGNOSIS — Z09 Encounter for follow-up examination after completed treatment for conditions other than malignant neoplasm: Secondary | ICD-10-CM | POA: Diagnosis present

## 2023-11-18 NOTE — Progress Notes (Signed)
 PROVIDER NOTE: Interpretation of information contained herein should be left to medically-trained personnel. Specific patient instructions are provided elsewhere under "Patient Instructions" section of medical record. This document was created in part using AI and STT-dictation technology, any transcriptional errors that may result from this process are unintentional.  Patient: [name]  Service: E/M   PCP: Melchor Spoon, MD  DOB: January 02, 1946  DOS: 11/18/2023  Provider: Candi Chafe, MD  MRN: [id number]  Delivery: Face-to-face  Specialty: Interventional Pain Management  Type: Established Patient  Setting: Ambulatory outpatient facility  Specialty designation: 09  Referring Prov.: Melchor Spoon, MD  Location: Outpatient office facility       HPI  [name], a 78 y.o. year old male, is here today because of his Cervicalgia [M54.2]. Mr. Bingaman primary complain today is Back Pain  Pertinent problems: Mr. 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); Chronic ankle pain (Bilateral); Pain in joint, ankle and foot; Polyneuropathy associated with underlying disease (HCC); Ankle sprain; Rotator cuff sprain; Lumbosacral radiculopathy at S1; Chronic feet pain (3ry area of Pain) (Bilateral) (R>L); Chronic knee pain (4th area of Pain) (Bilateral) (R>L); Failed back surgical syndrome; Lower extremity weakness (Bilateral); Chronic neuropathic pain; Peripheral neurogenic pain; Nonfamilial nocturnal leg cramps (Bilateral); Restless leg syndrome; Pain in right knee; DDD (degenerative disc disease), lumbosacral; Abnormal MRI, lumbar spine (02/12/2022); 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;  Lumbar facet arthropathy (Multilevel) (Bilateral); Lumbosacral facet syndrome (Bilateral); Lumbar foraminal narrowing (Multilevel) (Bilateral); Cervicalgia; Pain of both shoulder joints; Radicular pain of shoulder; Occipital neuralgia (Right); Cervico-occipital neuralgia (Right); Cervical facet hypertrophy (Multilevel) (Bilateral); Cervical facet pain; Painful cervical range of motion; Chronic neurogenic pain; Left rib fracture; Chronic painful diabetic neuropathy (HCC); Foot pain; Lumbago; 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); Decreased range of motion of lumbar spine; Lumbar paraspinal muscle spasm; Spondylosis without myelopathy or radiculopathy, lumbosacral region; Chronic low back pain (Bilateral) w/ sciatica (Left); Chronic lower extremity pain (Left); Lumbosacral radiculopathy at L5; DDD (degenerative disc disease), cervical; Abnormal MRI, cervical spine (10/15/2022); Lumbar facet joint pain; Central spinal stenosis (L3-4, L4-5); Lumbar lateral recess (subarticular zone) stenosis (Left: L2-3) (Bilateral: L3-4, L4-5) (L>R); Hypertrophy of ligamentum flavum (L3-4); Lumbar intervertebral disc displacement (IVDD); Lumbar nerve root impingement (Left: L3 at L2-3 & L3-4) (Right: L3 at L3-4); Diabetic generalized sensorimotor polyneuropathy (HCC); Chronic low back pain (Bilateral) w/o sciatica; Low back pain of over 3 months duration; Lumbar facet joint syndrome; and Pain in joint involving ankle and foot on their pertinent problem list. Pain Assessment: Severity of Chronic pain is reported as a 8 /10. Location: Back Lower/radiates down both legs to feet in the inside and outside. Onset: More than a month ago. Quality: Sharp. Timing: Constant. Modifying factor(s): medications. Vitals:  height is 6\' 1"  (1.854 m) and weight is 190 lb (86.2 kg). His temperature is 97.4 F (36.3 C) (abnormal). His blood pressure is 168/103 (abnormal) and his pulse is 86. His respiration is 18 and oxygen saturation is  99%.  BMI: Estimated body mass index is 25.07 kg/m as calculated from the following:   Height as of this encounter: 6\' 1"  (1.854 m).   Weight as of this encounter: 190 lb (86.2 kg). Last encounter: 10/13/2023. Last procedure:  10/28/2023.  Reason for encounter: post-procedure evaluation and assessment.   Discussed the use of AI scribe software for clinical note transcription with the patient, who gave verbal consent to proceed.  History of Present Illness   [name] is a 78 year old male who presents with neck and shoulder pain.  He experiences pain primarily in his neck, with radiation down his legs and back. The pain in his shoulders is particularly intense when he sleeps on his side, causing him to wake up due to discomfort. The pain affects both shoulders equally and is exacerbated by certain positions during sleep.  He has previously received an epidural injection, which provided some relief, but the pain has since returned. No pain radiating to his hands, but there is pain in the upper arms, primarily from the shoulders to the elbows. Raising his arms above his head induces pain, suggesting involvement of the shoulder joints.  His past diagnostic workup includes an MRI of the lumbar and cervical spine and a CT scan of the head with contrast, performed in February. X-rays of both shoulders in 2023 revealed some degenerative changes.  He is not currently taking any blood thinners, which is relevant for his planned epidural treatment.      Post-procedure evaluation   Procedure: Cervical Epidural Steroid injection (CESI) (Interlaminar) #1  Laterality: Left  Level: C7-T1 Imaging: Fluoroscopy-assisted DOS: 10/28/2023  Performed by: Renaldo Caroli, MD Anesthesia: Local anesthesia (1-2% Lidocaine ) Anxiolysis: IV Versed  1.0 mg Sedation: Moderate Sedation Fentanyl  1 mL (50 mcg)   Purpose: Diagnostic/Therapeutic Indications: Cervicalgia, cervical radicular pain, degenerative disc  disease, severe enough to impact quality of life or function. 1. Cervicalgia   2. Cervical facet pain   3. Cervical facet hypertrophy (Multilevel) (Bilateral)   4. Cervico-occipital neuralgia (Right)   5. DDD (degenerative disc disease), cervical   6. Occipital neuralgia (Right)   7. Pain of both shoulder joints   8. Radicular pain of shoulder   9. Abnormal MRI, cervical spine (10/15/2022)    NAS-11 score:   Pre-procedure: 8 /10   Post-procedure: 6 /10    Effectiveness:  Initial hour after procedure: 50 %. Subsequent 4-6 hours post-procedure: 60 %. Analgesia past initial 6 hours: 75 % (lasting a week). Ongoing improvement:  Analgesic: The patient indicates having attained bilateral 50 to 60% improvement of his pain for the duration of local anesthetic followed by an increase to a 75% improvement lasting for approximately 1 week.  Today he returns indicating that he is still having some pain in the cervical region, but he seems to now localize pain to both of his shoulders which upon physical exam today we have demonstrated that he has decreased range motion that also seems to be painful. Function: Transient improvement ROM: Transient improvement   Pharmacotherapy Assessment  Analgesic: Hydromorphone  4 mg tablet, 1 tab p.o. 3 times daily MME/day: 60 mg/day    Monitoring: [place] PMP: PDMP reviewed during this encounter.       Pharmacotherapy: No side-effects or adverse reactions reported. Compliance: No problems identified. Effectiveness: Clinically acceptable.  Humberto Magnus, RN  11/18/2023  2:26 PM  Sign when Signing Visit Safety precautions to be maintained throughout the outpatient stay will include: orient to surroundings, keep bed in low position, maintain call bell within reach at all times, provide assistance with transfer out of bed and ambulation.    No results found for: "CBDTHCR" No results found for: "D8THCCBX" No results found for: "D9THCCBX"  UDS:  Summary  Date  Value Ref Range Status  10/13/2023 FINAL  Final    Comment:    ==================================================================== [employer] [facility] [employer] (MW) ==================================================================== Test                             Result       Flag       Units  Drug Present and Declared for Prescription Verification   Hydromorphone                   9655         EXPECTED   ng/mg creat    Hydromorphone  may be administered as a scheduled prescription    medication; it is also an expected metabolite of hydrocodone.  ==================================================================== Test                      Result    Flag   Units      Ref Range   Creatinine              51               mg/dL      >=46 ==================================================================== Declared Medications:  The flagging and interpretation on this report are based on the  following declared medications.  Unexpected results may arise from  inaccuracies in the declared medications.   **Note: The testing scope of this panel includes these medications:   Hydromorphone  (Dilaudid )   **Note: The testing scope of this panel does not include the  following reported medications:   Albuterol  (Ventolin  HFA)  Amitriptyline  (Elavil )  Aspirin   Azelastine (Astelin)  Bisacodyl   Clonidine  (Catapres )  Duloxetine  (Cymbalta )  Levothyroxine  (Synthroid )  Loratadine (Claritin)  Losartan  (Cozaar )  Mexiletine (Mexitil)  Naloxone  (Narcan )  Pregabalin  (Lyrica )  Simvastatin  (Zocor ) ==================================================================== For clinical consultation, please call [phone]. ====================================================================       ROS  Constitutional: Denies any fever or chills Gastrointestinal: No reported hemesis, hematochezia, vomiting, or acute GI distress Musculoskeletal: Denies any acute onset joint swelling, redness, loss of ROM,  or weakness Neurological: No reported episodes of acute onset apraxia, aphasia, dysarthria, agnosia, amnesia, paralysis, loss of coordination, or loss of consciousness  Medication Review  DULoxetine , HYDROmorphone , albuterol , amitriptyline , aspirin  EC, azelastine, bisacodyl , cloNIDine , levothyroxine , loratadine, losartan , mexiletine, naloxone , pregabalin , and simvastatin   History Review  Allergy: Mr. Weigman is allergic to celebrex [celecoxib], codeine, penicillins, and entresto  [sacubitril-valsartan]. Drug: Mr. Virola  reports no history of drug use. Alcohol:  reports no history of alcohol use. Tobacco:  reports that he has been smoking cigarettes. He has a 44.3 pack-year smoking history. He has never used smokeless tobacco. Social: Mr. Wice  reports that he has been smoking cigarettes. He has a 44.3 pack-year smoking history. He has never used smokeless tobacco. He reports that he does not drink alcohol and does not use drugs. Medical:  has a past medical history of Acute anterolateral wall MI (HCC) (2000), Acute non-Q wave non-ST elevation myocardial infarction (NSTEMI) (HCC) (12/03/2004), Adenomatous polyp of colon, Agent orange exposure, Angina pectoris (HCC), Anxiety, Aortic atherosclerosis (HCC), Asthma, Bladder stones, Bradycardia, CHF (congestive heart failure) (HCC), Chronic lower back pain, Chronic pain syndrome, COPD (chronic obstructive pulmonary disease) (HCC), Coronary artery disease, Depression, DJD (degenerative joint disease), DVT of lower extremity, bilateral (HCC), Dyspnea, GERD (gastroesophageal reflux disease), History of hiatal hernia, HLD (hyperlipidemia), Hypertension, Hypothyroidism, Inflammatory polyarthropathy (HCC), Internal hemorrhoids, Long term prescription opiate use, 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 Surgical: Mr. Swan  has a past surgical history that includes Coronary angioplasty; Posterior lumbar fusion (N/A, 1999); Nissen fundoplication (N/A, 2000); Mastoidectomy; Partial colectomy (N/A); Intrathecal pump implantation (N/A, 03/2008); Intrathecal pump revision (N/A, 09/28/2014); Transurethral resection of bladder tumor (N/A, 03/07/2015); Colonoscopy with propofol  (N/A, 02/27/2018); External ear surgery; Skin cancer excision; IVC FILTER PLACEMENT ([facility] HX) (N/A, 09/2007); IVC FILTER REMOVAL (N/A, 2011); Colonoscopy (N/A, 11/22/2013); Colonoscopy (N/A, 09/19/2008); Colonoscopy (N/A, 08/25/2003); Colonoscopy (N/A, 10/15/1994); Posterior laminectomy / decompression cervical spine (N/A, 1984); Posterior laminectomy / decompression cervical spine (N/A, 1985); Tonsillectomy (Bilateral, 1958); Coronary angioplasty with stent (Left, 12/04/2004); LEFT HEART CATH AND CORONARY ANGIOGRAPHY (Left, 02/19/2008); LEFT HEART CATH AND CORONARY ANGIOGRAPHY (Left, 04/18/1987); LEFT HEART CATH AND CORONARY ANGIOGRAPHY (Left, 02/03/2002); and Pain pump removal (N/A, 12/20/2021). Family: family history includes Emphysema in his father and sister; Heart attack in his father; Heart disease in his brother and mother.  Laboratory Chemistry Profile   Renal Lab Results  Component Value Date   BUN 10 01/30/2022   CREATININE 1.00 03/12/2023   BCR 11 01/30/2022   GFRAA >60 03/02/2015   GFRNONAA >60 11/01/2021    Hepatic Lab Results  Component Value Date   AST 31 01/30/2022   ALBUMIN 4.0 01/30/2022   ALKPHOS 96 01/30/2022    Electrolytes Lab Results  Component Value Date   NA 145 (H) 01/30/2022   K 5.2  01/30/2022   CL 104 01/30/2022   CALCIUM 9.6 01/30/2022   MG 2.3 11/01/2021   PHOS 3.7 11/01/2021    Bone Lab Results  Component Value Date   25OHVITD1 22 (L) 02/07/2021   25OHVITD2 <1.0 02/07/2021   25OHVITD3 22 02/07/2021    Inflammation (CRP: Acute Phase) (ESR: Chronic Phase) Lab Results  Component Value Date   CRP 5 01/30/2022   ESRSEDRATE 11 01/30/2022   LATICACIDVEN 0.9 10/28/2021         Note: Above Lab results reviewed.  Recent Imaging Review  DG PAIN CLINIC C-ARM 1-60 MIN NO REPORT Fluoro was used, but no Radiologist interpretation will be provided.  Please refer to "NOTES" tab for provider progress note. Note: Reviewed        Physical Exam  General appearance: Well nourished, well developed, and well hydrated. In no apparent acute distress Mental status: Alert, oriented x 3 (person, place, & time)       Respiratory: No evidence of acute respiratory distress Eyes: PERLA Vitals: BP (!) 168/103   Pulse 86   Temp (!) 97.4 F (36.3 C)   Resp 18   Ht 6\' 1"  (1.854 m)   Wt 190 lb (86.2 kg)   SpO2 99%   BMI 25.07 kg/m  BMI: Estimated body mass index is 25.07 kg/m as calculated from the following:   Height as of this encounter: 6\' 1"  (1.854 m).   Weight as of this encounter: 190 lb (86.2 kg). Ideal: Ideal body weight: 79.9 kg (176 lb 2.4 oz) Adjusted ideal body weight: 82.4 kg (181 lb 11 oz)  Assessment   Diagnosis Status  1. Cervicalgia   2. Cervical facet pain   3. Cervical facet hypertrophy (Multilevel) (Bilateral)   4. Cervico-occipital neuralgia (Right)  5. Pain of both shoulder joints   6. Radicular pain of shoulder   7. Postop check    Controlled Controlled Controlled   Updated Problems: Problem  Exposure to Potentially Hazardous Substance   Nov 07, 2022 Entered By: Lourdes Roy Comment: Entered automatically through [facility] Problem List documentation program  Nov 07, 2022 Entered By: Lourdes Roy Comment: Entered automatically through [facility]  Problem List documentation program   Arteriosclerosis of Coronary Artery   Jul 12, 2005 Entered By: Henery Locket Comment: 12-04-04 [place] Sirolimus drug-eluting stent   Solitary Pulmonary Nodule   Mar 14, 2017 Entered By: [facility] Comment: Enrolled in Lung Cancer Screening Program  Mar 14, 2017 Entered By: Marlena Sima Comment: Enrolled in Lung Cancer Screening Program   Primary Hypertension    Plan of Care  Problem-specific:  Assessment and Plan    Neck pain   Chronic neck pain radiates to the shoulders, worsened by improper sleeping posture. A previous epidural provided some relief, but symptoms have returned without radiation to the arms or hands. Pain is more severe than shoulder pain. Proceed with a second epidural injection. Ensure proper sleeping posture with appropriate pillow support to maintain cervical spine alignment. Discuss the option of up to three epidural injections, spaced two weeks apart, to manage symptoms. Emphasize early intervention to reduce swelling and improve treatment efficacy.  Shoulder pain   Bilateral shoulder pain, exacerbated by side sleeping, affects both shoulders and upper arms, with some radiation to the elbows. Pain originates from the shoulder joints rather than the neck. Consider further evaluation if symptoms persist or worsen.  Pain in legs and back   Chronic pain in the legs and back.  Tremors   Discuss potential causes of tremors, including physiological tremor. Refer to a neurologist for evaluation.       Mr. ETIENNE STEDMAN has a current medication list which includes the following long-term medication(s): albuterol , amitriptyline , azelastine, duloxetine , hydromorphone , [START ON 12/12/2023] hydromorphone , levothyroxine , loratadine, losartan , mexiletine, simvastatin , hydromorphone , and pregabalin .  Pharmacotherapy (Medications Ordered): No orders of the defined types were placed in this  encounter.  Orders:  Orders Placed This Encounter  Procedures   Cervical Epidural Injection    Sedation: Patient's choice. Purpose: Therapeutic Indication(s): Radiculitis and cervicalgia associater with cervical degenerative disc disease.    Standing Status:   Future    Expiration Date:   02/17/2024    Scheduling Instructions:     Procedure: Cervical Epidural Steroid Injection/Block     Level(s): C7-T1     Laterality: Left-sided     Timeframe: As soon as schedule allows.    Where will this procedure be performed?:   [facility]             by Dr. Barth Borne   Nursing Instructions:    Please complete this patient's postprocedure evaluation.    Scheduling Instructions:     Please complete this patient's postprocedure evaluation.   Follow-up plan:   Return for (ECT): (L) CESI #2.     Interventional Therapies  Risk Factors  Complex Considerations:  T2IDDM  CAD  CHF  COPD  Hx. Nephrolithiasis  A-Fib  HTN  IBS  Hx. NSTEMI MI  Hx DVT  Depression  Anxiety    Planned  Pending:   Diagnostic/Therapeutic left CESI #1    Under consideration:   Therapeutic/palliative left L4-5 LESI #1  Therapeutic left caudal ESI #2  Therapeutic bilateral lower extremity [place]  treatment #1    Completed:   Therapeutic bilateral lower  extremity Q[place]za  treatment x1 (07/10/2023) (0/0/0/0) (Do not repeat) Diagnostic/therapeutic bilateral lumbar facet MBB x2 (05/08/2023) (100/0/0/0) Diagnostic left caudal ESI and epidurogram x1 (04/24/2021) (LBP: 50/50/0  LEP; 100/100/50-60)  Diagnostic intrathecal pump catheter test/evaluation x1 (06/21/2021) (Dx: Catheter malfunction/dysfunction)  Intrathecal catheter and pump removal by me (12/20/2021) (2ry to end of battery life & catheter malfunction)    Completed by other providers:   Diagnostic EMG/PNCV (LE) (05/22/2021) by Dr. Devora Folks [facility]) Dx: Electrodiagnostic evidence of generalized sensorimotor polyneuropathy.  Unable to rule out  bilateral L5 chronic radiculopathies. Referral (02/07/2021 & 09/19/2021) to [facility] neurosurgery Berta Brittle, MD) for ITP replacement.  Cardiology clearance to Dr. Beau Bound requested (09/19/2021)    Therapeutic  Palliative (PRN) options:   None established   Pharmacotherapy  Nonopioids transferred (03/20/2022): Pregabalin  (Lyrica ) 75 mg TID.      Recent Visits Date Type Provider Dept  10/28/23 Procedure visit Renaldo Caroli, MD [facility]  10/13/23 Office Visit Renaldo Caroli, MD [facility]  10/06/23 Office Visit Renaldo Caroli, MD [facility]  Showing recent visits within past 90 days and meeting all other requirements Today's Visits Date Type Provider Dept  11/18/23 Office Visit Renaldo Caroli, MD [facility]  Showing today's visits and meeting all other requirements Future Appointments Date Type Provider Dept  01/07/24 Appointment Renaldo Caroli, MD [facility]  Showing future appointments within next 90 days and meeting all other requirements  I discussed the assessment and treatment plan with the patient. The patient was provided an opportunity to ask questions and all were answered. The patient agreed with the plan and demonstrated an understanding of the instructions.  Patient advised to call back or seek an in-person evaluation if the symptoms or condition worsens.  Duration of encounter: 30 minutes.  Total time on encounter, as per AMA guidelines included both the face-to-face and non-face-to-face time personally spent by the physician and/or other qualified health care professional(s) on the day of the encounter (includes time in activities that require the physician or other qualified health care professional and does not include time in activities normally performed by clinical staff). Physician's time may include the following activities when performed: Preparing to see the patient (e.g., pre-charting  review of records, searching for previously ordered imaging, lab work, and nerve conduction tests) Review of prior analgesic pharmacotherapies. Reviewing PMP Interpreting ordered tests (e.g., lab work, imaging, nerve conduction tests) Performing post-procedure evaluations, including interpretation of diagnostic procedures Obtaining and/or reviewing separately obtained history Performing a medically appropriate examination and/or evaluation Counseling and educating the patient/family/caregiver Ordering medications, tests, or procedures Referring and communicating with other health care professionals (when not separately reported) Documenting clinical information in the electronic or other health record Independently interpreting results (not separately reported) and communicating results to the patient/ family/caregiver Care coordination (not separately reported)  Note by: Candi Chafe, MD (TTS and AI technology used. I apologize for any typographical errors that were not detected and corrected.) Date: 11/18/2023; Time: 3:36 PM

## 2023-11-18 NOTE — Patient Instructions (Signed)

## 2023-11-18 NOTE — Progress Notes (Signed)
 Safety precautions to be maintained throughout the outpatient stay will include: orient to surroundings, keep bed in low position, maintain call bell within reach at all times, provide assistance with transfer out of bed and ambulation.

## 2023-11-19 ENCOUNTER — Ambulatory Visit: Payer: Medicare (Managed Care) | Admitting: Urology

## 2023-11-19 DIAGNOSIS — Z8551 Personal history of malignant neoplasm of bladder: Secondary | ICD-10-CM

## 2023-11-19 DIAGNOSIS — R310 Gross hematuria: Secondary | ICD-10-CM

## 2023-12-09 ENCOUNTER — Encounter (INDEPENDENT_AMBULATORY_CARE_PROVIDER_SITE_OTHER): Payer: Self-pay

## 2023-12-23 ENCOUNTER — Ambulatory Visit: Payer: Medicare (Managed Care) | Admitting: Urology

## 2023-12-30 NOTE — Progress Notes (Unsigned)
  Electrophysiology Office Follow up Visit Note:    Date:  12/31/2023   ID:  [name], DOB [birth year], MRN [id number]  PCP:  Melchor Spoon, MD  [facility] Cardiologist:  None  [facility] Electrophysiologist:  Gabriel Byes, MD    Interval History:     [name] is a 78 y.o. male who presents for a follow up visit.   He was last seen by me 06/23/2023 for PVC's. He is on mexiletine. He also has AF on eliquis .   He is with his wife today.  He reports not being on the Eliquis  for several months now.  He originally thought that I previously told him to stop the medication.  I showed him clinic notes with me encouraging him to stay on the blood thinner given his history of atrial fibrillation.  He previously tolerated the medicine.      Past medical, surgical, social and family history were reviewed.  ROS:   Please see the history of present illness.    All other systems reviewed and are negative.  EKGs/Labs/Other Studies Reviewed:    The following studies were reviewed today:     EKG Interpretation Date/Time:  Wednesday December 31 2023 13:58:44 EDT Ventricular Rate:  70 PR Interval:  188 QRS Duration:  122 QT Interval:  424 QTC Calculation: 457 R Axis:   -48  Text Interpretation: Sinus rhythm with occasional Premature ventricular complexes Left anterior fascicular block Confirmed by [name] [phone]) on 12/31/2023 2:06:48 PM    Physical Exam:    VS:  BP 120/68 (BP Location: Left Arm)   Pulse 70   Ht 6' 1 (1.854 m)   Wt 196 lb (88.9 kg)   SpO2 96%   BMI 25.86 kg/m     Wt Readings from Last 3 Encounters:  12/31/23 196 lb (88.9 kg)  11/18/23 190 lb (86.2 kg)  10/28/23 190 lb (86.2 kg)     GEN: no distress CARD: RRR, No MRG RESP: No IWOB. CTAB.      ASSESSMENT:    1. PVC's (premature ventricular contractions)   2. Paroxysmal atrial fibrillation (HCC)    PLAN:    In order of problems listed above:  #Frequent PVC's Cont  mexiletine  #AF Cont eliquis .  We will send in a new prescription to the pharmacy.  #Coronary artery disease Continue aspirin .  No ischemic symptoms today.   Follow up 6 mo w APP.     Signed, Gabriel Liner, MD, [facility], [facility] 12/31/2023 2:20 PM    Electrophysiology [facility]

## 2023-12-31 ENCOUNTER — Ambulatory Visit: Payer: Medicare (Managed Care) | Attending: Cardiology | Admitting: Cardiology

## 2023-12-31 VITALS — BP 120/68 | HR 70 | Ht 73.0 in | Wt 196.0 lb

## 2023-12-31 DIAGNOSIS — I493 Ventricular premature depolarization: Secondary | ICD-10-CM

## 2023-12-31 DIAGNOSIS — I480 Paroxysmal atrial fibrillation: Secondary | ICD-10-CM | POA: Diagnosis not present

## 2023-12-31 MED ORDER — APIXABAN 5 MG PO TABS: 5.0000 mg | ORAL_TABLET | Freq: Two times a day (BID) | ORAL | 3 refills | Status: AC

## 2023-12-31 NOTE — Patient Instructions (Signed)
 Medication Instructions:  Your physician has recommended you make the following change in your medication:  1) RESTART Eliquis  5 mg twice daily  *If you need a refill on your cardiac medications before your next appointment, please call your pharmacy*  Follow-Up: At [facility], you and your health needs are our priority.  As part of our continuing mission to provide you with exceptional heart care, our providers are all part of one team.  This team includes your primary Cardiologist (physician) and Advanced Practice Providers or APPs (Physician Assistants and Nurse Practitioners) who all work together to provide you with the care you need, when you need it.  Your next appointment:   6 months  Provider:   You will see one of the following Advanced Practice Providers on your designated Care Team:   Mertha Abrahams, PA-C Michael Andy Tillery, PA-C Suzann Riddle, NP Creighton Doffing, NP

## 2024-01-07 ENCOUNTER — Encounter: Payer: Self-pay | Admitting: Nurse Practitioner

## 2024-01-07 ENCOUNTER — Ambulatory Visit: Payer: Medicare (Managed Care) | Attending: Nurse Practitioner | Admitting: Nurse Practitioner

## 2024-01-07 ENCOUNTER — Encounter: Payer: Medicare (Managed Care) | Admitting: Pain Medicine

## 2024-01-07 DIAGNOSIS — M79605 Pain in left leg: Secondary | ICD-10-CM | POA: Insufficient documentation

## 2024-01-07 DIAGNOSIS — M79671 Pain in right foot: Secondary | ICD-10-CM | POA: Diagnosis present

## 2024-01-07 DIAGNOSIS — M25562 Pain in left knee: Secondary | ICD-10-CM | POA: Diagnosis present

## 2024-01-07 DIAGNOSIS — M79604 Pain in right leg: Secondary | ICD-10-CM | POA: Insufficient documentation

## 2024-01-07 DIAGNOSIS — M79672 Pain in left foot: Secondary | ICD-10-CM | POA: Diagnosis present

## 2024-01-07 DIAGNOSIS — Z79899 Other long term (current) drug therapy: Secondary | ICD-10-CM | POA: Insufficient documentation

## 2024-01-07 DIAGNOSIS — M5441 Lumbago with sciatica, right side: Secondary | ICD-10-CM | POA: Insufficient documentation

## 2024-01-07 DIAGNOSIS — G894 Chronic pain syndrome: Secondary | ICD-10-CM | POA: Diagnosis present

## 2024-01-07 DIAGNOSIS — M961 Postlaminectomy syndrome, not elsewhere classified: Secondary | ICD-10-CM | POA: Diagnosis present

## 2024-01-07 DIAGNOSIS — M25561 Pain in right knee: Secondary | ICD-10-CM | POA: Diagnosis present

## 2024-01-07 DIAGNOSIS — M5442 Lumbago with sciatica, left side: Secondary | ICD-10-CM | POA: Diagnosis present

## 2024-01-07 DIAGNOSIS — Z79891 Long term (current) use of opiate analgesic: Secondary | ICD-10-CM | POA: Insufficient documentation

## 2024-01-07 DIAGNOSIS — G8929 Other chronic pain: Secondary | ICD-10-CM | POA: Insufficient documentation

## 2024-01-07 MED ORDER — HYDROMORPHONE HCL 4 MG PO TABS: 4.0000 mg | ORAL_TABLET | Freq: Three times a day (TID) | ORAL | 0 refills | Status: DC | PRN

## 2024-01-07 NOTE — Progress Notes (Signed)
 Safety precautions to be maintained throughout the outpatient stay will include: orient to surroundings, keep bed in low position, maintain call bell within reach at all times, provide assistance with transfer out of bed and ambulation.   Nursing Pain Medication Assessment:  Safety precautions to be maintained throughout the outpatient stay will include: orient to surroundings, keep bed in low position, maintain call bell within reach at all times, provide assistance with transfer out of bed and ambulation.  Medication Inspection Compliance: Pill count conducted under aseptic conditions, in front of the patient. Neither the pills nor the bottle was removed from the patient's sight at any time. Once count was completed pills were immediately returned to the patient in their original bottle.  Medication: Hydromorphone  (Dilaudid ) Pill/Patch Count: 42 of 90 pills/patches remain Pill/Patch Appearance: Markings consistent with prescribed medication Bottle Appearance: Standard pharmacy container. Clearly labeled. Filled Date: 5 / 30 / 2025 Last Medication intake:  Today

## 2024-01-07 NOTE — Progress Notes (Signed)
 PROVIDER NOTE: Interpretation of information contained herein should be left to medically-trained personnel. Specific patient instructions are provided elsewhere under Patient Instructions section of medical record. This document was created in part using AI and STT-dictation technology, any transcriptional errors that may result from this process are unintentional.  Patient: [name]  Service: E/M   PCP: Melchor Spoon, MD  DOB: April 30, 1946  DOS: 01/07/2024  Provider: Cherylin Corrigan, NP  MRN: [id number]  Delivery: Face-to-face  Specialty: Interventional Pain Management  Type: Established Patient  Setting: Ambulatory outpatient facility  Specialty designation: 09  Referring Prov.: Melchor Spoon, MD  Location: Outpatient office facility       History of present illness (HPI) [name], a 78 y.o. year old male, is here today because of his No primary diagnosis found.. Mr. [name] primary complain today is Back Pain (Lower back and bilateral legs, neck and both shoulders)   Pain Assessment: Severity of Chronic pain is reported as a 9 /10. Location: Back Lower/radiates down both legs to bottom of feet; neck and both shoulders. Onset: More than a month ago. Quality: Aching, Burning, Stabbing, Dull. Timing: Constant. Modifying factor(s): meds, heat. Vitals:  height is 6' 1 (1.854 m) and weight is 197 lb (89.4 kg). His temperature is 97.2 F (36.2 C) (abnormal). His blood pressure is 129/76 and his pulse is 78. His oxygen saturation is 94%.  BMI: Estimated body mass index is 25.99 kg/m as calculated from the following:   Height as of this encounter: 6' 1 (1.854 m).   Weight as of this encounter: 197 lb (89.4 kg).  Last encounter: 11/18/2023 Last procedure: Visit date not found.  Reason for encounter: medication management.  The patient indicates doing well with current medication regimen.  No adverse reaction or side effects reported to medication.   He experienced pain mainly in  his lower back with radiation down to his legs and also in neck pain.  The pain in his shoulders becomes particularly intense when he sleeps on his sides, often causing him to wake up in the middle of the night due to discomfort. Pharmacotherapy Assessment   Analgesic: Hydromorphone  (Dilaudid ) 4 mg tablet every 8 hours as needed for pain. MME=60 Monitoring: [place] PMP: PDMP reviewed during this encounter.       Pharmacotherapy: No side-effects or adverse reactions reported. Compliance: No problems identified. Effectiveness: Clinically acceptable.  Merilyn Staple, RN  01/07/2024  1:58 PM  Sign when Signing Visit Safety precautions to be maintained throughout the outpatient stay will include: orient to surroundings, keep bed in low position, maintain call bell within reach at all times, provide assistance with transfer out of bed and ambulation.   Nursing Pain Medication Assessment:  Safety precautions to be maintained throughout the outpatient stay will include: orient to surroundings, keep bed in low position, maintain call bell within reach at all times, provide assistance with transfer out of bed and ambulation.  Medication Inspection Compliance: Pill count conducted under aseptic conditions, in front of the patient. Neither the pills nor the bottle was removed from the patient's sight at any time. Once count was completed pills were immediately returned to the patient in their original bottle.  Medication: Hydromorphone  (Dilaudid ) Pill/Patch Count: 42 of 90 pills/patches remain Pill/Patch Appearance: Markings consistent with prescribed medication Bottle Appearance: Standard pharmacy container. Clearly labeled. Filled Date: 5 / 30 / 2025 Last Medication intake:  Today  UDS:  Summary  Date Value Ref Range Status  10/13/2023 FINAL  Final    Comment:    ==================================================================== [employer] Select 13  (MW) ==================================================================== Test                             Result       Flag       Units  Drug Present and Declared for Prescription Verification   Hydromorphone                   9655         EXPECTED   ng/mg creat    Hydromorphone  may be administered as a scheduled prescription    medication; it is also an expected metabolite of hydrocodone.  ==================================================================== Test                      Result    Flag   Units      Ref Range   Creatinine              51               mg/dL      >=36 ==================================================================== Declared Medications:  The flagging and interpretation on this report are based on the  following declared medications.  Unexpected results may arise from  inaccuracies in the declared medications.   **Note: The testing scope of this panel includes these medications:   Hydromorphone  (Dilaudid )   **Note: The testing scope of this panel does not include the  following reported medications:   Albuterol  (Ventolin  HFA)  Amitriptyline  (Elavil )  Aspirin   Azelastine (Astelin)  Bisacodyl   Clonidine  (Catapres )  Duloxetine  (Cymbalta )  Levothyroxine  (Synthroid )  Loratadine (Claritin)  Losartan  (Cozaar )  Mexiletine (Mexitil)  Naloxone  (Narcan )  Pregabalin  (Lyrica )  Simvastatin  (Zocor ) ==================================================================== For clinical consultation, please call [phone]. ====================================================================     No results found for: CBDTHCR No results found for: D8THCCBX No results found for: D9THCCBX  ROS  Constitutional: Denies any fever or chills Gastrointestinal: No reported hemesis, hematochezia, vomiting, or acute GI distress Musculoskeletal: Low back pain radiate down to bilateral legs, bilateral shoulder pain, neck pain Neurological: No reported episodes  of acute onset apraxia, aphasia, dysarthria, agnosia, amnesia, paralysis, loss of coordination, or loss of consciousness  Medication Review  DULoxetine , HYDROmorphone , albuterol , amitriptyline , apixaban , aspirin  EC, azelastine, bisacodyl , cloNIDine , levothyroxine , loratadine, losartan , mexiletine, naloxone , pregabalin , and simvastatin   History Review  Allergy: Mr. Roussel is allergic to celebrex [celecoxib], codeine, penicillins, and entresto  [sacubitril-valsartan]. Drug: Mr. Thain  reports no history of drug use. Alcohol:  reports no history of alcohol use. Tobacco:  reports that he has been smoking cigarettes. He has a 44.3 pack-year smoking history. He has never used smokeless tobacco. Social: Mr. Obremski  reports that he has been smoking cigarettes. He has a 44.3 pack-year smoking history. He has never used smokeless tobacco. He reports that he does not drink alcohol and does not use drugs. Medical:  has a past medical history of Acute anterolateral wall MI (HCC) (2000), Acute non-Q wave non-ST elevation myocardial infarction (NSTEMI) (HCC) (12/03/2004), Adenomatous polyp of colon, Agent orange exposure, Angina pectoris (HCC), Anxiety, Aortic atherosclerosis (HCC), Asthma, Bladder stones, Bradycardia, CHF (congestive heart failure) (HCC), Chronic lower back pain, Chronic pain syndrome, COPD (chronic obstructive pulmonary disease) (HCC), Coronary artery disease, Depression, DJD (degenerative joint disease), DVT of lower extremity, bilateral (HCC), Dyspnea, GERD (gastroesophageal reflux disease), History of hiatal hernia, HLD (hyperlipidemia), Hypertension, Hypothyroidism, Inflammatory polyarthropathy (HCC), Internal hemorrhoids,  Long term prescription opiate use, 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 Surgical: Mr. Blowe  has a past surgical history that includes Coronary angioplasty; Posterior lumbar fusion (N/A, 1999); Nissen fundoplication (N/A, 2000); Mastoidectomy; Partial colectomy (N/A); Intrathecal pump implantation (N/A, 03/2008); Intrathecal pump revision (N/A, 09/28/2014); Transurethral resection of bladder tumor (N/A, 03/07/2015); Colonoscopy with propofol  (N/A, 02/27/2018); External ear surgery; Skin cancer excision; IVC FILTER PLACEMENT ([facility] HX) (N/A, 09/2007); IVC FILTER REMOVAL (N/A, 2011); Colonoscopy (N/A, 11/22/2013); Colonoscopy (N/A, 09/19/2008); Colonoscopy (N/A, 08/25/2003); Colonoscopy (N/A, 10/15/1994); Posterior laminectomy / decompression cervical spine (N/A, 1984); Posterior laminectomy / decompression cervical spine (N/A, 1985); Tonsillectomy (Bilateral, 1958); Coronary angioplasty with stent (Left, 12/04/2004); LEFT HEART CATH AND CORONARY ANGIOGRAPHY (Left, 02/19/2008); LEFT HEART CATH AND CORONARY ANGIOGRAPHY (Left, 04/18/1987); LEFT HEART CATH AND CORONARY ANGIOGRAPHY (Left, 02/03/2002); and Pain pump removal (N/A, 12/20/2021). Family: family history includes Emphysema in his father and sister; Heart attack in his father; Heart disease in his brother and mother.  Laboratory Chemistry Profile   Renal Lab Results  Component Value Date   BUN 10 01/30/2022   CREATININE 1.00 03/12/2023   BCR 11 01/30/2022   GFRAA >60 03/02/2015   GFRNONAA >60 11/01/2021    Hepatic Lab Results  Component Value Date   AST 31 01/30/2022   ALBUMIN 4.0 01/30/2022   ALKPHOS 96 01/30/2022    Electrolytes Lab Results  Component Value Date   NA 145 (H) 01/30/2022   K 5.2 01/30/2022   CL 104 01/30/2022   CALCIUM 9.6  01/30/2022   MG 2.3 11/01/2021   PHOS 3.7 11/01/2021    Bone Lab Results  Component Value Date   25OHVITD1 22 (L) 02/07/2021   25OHVITD2 <1.0 02/07/2021   25OHVITD3 22 02/07/2021    Inflammation (CRP: Acute Phase) (ESR: Chronic Phase) Lab Results  Component Value Date   CRP 5 01/30/2022   ESRSEDRATE 11 01/30/2022   LATICACIDVEN 0.9 10/28/2021         Note: Above Lab results reviewed.  Recent Imaging Review  DG PAIN CLINIC C-ARM 1-60 MIN NO REPORT Fluoro was used, but no Radiologist interpretation will be provided.  Please refer to NOTES tab for provider progress note. Note: Reviewed        Physical Exam  General appearance: Well nourished, well developed, and well hydrated. In no apparent acute distress Mental status: Alert, oriented x 3 (person, place, & time)       Respiratory: No evidence of acute respiratory distress Eyes: PERLA Vitals: BP 129/76   Pulse 78   Temp (!) 97.2 F (36.2 C)   Ht 6' 1 (1.854 m)   Wt 197 lb (89.4 kg)   SpO2 94%   BMI 25.99 kg/m  BMI: Estimated body mass index is 25.99 kg/m as calculated from the following:   Height as of this encounter: 6' 1 (1.854 m).   Weight as of this encounter: 197 lb (89.4 kg). Ideal: Ideal body weight: 79.9 kg (176 lb 2.4 oz) Adjusted ideal body weight: 83.7 kg (184 lb 7.8 oz)  Assessment   Diagnosis Status  1. Chronic feet pain (3ry area of Pain) (Bilateral) (R>L)   2. 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 Chronic knee pain (4th area of Pain) (Bilateral) (R>L)   6. Failed back surgical syndrome   7. Chronic pain syndrome   8. Pharmacologic therapy   9. Chronic use of opiate for therapeutic purpose   10. Encounter for medication management   11. Encounter for chronic pain management    Controlled Controlled Controlled    Updated Problems: No problems updated.  Plan of Care  Problem-specific:  Assessment and Plan We will continue on current medication regimen.  Prescribing drug monitoring (PDMP) reviewed; findings consistent with the use of prescribed medication and no evidence of narcotic misuse or abuse.  No other new issues or problems reported at this visit.  Schedule follow-up in 90 days for medication management.  Mr. ODAY RIDINGS has a current medication list which includes the following long-term medication(s): albuterol , amitriptyline , apixaban , azelastine, duloxetine , levothyroxine , loratadine, losartan , mexiletine, pregabalin , simvastatin , [START ON 01/18/2024] hydromorphone , [START ON 02/17/2024] hydromorphone , and [START ON 03/18/2024] hydromorphone .  Pharmacotherapy (Medications Ordered): Meds ordered this encounter  Medications   HYDROmorphone  (DILAUDID ) 4 MG tablet    Sig: Take 1 tablet (4 mg total) by mouth every 8 (eight) hours as needed. Must last 30 days.    Dispense:  90 tablet    Refill:  0    DO NOT: delete (not duplicate); no partial-fill (will deny script to complete), no refill request (F/U required). DISPENSE: 1 day early if closed on fill date. WARN: No CNS-depressants within 8 hrs of med.   HYDROmorphone  (DILAUDID ) 4 MG tablet    Sig: Take 1 tablet (4 mg total) by mouth every 8 (eight) hours as needed. Must last 30 days.    Dispense:  90 tablet    Refill:  0    DO NOT: delete (not duplicate); no partial-fill (will deny script to complete), no refill request (F/U required). DISPENSE: 1 day early if closed on fill date. WARN: No CNS-depressants within 8 hrs of med.   HYDROmorphone  (DILAUDID ) 4 MG tablet    Sig: Take 1 tablet (4 mg total) by mouth every 8 (eight) hours as needed. Must last 30 days.    Dispense:  90 tablet    Refill:  0    DO NOT: delete (not duplicate); no partial-fill (will deny script to complete), no refill request (F/U required). DISPENSE: 1 day early if  closed on fill date. WARN: No CNS-depressants within 8 hrs of med.   Orders:  No orders of the defined types were placed in this encounter.       Return in about 3 months (around 04/08/2024) for (F2F), (MM), Marthe Slain NP.    Recent Visits Date Type Provider Dept  11/18/23 Office Visit Renaldo Caroli, MD [facility]  10/28/23 Procedure visit Renaldo Caroli, MD [facility]  10/13/23 Office Visit Renaldo Caroli, MD [facility]  Showing recent visits within past 90 days and meeting all other requirements Today's Visits Date Type Provider Dept  01/07/24 Office Visit Sruti Ayllon K, NP [facility]  Showing today's visits and meeting all other requirements Future Appointments Date Type Provider Dept  04/05/24 Appointment Jourdan Durbin K, NP [facility]  Showing future appointments within next 90 days and meeting all other requirements  I discussed the assessment and treatment plan with the patient. The patient  was provided an opportunity to ask questions and all were answered. The patient agreed with the plan and demonstrated an understanding of the instructions.  Patient advised to call back or seek an in-person evaluation if the symptoms or condition worsens.  Duration of encounter: 30 minutes.  Total time on encounter, as per AMA guidelines included both the face-to-face and non-face-to-face time personally spent by the physician and/or other qualified health care professional(s) on the day of the encounter (includes time in activities that require the physician or other qualified health care professional and does not include time in activities normally performed by clinical staff). Physician's time may include the following activities when performed: Preparing to see the patient (e.g., pre-charting review of records, searching for previously ordered imaging, lab work, and nerve conduction tests) Review of prior analgesic  pharmacotherapies. Reviewing PMP Interpreting ordered tests (e.g., lab work, imaging, nerve conduction tests) Performing post-procedure evaluations, including interpretation of diagnostic procedures Obtaining and/or reviewing separately obtained history Performing a medically appropriate examination and/or evaluation Counseling and educating the patient/family/caregiver Ordering medications, tests, or procedures Referring and communicating with other health care professionals (when not separately reported) Documenting clinical information in the electronic or other health record Independently interpreting results (not separately reported) and communicating results to the patient/ family/caregiver Care coordination (not separately reported)  Note by: Kenner Lewan K Deette Revak, NP (TTS and AI technology used. I apologize for any typographical errors that were not detected and corrected.) Date: 01/07/2024; Time: 2:35 PM

## 2024-01-13 ENCOUNTER — Telehealth: Payer: Self-pay | Admitting: *Deleted

## 2024-01-13 NOTE — Telephone Encounter (Signed)
   Pre-operative Risk Assessment    Patient Name: ROMEL DUMOND  DOB: 01/20/1946 MRN: [id number]   Date of last office visit: 12/31/23 DR. LAMBERT Date of next office visit: NONE   Request for Surgical Clearance    Procedure:  COLONOSCOPY  Date of Surgery:  Clearance 02/18/24                                Surgeon:  DR. ADA BOSS Surgeon's Group or Practice Name:  [facility] Phone number:  [phone] Fax number:  [phone]   Type of Clearance Requested:   - Medical  - Pharmacy:  Hold Apixaban  (Eliquis )     Type of Anesthesia:  Not Indicated (PROPOFOL ?)   Additional requests/questions:    Bonney Niels Jest   01/13/2024, 4:25 PM

## 2024-01-19 NOTE — Telephone Encounter (Signed)
 Patient with diagnosis of A Fib on Eliquis  for anticoagulation.    Procedure: Colonoscopy Date of procedure: 02/18/24   CHA2DS2-VASc Score = 6  This indicates a 9.7% annual risk of stroke. The patient's score is based upon: CHF History: 1 HTN History: 1 Diabetes History: 1 Stroke History: 0 Vascular Disease History: 1 Age Score: 2 Gender Score: 0    CrCl 88 ml/min Platelet count 260K   Per office protocol, patient can hold Eliquis  for 2 days prior to procedure.    **This guidance is not considered finalized until pre-operative APP has relayed final recommendations.**

## 2024-01-19 NOTE — Telephone Encounter (Signed)
   Patient Name: [name]  DOB: Dec 08, 1945 MRN: [id number]  Primary Cardiologist: None  Chart reviewed as part of pre-operative protocol coverage. Given past medical history and time since last visit, based on ACC/AHA guidelines, [name] is at acceptable risk for the planned procedure without further cardiovascular testing.   Per office protocol, patient can hold Eliquis  for 2 days prior to procedure.    The patient was advised that if he develops new symptoms prior to surgery to contact our office to arrange for a follow-up visit, and he verbalized understanding.  I will route this recommendation to the requesting party via Epic fax function and remove from pre-op pool.  Please call with questions.  Wyn Raddle, Jackee Shove, NP 01/19/2024, 9:50 AM

## 2024-01-22 ENCOUNTER — Ambulatory Visit: Payer: Medicare (Managed Care) | Admitting: Urology

## 2024-02-11 NOTE — Telephone Encounter (Signed)
 Requesting office inquiring if pt has been cleared. I reviewed the chart which does seem to reflect the pt has been cleared. I am going to forward to the preop APP as well to confirm if pt has been cleared.   I tried to call the requesting office however they were closed already.

## 2024-02-18 ENCOUNTER — Ambulatory Visit
Admission: RE | Admit: 2024-02-18 | Payer: Medicare (Managed Care) | Source: Home / Self Care | Admitting: Internal Medicine

## 2024-02-18 SURGERY — COLONOSCOPY
Anesthesia: General

## 2024-02-20 ENCOUNTER — Ambulatory Visit: Payer: Medicare (Managed Care) | Admitting: Urology

## 2024-02-20 ENCOUNTER — Encounter: Payer: Self-pay | Admitting: Urology

## 2024-02-20 ENCOUNTER — Telehealth: Payer: Self-pay | Admitting: Cardiology

## 2024-02-20 VITALS — BP 107/65 | HR 82 | Ht 73.0 in | Wt 197.0 lb

## 2024-02-20 DIAGNOSIS — R310 Gross hematuria: Secondary | ICD-10-CM | POA: Diagnosis not present

## 2024-02-20 DIAGNOSIS — Z8551 Personal history of malignant neoplasm of bladder: Secondary | ICD-10-CM | POA: Diagnosis not present

## 2024-02-20 LAB — URINALYSIS, COMPLETE
Bilirubin, UA: NEGATIVE
Glucose, UA: NEGATIVE
Ketones, UA: NEGATIVE
Nitrite, UA: NEGATIVE
Protein,UA: NEGATIVE
RBC, UA: NEGATIVE
Specific Gravity, UA: <1.005 — ABNORMAL LOW (ref 1.005–1.030)
Urobilinogen, Ur: 0.2 mg/dL (ref 0.2–1.0)
pH, UA: 6.5 (ref 5.0–7.5)

## 2024-02-20 LAB — MICROSCOPIC EXAMINATION
Epithelial Cells (non renal): >10 /HPF — AB (ref 0–10)
RBC, Urine: NONE SEEN /HPF (ref 0–2)

## 2024-02-20 NOTE — Telephone Encounter (Signed)
   Pre-operative Risk Assessment    Patient Name: [name]  DOB: 12-28-1945 MRN: [id number]   Date of last office visit: 12/31/23 Date of next office visit: n//a  Request for Surgical Clearance    Procedure:  Dental Extraction - Amount of Teeth to be Pulled:  26 (all) teeth to be removed and bone recontouring   Date of Surgery:  Clearance TBD                                Surgeon:  Dr Comer Molt Surgeon's Group or Practice Name:  [facility] Phone number:  [phone] Fax number:  [phone]   Type of Clearance Requested:   - Medical    Type of Anesthesia:  4% articaine 1:100,000 with epinephrine    Additional requests/questions:  Please advise surgeon/provider what medications should be held.  Bonney Rosina Stamps   02/20/2024, 2:33 PM

## 2024-02-20 NOTE — Patient Instructions (Signed)
 Scheduling number:  [phone]  Cystoscopy Cystoscopy is a procedure that is used to help diagnose and sometimes treat conditions that affect the lower urinary tract. The lower urinary tract includes the bladder and the urethra. The urethra is the tube that drains urine from the bladder. Cystoscopy is done using a thin, tube-shaped instrument with a light and camera at the end (cystoscope). The cystoscope may be hard or flexible, depending on the goal of the procedure. The cystoscope is inserted through the urethra, into the bladder. Cystoscopy may be recommended if you have: Urinary tract infections that keep coming back. Blood in the urine (hematuria). An inability to control when you urinate (urinary incontinence) or an overactive bladder. Unusual cells found in a urine sample. A blockage in the urethra, such as a urinary stone. Painful urination. An abnormality in the bladder found during an intravenous pyelogram (IVP) or CT scan. What are the risks? Generally, this is a safe procedure. However, problems may occur, including: Infection. Bleeding.  What happens during the procedure?  You will be given one or more of the following: A medicine to numb the area (local anesthetic). The area around the opening of your urethra will be cleaned. The cystoscope will be passed through your urethra into your bladder. Germ-free (sterile) fluid will flow through the cystoscope to fill your bladder. The fluid will stretch your bladder so that your health care provider can clearly examine your bladder walls. Your doctor will look at the urethra and bladder. The cystoscope will be removed The procedure may vary among health care providers  What can I expect after the procedure? After the procedure, it is common to have: Some soreness or pain in your urethra. Urinary symptoms. These include: Mild pain or burning when you urinate. Pain should stop within a few minutes after you urinate. This may  last for up to a few days after the procedure. A small amount of blood in your urine for several days. Feeling like you need to urinate but producing only a small amount of urine. Follow these instructions at home: General instructions Return to your normal activities as told by your health care provider.  Drink plenty of fluids after the procedure. Keep all follow-up visits as told by your health care provider. This is important. Contact a health care provider if you: Have pain that gets worse or does not get better with medicine, especially pain when you urinate lasting longer than 72 hours after the procedure. Have trouble urinating. Get help right away if you: Have blood clots in your urine. Have a fever or chills. Are unable to urinate. Summary Cystoscopy is a procedure that is used to help diagnose and sometimes treat conditions that affect the lower urinary tract. Cystoscopy is done using a thin, tube-shaped instrument with a light and camera at the end. After the procedure, it is common to have some soreness or pain in your urethra. It is normal to have blood in your urine after the procedure.  If you were prescribed an antibiotic medicine, take it as told by your health care provider.  This information is not intended to replace advice given to you by your health care provider. Make sure you discuss any questions you have with your health care provider. Document Revised: 06/30/2018 Document Reviewed: 06/30/2018 Elsevier Patient Education  2020 [employer].

## 2024-02-22 NOTE — Progress Notes (Signed)
 I, Maysun LITTIE Griffiths, acting as a scribe for Glendia JAYSON Barba, MD., have documented all relevant documentation on the behalf of Glendia JAYSON Barba, MD, as directed by Glendia JAYSON Barba, MD while in the presence of Glendia JAYSON Barba, MD.  02/20/2024 2:00 PM   [name] Niece July 13, 1946 [id number]  Referring provider: Fernande Ophelia JINNY DOUGLAS, MD [address] [facility] [place],  [place] [address]  Chief Complaint  Patient presents with   Establish Care    History of bladder cancer    HPI: [name] is a 78 y.o. male self-referred for recent episode of gross hematuria.  Onset total gross painless hematuria 02/18/2024. Blood was described as dark red with a few small clots. Bleeding resolved after 24 hours.  No bothersome lower urinary tract symptoms, dysuria or flank/abdominal/pelvic pain.  He is on Eliquis  for chronic atrial fibrillation.  Prior patient of Dr. Kassie. Denied any previous history of complications related to bladder cancer with Dr. Kassie. His office records from [facility] were apparently received by the hospital. The family, however have had difficulty in getting them emailed to the office.  On review of Epic, he did undergo a surgery TURBT 03/07/23 with intraoperative findings, remarkable for a 10 mm papillary bladder tumor lateral to the right UO, which was resected, and he received post-resection monomycin. 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  There are no other OP notes in Epic.   PMH: Past Medical History:  Diagnosis Date   Acute anterolateral wall MI (HCC) 2000   a.) PCI with stent x 1 to LCx (type unknown).   Acute non-Q wave non-ST elevation myocardial infarction (NSTEMI) (HCC) 12/03/2004   a.) LHC 12/04/2004: EF 50%; 75% pLAD, 30% pRCA, 30% mRCA --> PCI performed placing a 3.0 x 13 mm Cypher DES to pLAD.   Adenomatous polyp of colon    Agent orange exposure    a.) [place] War   Angina  pectoris (HCC)    Anxiety    Aortic atherosclerosis (HCC)    Asthma    Bladder stones    Bradycardia    CHF (congestive heart failure) (HCC)    Chronic lower back pain    a.) post-laminectomy syndrome   Chronic pain syndrome    a.) on COT; has naloxone  Rx in place   COPD (chronic obstructive pulmonary disease) (HCC)    Coronary artery disease    a.) LHC 04/18/1987: normal cors.  b.) MI 2000 --> PCI with stent x 1 to LCx (unknown type). c.) LHC 02/03/2002: 20% pRCA, 20% mRCA-1, 20% mRCA-2, 50% mLAD; med mgmt. d.) NSTEMI 12/04/2004 --> LHC 75% pLAD, 30% pRCA, 30% mRCA --> PCI placing a 3.0x69mm Cypher DES. e.) LHC 02/19/2008: 40 % mLAD, 25% pLCx, 30% pRCA-1, 40% pRCA-2, 40% mRCA, 10% dRCA; 10% ISR pLAD and LCx; med mgmt   Depression    DJD (degenerative joint disease)    DVT of lower extremity, bilateral (HCC)    a.) IVC filter placed 09/2007; removed 2011.   Dyspnea    GERD (gastroesophageal reflux disease)    History of hiatal hernia    a.) s/p fundoplication   HLD (hyperlipidemia)    Hypertension    Hypothyroidism    Inflammatory polyarthropathy (HCC)    Internal hemorrhoids    Long term prescription opiate use    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HCC) 10/03/2015   Neuropathy    NSVT (nonsustained ventricular tachycardia) (HCC)  12/04/2004   OSA (obstructive sleep apnea)    a.) does not utilize nocturnal PAP therapy or supplemental oxygen   Osteoarthritis    Osteoporosis    PAF (paroxysmal atrial fibrillation) (HCC)    a.) CHA2DS2-VASc = 7 (age, CHF, HTN, DVT x 2, aortic plaque, T2DM). b.) rate/rhythm maintained without pharmacological intervention; not currently on chronic anticoagulation therapy   Paralysis of RIGHT diaphragm    Peripheral vascular disease (HCC)    Rhabdomyolysis 2006   Senile purpura (HCC)    Sepsis due to pneumonia (HCC) 10/28/2021   Stasis dermatitis of both legs    T2DM (type 2 diabetes mellitus) (HCC)    Therapeutic opioid-induced  constipation (OIC)    a.) takes daily Senokot-S + bisacodyl     Surgical History: Past Surgical History:  Procedure Laterality Date   COLONOSCOPY N/A 11/22/2013   COLONOSCOPY N/A 09/19/2008   COLONOSCOPY N/A 08/25/2003   COLONOSCOPY N/A 10/15/1994   COLONOSCOPY WITH PROPOFOL  N/A 02/27/2018   Procedure: COLONOSCOPY WITH PROPOFOL ;  Surgeon: Viktoria Lamar DASEN, MD;  Location: [facility] ENDOSCOPY;  Service: Endoscopy;  Laterality: N/A;   CORONARY ANGIOPLASTY     2000   CORONARY ANGIOPLASTY WITH STENT PLACEMENT Left 12/04/2004   Procedure: CORONARY ANGIOPLASTY WITH STENT PLACEMENT; Location: [facility]; Surgeon: Wolm Rhyme, MD   EXTERNAL EAR SURGERY     x3   INTRATHECAL PUMP IMPLANTATION N/A 03/2008   INTRATHECAL PUMP REVISION N/A 09/28/2014   IVC FILTER PLACEMENT ([facility] HX) N/A 09/2007   IVC FILTER REMOVAL N/A 2011   LEFT HEART CATH AND CORONARY ANGIOGRAPHY Left 02/19/2008   Procedure: LEFT HEART CATH AND CORONARY ANGIOGRAPHY; Location: [facility]; Surgeon: Wolm Rhyme, MD   LEFT HEART CATH AND CORONARY ANGIOGRAPHY Left 04/18/1987   Procedure: LEFT HEART CATH AND CORONARY ANGIOGRAPHY; Location: [facility]; Surgeon: Lamar Coy, MD   LEFT HEART CATH AND CORONARY ANGIOGRAPHY Left 02/03/2002   Procedure: LEFT HEART CATH AND CORONARY ANGIOGRAPHY; Location: [facility]; Surgeon: Margie Lovelace, MD   MASTOIDECTOMY     x 3   NISSEN FUNDOPLICATION N/A 2000   PAIN PUMP REMOVAL N/A 12/20/2021   Procedure: PAIN PUMP REMOVAL;  Surgeon: Tanya Glisson, MD;  Location: [facility] ORS;  Service: Neurosurgery;  Laterality: N/A;   PARTIAL COLECTOMY N/A    Procedure: PARTIAL BOWEL RESECTION WITH LYSIS OF ADHESIONS   POSTERIOR LAMINECTOMY / DECOMPRESSION CERVICAL SPINE N/A 1984   POSTERIOR LAMINECTOMY / DECOMPRESSION CERVICAL SPINE N/A 1985   POSTERIOR LUMBAR FUSION N/A 1999   SKIN CANCER EXCISION     TONSILLECTOMY Bilateral 1958   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   Procedure: TRANSURETHRAL RESECTION OF  BLADDER TUMOR (TURBT);  Surgeon: Ozell JONELLE Burkes, MD;  Location: [facility] ORS;  Service: Urology;  Laterality: N/A;    Home Medications:  Allergies as of 02/20/2024       Reactions   Celebrex [celecoxib] Nausea Only   Codeine Nausea Only   Penicillins Other (See Comments)   Unknown-last had as a child Other reaction(s): Other (See Comments), Unknown unknown   Entresto  [sacubitril-valsartan] Itching, Swelling        Medication List        Accurate as of February 20, 2024 11:59 PM. If you have any questions, ask your nurse or doctor.          albuterol  108 (90 Base) MCG/ACT inhaler Commonly known as: VENTOLIN  HFA Inhale into the lungs every 6 (six) hours as needed for wheezing or shortness of breath.   amitriptyline  100 MG tablet Commonly  known as: ELAVIL  Take 100 mg by mouth at bedtime.   apixaban  5 MG Tabs tablet Commonly known as: ELIQUIS  Take 1 tablet (5 mg total) by mouth 2 (two) times daily.   aspirin  EC 81 MG tablet Take 1 tablet (81 mg total) by mouth daily. Swallow whole.   azelastine 0.1 % nasal spray Commonly known as: ASTELIN Place 1 spray into both nostrils as needed.   bisacodyl  5 MG EC tablet Commonly known as: DULCOLAX Take 5 mg by mouth daily as needed for moderate constipation.   cloNIDine  0.1 MG tablet Commonly known as: CATAPRES  Take 0.1 mg by mouth 2 (two) times daily.   DULoxetine  60 MG capsule Commonly known as: CYMBALTA  60 mg every morning.   HYDROmorphone  4 MG tablet Commonly known as: DILAUDID  Take 1 tablet (4 mg total) by mouth every 8 (eight) hours as needed. Must last 30 days.   HYDROmorphone  4 MG tablet Commonly known as: DILAUDID  Take 1 tablet (4 mg total) by mouth every 8 (eight) hours as needed. Must last 30 days.   HYDROmorphone  4 MG tablet Commonly known as: DILAUDID  Take 1 tablet (4 mg total) by mouth every 8 (eight) hours as needed. Must last 30 days. Start taking on: March 18, 2024   levothyroxine  125 MCG  tablet Commonly known as: SYNTHROID  Take 125 mcg by mouth daily before breakfast.   loratadine 10 MG tablet Commonly known as: CLARITIN Take 10 mg by mouth at bedtime.   losartan  50 MG tablet Commonly known as: COZAAR  Take 1 tablet (50 mg total) by mouth daily.   lubiprostone 24 MCG capsule Commonly known as: AMITIZA Take 24 mcg by mouth.   mexiletine 150 MG capsule Commonly known as: MEXITIL TAKE 1 CAPSULE(150 MG) BY MOUTH TWICE DAILY   pregabalin  75 MG capsule Commonly known as: Lyrica  Take 1 capsule (75 mg total) by mouth 3 (three) times daily.   simvastatin  40 MG tablet Commonly known as: ZOCOR  Take 40 mg by mouth every morning.        Allergies:  Allergies  Allergen Reactions   Celebrex [Celecoxib] Nausea Only   Codeine Nausea Only   Penicillins Other (See Comments)    Unknown-last had as a child Other reaction(s): Other (See Comments), Unknown unknown   Entresto  [Sacubitril-Valsartan] Itching and Swelling    Family History: Family History  Problem Relation Age of Onset   Heart disease Mother    Emphysema Father    Heart attack Father    Emphysema Sister    Heart disease Brother     Social History:  reports that he has been smoking cigarettes. He has a 44.3 pack-year smoking history. He has never used smokeless tobacco. He reports that he does not drink alcohol and does not use drugs.   Physical Exam: BP 107/65   Pulse 82   Ht 6' 1 (1.854 m)   Wt 197 lb (89.4 kg)   BMI 25.99 kg/m   Constitutional:  Alert and oriented, No acute distress. HEENT: [place] AT, moist mucus membranes.  Trachea midline, no masses. Cardiovascular: No clubbing, cyanosis, or edema. Respiratory: Normal respiratory effort, no increased work of breathing. GI: Abdomen is soft, nontender, nondistended, no abdominal masses Skin: No rashes, bruises or suspicious lesions. Neurologic: Grossly intact, no focal deficits, moving all 4 extremities. Psychiatric: Normal mood and  affect.   Urinalysis Dipstick trace leukocyte/microscopy negative.   Assessment & Plan:    1. Gross hematuria AUA risk stratification: High We discussed the recommended evaluation of high  risk hematuria which consist of CT urogram and cystoscopy.  The procedures were discussed in detail and he/she has elected to proceed with further evaluation All questions were answered CTU order placed and cystoscopy was scheduled   2. History of urothelial carcinoma of the bladder Evaluation as above Will attempt to get his [facility] records loaded into Epic.  [facility] [address] [place], [place] [address] [phone]

## 2024-02-24 NOTE — Telephone Encounter (Signed)
 Patient with diagnosis of afib on Eliquis  for anticoagulation.    Procedure:  Dental Extraction - Amount of Teeth to be Pulled:  26 (all) teeth to be removed and bone recontouring  Date of procedure: TBD   CHA2DS2-VASc Score = 6   This indicates a 9.7% annual risk of stroke. The patient's score is based upon: CHF History: 1 HTN History: 1 Diabetes History: 1 Stroke History: 0 Vascular Disease History: 1 Age Score: 2 Gender Score: 0      CrCl 88 ml/min Platelet count 260  Patient has not had an Afib/aflutter ablation within the last 3 months or DCCV within the last 30 days  Patient does not require pre-op antibiotics for dental procedure.  Per office protocol, patient can hold Eliquis  for 1 day prior to procedure.    **This guidance is not considered finalized until pre-operative APP has relayed final recommendations.**

## 2024-02-25 ENCOUNTER — Ambulatory Visit: Payer: Self-pay

## 2024-02-25 NOTE — Telephone Encounter (Signed)
   Patient Name: [name]  DOB: [birth year] MRN: [id number]  Primary Cardiologist: None  Chart reviewed as part of pre-operative protocol coverage. Given past medical history and time since last visit, based on ACC/AHA guidelines, [name] is at acceptable risk for the planned procedure without further cardiovascular testing.   Per office protocol, patient can hold Eliquis  for 1 day prior to procedure.    The patient was advised that if he develops new symptoms prior to surgery to contact our office to arrange for a follow-up visit, and he verbalized understanding.  I will route this recommendation to the requesting party via Epic fax function and remove from pre-op pool.  Please call with questions.  Lamarr Satterfield, NP 02/25/2024, 7:51 AM

## 2024-02-25 NOTE — Telephone Encounter (Signed)
 FYI Only or Action Required?: FYI only for provider.  Called Nurse Triage reporting Laceration.  Symptoms began 1.5 weeks ago.  Interventions attempted: Rest, hydration, or home remedies.  Symptoms are: gradually worsening.  Triage Disposition: See Physician Within 24 Hours  Patient/caregiver understands and will follow disposition?: No appointment, going to urgent care       Copied from CRM #[id number]. Topic: Clinical - Red Word Triage >> Feb 25, 2024  4:05 PM Chiquita SQUIBB wrote: Red Word that prompted transfer to Nurse Triage: Patients daughter is calling in stating that patient has a cut on his leg that has a smell and some blood,  they are growing concerned about the smell of it.       Reason for Disposition  [1] Looks infected (e.g., spreading redness, pus) AND [2] no fever  Answer Assessment - Initial Assessment Questions 1. APPEARANCE of INJURY: What does the injury look like?      Skin came off a welt from hitting his leg  2. ONSET: How long ago did the injury occur?      1.5 weeks ago  3. LOCATION: Where is the injury located?      Right leg  4. SIZE: How large is the cut?      2-3 inches  5. BLEEDING: Is it bleeding now? If Yes, ask: Is it difficult to stop?      No 6. PAIN: Is there any pain? If Yes, ask: How bad is the pain? (Scale 0-10; or none, mild, moderate, severe)     Mild  7. MECHANISM: Tell me how it happened.      Hit his leg 1.5 weeks ago  8. TETANUS: When was your last tetanus booster?     Unsure  Protocols used: Cuts and Lacerations-A-AH

## 2024-03-05 ENCOUNTER — Encounter: Payer: Self-pay | Admitting: Internal Medicine

## 2024-03-16 ENCOUNTER — Ambulatory Visit
Admission: RE | Admit: 2024-03-16 | Payer: Medicare (Managed Care) | Source: Home / Self Care | Admitting: Internal Medicine

## 2024-03-16 SURGERY — COLONOSCOPY
Anesthesia: General

## 2024-03-25 ENCOUNTER — Encounter: Payer: Self-pay | Admitting: *Deleted

## 2024-03-30 ENCOUNTER — Other Ambulatory Visit: Payer: Medicare (Managed Care) | Admitting: Urology

## 2024-04-05 ENCOUNTER — Encounter: Payer: Medicare (Managed Care) | Admitting: Nurse Practitioner

## 2024-04-12 ENCOUNTER — Ambulatory Visit: Payer: Medicare (Managed Care) | Attending: Nurse Practitioner | Admitting: Nurse Practitioner

## 2024-04-12 ENCOUNTER — Encounter: Payer: Self-pay | Admitting: Nurse Practitioner

## 2024-04-12 VITALS — BP 148/85 | HR 83 | Temp 98.0°F | Resp 22 | Ht 74.0 in | Wt 198.0 lb

## 2024-04-12 DIAGNOSIS — M792 Neuralgia and neuritis, unspecified: Secondary | ICD-10-CM | POA: Diagnosis present

## 2024-04-12 DIAGNOSIS — M25562 Pain in left knee: Secondary | ICD-10-CM | POA: Insufficient documentation

## 2024-04-12 DIAGNOSIS — M25561 Pain in right knee: Secondary | ICD-10-CM | POA: Diagnosis present

## 2024-04-12 DIAGNOSIS — M79604 Pain in right leg: Secondary | ICD-10-CM | POA: Diagnosis present

## 2024-04-12 DIAGNOSIS — M79672 Pain in left foot: Secondary | ICD-10-CM | POA: Diagnosis present

## 2024-04-12 DIAGNOSIS — Z79891 Long term (current) use of opiate analgesic: Secondary | ICD-10-CM | POA: Diagnosis present

## 2024-04-12 DIAGNOSIS — M5441 Lumbago with sciatica, right side: Secondary | ICD-10-CM | POA: Insufficient documentation

## 2024-04-12 DIAGNOSIS — M79605 Pain in left leg: Secondary | ICD-10-CM | POA: Insufficient documentation

## 2024-04-12 DIAGNOSIS — M79671 Pain in right foot: Secondary | ICD-10-CM | POA: Diagnosis present

## 2024-04-12 DIAGNOSIS — G894 Chronic pain syndrome: Secondary | ICD-10-CM | POA: Diagnosis present

## 2024-04-12 DIAGNOSIS — M961 Postlaminectomy syndrome, not elsewhere classified: Secondary | ICD-10-CM | POA: Diagnosis present

## 2024-04-12 DIAGNOSIS — G8929 Other chronic pain: Secondary | ICD-10-CM | POA: Insufficient documentation

## 2024-04-12 DIAGNOSIS — M5442 Lumbago with sciatica, left side: Secondary | ICD-10-CM | POA: Diagnosis present

## 2024-04-12 DIAGNOSIS — Z79899 Other long term (current) drug therapy: Secondary | ICD-10-CM | POA: Insufficient documentation

## 2024-04-12 DIAGNOSIS — M5030 Other cervical disc degeneration, unspecified cervical region: Secondary | ICD-10-CM | POA: Insufficient documentation

## 2024-04-12 DIAGNOSIS — G63 Polyneuropathy in diseases classified elsewhere: Secondary | ICD-10-CM | POA: Diagnosis present

## 2024-04-12 MED ORDER — HYDROMORPHONE HCL 4 MG PO TABS: 4.0000 mg | ORAL_TABLET | Freq: Three times a day (TID) | ORAL | 0 refills | Status: DC | PRN

## 2024-04-12 MED ORDER — PREGABALIN 75 MG PO CAPS: 75.0000 mg | ORAL_CAPSULE | Freq: Three times a day (TID) | ORAL | 5 refills | Status: AC

## 2024-04-12 NOTE — Progress Notes (Signed)
 Nursing Pain Medication Assessment:  Safety precautions to be maintained throughout the outpatient stay will include: orient to surroundings, keep bed in low position, maintain call bell within reach at all times, provide assistance with transfer out of bed and ambulation.  Medication Inspection Compliance: Pill count conducted under aseptic conditions, in front of the patient. Neither the pills nor the bottle was removed from the patient's sight at any time. Once count was completed pills were immediately returned to the patient in their original bottle.  Medication: Hydromorphone  (Dilaudid ) Pill/Patch Count: 90 of 20 pills/patches remain Pill/Patch Appearance: Markings consistent with prescribed medication Bottle Appearance: Standard pharmacy container. Clearly labeled. Filled Date: 09 / 02 / 2025 Last Medication intake:  Today

## 2024-04-12 NOTE — Progress Notes (Signed)
 PROVIDER NOTE: Interpretation of information contained herein should be left to medically-trained personnel. Specific patient instructions are provided elsewhere under Patient Instructions section of medical record. This document was created in part using AI and STT-dictation technology, any transcriptional errors that may result from this process are unintentional.  Patient: [name]  Service: E/M   PCP: Fernande Ophelia JINNY DOUGLAS, MD  DOB: 10/08/1945  DOS: 04/12/2024  Provider: Emmy MARLA Blanch, NP  MRN: [id number]  Delivery: Face-to-face  Specialty: Interventional Pain Management  Type: Established Patient  Setting: Ambulatory outpatient facility  Specialty designation: 09  Referring Prov.: Fernande Ophelia JINNY DOUGLAS, MD  Location: Outpatient office facility       History of present illness (HPI) Mr. MONTERIO BOB, a 78 y.o. year old male, is here today because of his Chronic pain of both feet [M79.671, G89.29, M79.672]. Mr. Kropp primary complain today is Back Pain (low)  Pertinent problems: Mr. 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 Location: Back Lower/radiates down both legs. Onset: More than a month ago. Quality: Sharp. Timing: Constant. Modifying factor(s): medications. Vitals:  height is 6' 2 (1.88 m) and weight is 198 lb (89.8 kg). His temperature is 98 F (36.7 C). His blood pressure is 148/85 (abnormal) and his pulse  is 83. His respiration is 22 (abnormal) and oxygen saturation is 94%.  BMI: Estimated body mass index is 25.42 kg/m as calculated from the following:   Height as of this encounter: 6' 2 (1.88 m).   Weight as of this encounter: 198 lb (89.8 kg).  Last encounter: 01/07/2024. Last procedure: Visit date not found.  Reason for encounter: medication management. The patient indicates doing well with current medication regimen. No adverse reaction or side effects reported to medication. He experienced pain mainly in his lower back with radiation down to his legs and also in neck pain. The pain in his shoulders becomes particularly intense when he sleeps on his sides, often causing him to wake up in the middle of the night due to discomfort. He would like to confirm coverage with his new insurance and the [place] before proceeding with discussion of cervical epidural injection. Pharmacotherapy Assessment   Analgesic: Hydromorphone  (Dilaudid ) 4 mg tablet every 8 hours as needed for pain. MME=60 Monitoring: [place] PMP: PDMP reviewed during this encounter.       Pharmacotherapy: No side-effects or adverse reactions reported. Compliance: No problems identified. Effectiveness: Clinically acceptable.  Rebecka Wolm HERO, RN  04/12/2024  1:46 PM  Sign when Signing Visit Nursing Pain Medication Assessment:  Safety precautions to be maintained throughout the outpatient stay will include: orient to surroundings, keep bed in low position, maintain call bell within reach at all times, provide assistance with transfer out of bed and ambulation.  Medication Inspection Compliance: Pill count conducted under aseptic conditions, in front of the patient. Neither the pills nor the bottle was removed from the patient's sight at any time. Once count was completed pills were immediately returned to the patient in  their original bottle.  Medication: Hydromorphone  (Dilaudid ) Pill/Patch Count: 90 of 20 pills/patches remain Pill/Patch  Appearance: Markings consistent with prescribed medication Bottle Appearance: Standard pharmacy container. Clearly labeled. Filled Date: 09 / 02 / 2025 Last Medication intake:  Today    UDS:  Summary  Date Value Ref Range Status  10/13/2023 FINAL  Final    Comment:    ==================================================================== [employer] [facility] 13 (MW) ==================================================================== Test                             Result       Flag       Units  Drug Present and Declared for Prescription Verification   Hydromorphone                   9655         EXPECTED   ng/mg creat    Hydromorphone  may be administered as a scheduled prescription    medication; it is also an expected metabolite of hydrocodone.  ==================================================================== Test                      Result    Flag   Units      Ref Range   Creatinine              51               mg/dL      >=79 ==================================================================== Declared Medications:  The flagging and interpretation on this report are based on the  following declared medications.  Unexpected results may arise from  inaccuracies in the declared medications.   **Note: The testing scope of this panel includes these medications:   Hydromorphone  (Dilaudid )   **Note: The testing scope of this panel does not include the  following reported medications:   Albuterol  (Ventolin  HFA)  Amitriptyline  (Elavil )  Aspirin   Azelastine (Astelin)  Bisacodyl   Clonidine  (Catapres )  Duloxetine  (Cymbalta )  Levothyroxine  (Synthroid )  Loratadine (Claritin)  Losartan  (Cozaar )  Mexiletine (Mexitil)  Naloxone  (Narcan )  Pregabalin  (Lyrica )  Simvastatin  (Zocor ) ==================================================================== For clinical consultation, please call [phone]. ====================================================================      No results found for: CBDTHCR No results found for: D8THCCBX No results found for: D9THCCBX  ROS  Constitutional: Denies any fever or chills Gastrointestinal: No reported hemesis, hematochezia, vomiting, or acute GI distress Musculoskeletal: Denies any acute onset joint swelling, redness, loss of ROM, or weakness Neurological: No reported episodes of acute onset apraxia, aphasia, dysarthria, agnosia, amnesia, paralysis, loss of coordination, or loss of consciousness  Medication Review  DULoxetine , HYDROmorphone , albuterol , amitriptyline , apixaban , aspirin  EC, azelastine, bisacodyl , cloNIDine , levothyroxine , loratadine, losartan , lubiprostone, mexiletine, pregabalin , and simvastatin   History Review  Allergy: Mr. Burbach is allergic to celebrex [celecoxib], codeine, penicillins, and entresto  [sacubitril-valsartan]. Drug: Mr. Neville  reports no history of drug use. Alcohol:  reports no history of alcohol use. Tobacco:  reports that he has been smoking cigarettes. He has a 44.3 pack-year smoking history. He has never used smokeless tobacco. Social: Mr. Berger  reports that he has been smoking cigarettes. He has a 44.3 pack-year smoking history. He has never used smokeless tobacco. He reports that he does not drink alcohol and does not use drugs. Medical:  has a past medical history of Acute anterolateral wall MI (HCC) (2000), Acute non-Q wave non-ST elevation myocardial infarction (NSTEMI) (HCC) (12/03/2004), Adenomatous polyp of colon, Agent orange exposure, Angina pectoris (HCC), Anxiety, Aortic atherosclerosis, Asthma, Bladder stones,  Bradycardia, CHF (congestive heart failure) (HCC), Chronic lower back pain, Chronic pain syndrome, COPD (chronic obstructive pulmonary disease) (HCC), Coronary artery disease, Depression, DJD (degenerative joint disease), DVT of lower extremity, bilateral (HCC), Dyspnea, GERD (gastroesophageal reflux disease), History of hiatal hernia, HLD (hyperlipidemia),  Hypertension, Hypothyroidism, Inflammatory polyarthropathy (HCC), Internal hemorrhoids, Long term prescription opiate use, 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 Surgical: Mr. Bowsher  has a past surgical history that includes Coronary angioplasty; Posterior lumbar fusion (N/A, 1999); Nissen fundoplication (N/A, 2000); Mastoidectomy; Partial colectomy (N/A); Intrathecal pump implantation (N/A, 03/2008); Intrathecal pump revision (N/A, 09/28/2014); Transurethral resection of bladder tumor (N/A, 03/07/2015); Colonoscopy with propofol  (N/A, 02/27/2018); External ear surgery; Skin cancer excision; IVC FILTER PLACEMENT ([facility] HX) (N/A, 09/2007); IVC FILTER REMOVAL (N/A, 2011); Colonoscopy (N/A, 11/22/2013); Colonoscopy (N/A, 09/19/2008); Colonoscopy (N/A, 08/25/2003); Colonoscopy (N/A, 10/15/1994); Posterior laminectomy / decompression cervical spine (N/A, 1984); Posterior laminectomy / decompression cervical spine (N/A, 1985); Tonsillectomy (Bilateral, 1958); Coronary angioplasty with stent (Left, 12/04/2004); LEFT HEART CATH AND CORONARY ANGIOGRAPHY (Left, 02/19/2008); LEFT HEART CATH AND CORONARY ANGIOGRAPHY (Left, 04/18/1987); LEFT HEART CATH AND CORONARY ANGIOGRAPHY (Left, 02/03/2002); and Pain pump removal (N/A, 12/20/2021). Family: family history includes Emphysema in his father and sister; Heart attack in his father; Heart disease in his brother and mother.  Laboratory Chemistry Profile   Renal Lab Results  Component Value Date   BUN 10  01/30/2022   CREATININE 1.00 03/12/2023   BCR 11 01/30/2022   GFRAA >60 03/02/2015   GFRNONAA >60 11/01/2021    Hepatic Lab Results  Component Value Date   AST 31 01/30/2022   ALBUMIN 4.0 01/30/2022   ALKPHOS 96 01/30/2022    Electrolytes Lab Results  Component Value Date   NA 145 (H) 01/30/2022   K 5.2 01/30/2022   CL 104 01/30/2022   CALCIUM 9.6 01/30/2022   MG 2.3 11/01/2021   PHOS 3.7 11/01/2021    Bone Lab Results  Component Value Date   25OHVITD1 22 (L) 02/07/2021   25OHVITD2 <1.0 02/07/2021   25OHVITD3 22 02/07/2021    Inflammation (CRP: Acute Phase) (ESR: Chronic Phase) Lab Results  Component Value Date   CRP 5 01/30/2022   ESRSEDRATE 11 01/30/2022   LATICACIDVEN 0.9 10/28/2021         Note: Above Lab results reviewed.  Recent Imaging Review  DG PAIN CLINIC C-ARM 1-60 MIN NO REPORT Fluoro was used, but no Radiologist interpretation will be provided.  Please refer to NOTES tab for provider progress note. Note: Reviewed        Physical Exam  Vitals: BP (!) 148/85   Pulse 83   Temp 98 F (36.7 C)   Resp (!) 22   Ht 6' 2 (1.88 m)   Wt 198 lb (89.8 kg)   SpO2 94%   BMI 25.42 kg/m  BMI: Estimated body mass index is 25.42 kg/m as calculated from the following:   Height as of this encounter: 6' 2 (1.88 m).   Weight as of this encounter: 198 lb (89.8 kg). Ideal: Ideal body weight: 82.2 kg (181 lb 3.5 oz) Adjusted ideal body weight: 85.2 kg (187 lb 14.9 oz) General appearance: Well nourished, well developed, and well hydrated.  In no apparent acute distress Mental status: Alert, oriented x 3 (person, place, & time)       Respiratory: No evidence of acute respiratory distress Eyes: PERLA   Assessment   Diagnosis Status  1. Chronic feet pain (3ry area of Pain) (Bilateral) (R>L)   2. 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 DDD (degenerative disc disease), cervical   4. Pharmacologic therapy   5.  Encounter for medication management   6. Encounter for chronic pain management   7. Lumbar post-laminectomy syndrome (L4-5, L5-S1)   8. Chronic lower extremity pain (2ry area of Pain) (Bilateral) (R>L)   9. Chronic knee pain (4th area of Pain) (Bilateral) (R>L)   10. Failed back surgical syndrome   11. Chronic pain syndrome   12. Chronic use of opiate for therapeutic purpose   13. Chronic neurogenic pain   14. Peripheral neurogenic pain   15. Polyneuropathy associated with underlying disease (HCC)    Controlled Controlled Controlled   Updated Problems: No problems updated.  Plan of Care  Problem-specific:  Assessment and Plan  We will continue on current medication regimen.  Prescribing drug monitoring (PDMP) reviewed; findings consistent with the use of prescribed medication and no evidence of narcotic misuse or abuse.  No other new issues or problems reported at this visit.  Schedule follow-up in 90 days for medication management.   Mr. WILMORE HOLSOMBACK has a current medication list which includes the following long-term medication(s): albuterol , amitriptyline , apixaban , azelastine, duloxetine , levothyroxine , loratadine, losartan , mexiletine, simvastatin , [START ON 04/22/2024] hydromorphone , [START ON 05/22/2024] hydromorphone , [START ON 06/21/2024] hydromorphone , and pregabalin .  Pharmacotherapy (Medications Ordered): Meds ordered this encounter  Medications   HYDROmorphone  (DILAUDID ) 4 MG tablet    Sig: Take 1 tablet (4 mg total) by mouth every 8 (eight) hours as needed. Must last 30 days.    Dispense:  90 tablet    Refill:  0    DO NOT: delete (not duplicate); no partial-fill (will deny script to complete), no refill request (F/U required). DISPENSE: 1 day early if closed on fill date. WARN: No CNS-depressants within 8 hrs of med.   HYDROmorphone  (DILAUDID ) 4 MG tablet    Sig: Take 1 tablet (4 mg total) by mouth every 8 (eight) hours as needed. Must last 30 days.    Dispense:  90  tablet    Refill:  0    DO NOT: delete (not duplicate); no partial-fill (will deny script to complete), no refill request (F/U required). DISPENSE: 1 day early if closed on fill date. WARN: No CNS-depressants within 8 hrs of med.   HYDROmorphone  (DILAUDID ) 4 MG tablet    Sig: Take 1 tablet (4 mg total) by mouth every 8 (eight) hours as needed. Must last 30 days.    Dispense:  90 tablet    Refill:  0    DO NOT: delete (not duplicate); no partial-fill (will deny script to complete), no refill request (F/U required). DISPENSE: 1 day early if closed on fill date. WARN: No CNS-depressants within 8 hrs of med.   pregabalin  (LYRICA ) 75 MG capsule    Sig: Take 1 capsule (75 mg total) by mouth 3 (three) times daily.    Dispense:  90 capsule    Refill:  5    Fill one day early if pharmacy is closed on scheduled refill date. May substitute for generic if available.   Orders:  No orders of the defined types were  placed in this encounter.     Return in about 3 months (around 07/12/2024) for (F2F), (MM), Emmy Blanch NP.    Recent Visits No visits were found meeting these conditions. Showing recent visits within past 90 days and meeting all other requirements Today's Visits Date Type Provider Dept  04/12/24 Office Visit Cara Aguino K, NP [facility]  Showing today's visits and meeting all other requirements Future Appointments Date Type Provider Dept  07/08/24 Appointment Oshua Mcconaha K, NP [facility]  Showing future appointments within next 90 days and meeting all other requirements  I discussed the assessment and treatment plan with the patient. The patient was provided an opportunity to ask questions and all were answered. The patient agreed with the plan and demonstrated an understanding of the instructions.  Patient advised to call back or seek an in-person evaluation if the symptoms or condition worsens. I personally spent a total of 30 minutes in the care of the  patient today including preparing to see the patient, getting/reviewing separately obtained history, performing a medically appropriate exam/evaluation, counseling and educating, placing orders, documenting clinical information in the EHR, independently interpreting results, communicating results, and coordinating care.   Duration of encounter:  minutes.  Total time on encounter, as per AMA guidelines included both the face-to-face and non-face-to-face time personally spent by the physician and/or other qualified health care professional(s) on the day of the encounter (includes time in activities that require the physician or other qualified health care professional and does not include time in activities normally performed by clinical staff). Physician's time may include the following activities when performed: Preparing to see the patient (e.g., pre-charting review of records, searching for previously ordered imaging, lab work, and nerve conduction tests) Review of prior analgesic pharmacotherapies. Reviewing PMP Interpreting ordered tests (e.g., lab work, imaging, nerve conduction tests) Performing post-procedure evaluations, including interpretation of diagnostic procedures Obtaining and/or reviewing separately obtained history Performing a medically appropriate examination and/or evaluation Counseling and educating the patient/family/caregiver Ordering medications, tests, or procedures Referring and communicating with other health care professionals (when not separately reported) Documenting clinical information in the electronic or other health record Independently interpreting results (not separately reported) and communicating results to the patient/ family/caregiver Care coordination (not separately reported)  Note by: Emmy MARLA Blanch, NP  Date: 04/12/2024; Time: 2:08 PM

## 2024-04-15 ENCOUNTER — Encounter: Payer: Self-pay | Admitting: Internal Medicine

## 2024-04-27 ENCOUNTER — Ambulatory Visit
Admission: RE | Admit: 2024-04-27 | Payer: Medicare (Managed Care) | Source: Home / Self Care | Admitting: Internal Medicine

## 2024-04-27 ENCOUNTER — Encounter: Admission: RE | Payer: Self-pay | Source: Home / Self Care

## 2024-04-27 ENCOUNTER — Ambulatory Visit (INDEPENDENT_AMBULATORY_CARE_PROVIDER_SITE_OTHER): Payer: Medicare (Managed Care) | Admitting: Vascular Surgery

## 2024-04-27 SURGERY — COLONOSCOPY
Anesthesia: General

## 2024-07-08 ENCOUNTER — Encounter: Payer: Self-pay | Admitting: Nurse Practitioner

## 2024-07-08 ENCOUNTER — Ambulatory Visit: Payer: Medicare (Managed Care) | Admitting: Nurse Practitioner

## 2024-07-08 DIAGNOSIS — M5441 Lumbago with sciatica, right side: Secondary | ICD-10-CM | POA: Insufficient documentation

## 2024-07-08 DIAGNOSIS — M5442 Lumbago with sciatica, left side: Secondary | ICD-10-CM | POA: Diagnosis present

## 2024-07-08 DIAGNOSIS — M961 Postlaminectomy syndrome, not elsewhere classified: Secondary | ICD-10-CM | POA: Insufficient documentation

## 2024-07-08 DIAGNOSIS — Z79899 Other long term (current) drug therapy: Secondary | ICD-10-CM | POA: Diagnosis present

## 2024-07-08 DIAGNOSIS — M25562 Pain in left knee: Secondary | ICD-10-CM | POA: Diagnosis present

## 2024-07-08 DIAGNOSIS — Z79891 Long term (current) use of opiate analgesic: Secondary | ICD-10-CM | POA: Insufficient documentation

## 2024-07-08 DIAGNOSIS — M25561 Pain in right knee: Secondary | ICD-10-CM | POA: Diagnosis present

## 2024-07-08 DIAGNOSIS — M79605 Pain in left leg: Secondary | ICD-10-CM | POA: Insufficient documentation

## 2024-07-08 DIAGNOSIS — G8929 Other chronic pain: Secondary | ICD-10-CM | POA: Insufficient documentation

## 2024-07-08 DIAGNOSIS — M79671 Pain in right foot: Secondary | ICD-10-CM | POA: Diagnosis present

## 2024-07-08 DIAGNOSIS — M79672 Pain in left foot: Secondary | ICD-10-CM | POA: Insufficient documentation

## 2024-07-08 DIAGNOSIS — M79604 Pain in right leg: Secondary | ICD-10-CM | POA: Diagnosis present

## 2024-07-08 DIAGNOSIS — G894 Chronic pain syndrome: Secondary | ICD-10-CM | POA: Insufficient documentation

## 2024-07-08 MED ORDER — HYDROMORPHONE HCL 4 MG PO TABS: 4.0000 mg | ORAL_TABLET | Freq: Three times a day (TID) | ORAL | 0 refills | Status: AC | PRN

## 2024-07-08 NOTE — Progress Notes (Signed)
 PROVIDER NOTE: Interpretation of information contained herein should be left to medically-trained personnel. Specific patient instructions are provided elsewhere under Patient Instructions section of medical record. This document was created in part using AI and STT-dictation technology, any transcriptional errors that may result from this process are unintentional.  Patient: [name]  Service: E/M   PCP: Fernande Ophelia JINNY DOUGLAS, MD  DOB: 1946/02/19  DOS: 07/08/2024  Provider: Emmy MARLA Blanch, NP  MRN: [id number]  Delivery: Face-to-face  Specialty: Interventional Pain Management  Type: Established Patient  Setting: Ambulatory outpatient facility  Specialty designation: 09  Referring Prov.: Fernande Ophelia JINNY DOUGLAS, MD  Location: Outpatient office facility       History of present illness (HPI) [name], a 78 y.o. year old male, is here today because of his low back pain and bilateral leg pain.  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  Pain Assessment: Severity of Chronic pain is reported as a 10-Worst pain ever/10. Location: Back Lower/radiates into legs. Onset: More than a month ago. Quality: Aching, Sharp. Timing: Constant. Modifying factor(s): nothing. Vitals:  height is 6' 2 (1.88 m) and weight is 215 lb (97.5 kg). His temperature is 98 F (36.7 C). His blood pressure is 144/90 (abnormal) and his pulse is 85. His respiration is 18 and oxygen saturation is 94%.  BMI: Estimated body mass index is 27.6 kg/m as calculated from the following:   Height as of this encounter: 6' 2 (1.88 m).   Weight as of this encounter: 215 lb (97.5 kg).  Last encounter: 04/12/2024. Last procedure: 04/12/2024.  Reason for encounter: medication management. The patient indicates doing well with current pain medication regimen. No side effects or adverse reaction reported to medication.   Discussed the use of AI scribe software for clinical note transcription with the patient, who gave verbal consent to proceed.  History of Present Illness   [name] is a 78 year old male who presents with lower back and leg pain.  He experiences pain in his lower back that radiates to both legs, described as 'all over' the legs without specific localization.  He has previously received injections for pain management, but his insurance requires participation in physical therapy before approving further injections. He is reluctant to undergo physical therapy, particularly for his neck, preferring treatment directly from a doctor. He mentions a history of receiving a cervical epidural in April.  He is currently taking hydromorphone  (Dilaudid ) 4 mg, three times a day, but it is not effective in managing his pain. He denies experiencing any side effects from the medication.  He anticipates a change in his insurance to [employer] on January 1st, which he  hopes will be more accommodating regarding treatment approvals.      Pharmacotherapy Assessment   Analgesic: Hydromorphone  (Dilaudid ) 4 mg tablet every 8 hours as needed for pain. MME=60 Monitoring: [place] PMP: PDMP reviewed during this encounter.       Pharmacotherapy: No side-effects or adverse reactions reported. Compliance: No problems identified. Effectiveness: Clinically acceptable.  Gabriel Wolm HERO, RN  07/08/2024  1:25 PM  Sign when Signing Visit Nursing Pain Medication Assessment:  Safety precautions to be maintained throughout the outpatient stay will include: orient to surroundings, keep bed in low position, maintain call bell within reach at all times, provide assistance with transfer out of bed and ambulation.  Medication Inspection Compliance: Pill count conducted under aseptic conditions, in front of the patient. Neither the pills nor the bottle was removed from the patient's sight at any time. Once count was completed pills were immediately returned to the patient in their original bottle.  Medication: Hydromorphone  (Dilaudid ) Pill/Patch Count: 46 of 90 pills/patches remain Pill/Patch Appearance: Markings consistent with prescribed medication Bottle Appearance: Standard pharmacy container. Clearly labeled. Filled Date: 26 / 02 / 2025 Last Medication intake:  Today    UDS:  Summary  Date Value Ref Range Status  10/13/2023 FINAL  Final    Comment:    ==================================================================== [employer] 13 (MW) ==================================================================== Test                             Result       Flag       Units  Drug Present and Declared for Prescription Verification   Hydromorphone                   9655         EXPECTED   ng/mg creat    Hydromorphone  may be administered as a scheduled prescription    medication; it is also an expected metabolite of hydrocodone.  ==================================================================== Test                      Result    Flag    Units      Ref Range   Creatinine              51               mg/dL      >=79 ==================================================================== Declared Medications:  The flagging and interpretation on this report are based on the  following declared medications.  Unexpected results may arise from  inaccuracies in the declared medications.   **Note: The testing scope of this panel includes these medications:   Hydromorphone  (Dilaudid )   **Note: The testing scope of this panel does not include the  following reported medications:   Albuterol  (Ventolin  HFA)  Amitriptyline  (Elavil )  Aspirin   Azelastine (Astelin)  Bisacodyl   Clonidine  (Catapres )  Duloxetine  (Cymbalta )  Levothyroxine  (Synthroid )  Loratadine (Claritin)  Losartan  (Cozaar )  Mexiletine (Mexitil)  Naloxone  (Narcan )  Pregabalin  (Lyrica )  Simvastatin  (Zocor ) ==================================================================== For clinical consultation, please call [phone]. ====================================================================     No results found for: CBDTHCR No results found for: D8THCCBX No results found for: D9THCCBX  ROS  Constitutional: Denies any fever or chills Gastrointestinal: No reported hemesis, hematochezia, vomiting, or acute GI distress Musculoskeletal: Low back pain, bilateral leg pain Neurological: No reported episodes of acute onset apraxia, aphasia, dysarthria, agnosia, amnesia, paralysis, loss of coordination, or loss of consciousness  Medication Review  DULoxetine , HYDROmorphone ,  albuterol , amitriptyline , apixaban , aspirin  EC, azelastine, bisacodyl , cloNIDine , levothyroxine , loratadine, losartan , lubiprostone, mexiletine, pregabalin , and simvastatin   History Review  Allergy: Mr. Coppolino is allergic to celebrex [celecoxib], codeine, penicillins, and entresto  [sacubitril-valsartan]. Drug: Mr. Follett  reports no history of drug use. Alcohol:  reports no history of  alcohol use. Tobacco:  reports that he has been smoking cigarettes. He has a 44.3 pack-year smoking history. He has never used smokeless tobacco. Social: Mr. Battiste  reports that he has been smoking cigarettes. He has a 44.3 pack-year smoking history. He has never used smokeless tobacco. He reports that he does not drink alcohol and does not use drugs. Medical:  has a past medical history of Acute anterolateral wall MI (HCC) (2000), Acute non-Q wave non-ST elevation myocardial infarction (NSTEMI) (HCC) (12/03/2004), Adenomatous polyp of colon, Agent orange exposure, Angina pectoris, Anxiety, Aortic atherosclerosis, Asthma, Bladder stones, Bradycardia, CHF (congestive heart failure) (HCC), Chronic lower back pain, Chronic pain syndrome, COPD (chronic obstructive pulmonary disease) (HCC), Coronary artery disease, Depression, DJD (degenerative joint disease), DVT of lower extremity, bilateral (HCC), Dyspnea, GERD (gastroesophageal reflux disease), History of hiatal hernia, HLD (hyperlipidemia), Hypertension, Hypothyroidism, Inflammatory polyarthropathy (HCC), Internal hemorrhoids, Long term prescription opiate use, 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 Surgical: Mr. Blizzard  has a past surgical history that includes Coronary angioplasty; Posterior lumbar fusion (N/A, 1999); Nissen fundoplication (N/A, 2000); Mastoidectomy; Partial colectomy (N/A); Intrathecal pump implantation (N/A, 03/2008); Intrathecal pump revision (N/A, 09/28/2014); Transurethral  resection of bladder tumor (N/A, 03/07/2015); Colonoscopy with propofol  (N/A, 02/27/2018); External ear surgery; Skin cancer excision; IVC FILTER PLACEMENT ([facility] HX) (N/A, 09/2007); IVC FILTER REMOVAL (N/A, 2011); Colonoscopy (N/A, 11/22/2013); Colonoscopy (N/A, 09/19/2008); Colonoscopy (N/A, 08/25/2003); Colonoscopy (N/A, 10/15/1994); Posterior laminectomy / decompression cervical spine (N/A, 1984); Posterior laminectomy / decompression cervical spine (N/A, 1985); Tonsillectomy (Bilateral, 1958); Coronary angioplasty with stent (Left, 12/04/2004); LEFT HEART CATH AND CORONARY ANGIOGRAPHY (Left, 02/19/2008); LEFT HEART CATH AND CORONARY ANGIOGRAPHY (Left, 04/18/1987); LEFT HEART CATH AND CORONARY ANGIOGRAPHY (Left, 02/03/2002); and Pain pump removal (N/A, 12/20/2021). Family: family history includes Emphysema in his father and sister; Heart attack in his father; Heart disease in his brother and mother.  Laboratory Chemistry Profile   Renal Lab Results  Component Value Date   BUN 10 01/30/2022   CREATININE 1.00 03/12/2023   BCR 11 01/30/2022   GFRAA >60 03/02/2015   GFRNONAA >60 11/01/2021    Hepatic Lab Results  Component Value Date   AST 31 01/30/2022   ALBUMIN 4.0 01/30/2022   ALKPHOS 96 01/30/2022    Electrolytes Lab Results  Component Value Date   NA 145 (H) 01/30/2022   K 5.2 01/30/2022   CL 104 01/30/2022   CALCIUM 9.6 01/30/2022   MG 2.3 11/01/2021   PHOS 3.7 11/01/2021    Bone Lab Results  Component Value Date   25OHVITD1 22 (L) 02/07/2021   25OHVITD2 <1.0 02/07/2021   25OHVITD3 22 02/07/2021    Inflammation (CRP: Acute Phase) (ESR: Chronic Phase) Lab Results  Component Value Date   CRP 5 01/30/2022   ESRSEDRATE 11 01/30/2022   LATICACIDVEN 0.9 10/28/2021         Note: Above Lab results reviewed.  Recent Imaging Review  DG PAIN CLINIC C-ARM 1-60 MIN NO REPORT Fluoro was used, but no Radiologist interpretation will be provided.  Please refer to NOTES tab for  provider progress note. Note: Reviewed        Physical Exam  Vitals: BP (!) 144/90   Pulse 85   Temp 98 F (36.7 C)   Resp 18   Ht 6' 2 (1.88 m)   Wt 215 lb (97.5 kg)   SpO2 94%   BMI 27.60 kg/m  BMI: Estimated body mass index is 27.6 kg/m as calculated from the following:   Height as of this encounter: 6' 2 (1.88 m).   Weight as of this encounter: 215 lb (97.5 kg). Ideal: Ideal body weight: 82.2 kg (181 lb 3.5 oz) Adjusted ideal body weight: 88.3 kg (194 lb 11.7 oz) General appearance: Well nourished, well developed, and well hydrated. In no apparent acute distress Mental status: Alert, oriented x 3 (person, place, & time)       Respiratory: No evidence of acute respiratory distress Eyes: PERLA  Musculoskeletal: +LBP Assessment   Diagnosis Status  1. Chronic feet pain (3ry area of Pain) (Bilateral) (R>L)   2. 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 Pharmacologic therapy   4. Lumbar post-laminectomy syndrome (L4-5, L5-S1)   5. Chronic lower extremity pain (2ry area of Pain) (Bilateral) (R>L)   6. Chronic knee pain (4th area of Pain) (Bilateral) (R>L)   7. Failed back surgical syndrome   8. Chronic pain syndrome   9. Chronic use of opiate for therapeutic purpose    Controlled Controlled Controlled   Updated Problems: Problem  Chronic Use of Opiate for Therapeutic Purpose    Plan of Care  Problem-specific:  Assessment and Plan    Chronic low back pain with bilateral sciatica Chronic low back pain radiating to bilateral legs. Insurance requires physical therapy before injections. He is reluctant to undergo physical therapy. - Discuss with Dr. Naveira about alternative pain management options.  Chronic pain syndrome Managed with hydromorphone  4 mg. No side effects, but medication is ineffective.  - Discussed drug holiday to reset efficacy. Considering increasing to four times a day.  Chronic opioid  therapy Current hydromorphone  4 mg regimen. No adverse reactions. Discussed increasing dosage or frequency. Considering four times a day. Patient's pain is controlled with hydromorphone , will continue on current medication regimen.  Prescribing drug monitoring (PDMP) reviewed, findings consistent with the use of prescribed medication and no evidence of narcotic misuse or abuse.  Urine drug screen (UDS) up-to-date.  No side effects or adverse reaction reported to medication.  Schedule follow-up in 90 days for medication management.   Mr. KWABENA STRUTZ has a current medication list which includes the following long-term medication(s): albuterol , amitriptyline , apixaban , azelastine, duloxetine , levothyroxine , loratadine, losartan , mexiletine, pregabalin , simvastatin , [START ON 07/22/2024] hydromorphone , [START ON 08/21/2024] hydromorphone , and [START ON 09/20/2024] hydromorphone .  Pharmacotherapy (Medications Ordered): Meds ordered this encounter  Medications   HYDROmorphone  (DILAUDID ) 4 MG tablet    Sig: Take 1 tablet (4 mg total) by mouth every 8 (eight) hours as needed. Must last 30 days.    Dispense:  90 tablet    Refill:  0    DO NOT: delete (not duplicate); no partial-fill (will deny script to complete), no refill request (F/U required). DISPENSE: 1 day early if closed on fill date. WARN: No CNS-depressants within 8 hrs of med.   HYDROmorphone  (DILAUDID ) 4 MG tablet    Sig:  Take 1 tablet (4 mg total) by mouth every 8 (eight) hours as needed. Must last 30 days.    Dispense:  90 tablet    Refill:  0    DO NOT: delete (not duplicate); no partial-fill (will deny script to complete), no refill request (F/U required). DISPENSE: 1 day early if closed on fill date. WARN: No CNS-depressants within 8 hrs of med.   HYDROmorphone  (DILAUDID ) 4 MG tablet    Sig: Take 1 tablet (4 mg total) by mouth every 8 (eight) hours as needed. Must last 30 days.    Dispense:  90 tablet    Refill:  0    DO NOT: delete (not  duplicate); no partial-fill (will deny script to complete), no refill request (F/U required). DISPENSE: 1 day early if closed on fill date. WARN: No CNS-depressants within 8 hrs of med.   Orders:  No orders of the defined types were placed in this encounter.     Return in about 3 months (around 10/06/2024) for (F2F), (MM), Emmy Blanch NP.    Recent Visits Date Type Provider Dept  04/12/24 Office Visit Wiliam Cauthorn K, NP [facility]  Showing recent visits within past 90 days and meeting all other requirements Today's Visits Date Type Provider Dept  07/08/24 Office Visit Jenita Rayfield K, NP [facility]  Showing today's visits and meeting all other requirements Future Appointments No visits were found meeting these conditions. Showing future appointments within next 90 days and meeting all other requirements  I discussed the assessment and treatment plan with the patient. The patient was provided an opportunity to ask questions and all were answered. The patient agreed with the plan and demonstrated an understanding of the instructions.  Patient advised to call back or seek an in-person evaluation if the symptoms or condition worsens.  I personally spent a total of 30 minutes in the care of the patient today including preparing to see the patient, getting/reviewing separately obtained history, performing a medically appropriate exam/evaluation, counseling and educating, placing orders, referring and communicating with other health care professionals, documenting clinical information in the EHR, independently interpreting results, communicating results, and coordinating care.   Note by: Clotee Schlicker K Zinia Innocent, NP (TTS and AI technology used. I apologize for any typographical errors that were not detected and corrected.) Date: 07/08/2024; Time: 1:37 PM

## 2024-07-08 NOTE — Patient Instructions (Signed)
 ______________________________________________________________________    Opioid Pain Medication Update  To: All patients taking opioid pain medications. (I.e.: hydrocodone , hydromorphone, oxycodone , oxymorphone, morphine, codeine, methadone, tapentadol, tramadol, buprenorphine, fentanyl , etc.)  Re: Updated review of side effects and adverse reactions of opioid analgesics, as well as new information about long term effects of this class of medications.  Direct risks of long-term opioid therapy are not limited to opioid addiction and overdose. Potential medical risks include serious fractures, breathing problems during sleep, hyperalgesia, immunosuppression, chronic constipation, bowel obstruction, myocardial infarction, and tooth decay secondary to xerostomia.  Unpredictable adverse effects that can occur even if you take your medication correctly: Cognitive impairment, respiratory depression, and death. Most people think that if they take their medication correctly, and as instructed, that they will be safe. Nothing could be farther from the truth. In reality, a significant amount of recorded deaths associated with the use of opioids has occurred in individuals that had taken the medication for a long time, and were taking their medication correctly. The following are examples of how this can happen: Patient taking his/her medication for a long time, as instructed, without any side effects, is given a certain antibiotic or another unrelated medication, which in turn triggers a Drug-to-drug interaction leading to disorientation, cognitive impairment, impaired reflexes, respiratory depression or an untoward event leading to serious bodily harm or injury, including death.  Patient taking his/her medication for a long time, as instructed, without any side effects, develops an acute impairment of liver and/or kidney function. This will lead to a rapid inability of the body to breakdown and eliminate  their pain medication, which will result in effects similar to an overdose, but with the same medicine and dose that they had always taken. This again may lead to disorientation, cognitive impairment, impaired reflexes, respiratory depression or an untoward event leading to serious bodily harm or injury, including death.  A similar problem will occur with patients as they grow older and their liver and kidney function begins to decrease as part of the aging process.  Background information: Historically, the original case for using long-term opioid therapy to treat chronic noncancer pain was based on safety assumptions that subsequent experience has called into question. In 1996, the American Pain Society and the American Academy of Pain Medicine issued a consensus statement supporting long-term opioid therapy. This statement acknowledged the dangers of opioid prescribing but concluded that the risk for addiction was low; respiratory depression induced by opioids was short-lived, occurred mainly in opioid-naive patients, and was antagonized by pain; tolerance was not a common problem; and efforts to control diversion should not constrain opioid prescribing. This has now proven to be wrong. Experience regarding the risks for opioid addiction, misuse, and overdose in community practice has failed to support these assumptions.  According to the Centers for Disease Control and Prevention, fatal overdoses involving opioid analgesics have increased sharply over the past decade. Currently, more than 96,700 people die from drug overdoses every year. Opioids are a factor in 7 out of every 10 overdose deaths. Deaths from drug overdose have surpassed motor vehicle accidents as the leading cause of death for individuals between the ages of 69 and 14.  Clinical data suggest that neuroendocrine dysfunction may be very common in both men and women, potentially causing hypogonadism, erectile dysfunction, infertility,  decreased libido, osteoporosis, and depression. Recent studies linked higher opioid dose to increased opioid-related mortality. Controlled observational studies reported that long-term opioid therapy may be associated with increased risk for cardiovascular events. Subsequent  meta-analysis concluded that the safety of long-term opioid therapy in elderly patients has not been proven.   Side Effects and adverse reactions: Common side effects: Drowsiness (sedation). Dizziness. Nausea and vomiting. Constipation. Physical dependence -- Dependence often manifests with withdrawal symptoms when opioids are discontinued or decreased. Tolerance -- As you take repeated doses of opioids, you require increased medication to experience the same effect of pain relief. Respiratory depression -- This can occur in healthy people, especially with higher doses. However, people with COPD, asthma or other lung conditions may be even more susceptible to fatal respiratory impairment.  Uncommon side effects: An increased sensitivity to feeling pain and extreme response to pain (hyperalgesia). Chronic use of opioids can lead to this. Delayed gastric emptying (the process by which the contents of your stomach are moved into your small intestine). Muscle rigidity. Immune system and hormonal dysfunction. Quick, involuntary muscle jerks (myoclonus). Arrhythmia. Itchy skin (pruritus). Dry mouth (xerostomia).  Long-term side effects: Chronic constipation. Sleep-disordered breathing (SDB). Increased risk of bone fractures. Hypothalamic-pituitary-adrenal dysregulation. Increased risk of overdose.  RISKS: Respiratory depression and death: Opioids increase the risk of respiratory depression and death.  Drug-to-drug interactions: Opioids are relatively contraindicated in combination with benzodiazepines, sleep inducers, and other central nervous system depressants. Other classes of medications (i.e.: certain antibiotics  and even over-the-counter medications) may also trigger or induce respiratory depression in some patients.  Medical conditions: Patients with pre-existing respiratory problems are at higher risk of respiratory failure and/or depression when in combination with opioid analgesics. Opioids are relatively contraindicated in some medical conditions such as central sleep apnea.   Fractures and Falls:  Opioids increase the risk and incidence of falls. This is of particular importance in elderly patients.  Endocrine System:  Long-term administration is associated with endocrine abnormalities (endocrinopathies). (Also known as Opioid-induced Endocrinopathy) Influences on both the hypothalamic-pituitary-adrenal axis?and the hypothalamic-pituitary-gonadal axis have been demonstrated with consequent hypogonadism and adrenal insufficiency in both sexes. Hypogonadism and decreased levels of dehydroepiandrosterone sulfate have been reported in men and women. Endocrine effects include: Amenorrhoea in women (abnormal absence of menstruation) Reduced libido in both sexes Decreased sexual function Erectile dysfunction in men Hypogonadisms (decreased testicular function with shrinkage of testicles) Infertility Depression and fatigue Loss of muscle mass Anxiety Depression Immune suppression Hyperalgesia Weight gain Anemia Osteoporosis Patients (particularly women of childbearing age) should avoid opioids. There is insufficient evidence to recommend routine monitoring of asymptomatic patients taking opioids in the long-term for hormonal deficiencies.  Immune System: Human studies have demonstrated that opioids have an immunomodulating effect. These effects are mediated via opioid receptors both on immune effector cells and in the central nervous system. Opioids have been demonstrated to have adverse effects on antimicrobial response and anti-tumour surveillance. Buprenorphine has been demonstrated to have  no impact on immune function.  Opioid Induced Hyperalgesia: Human studies have demonstrated that prolonged use of opioids can lead to a state of abnormal pain sensitivity, sometimes called opioid induced hyperalgesia (OIH). Opioid induced hyperalgesia is not usually seen in the absence of tolerance to opioid analgesia. Clinically, hyperalgesia may be diagnosed if the patient on long-term opioid therapy presents with increased pain. This might be qualitatively and anatomically distinct from pain related to disease progression or to breakthrough pain resulting from development of opioid tolerance. Pain associated with hyperalgesia tends to be more diffuse than the pre-existing pain and less defined in quality. Management of opioid induced hyperalgesia requires opioid dose reduction.  Cancer: Chronic opioid therapy has been associated with an increased risk  of cancer among noncancer patients with chronic pain. This association was more evident in chronic strong opioid users. Chronic opioid consumption causes significant pathological changes in the small intestine and colon. Epidemiological studies have found that there is a link between opium dependence and initiation of gastrointestinal cancers. Cancer is the second leading cause of death after cardiovascular disease. Chronic use of opioids can cause multiple conditions such as GERD, immunosuppression and renal damage as well as carcinogenic effects, which are associated with the incidence of cancers.   Mortality: Long-term opioid use has been associated with increased mortality among patients with chronic non-cancer pain (CNCP).  Prescription of long-acting opioids for chronic noncancer pain was associated with a significantly increased risk of all-cause mortality, including deaths from causes other than overdose.  Reference: Von Korff M, Kolodny A, Deyo RA, Chou R. Long-term opioid therapy reconsidered. Ann Intern Med. 2011 Sep 6;155(5):325-8. doi:  10.7326/[id number]. PMID: 78106373; PMCID: [id number]. Kit JINNY Laurence CINDERELLA Pearley JINNY, Hayward RA, Dunn KM, Jordan KP. Risk of adverse events in patients prescribed long-term opioids: A cohort study in the [place] Clinical Practice Research [employer]. Eur J Pain. 2019 May;23(5):908-922. doi: 10.1002/ejp.1357. Epub 2019 Jan 31. PMID: 69379883. Colameco S, Coren JS, Ciervo CA. Continuous opioid treatment for chronic noncancer pain: a time for moderation in prescribing. Postgrad Med. 2009 Jul;121(4):61-6. doi: 10.3810/pgm.2009.07.2032. PMID: 80358728. Gigi JONELLE Shlomo MILUS Levern IVER Conny RN, [place], Blazina I, Lonell DASEN, Bougatsos C, Deyo RA. The effectiveness and risks of long-term opioid therapy for chronic pain: a systematic review for a [employer] of [employer] Pathways to [employer]. Ann Intern Med. 2015 Feb 17;162(4):276-86. doi: 10.7326/M14-2559. PMID: 74418742. Rory CHRISTELLA Laurence [facility], [place] DM. NCHS Data Brief No. 22. [place]: Centers for Disease Control and Prevention; 2009. Sep, Increase in Fatal Poisonings Involving Opioid Analgesics in the [place] , 1999-2006. Song IA, Choi HR, Oh TK. Long-term opioid use and mortality in patients with chronic non-cancer pain: Ten-year follow-up study in [place] from 2010 through 2019. EClinicalMedicine. 2022 Jul 18;51:101558. doi: 10.1016/j.eclinm.2022.898441. PMID: 64124182; PMCID: [id number]. Huser, W., Schubert, T., Vogelmann, T. et al. All-cause mortality in patients with long-term opioid therapy compared with non-opioid analgesics for chronic non-cancer pain: a database study. BMC Med 18, 162 (2020). [email or web address].info/ Rashidian H, Zendehdel K, Kamangar F, Malekzadeh R, Haghdoost AA. An Ecological Study of the Association between Opiate Use and Incidence of Cancers. Addict Health. 2016 Fall;8(4):252-260. PMID: 71180443; PMCID: [id number].  Our Goal: Our goal is to control your pain with means other  than the use of opioid pain medications.  Our Recommendation: Talk to your physician about coming off of these medications. We can assist you with the tapering down and stopping these medicines. Based on the new information, even if you cannot completely stop the medication, a decrease in the dose may be associated with a lesser risk. Ask for other means of controlling the pain. Decrease or eliminate those factors that significantly contribute to your pain such as smoking, obesity, and a diet heavily tilted towards inflammatory nutrients.  Last Updated: 01/27/2023   ______________________________________________________________________       ______________________________________________________________________    Update on Controlled Substance (Opioid) Regulations   To: All patients taking opioid pain medications. (I.e.: hydrocodone , hydromorphone, oxycodone , oxymorphone, morphine, codeine, methadone, tapentadol, tramadol, buprenorphine, fentanyl , etc.)  Re: Review on the state of controlled substance regulations.  Introduction: Rules and regulations associated with all aspects of controlled substances are constantly being modified. Unfortunately we have encountered patients questioning the veracity of the information  that we provide them about these changes. This is intended to provide them with appropriate references and a historical review of these changes.  A Brief History: As of May 06, 2016, the [place]  Government declared the opioid epidemic a public health emergency. Prescription drug monitoring programs (PDMPs) and the [facility] All Schedules Prescription Electronic Reporting Act (NASPER). Before 1800, clinicians regarded pain as an existential phenomenon, a consequence of aging. There was no regulation on the use of cocaine and opioids, resulting in widespread marketing and prescribing for many ailments ranging from diarrhea to toothache. The [employer] of  [phone], passed in response to the sudden emergence of street heroin abuse as well as iatrogenic morphine dependence, influenced both physician and patient alike to avoid opiates. Patients with unexplained pain in the 1920s were regarded as deluded, malingering, or abusers, and cancer patients through the 1950s were encouraged to wean themselves off opioids until their lives could be measured in weeks. Alongside this opioid evolution, the American Pain Society launched their influential pain as the fifth vital sign campaign in 1995. Concurrently, pharmaceutical companies introduced new formulations, such as extended release oxycodone  (OxyContin ). From 1997 to 2002, OxyContin  prescriptions increased from 670,000 to 6.2 million. However, concerns soon began to surface regarding overzealous opioid treatment. It must be noted that pharmaceutical companies contributed significantly to the rise of the opioid epidemic, receiving considerable reprimands as a consequence. In 2007, as the opioid epidemic began to inflict profound damage, [employer] pleaded guilty to federal charges related to the misbranding of OxyContin . [employer] agreed to pay a total of $634.5 million to resolve Justice Department investigations, as well as a $19.5 million settlement to [address] and the [address] of [place].  In response to the current epidemic, changes in focus to the development of new abuse deterrent opioid formulations at the [place]  Food and Drug Administration (FDA) as well as drafting of new public standards for pain treatment were created at [facility] in 2017. In response to the opioid epidemic, FDA public policy changes were announced in February 2016. Among these new positions were a re-examination of the risk-benefit paradigm for opioids with strict emphasis on the large public health ramifications. The various modified opioids released over the past 20 years, such as tamper-resistant preparation, have had differing levels of success,  and are collectively referred to as Risk Evaluation and Mitigation Strategies (REMS). There is also a growing focus on preventing opioid use disorder (OUD) and on offering affected individuals accessible and effective treatment. [place]  government policy reflects these changes and both the Affordable Care Act and the Mental Health Parity and Addiction Equity Act were major steps forward in treating opioid addiction. The Affordable Care Act, which was signed into law in 2010, with major provisions coming into effect by 2014.  In the 1990s, the intensified marketing of newly reformulated prescription opioid medications (e.g., OxyContin ) and an influential pain advocacy campaign that encouraged greater pain management led to a precipitous rise in opioid use in the [place] . Research from the Centers for Disease Control and Prevention (CDC) shows that prescription opioid sales in the [place]  quadrupled from 1999 to 2010. At the same time, opioid misuse and opioid-involved overdose deaths increased (Figure 1). Between 1999 and 2010, the rate of opioidinvolved overdose deaths in the [place]  doubled from 2.9 to 6.8 deaths per 100,000 people. This initial rise in opioid-related deaths is often referred to as the first wave of the recent opioid crisis.  Between 1999 and 2020, 565,000  Americans died of opioid-involved overdoses. In turn, federal, state, and local governments responded with various legal and policy efforts to curb opioid misuse and drug-related overdose Deaths.  Recent Congresses have enacted several laws addressing the opioid crisis, such as the Comprehensive Addiction and Recovery Act of 2016 (CARA, P.L. 114-198); the 21st Century Cures Act (P.L. 114-255); the Substance UseDisorder Prevention that Promotes Opioid Recovery and Treatment for Patients and Communities Act (SUPPORT Act, P.L. [phone]); the Fentanyl  Sanctions Act (Title LXXII of P.L. [id number]); and the Blocking  Deadly Fentanyl  Imports Act (P.L. 117-81, 6610). These laws addressed overprescribing and misuse of opioids, expanded substance use disorder prevention and treatment capacities, bolstered drug diversion capabilities, and enhanced international drug interdiction, counternarcotics cooperation, and sanctions efforts. Congress also directed additional funds to many of these initiatives through appropriations.  Congress provided funding in the [employer] Act of 2021 [phone]; P.L. 117-2) for syringe services programs (often known as needle exchange programs) and other harm reduction initiatives. Federal and state harm reduction strategies have frequently involved the distribution of naloxone  (e.g., Narcan )--a medication used to reverse an opioid overdose--and test strips used to detect fentanyl  in drug samples.  The Department of Justice (DOJ) and Department of Homeland Security [facility]) aim to reduce the diversion of prescription opioids and the use, manufacturing, and trafficking of illicit opioids. DOJ--via the Drug Enforcement Administration (DEA)--regulates opioid manufacturers, distributors, and dispensers; it also controls the opioid supply through enforcement of regulatory requirements.  A History of Opiate Laws in the [place]   Prior to 1890, laws concerning opiates were strictly imposed on a local city or state-by-state basis. One of the first was in [place] in 1875 where it became illegal to smoke opium only in opium dens. It did not ban the sale, import or use otherwise. In the next 25 years different states enacted opium laws ranging from outlawing opium dens altogether to making possession of opium, morphine and heroin without a physicians prescription illegal.  The first Congressional Act took place in 1890 that levied taxes on morphine and opium. From that time on the [employer] has had a series of laws and acts directly aimed at opiate use, abuse  and control. These are outlined below:  1906 - Pure Food and Drug Act Preventing the manufacture, sale, or transportation of adulterated or misbranded or poisonous or deleterious foods, drugs, medicines, and liquors, and for regulating traffic therein, and for other purposes. Punishment included fines and prison time.  1909   - Smoking Opium Exclusion Act Banned the importation, possession and use of smoking opium. Did not regulate opium-based medications. First Freight forwarder banning the non-medical use of a substance.  1914  - The Margrette Act In summary, The Margrette Act of 1914 was written more to have all parties involved in importing, exporting, set designer and distributing opium or cocaine to register with the [employer] and have taxes levied upon them. Exempt from the law were physicians operating in the course of his professional practice  1919 - Supreme Court ratified the Bj's in [place]., v. [place]  and [place]  v. [place], then again in [facility] v. [place] , in 1920, holding that doctors may not prescribe maintenance supplies of narcotics to people addicted to narcotics. However, it does not prohibit doctors from prescribing narcotics to wean a patient off of the drug. It was also the opinion of the court that prescribing narcotics to habitual users was not considered professional practice hence it  then was considered illegal for doctors to prescribe opioids for the purposes of maintaining an addiction. It can be argued that todays addiction medications are not intended to maintain an addiction but to facilitate addiction remission. In which case, this opinion of the court should not preclude practitioners from prescribing buprenorphine or methadone to patients suffering from an addictive disorder.  1924  - Heroin Act Architectural technologist, importation and possession of heroin illegal - even for medicinal use.  1922 -- Narcotic  Drug Import and Export Act Enacted to assure proper control of importation, sale, possession, production and consumption of narcotics.  1927  -- Special Educational Needs Teacher of [employer] of Prohibition was responsible for tracking bootleggers and organized conservation officer, historic buildings. They focused primarily on interstate and international cases and those cases where local law enforcement official would not or could not act.  1932 -- Uniform State Narcotic Act Encouraged states to pass uniform state laws matching the federal Narcotic Drug Import and Export Act. Suggested prohibiting cannabis use at the state level.  27 -- Food, Drug, and Cosmetic Act The new law brought cosmetics and medical devices under control, and it required that drugs be labeled with adequate directions for safe use. Moreover, it mandated pre-market approval of all new drugs, such that a manufacturer would have to prove to FDA that a drug were safe before it could be sold  1951 -- Boggs Act Imposed maximum criminal penalties for violations of the import/export and internal revenue laws related to drugs and also established mandatory minimum prison sentences.  1956 -- Narcotics Control Act Increased Boggs Act penalties and mandatory prison sentence minimums for violations of existing drug laws.  1965 -- Drug Abuse Control Amendment Enacted to deal with problems caused by abuse of depressants, stimulants and hallucinogens. Restricted research into psychoactive drugs such as LSD by requiring FDA approval.  1970 -- Controlled Substance Act  Controlled Substances Import and Export Act These laws are a consolidation of numerous laws regulating the manufacture and distribution of narcotics, stimulants, depressants, hallucinogens, anabolic steroids, and chemicals used in the illicit production of controlled substances. The CSA places all substances that are regulated under existing federal law into one of five schedules. This placement is based upon  the substance's medicinal value, harmfulness, and potential for abuse or addiction. Schedule I is reserved for the most dangerous drugs that have no recognized medical use, while Schedule V is the classification used for the least dangerous drugs. The act also provides a mechanism for substances to be controlled, added to a schedule, decontrolled, removed from control, rescheduled, or transferred from one schedule to another.  82 - Drug Enforcement Agency By Executive Order, the DEA was formed to take place of the [employer] of Narcotics and Dangerous Drugs.  46 -Narcotic Addict Treatment Act of  1974  - Public Law [phone] Amends the Controlled Substance Act of 1970 to provide for the registration of practitioners conducting narcotic treatment programs. [methadone clinics] It also provides legal definitions for the phrases maintenance treatment and detoxification treatment.  1986 -- Anti-Drug Abuse Act of 1986 Strengthened Federal efforts to encourage foreign cooperation in eradicating illicit drug crops and in halting international drug traffic, to improve enforcement of Federal drug laws and enhance interdiction of illicit drug shipments, to provide strong Market researcher in establishing effective drug abuse prevention and education programs, to [employer] support for drug abuse treatment and rehabilitation efforts, and for other purposes. It also re-imposed mandatory sentencing minimums depending on which drug and how  much was involved.  1988 -- Anti-Drug Abuse Act of 1988 Established the Office of Materials Engineer (ONDCP) in the [employer] of the Economist; authorized funds for [employer], state and local drug enforcement activities, school-based drug prevention efforts, and drug abuse treatment with special emphasis on injecting drug abusers at high risk for AIDS.  2000 -- Federal - The Drug Addiction Treatment Act of 2000 (DATA 2000) It enables qualified physicians to  prescribe and/or dispense narcotics for the purpose of treating opioid dependency. For the first time, physicians are able to treat this disease from their private offices or other clinical settings. This presents a very desirable treatment option for those who are unwilling or unable to seek help in drug treatment clinics. Patients can now be treated in the privacy of their doctors office, as are other people being treated for any other type of medical condition. One medicine doctors may now prescribe is Buprenorphine. The major downfall of this Act is the limitation of 30 patients per practice - which means that large facilities, no matter how many physicians are there, can only treat 30 patients at a time.  2002-- DEA reschedules buprenorphine from a schedule V drug to a schedule III drug, on April 27, 2001 - the day before the FDA approval of Suboxone and Subutex despite overwhelming objection by the medical community.  2004: June 2004 THE CONFIDENTIALITY OF ALCOHOL  AND DRUG ABUSE PATIENT RECORDS REGULATION AND THE HIPAA PRIVACY RULE:  Confidentiality of Alcohol  and Drug Dependence Patient Records (summary) Code of Federal Regulations Title 42 Part 2 (42 CFR Part 2)  The confidentiality of alcohol  and drug dependence patient records maintained by this practice/program is protected by federal law and regulations. Generally, the practice/program may not say to a person outside the practice/program that a patient attends the practice/program, or disclose any information identifying a patient as being alcohol  or drug dependent unless:  The patient consents in writing; The disclosure is allowed by a court order, or The disclosure is made to medical personnel in a medical emergency or to qualified personnel for research,  audit, or practice/program evaluation. Violation of the federal law and regulations by a practice/program is a crime. Suspected violations may be reported to appropriate authorities  in accordance with federal regulations. Freight forwarder and regulations do not protect any information about a crime committed by a patient either at the practice/program or against any person who works for the practice/program or about any threat to commit such a crime. Federal laws and regulations do not protect any information about suspected child abuse or neglect from being reported under state law to appropriate state or local authorities.  sample consent form (MS-WORD)  2005: 02-21-2004 Public law [id number], Amends the Controlled Substances Act to eliminate the 30-patient limit for medical group practices allowed to dispense narcotic drugs in schedules III, IV, or V for maintenance or detoxification treatment (retains the 30-patient limit for an individual physician). This amendment removes the 30-patient limit on group medical practices that treat opioid dependence with buprenorphine. The restriction was part of the original Drug Addiction Treatment Act of 2000 (DATA) that allowed treatment of opioid dependence in a doctor's office. With this change, every certified doctor may now prescribe buprenorphine up to his or her individual physician limit of 30 patients.  2006: On 07/19/2005 President Levy signed Bill H.R.6344 into law. This allows physicians who have been certified to prescribe certain drugs for the treatment of opioid dependence under DATA2000 to treat up to  100 patients (up from 30) by submitting an intent notification to the Dept of Health and [employer]. This is a major step forward in both fighting the stigma and allowing access to treatment previously not available to some. For more details see 30/100-PATIENT LIMIT  2016: HHS augments regulations concerning the 30/100 patient limit by raising the limit to 275 for qualifying physicians. Link to summary of regulation  2016: Comprehensive Addiction and Recovery Act of 2016 (sec.303) amends the Controlled Substance Act - to allow Nurse  Practitioners and Physician Assistants to become eligible to prescribe buprenorphine for the treatment of opioid use disorder. See the entire law for more details.  The roots of the concurrent regulation of certain drugs under two statutory schemes go back to the beginning of this century. In 1906, Congress enacted the Pure Food and Drug Act, establishing one regime of regulation to assure (among other things) that drugs were not adulterated or misbranded. These regulations were amended several times, recodified in 1938, and expanded on again from the 1940s through the 1990s. Their implementation and enforcement is today assigned to the Food and Drug Administration (FDA) in the Department of Health and Human Services [facility]).  In 1914, Congress adopted the [place] Narcotic Act to stop abuse of addictive drugs. The [place] Narcotic Act was amended in 1937 to include marijuana. In 1965, amphetamines, barbiturates, and hallucinogens came under regulation, but under the [employer], Drug, and Cosmetic Act. In 1970, these various statutes were consolidated and recodified as the Controlled Substances Act (CSA), which has been amended several times since then. Its implementation and enforcement is today assigned to the Drug Enforcement Administration (DEA) in the Department of Justice.  The first clash occurred after World War I, when so-called morphine clinics existed and physicians prescribed or dispensed morphine to addicts. Some addicts were veterans of the American Civil War, the Spanish-American War, and WWI, who had become addicted during treatment for war wounds, but most of them came from the growing population of nonmedical addicts ([place], 8017). The Narcotics Division of the Prohibition Unit of the Department of the Treasury, which was then responsible for enforcing the [facility] Narcotic Act, concluded that this activity was not the legitimate practice of medicine but simple drug trafficking. The  Treasury Department swiftly closed the clinics and made it personally and professionally risky for physicians to maintain a narcotic addict for any reason. In did so, however, only after the American Medical Association had adopted a resolution, in 1920, opposing ambulatory clinics''.  In 1972, the public health establishment, including the Secretary of Health, Education, and Welfare, the Education Officer, Environmental, the [employer] of [employer], and the Chemical Engineer for Drug Abuse Prevention, was unprepared to allow [employer] of Narcotics and Dangerous Drugs, DEA's predecessor agency, to unilaterally define the parameters of medical practice for the use of methadone in the treatment of heroin addiction. As a consequence, a new set of rules--the third, on top of the FDA and DEA schemes--was added, one that inserted FDA deeply into the practice of medicine, notwithstanding its protestations to the contrary. Congress ratified this joint responsibility of law enforcement and public health officials for methadone through this third set of rules in 1974 with the passage of the Narcotic Addict Treatment Act (NATA). To examine in detail the evolution of this third set of rules--commonly referred to as the FDA or DHHS methadone regulations--we turn, first, to the period of the mid-1960s.  Increased use of heroin in the  post World War II period first became apparent in the early to mid 1950s. During the [employer], a minimum mandatory narcotics law was enacted in 1956, effective July 1957. 1962 [facility] conference on drug abuse, the [employer] on Narcotic and Drug Abuse (the [employer]) of 1963, the Drug Abuse Control Amendments of 1965, the President's Commission on [employer] and Administration of Justice (the [employer]) of [phone], and the Narcotic Addiction Rehabilitation Act of 1966.  The 1965 Drug Abuse Control Amendments  brought under strict federal control all nonnarcotic drugs capable of producing serious psychotoxic effects when abused. This act also created the [employer] of Drug Abuse Control within the Department of Health, Education, and Welfare (DHEW) and shifted the basis for aon corporation of illegal drugs from tax principles (administered by the Department of Treasury) to the regulation of commerce (administered by the [employer]ATION).  The 1966 Narcotic Addiction Rehabilitation Act TOUR MANAGER) authorized the civil commitment of narcotic addicts, and federal assistance to state and local governments to develop a local system of drug treatment programs. With respect to the latter, the [employer] of Mental Health [facility]) initially proposed the gradual implementation of the state assistance effort, mainly through a common mental health mechanism--inpatient treatment programs. However, because of a perceived pressing need, the courts began to commit addicts to these programs even before they were officially opened or staffed. The NARA legislation imposed the following contract requirements on treatment centers: (1) thrice-a-week counseling sessions; (2) weekly urine tests; (3) restorative dental services; (4) psychological consultations and vocational training; and (5) the treatment modalities of drug-free outpatient, therapeutic community, and methadone maintenance. Reorganization Plan No. 1 of 1968 transferred the primary functions of the [employer] of Narcotics (FBN) from the [employer] to the Department of Justice; it also transferred the [employer] of Drug Abuse Control functions to the Department of Justice. Within the [employer], the [employer] of Narcotics and Dangerous Drugs (BNDD) was created, which became the Drug Enforcement Administration in 1973.   Under the first [place] administration [phone]), federal drug abuse policy developed in a significant way. These developments included a 1969 war  on drugs presidential message, resulting legislation in 1970, and a Special Action Office created by executive order in 1971 and authorized in statute in 1972. Brynn, in 1969, to send a message to Congress on drug abuse. Although this was the first time that a U.S. president invoked the war on drugs image, it was in retrospect the most balanced approach to the problem of drug abuse that had been advanced. The 1969 message resulted in the submission of legislation to the Congress and the passage, the following year, of the Comprehensive Drug Abuse Prevention and Control Act of 1970 [employer] [phone], May 17, 1969). The act dealt with research, treatment, and prevention of drug abuse and drug dependence, and with drug abuse charity fundraiser. One major purpose of the 1970 legislation was to reverse some of the strictures of the [employer] of 1914. The 1970 act sought to clarify for the medical profession . . . the extent to which they may safely go in treating narcotic addicts as patients. Title I, in Section IV, charged the Surveyor, Minerals, Education, and Welfare, to determine the appropriate methods of professional practice in the medical treatment of the narcotic addiction of various classes of narcotic addicts. This provision constitutes the initial statutory basis for treatment standards. The law enforcement sections consolidated all prior federal statutes into the  Controlled Substances Act and the Controlled Substances Export and Import Act (Titles II and III, respectively, of the Comprehensive Drug Abuse Prevention and Control Act of 1970). Under this legislation, substances were classified under five schedules according to their abuse potential, and psychological and physical effects. Methadone was placed in Schedule II, along with such opiate drugs as morphine, codeine, and hydrocodone .  One of the most important steps taken by President Brynn was to establish in June 1971 the  Special Action Office for Drug Abuse Prevention (SAODAP) in the [employer] of the President ([place] [phone], January 05, 1970). In mid-1971, [facility] appointed Dr. Maple Dunnings as SAODAP director. Within a year, the Drug Abuse Prevention Office and Treatment Act of 1972 [employer] [phone], October 10, 1970) gave statutory authority to [facility], but limiting setting, on January 18, 1974, as the limit on its existence.  The purpose of the 1972 act was to bring the resources of the federal government to bear on drug abuse with the immediate objective of significantly reducing its incidence and developing a comprehensive, coordinated long-term federal strategy to combat drug abuse.  Narcotic Addict Treatment Act [employer]) of 1974 [employer] [phone]), which amended the Controlled Substances Act. This legislation was driven by concern for the diversion of methadone to illicit channels that was occurring in 1972 and 1973, as reflected in the title of the Senate bill adopted on December 28, 1971, the Methadone Diversion Control Act of 1973. (U.S. Senate, 1970a, 8029a).  The 1980 final rule (45 FR 37305, April 10, 1979) reduced the minimum standard for admission from two years of addiction to one year coupled with a clinical determination that the individual was currently physiologically.  The regulations were next revised in 1989, following two proposals to modify them, one in 1983 and one in 1987.  Under President Tanda Corrente, a government-wide effort was made to review all federal government regulations and to eliminate or reduce the burden of these regulations on the private sector, state and nash-finch company, and wps resources.   The 1983 recommendations, though not adopted, did initiate another revision of the methadone regulations, which first found expression in a 1987 proposed rule (52 FR 37047, April 22, 1986) and culminated in a final rule (54 FR 8954, September 21, 1987) at the end of the  decade. In the 1987 proposed rule, the FDA and NIDA, in an effort to put the best face on the unenthusiastic 1983 response by the provider community to converting the regulations to guidelines, indicated that they had retained the current requirements necessary to achieve the goals of the 1974 NATA, but were proposing to streamline the regulation and to promote more efficient operation of methadone programs. The 1987 proposed rule, issued by the FDA and NIDA, advanced the following changes in the methadone regulation: that detoxification treatment be divided into short-term (<21 days) and long-term (>21 and <180 days) treatment; that the minimum staffing ratio of one counselor to 50 patients be eliminated; that blood tests be allowed as ways to conduct initial drug screening or to meet the monthly testing requirements for six-day take-home patients; that the 72-hour notification of FDA and the pertinent state authority for methadone doses greater than 100 mg be eliminated; that special adverse reaction reporting requirements for methadone be eliminated and reliance placed upon general FDA reporting requirements; that a supervising counselor be allowed to conduct the annual review of the patient's treatment plan for certain qualified patients who had been in treatment for 3 years  or longer; and that the requirement of an annual report of methadone treatment programs to the FDA be dropped. The FDA and NIDA issued a final rule on September 21, 1987, based on comments on the 1987 proposed (54 FR 8954). Concurrently, FDA and NIDA issued a six-page guidance document, which noted that the regulations, over time, had recommended certain practices that were not actually required. Public Health Service, in Congress, and elsewhere, to reorganize the Alcohol , Drug Abuse, and Mental Health Administration (ADAMHA). These efforts culminated in the [employer] of 1992 [employer] [phone], January 29, 1991), the main  purpose of which was to transfer the research portions of the three ADAMHA institutes--NIDA, the [employer] Mills of Alcoholism and Alcohol  Abuse, and the [employer] Mills of Mental Health--to the [employer] and to create the Substance Abuse and Mental Health Services Administration [facility]) as the home for the service functions of these entitles.  Guidelines for Opioid Treatment The Federal Guidelines for Opioid Treatment Programs - 2015 serve as a guide to accrediting organizations for developing accreditation standards. The guidelines also provide OTPs with information on how programs can achieve and maintain compliance with federal regulations. The 2015 guidelines are an update to the 2007 Guidelines for the Accreditation of Opioid Treatment Programs (PDF  547 KB). The new document reflects the obligation of OTPs to deliver care consistent with the patient-centered, integrated, and recovery-oriented standards of substance use treatment.  DPT oversees the certification of OTPs and provides guidance to nonprofit organizations and state governmental entities that want to become a SAMHSA-approved accrediting body. Learn more about the accreditation and certification of OTPs and [facility] oversight of OTP accreditation bodies.  Model Guidelines for [employer] With input from [facility], the Federation of [employer]son in 2013 adopted a revised version of the federations office-based opioid treatment policies. The Model Policy on DATA 2000 and Treatment of Opioid Addiction in the Medical Office - 2013 (PDF  279 KB) provides model guidelines for use by state medical boards in regulating office-based opioid treatment.  Holiday Guidance for Opioid Treatment Programs (PDF  203 KB) In response to requests for the upcoming federal holidays and ensuing weekends (December 24th, 25th, and 26th and December 31st, Jan 1st, and Jan 2nd), this letter is to provide guidance  regarding requests for unsupervised doses of medication for patients for these dates. View a sample [id number] (PDF  194 KB).  Federal regulation of drugs emerged as early as 6, under a law that addressed only imported drugs. In 1905 the [employer] launched a private, voluntary means of controlling a substantial part of the drug marketplace, a system that remained in place for over a half-century. Drug regulation in FDA has evolved considerably since President Ricardo Para signed the 1906 Pure Food and Drugs Act.  1820 Eleven doctors set up the U.S. Pharmacopeia and record the first list of standard drugs. 1848 Drug Importation Act passed by Congress requires U.S. Customs Service inspection to stop entry of tainted, low quality drugs from overseas. 8116 Dr. Mitchell MICAEL Burrs becomes the chief chemist at the [facility] of [employer] adulteration studies.  1905 The American Medical Association [facility]) begins a voluntary program of drug approval that would last until 1955. In order to advertise in the [facility] and related journals, drug companies must show proof that the drug will treat what they claim. 1906 The original Food and Drug Act is passed by Congress on June 30 and signed by [employer].  The Act outlaws states from buying and selling food, drinks, and drugs that have been mislabeled and tainted. 1911 In U.S. v. Vicci, the [employer] that the [employer] and Drugs Act does not outlaw false medical claims but only false and misleading statements about the ingredients or identity of a drug. 1912 Congress passes the Romney Amendment to overcome the ruling in U.S. v. Vicci. The Act outlaws labeling medicines with fake medical claims that is meant to trick the buyer. 1930 The name of the Food, Drug, and Insecticide Administration is shortened to Food and Drug Administration (FDA) under an therapist, music. 1933 FDA  recommends a total rewrite of the out-of-date 1906 Food and Drugs Act.   1937 Elixir Sulfanilamide, contain the poisonous liquid, diethylene glycol, kills 107 persons, many of whom are children, dramatizing the need to establish drug safety before marketing and to pass the pending food and drug law. 1938 Congress passes [employer], Drug, and Cosmetic (FDC) Act of 1938, which requires that new drugs show safety before selling. This starts a new system of drug regulation. The Act also requires that safe limits be set for unavoidable poisonous matter and allows for factory inspections. The Directv is given power to oversee advertising for all FDAregulated products except prescription drugs. FDA states that sulfanilamide and other dangerous drugs must be given under the direction of a medical expert. This begins the requirement for prescription only (nonnarcotic) drugs (see 1951 Merced-Humphrey amendment). 1941 Nearly 300 deaths and injuries result from the use of sulfathiazole tablets, an antibiotic, tainted with the sedative, phenobarbital. In response, FDA drastically changes manufacturing and quality controls. These changes lead to the development of good manufacturing practices (GMPs). 1948 The [employer] in U.S. v. Floretta that FDA jurisdiction extends to retail stores, thereby allowing FDA to stop illegal sales of drugs by pharmacies including barbiturates and amphetamines. 1950 In Walgreen. v. U.S., a U.S. Court of Appeals rules that the directions for use on a drug label must include the drugs purpose. 1951 Congress passes the Hayes-Humphrey Amendment, which defines the kinds of drugs that cannot be used safely without medical supervision. The amendment limits sale of these drugs to prescription only by a medical professional. All other drugs are to be available without a prescription. 1952 A nationwide investigation by FDA  reveals that chloramphenicol, an antibiotic, caused nearly 180 cases of often deadly blood diseases. Two years later FDA engages the [employer] of Hospital Pharmacists, the American Association of Medical Record Librarians, and later the American Medical Association in a voluntary program of drug reaction reporting. 1953 The [employer] Amendment clarifies previous law and requires FDA to give manufacturers written reports of conditions seen during inspections and results of factory samples. 1962 Thalidomide, a new sleeping pill, causes severe birth defects of the arms and legs in thousands of babies born in [place]. The U.S. media reports on how Dr. Cathlean Mort, a FDA medical officer, helped prevent approval and marketing of Thalidomide in the [place] . These reports stirred up public support for stronger drug laws. 3 Congress passes the [employer]. For the first time, these laws require drug makers to prove their drug works before FDA can approve them for sale. The Advisory Committee on Investigational Drugs meets for the first time. This was the first meeting of a committee to advise FDA on product approval and policy on an ongoing basis. 1966 FDA contracts with the [facility]  of [employer] to measure the effectiveness of 4,000 marketed drugs approved on the basis of safety alone between [id number] and 07/22/1961. The Fair Packaging and Labeling Act requires all consumer products, in interstate commerce, to be honestly and informatively labeled. 07/23/67 FDA forms the Drug Efficacy Study Implementation (DESI) to carry out recommendations of the [employer] of the effectiveness of drugs first sold between [place] and 01-Jan-1963Jan 01, 1971 FDA requires the first patient package insert, medicines must come with information for the patient about risks and benefits. 1972 Over-the-Counter Drug Review begins  to enhance the safety, effectiveness and appropriate labeling of drugs sold without prescription. 1973 The U.S. Supreme Court upholds the [place] drug effectiveness law and approves FDAs action to control entire classes of products. 1982 FDA issues Tamper-resistant Packaging Regulations to prevent poisonings such as deaths from cyanide placed in Tylenol  capsules. Congress passes the [employer] in 1982/07/22, making it a crime to tamper with packaged consumer products. 1983/07/23 Drug Price Advertising Account Planner Act (Hatch-Waxman Act) increases the availability of less costly generic drugs by allowing FDA to approve applications for generic versions of brand-name drugs without repeating the research that proved the safety and effectiveness of the brand-name drugs. The Act also allowed brand-name companies to apply for up to five years additional patent protection for the new medicines they developed to make up for time lost while their products were going through FDA's approval process. 1989 The FDA issued guidelines asking drug makers to decide if a drug is likely to have usefulness in elderly people and to include elderly people in studies when applicable. 1991 In 07/22/80, the FDA and the Department of Health and Human Services published a policy on protecting people in research. In 07/22/90, this policy is adopted by more than a dozen federal agencies involved in human subject research and becomes known as the Common Rule. 4 1993 FDA launches MedWatch, a system designed to collect reports from health professionals on problems with drugs and other medical products. FDA issues guidelines for measuring gender differences in responses to medication. Drug companies are encouraged to include patients of both sexes in their research of drugs and to study any gender-specific effects. 1995 FDA declares cigarettes to be drug delivery devices. Limits are issued on marketing and  sales to reduce smoking by young people. 1998 FDA introduces the Adverse Event Reporting System (AERS), a computerized database designed to store and study safety reports on already marketed drugs.  The Demographic Rule requires that a marketing application review data on safety and effectiveness by age, gender, and race. The Pediatric Rule requires drug makers of selected new and existing drugs to conduct studies on drug safety and effectiveness in children. 1999 Creation of the Drug Facts Label for OTC drug products. The law requires all overthe-counter drug labels to have information in a standard format. These drug facts labels are designed to give the user easy-to-find information. 2000 The U. S. [employer], upholds an earlier decision from The [employer] and Drug Administration v. [employer]. et al. and rules 5-4 that FDA does not have authority to regulate tobacco as a drug. 07-22-01 The Best Pharmaceuticals for Children Act, in exchange for studying the drug in children, the drug maker gets six months of selling their product without competition. 07/22/2002 The Pediatric Research Equity Act gives FDA the right to ask drug companies to study the effectiveness of new drugs in children. 2003/07/23 FDA advises medical professionals to  limit the use of a pain reliever called Cox-2, a nonsteroidal anti-inflammatory drug (NSAIDs). Studies had shown that long-term use raised chances of heart attacks and strokes. The warning is also added to the over-thecounter NSAIDs Drug Facts label. Medicines used in hospitals must have a bar code to prevent patients from receiving the wrong medicine. 5 2005 The Drug Safety Board is formed, consisting of FDA staff and representatives from the [employer] of [address] and the [employer]. The Board advises the Director, Center for Drug Evaluation and Research, FDA, on drug safety issues and works with the agency in sharing safety  information to health professionals and patients.  The [place]  Food and Drug Administration (FDA) was first created to enforce the Pure Food and Drug Act of 1906. In this capacity, the FDA is charged with protecting the health of the [place]  public, to ensure the quality of its food, medicine, and cosmetics. Before this time, the [place]  government had no formal oversight of these products and left issues of quality and purity to the individual manufactures, or at times, individual states.    Review: [place] Stop ACT. (The Strengthen Opioid Misuse Prevention (STOP) Act of 2017). GENERAL ASSEMBLY OF [place]  SESSION 2017 SESSION LAW 2017-74 HOUSE BILL 243  PMP mandatory The dispenser shall report: (1) The dispenser's DEA number. (2) The name of the patient for whom the controlled substance is being dispensed, and the patient's: a. Full address, including city, state, and zip code, b. Telephone number, and c. Date of birth. (3) The date the prescription was written. (4) The date the prescription was filled. (5) The prescription number. (6) Whether the prescription is new or a refill. (7) Metric quantity of the dispensed drug. (8) Estimated days of supply of dispensed drug, if provided to the dispenser. (9) National Drug Code of dispensed drug. (10) Prescriber's DEA number. (11) Method of payment for the prescription.  No paper prescriptions  Duration of scripts Acute vs Chronic prescribing  2016 CDC Guidelines for prescribing Opioids for Chronic Pain. (Updated in 2022.) Medical Board  Laws:  Prescription Laws Drug laws, rules, and regulations are constantly changing. Any attempt to summarize them would quickly become outdated. Because of that, the Board encourages practitioners who seek guidance on prescribing procedures to refer to the sources listed below in addition to the Boards position statements, rules and Medical Practice Act.  [place]  Board of Pharmacy  (NCBOP) (which offers the states pharmacy laws and rules, and links to the Code of Federal Regulations) [employer] Site: www.ncbop.org  [place]  General Statutes General Web Site: politicalpool.cz See: [place]  Food, Drug, and Cosmetic Act: S293155 & 106-134 See: [place]  Pharmacy Practice Act, Article 4A: [phone] See: [place]  Controlled Substances Act, Article 5: 90-86 & 90-113.8 See: Use of controlled substances to render one mentally incapacitated or physically helpless: [facility]. Code, Title 21, Food & Drugs www.deadiversion.usdoj.gov Controlled Substances Schedules www.deadiversion.usdoj.gov Drug Warehouse Manager - www.deadiversion.usdoj.gov 42 CFR  8.12 - Federal opioid treatment standards.   Effective March 17, 2016, prior approval will be required for opioid analgesic doses for [facility]. Medicaid and [facility]) beneficiaries which:  Exceed 120 mg of morphine equivalents (MME) per day  Are greater than a 14-day supply of any opioid, or,  Are non-preferred opioid products on the [place] Medicaid Preferred Drug List (PDL)  FEDERAL 42 CFR  8.12 - Federal opioid treatment standards. Title II of the Comprehensive Drug Abuse  Prevention and Control Act of 1970, commonly known as the Controlled Substance Act (CSA) Title 21 [place]  Code ([place]) Controlled Substances Act.   Reference:   ______________________________________________________________________       ______________________________________________________________________    Medication Rules  Purpose: To inform patients, and their family members, of our medication rules and regulations.  Applies to: All patients receiving prescriptions from our practice (written or electronic).  Pharmacy of record: This is the pharmacy where your electronic prescriptions will be sent. Make sure we have the correct one.  Electronic  prescriptions: In compliance with the [place]  Strengthen Opioid Misuse Prevention (STOP) Act of 2017 (Session Law 2017-74/H243), effective July 22, 2018, all controlled substances must be electronically prescribed. Written prescriptions, faxing, or calling prescriptions to a pharmacy will no longer be done.  Prescription refills: These will be provided only during in-person appointments. No medications will be renewed without a face-to-face evaluation with your provider. Applies to all prescriptions.  NOTE: The following applies primarily to controlled substances (Opioid* Pain Medications).   Type of encounter (visit): For patients receiving controlled substances, face-to-face visits are required. (Not an option and not up to the patient.)  Patient's Responsibilities: Pain Pills: Bring all pain pills to every appointment (except for procedure appointments). Pill counts are required.  Pill Bottles: Bring pills in original pharmacy bottle. Bring bottle, even if empty. Always bring the bottle of the most recent fill.  Medication refills: You are responsible for knowing and keeping track of what medications you are taking and when is it that you will need a refill. The day before your appointment: write a list of all prescriptions that need to be refilled. The day of the appointment: give the list to the admitting nurse. Prescriptions will be written only during appointments. No prescriptions will be written on procedure days. If you forget a medication: it will not be Called in, Faxed, or electronically sent. You will need to get another appointment to get these prescribed. No early refills. Do not call asking to have your prescription filled early. Partial  or short prescriptions: Occasionally your pharmacy may not have enough pills to fill your prescription.  NEVER ACCEPT a partial fill or a prescription that is short of the total amount of pills that you were prescribed.  With  controlled substances the law allows 72 hours for the pharmacy to complete the prescription.  If the prescription is not completed within 72 hours, the pharmacist will require a new prescription to be written. This means that you will be short on your medicine and we WILL NOT send another prescription to complete your original prescription.  Instead, request the pharmacy to send a carrier to a nearby branch to get enough medication to provide you with your full prescription. Prescription Accuracy: You are responsible for carefully inspecting your prescriptions before leaving our office. Have the discharge nurse carefully go over each prescription with you, before taking them home. Make sure that your name is accurately spelled, that your address is correct. Check the name and dose of your medication to make sure it is accurate. Check the number of pills, and the written instructions to make sure they are clear and accurate. Make sure that you are given enough medication to last until your next medication refill appointment. Taking Medication: Take medication as prescribed. When it comes to controlled substances, taking less pills or less frequently than prescribed is permitted and encouraged. Never take more pills than instructed. Never take the medication more frequently than  prescribed.  Inform other Doctors: Always inform, all of your healthcare providers, of all the medications you take. Pain Medication from other Providers: You are not allowed to accept any additional pain medication from any other Doctor or Healthcare provider. There are two exceptions to this rule. (see below) In the event that you require additional pain medication, you are responsible for notifying us , as stated below. Cough Medicine: Often these contain an opioid, such as codeine or hydrocodone . Never accept or take cough medicine containing these opioids if you are already taking an opioid* medication. The combination may cause  respiratory failure and death. Medication Agreement: You are responsible for carefully reading and following our Medication Agreement. This must be signed before receiving any prescriptions from our practice. Safely store a copy of your signed Agreement. Violations to the Agreement will result in no further prescriptions. (Additional copies of our Medication Agreement are available upon request.) Laws, Rules, & Regulations: All patients are expected to follow all [address] and [employer], Rules, [place] [employer]. Ignorance of the Laws does not constitute a valid excuse.  Illegal drugs and Controlled Substances: The use of illegal substances (including, but not limited to marijuana and its derivatives) and/or the illegal use of any controlled substances is strictly prohibited. Violation of this rule may result in the immediate and permanent discontinuation of any and all prescriptions being written by our practice. The use of any illegal substances is prohibited. Adopted CDC guidelines & recommendations: Target dosing levels will be at or below 60 MME/day. Use of benzodiazepines** is not recommended. Urine Drug testing: Patients taking controlled substances will be required to provide a urine sample upon request. Do not void before coming to your medication management appointments. Hold emptying your bladder until you are admitted. The admitting nurse will inform you if a sample is required. Our practice reserves the right to call you at any time to provide a sample. Once receiving the call, you have 24 hours to comply with request. Not providing a sample upon request may result in termination of medication therapy.  Exceptions: There are only two exceptions to the rule of not receiving pain medications from other Healthcare Providers. Exception #1 (Emergencies): In the event of an emergency (i.e.: accident requiring emergency care), you are allowed to receive additional pain medication. However, you are  responsible for: As soon as you are able, call our office [phone], at any time of the day or night, and leave a message stating your name, the date and nature of the emergency, and the name and dose of the medication prescribed. In the event that your call is answered by a member of our staff, make sure to document and save the date, time, and the name of the person that took your information.  Exception #2 (Planned Surgery): In the event that you are scheduled by another doctor or dentist to have any type of surgery or procedure, you are allowed (for a period no longer than 30 days), to receive additional pain medication, for the acute post-op pain. However, in this case, you are responsible for picking up a copy of our Post-op Pain Management for Surgeons handout, and giving it to your surgeon or dentist. This document is available at our office, and does not require an appointment to obtain it. Simply go to our office during business hours (Monday-Thursday from 8:00 AM to 4:00 PM) (Friday 8:00 AM to 12:00 Noon) or if you have a scheduled appointment with us , prior to your surgery,  and ask for it by name. In addition, you are responsible for: calling our office [phone], at any time of the day or night, and leaving a message stating your name, name of your surgeon, type of surgery, and date of procedure or surgery. Failure to comply with your responsibilities may result in termination of therapy involving the controlled substances.  Consequences:  Non-compliance with the above rules may result in permanent discontinuation of medication prescription therapy. All patients receiving any type of controlled substance is expected to comply with the above patient responsibilities. Not doing so may result in permanent discontinuation of medication prescription therapy. Medication Agreement Violation. Following the above rules, including your responsibilities will help you in avoiding a Medication  Agreement Violation (Breaking your Pain Medication Contract).  *Opioid medications include: morphine, codeine, oxycodone , oxymorphone, hydrocodone , hydromorphone, meperidine, tramadol, tapentadol, buprenorphine, fentanyl , methadone. **Benzodiazepine medications include: diazepam (Valium), alprazolam  (Xanax ), clonazepam (Klonopine), lorazepam (Ativan), clorazepate (Tranxene), chlordiazepoxide (Librium), estazolam (Prosom), oxazepam (Serax), temazepam (Restoril), triazolam (Halcion) (Last updated: 05/14/2023) ______________________________________________________________________     ______________________________________________________________________    Medication Recommendations and Reminders  Applies to: All patients receiving prescriptions (written and/or electronic).  Medication Rules & Regulations: You are responsible for reading, knowing, and following our Medication Rules document. These exist for your safety and that of others. They are not flexible and neither are we. Dismissing or ignoring them is an act of non-compliance that may result in complete and irreversible termination of such medication therapy. For safety reasons, non-compliance will not be tolerated. As with the U.S. fundamental legal principle of ignorance of the law is no defense, we will accept no excuses for not having read and knowing the content of documents provided to you by our practice.  Pharmacy of record:  Definition: This is the pharmacy where your electronic prescriptions will be sent.  We do not endorse any particular pharmacy. It is up to you and your insurance to decide what pharmacy to use.  We do not restrict you in your choice of pharmacy. However, once we write for your prescriptions, we will NOT be re-sending more prescriptions to fix restricted supply problems created by your pharmacy, or your insurance.  The pharmacy listed in the electronic medical record should be the one where you want  electronic prescriptions to be sent. If you choose to change pharmacy, simply notify our nursing staff. Changes will be made only during your regular appointments and not over the phone.  Recommendations: Keep all of your pain medications in a safe place, under lock and key, even if you live alone. We will NOT replace lost, stolen, or damaged medication. We do not accept Police Reports as proof of medications having been stolen. After you fill your prescription, take 1 week's worth of pills and put them away in a safe place. You should keep a separate, properly labeled bottle for this purpose. The remainder should be kept in the original bottle. Use this as your primary supply, until it runs out. Once it's gone, then you know that you have 1 week's worth of medicine, and it is time to come in for a prescription refill. If you do this correctly, it is unlikely that you will ever run out of medicine. To make sure that the above recommendation works, it is very important that you make sure your medication refill appointments are scheduled at least 1 week before you run out of medicine. To do this in an effective manner, make sure that you do not leave the office  without scheduling your next medication management appointment. Always ask the nursing staff to show you in your prescription , when your medication will be running out. Then arrange for the receptionist to get you a return appointment, at least 7 days before you run out of medicine. Do not wait until you have 1 or 2 pills left, to come in. This is very poor planning and does not take into consideration that we may need to cancel appointments due to bad weather, sickness, or emergencies affecting our staff. DO NOT ACCEPT A Partial Fill: If for any reason your pharmacy does not have enough pills/tablets to completely fill or refill your prescription, do not allow for a partial fill. The law allows the pharmacy to complete that prescription within 72  hours, without requiring a new prescription. If they do not fill the rest of your prescription within those 72 hours, you will need a separate prescription to fill the remaining amount, which we will NOT provide. If the reason for the partial fill is your insurance, you will need to talk to the pharmacist about payment alternatives for the remaining tablets, but again, DO NOT ACCEPT A PARTIAL FILL, unless you can trust your pharmacist to obtain the remainder of the pills within 72 hours.  Prescription refills and/or changes in medication(s):  Prescription refills, and/or changes in dose or medication, will be conducted only during scheduled medication management appointments. (Applies to both, written and electronic prescriptions.) No refills on procedure days. No medication will be changed or started on procedure days. No changes, adjustments, and/or refills will be conducted on a procedure day. Doing so will interfere with the diagnostic portion of the procedure. No phone refills. No medications will be called into the pharmacy. No Fax refills. No weekend refills. No Holliday refills. No after hours refills.  Remember:  Business hours are:  Monday to Thursday 8:00 AM to 4:00 PM Provider's Schedule: Eric Como, MD - Appointments are:  Medication management: Monday and Wednesday 8:00 AM to 4:00 PM Procedure day: Tuesday and Thursday 7:30 AM to 4:00 PM Wallie Sherry, MD - Appointments are:  Medication management: Tuesday and Thursday 8:00 AM to 4:00 PM Procedure day: Monday and Wednesday 7:30 AM to 4:00 PM (Last update: 05/14/2022) ______________________________________________________________________     ______________________________________________________________________    National Pain Medication Shortage  The [place] is experiencing worsening drug shortages. These have had a negative widespread effect on patient care and treatment. Not expected to improve any time soon.  Predicted to last past 2029.   Drug shortage list (generic names) Oxycodone  IR Oxycodone /APAP Oxymorphone IR Hydromorphone Hydrocodone /APAP Morphine  Where is the problem?  Manufacturing and supply level.  Will this shortage affect you?  Only if you take any of the above pain medications.  How? You may be unable to fill your prescription.  Your pharmacist may offer a partial fill of your prescription. (Warning: Do not accept partial fills.) Prescriptions partially filled cannot be transferred to another pharmacy. Read our Medication Rules and Regulation. Depending on how much medicine you are dependent on, you may experience withdrawals when unable to get the medication.  Recommendations: Consider ending your dependence on opioid pain medications. Ask your pain specialist to assist you with the process. Consider switching to a medication currently not in shortage, such as Buprenorphine. Talk to your pain specialist about this option. Consider decreasing your pain medication requirements by managing tolerance thru Drug Holidays. This may help minimize withdrawals, should you run out of medicine. Control your pain  thru the use of non-pharmacological interventional therapies.   Your prescriber: Prescribers cannot be blamed for shortages. Medication manufacturing and supply issues cannot be fixed by the prescriber.   NOTE: The prescriber is not responsible for supplying the medication, or solving supply issues. Work with your pharmacist to solve it. The patient is responsible for the decision to take or continue taking the medication and for identifying and securing a legal supply source. By law, supplying the medication is the job and responsibility of the pharmacy. The prescriber is responsible for the evaluation, monitoring, and prescribing of these medications.   Prescribers will NOT: Re-issue prescriptions that have been partially filled. Re-issue prescriptions already sent to  a pharmacy.  Re-send prescriptions to a different pharmacy because yours did not have your medication. Ask pharmacist to order more medicine or transfer the prescription to another pharmacy. (Read below.)  New 2023 regulation: March 22, 2022 Revised Regulation Allows DEA-Registered Pharmacies to Transfer Electronic Prescriptions at a Patients Request DEA Headquarters Division - Public Information Office Patients now have the ability to request their electronic prescription be transferred to another pharmacy without having to go back to their practitioner to initiate the request. This revised regulation went into effect on Monday, March 18, 2022.     At a patients request, a DEA-registered retail pharmacy can now transfer an electronic prescription for a controlled substance (schedules II-V) to another DEA-registered retail pharmacy. Prior to this change, patients would have to go through their practitioner to cancel their prescription and have it re-issued to a different pharmacy. The process was taxing and time consuming for both patients and practitioners.    The Drug Enforcement Administration [facility]) published its intent to revise the process for transferring electronic prescriptions on June 09, 2020.  The final rule was published in the federal register on February 14, 2022 and went into effect 30 days later.  Under the final rule, a prescription can only be transferred once between pharmacies, and only if allowed under existing state or other applicable law. The prescription must remain in its electronic form; may not be altered in any way; and the transfer must be communicated directly between two licensed pharmacists. Its important to note, any authorized refills transfer with the original prescription, which means the entire prescription will be filled at the same pharmacy.   Reference: hugehand.is [facility] website announcement)  Cheapwipes.at.pdf [employer])   [employer] / Vol. 88, No. 143 / Thursday, February 14, 2022 / Rules and Regulations DEPARTMENT OF JUSTICE  Drug Enforcement Administration  21 CFR Part 1306  [Docket No. [id number]]  RIN [id number] Transfer of Electronic Prescriptions for Schedules II-V Controlled Substances Between Pharmacies for Initial Filling  ______________________________________________________________________       ______________________________________________________________________    Transfer of Pain Medication between Pharmacies  Re: 2023 DEA Clarification on existing regulation  Published on DEA Website: March 22, 2022  Title: Revised Regulation Allows DEA-Registered Pharmacies to Electrical Engineer Prescriptions at a Patients Request DEA Headquarters Division - [employer]  Patients now have the ability to request their electronic prescription be transferred to another pharmacy without having to go back to their practitioner to initiate the request. This revised regulation went into effect on Monday, March 18, 2022.     At a patients request, a DEA-registered retail pharmacy can now transfer an electronic prescription for a controlled substance (schedules II-V) to another DEA-registered retail pharmacy. Prior to this change, patients would have to go through their practitioner  to cancel their prescription and have it re-issued to a different pharmacy. The process was taxing and time consuming for both patients and practitioners.    The Drug Enforcement Administration [facility]) published its intent to revise the process for transferring electronic prescriptions on June 09, 2020.  The final rule was published in the  federal register on February 14, 2022 and went into effect 30 days later.  Under the final rule, a prescription can only be transferred once between pharmacies, and only if allowed under existing state or other applicable law. The prescription must remain in its electronic form; may not be altered in any way; and the transfer must be communicated directly between two licensed pharmacists. Its important to note, any authorized refills transfer with the original prescription, which means the entire prescription will be filled at the same pharmacy.    REFERENCES: 1. DEA website announcement hugehand.is  2. Department of Justice website  Cheapwipes.at.pdf  3. DEPARTMENT OF JUSTICE Drug Enforcement Administration 21 CFR Part 1306 [Docket No. [id number]] RIN [id number] Transfer of Electronic Prescriptions for Schedules II-V Controlled Substances Between Pharmacies for Initial Filling  ______________________________________________________________________       ______________________________________________________________________    Medication Transfer   Notification You are currently compliant and stable on your pain medication regimen. This regimen will be transferred today to your Primary Care Provider (PCP). You will be provided with enough prescriptions to last for 90 days. After that, your prescriptions will need to be taken over by your PCP.  Recommendation Immediately contact your primary care provider to secure an appointment for evaluation before this period is over. Do not wait until the last month to contact them.   Clarification The transfer of your medication regimen does not mean that you are being discharged from our clinic. We will remain available to you for any consultation or interventional therapies you may need.    Alternative Should you decide not to continue taking these medication and would like assistance in permanently stopping them, please let us  know so that we can design a slow tapering down of your regimen.  Reason Our primary responsibility to provide specialized interventional pain management therapies otherwise not available to the community. We have in the past assisted primary care providers with reviewing and adjusting pain medication management therapies, however, we have been transparent to all patients and referring providers that it is not our intention to permanently take over this type of therapy. Transfer of this portion of your care will assist us  in freeing time to assist others in need of our specialty services.   ______________________________________________________________________      ______________________________________________________________________    WARNING: CBD (cannabidiol) & Delta (Delta-8 tetrahydrocannabinol) products.   Applicable to:  All individuals currently taking or considering taking CBD (cannabidiol) and, more important, all patients taking opioid analgesic controlled substances (pain medication). (Example: oxycodone ; oxymorphone; hydrocodone ; hydromorphone; morphine; methadone; tramadol; tapentadol; fentanyl ; buprenorphine; butorphanol; dextromethorphan; meperidine; codeine; etc.)  Introduction:  Recently there has been a drive towards the use of natural products for the treatment of different conditions, including pain anxiety and sleep disorders. Marijuana and hemp are two varieties of the cannabis genus plants. Marijuana and its derivatives are illegal, while hemp and its derivatives are not. Cannabidiol (CBD) and tetrahydrocannabinol (THC), are two natural compounds found in plants of the Cannabis genus. They can both be extracted from hemp or marijuana. Both compounds interact with your bodys endocannabinoid system in very different ways. CBD is  associated with pain relief (analgesia) while THC  is associated with the psychoactive effects (the high) obtained from the use of marijuana products. There are two main types of THC: Delta-9, which comes from the marijuana plant and it is illegal, and Delta-8, which comes from the hemp plant, and it is legal. (Both, Delta-9-THC and Delta-8-THC are psychoactive and give you the high.)   Legality:  Marijuana and its derivatives: illegal Hemp and its derivatives: Legal (State dependent) UPDATE: (09/07/2021) The Drug Enforcement Agency (DEA) issued a letter stating that delta cannabinoids, including Delta-8-THCO and Delta-9-THCO, synthetically derived from hemp do not qualify as hemp and will be viewed as Schedule I drugs. (Schedule I drugs, substances, or chemicals are defined as drugs with no currently accepted medical use and a high potential for abuse. Some examples of Schedule I drugs are: heroin, lysergic acid diethylamide (LSD), marijuana (cannabis), 3,4-methylenedioxymethamphetamine (ecstasy), methaqualone, and peyote.) (cuetune.com.ee)  Legal status of CBD in [place]:  Conditionally Legal  Reference: FDA Regulation of Cannabis and Cannabis-Derived Products, Including Cannabidiol (CBD) - oemdeals.dk  Warning:  CBD is not FDA approved and has not undergo the same manufacturing controls as prescription drugs.  This means that the purity and safety of available CBD may be questionable. Most of the time, despite manufacturer's claims, it is contaminated with THC (delta-9-tetrahydrocannabinol - the chemical in marijuana responsible for the HIGH).  When this is the case, the [facility] contaminant will trigger a positive urine drug screen (UDS) test for Marijuana (carboxy-THC).   The FDA recently put out a warning about 5 things that everyone should be aware of regarding Delta-8  THC: Delta-8 THC products have not been evaluated or approved by the FDA for safe use and may be marketed in ways that put the public health at risk. The FDA has received adverse event reports involving delta-8 THC-containing products. Delta-8 THC has psychoactive and intoxicating effects. Delta-8 THC manufacturing often involve use of potentially harmful chemicals to create the concentrations of delta-8 THC claimed in the marketplace. The final delta-8 THC product may have potentially harmful by-products (contaminants) due to the chemicals used in the process. Manufacturing of delta-8 THC products may occur in uncontrolled or unsanitary settings, which may lead to the presence of unsafe contaminants or other potentially harmful substances. Delta-8 THC products should be kept out of the reach of children and pets.  NOTE: Because a positive UDS for any illicit substance is a violation of our medication agreement, your opioid analgesics (pain medicine) may be permanently discontinued.  MORE ABOUT CBD  General Information: CBD was discovered in [birth year] and it is a derivative of the cannabis sativa genus plants (Marijuana and Hemp). It is one of the 113 identified substances found in Marijuana. It accounts for up to 40% of the plant's extract. As of 2018, preliminary clinical studies on CBD included research for the treatment of anxiety, movement disorders, and pain. CBD is available and consumed in multiple forms, including inhalation of smoke or vapor, as an aerosol spray, and by mouth. It may be supplied as an oil containing CBD, capsules, dried cannabis, or as a liquid solution. CBD is thought not to be as psychoactive as THC (delta-9-tetrahydrocannabinol - the chemical in marijuana responsible for the HIGH). Studies suggest that CBD may interact with different biological target receptors in the body, including cannabinoid and other neurotransmitter receptors. As of 2018 the mechanism of action for its  biological effects has not been determined.  Side-effects  Adverse reactions: Dry mouth, diarrhea, decreased appetite, fatigue, drowsiness, malaise, weakness, sleep disturbances, and  others.  Drug interactions:  CBD may interact with medications such as blood-thinners. CBD causes drowsiness on its own and it will increase drowsiness caused by other medications, including antihistamines (such as Benadryl), benzodiazepines (Xanax , Ativan, Valium), antipsychotics, antidepressants, opioids, alcohol  and supplements such as kava, melatonin and St. John's Wort.  Other drug interactions: Brivaracetam (Briviact); Caffeine; Carbamazepine (Tegretol); Citalopram (Celexa); Clobazam (Onfi); Eslicarbazepine (Aptiom); Everolimus (Zostress); Lithium; Methadone (Dolophine); Rufinamide (Banzel); Sedative medications (CNS depressants); Sirolimus (Rapamune); Stiripentol (Diacomit); Tacrolimus (Prograf); Tamoxifen ; Soltamox); Topiramate (Topamax); Valproate; Warfarin (Coumadin ); Zonisamide. (Last update: 07/01/2022) ______________________________________________________________________     ______________________________________________________________________    Muscle Spasms & Cramps  Cause(s):  Most common - vitamin and/or electrolyte (calcium , potassium, sodium, etc.) deficiencies. Post procedure - steroids (injected, oral, or inhaled) can make your kidneys excrete (loose) electrolytes. Most of the time this will not cause any symptoms however, if you happen to be borderline low on your electrolytes, it may temporarily triggering cramps & spasms.  Possible triggers: Sweating - causes loss of electrolytes thru the skin. Steroids - causes loss of electrolytes thru the urine.  Treatment: (over-the-counter)  Gatorade (or any other electrolyte-replenishing drink) - Take 1, 8 oz glass with each meal (3 times a day). Mechanism of action: Replenishes lost electrolytes. Magnesium  400 to 500 mg - Take 1 tablet twice a  day (one with breakfast and one at bedtime). If you have kidney disease talk to your primary care physician before taking any Magnesium . Mechanism of action: Magnesium  is a natural muscle relaxant. Tonic Water with quinine - Take 1, 8 oz glass before bedtime.  Mechanism of action: Quinine is used to treat spasms.  Last Update: 01/30/2023  ______________________________________________________________________     ______________________________________________________________________    Appointment Information  It is our goal and responsibility to provide the medical community with assistance in the evaluation and management of patients with chronic pain. Unfortunately our resources are limited. Because we do not have an unlimited amount of time, or available appointments, we are required to closely monitor for unkept or cancelled appointments.  Patient's responsibilities: 1. Punctuality: Patients are required to be physically present in our office at least 15 minutes before their scheduled appointment. 2. Tardiness: Patients not physically present in our office at their scheduled appointment time will be rescheduled. 3. Plan ahead: Assume that you will encounter traffic and plan to arrive 30 minutes before your appointment. 4. Other appointments and responsibilities: Do not schedule other appointments immediately before or after your scheduled appointment.  5. Be prepared: Make a list of everything that you need to discuss with your provider so that you use your time efficiently. Once the provider leaves your room, he/she will not return to your room to discuss anything that you neglected to bring up during your allowed time. 6. No children or pets: Do not bring children or pets to your appointment. 7. Cancelling or rescheduling your appointment: Advanced notification (more than 24 hours in advance) is required. 8. No Show: Not calling to cancel an appointment and simply not showing up is  unacceptable. This leads to loss of appointments that could have been used by a patient in need. (See below)  Corrective process for repeat offenders:  No Shows: Three (3) No Shows within a 12 month period will result in an automatic discharge from our practice. Rescheduling or cancelling with more than 24 hours notice will not be penalized and will not count against you. Tardiness: If you have to be rescheduled three (3) times due to late arrivals,  it will be counted as one (1) No Show. Cancellation or reschedule: Three (3) cancellations or rescheduling where notice was given with less than 24 hours in advance, will be recorded as one (1) No Show.  Types of appointments: New patient initial evaluation: These are evaluations only. Your initial patient questionnaire will be collected and entered into the system. A history of present illness will be taken. Prior lab work, imaging studies, and associated treatments will be reviewed. The provider may order appropriate diagnostic testing depending on their evaluation and review of available information. No treatments will be started on this visit. 2nd Follow-up visit: During this visit your provider will inform you of the results of the diagnostic tests ordered on the initial evaluation. Based on the providers assessment, treatment options will be offered, at which the patient will decide if he/she is interested in the alternatives. If interested, a treatment plan will be established and started. Procedure visits: Post-procedure evaluation visits: Evaluation visits MM New problems Flare-up evaluations Follow-up after diagnostic testing ______________________________________________________________________

## 2024-07-08 NOTE — Progress Notes (Signed)
 Nursing Pain Medication Assessment:  Safety precautions to be maintained throughout the outpatient stay will include: orient to surroundings, keep bed in low position, maintain call bell within reach at all times, provide assistance with transfer out of bed and ambulation.  Medication Inspection Compliance: Pill count conducted under aseptic conditions, in front of the patient. Neither the pills nor the bottle was removed from the patient's sight at any time. Once count was completed pills were immediately returned to the patient in their original bottle.  Medication: Hydromorphone  (Dilaudid ) Pill/Patch Count: 46 of 90 pills/patches remain Pill/Patch Appearance: Markings consistent with prescribed medication Bottle Appearance: Standard pharmacy container. Clearly labeled. Filled Date: 69 / 02 / 2025 Last Medication intake:  Today

## 2024-09-28 ENCOUNTER — Encounter: Payer: Medicare (Managed Care) | Admitting: Nurse Practitioner
